# Patient Record
Sex: Female | Born: 1935 | Race: White | Hispanic: No | Marital: Married | Smoking: Never smoker
Health system: Southern US, Community
[De-identification: ages and names within clinical notes are randomized; demographics above are authoritative.]

## PROBLEM LIST (undated history)

## (undated) DIAGNOSIS — E7800 Pure hypercholesterolemia, unspecified: Secondary | ICD-10-CM

## (undated) DIAGNOSIS — E119 Type 2 diabetes mellitus without complications: Secondary | ICD-10-CM

## (undated) DIAGNOSIS — I10 Essential (primary) hypertension: Secondary | ICD-10-CM

## (undated) DIAGNOSIS — M1990 Unspecified osteoarthritis, unspecified site: Secondary | ICD-10-CM

## (undated) DIAGNOSIS — K219 Gastro-esophageal reflux disease without esophagitis: Secondary | ICD-10-CM

## (undated) DIAGNOSIS — C4491 Basal cell carcinoma of skin, unspecified: Secondary | ICD-10-CM

## (undated) DIAGNOSIS — N189 Chronic kidney disease, unspecified: Secondary | ICD-10-CM

## (undated) HISTORY — PX: ABDOMINAL HYSTERECTOMY: SHX81

## (undated) HISTORY — DX: Basal cell carcinoma of skin, unspecified: C44.91

## (undated) HISTORY — DX: Type 2 diabetes mellitus without complications: E11.9

## (undated) HISTORY — PX: TOTAL KNEE ARTHROPLASTY: SHX125

## (undated) HISTORY — PX: BACK SURGERY: SHX140

## (undated) HISTORY — PX: CHOLECYSTECTOMY: SHX55

## (undated) HISTORY — DX: Unspecified osteoarthritis, unspecified site: M19.90

## (undated) HISTORY — DX: Chronic kidney disease, unspecified: N18.9

## (undated) HISTORY — DX: Gastro-esophageal reflux disease without esophagitis: K21.9

---
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
## 1999-11-30 ENCOUNTER — Encounter: Admission: RE | Admit: 1999-11-30 | Discharge: 1999-11-30 | Payer: Self-pay | Admitting: Family Medicine

## 1999-11-30 ENCOUNTER — Encounter: Payer: Self-pay | Admitting: Family Medicine

## 1999-12-18 ENCOUNTER — Encounter: Payer: Self-pay | Admitting: *Deleted

## 1999-12-22 ENCOUNTER — Ambulatory Visit (HOSPITAL_COMMUNITY): Admission: RE | Admit: 1999-12-22 | Discharge: 1999-12-23 | Payer: Self-pay | Admitting: *Deleted

## 2000-03-07 ENCOUNTER — Encounter: Admission: RE | Admit: 2000-03-07 | Discharge: 2000-03-07 | Payer: Self-pay | Admitting: Obstetrics and Gynecology

## 2000-03-07 ENCOUNTER — Encounter: Payer: Self-pay | Admitting: Obstetrics and Gynecology

## 2001-03-08 ENCOUNTER — Encounter: Payer: Self-pay | Admitting: Obstetrics and Gynecology

## 2001-03-08 ENCOUNTER — Encounter: Admission: RE | Admit: 2001-03-08 | Discharge: 2001-03-08 | Payer: Self-pay | Admitting: Obstetrics and Gynecology

## 2002-02-07 ENCOUNTER — Encounter: Payer: Self-pay | Admitting: Neurosurgery

## 2002-02-07 ENCOUNTER — Ambulatory Visit (HOSPITAL_COMMUNITY): Admission: RE | Admit: 2002-02-07 | Discharge: 2002-02-07 | Payer: Self-pay | Admitting: Neurosurgery

## 2002-03-12 ENCOUNTER — Encounter: Admission: RE | Admit: 2002-03-12 | Discharge: 2002-03-12 | Payer: Self-pay | Admitting: Obstetrics and Gynecology

## 2002-03-12 ENCOUNTER — Encounter: Payer: Self-pay | Admitting: Obstetrics and Gynecology

## 2003-03-15 ENCOUNTER — Encounter: Payer: Self-pay | Admitting: Obstetrics and Gynecology

## 2003-03-15 ENCOUNTER — Encounter: Admission: RE | Admit: 2003-03-15 | Discharge: 2003-03-15 | Payer: Self-pay | Admitting: Obstetrics and Gynecology

## 2004-03-16 ENCOUNTER — Encounter: Admission: RE | Admit: 2004-03-16 | Discharge: 2004-03-16 | Payer: Self-pay | Admitting: Family Medicine

## 2005-03-17 ENCOUNTER — Encounter: Admission: RE | Admit: 2005-03-17 | Discharge: 2005-03-17 | Payer: Self-pay | Admitting: Family Medicine

## 2005-11-05 ENCOUNTER — Encounter: Admission: RE | Admit: 2005-11-05 | Discharge: 2005-11-05 | Payer: Self-pay | Admitting: Family Medicine

## 2006-03-28 ENCOUNTER — Encounter: Admission: RE | Admit: 2006-03-28 | Discharge: 2006-03-28 | Payer: Self-pay | Admitting: Family Medicine

## 2006-07-28 ENCOUNTER — Encounter: Admission: RE | Admit: 2006-07-28 | Discharge: 2006-07-28 | Payer: Self-pay | Admitting: Family Medicine

## 2006-10-03 ENCOUNTER — Inpatient Hospital Stay (HOSPITAL_COMMUNITY): Admission: RE | Admit: 2006-10-03 | Discharge: 2006-10-06 | Payer: Self-pay | Admitting: Orthopedic Surgery

## 2007-03-30 ENCOUNTER — Encounter: Admission: RE | Admit: 2007-03-30 | Discharge: 2007-03-30 | Payer: Self-pay | Admitting: Family Medicine

## 2008-02-22 HISTORY — PX: COLONOSCOPY: SHX174

## 2008-04-01 ENCOUNTER — Encounter: Admission: RE | Admit: 2008-04-01 | Discharge: 2008-04-01 | Payer: Self-pay | Admitting: Family Medicine

## 2008-04-27 ENCOUNTER — Encounter: Admission: RE | Admit: 2008-04-27 | Discharge: 2008-04-27 | Payer: Self-pay | Admitting: Neurosurgery

## 2009-01-29 ENCOUNTER — Inpatient Hospital Stay (HOSPITAL_COMMUNITY): Admission: RE | Admit: 2009-01-29 | Discharge: 2009-01-31 | Payer: Self-pay | Admitting: Neurosurgery

## 2009-04-03 ENCOUNTER — Encounter: Admission: RE | Admit: 2009-04-03 | Discharge: 2009-04-03 | Payer: Self-pay | Admitting: Obstetrics and Gynecology

## 2010-04-06 ENCOUNTER — Encounter: Admission: RE | Admit: 2010-04-06 | Discharge: 2010-04-06 | Payer: Self-pay | Admitting: Family Medicine

## 2010-10-25 LAB — URINALYSIS, ROUTINE W REFLEX MICROSCOPIC
Bilirubin Urine: NEGATIVE
Glucose, UA: NEGATIVE mg/dL
Hgb urine dipstick: NEGATIVE
Ketones, ur: NEGATIVE mg/dL
Nitrite: NEGATIVE
Protein, ur: NEGATIVE mg/dL
Specific Gravity, Urine: 1.016 (ref 1.005–1.030)
Urobilinogen, UA: 1 mg/dL (ref 0.0–1.0)
pH: 6 (ref 5.0–8.0)

## 2010-10-25 LAB — COMPREHENSIVE METABOLIC PANEL
ALT: 22 U/L (ref 0–35)
AST: 30 U/L (ref 0–37)
Albumin: 4 g/dL (ref 3.5–5.2)
Alkaline Phosphatase: 56 U/L (ref 39–117)
BUN: 26 mg/dL — ABNORMAL HIGH (ref 6–23)
CO2: 23 mEq/L (ref 19–32)
Calcium: 9.8 mg/dL (ref 8.4–10.5)
Chloride: 107 mEq/L (ref 96–112)
Creatinine, Ser: 1.51 mg/dL — ABNORMAL HIGH (ref 0.4–1.2)
GFR calc Af Amer: 41 mL/min — ABNORMAL LOW (ref >60)
GFR calc non Af Amer: 34 mL/min — ABNORMAL LOW (ref >60)
Glucose, Bld: 135 mg/dL — ABNORMAL HIGH (ref 70–99)
Potassium: 4.6 mEq/L (ref 3.5–5.1)
Sodium: 138 mEq/L (ref 135–145)
Total Bilirubin: 0.6 mg/dL (ref 0.3–1.2)
Total Protein: 6.6 g/dL (ref 6.0–8.3)

## 2010-10-25 LAB — CBC
HCT: 35 % — ABNORMAL LOW (ref 36.0–46.0)
Hemoglobin: 12.3 g/dL (ref 12.0–15.0)
MCHC: 35 g/dL (ref 30.0–36.0)
MCV: 92.4 fL (ref 78.0–100.0)
Platelets: 202 10*3/uL (ref 150–400)
RBC: 3.79 MIL/uL — ABNORMAL LOW (ref 3.87–5.11)
RDW: 12.7 % (ref 11.5–15.5)
WBC: 7 10*3/uL (ref 4.0–10.5)

## 2010-10-25 LAB — PROTIME-INR
INR: 0.9 (ref 0.00–1.49)
Prothrombin Time: 12.6 seconds (ref 11.6–15.2)

## 2010-10-25 LAB — BASIC METABOLIC PANEL
BUN: 30 mg/dL — ABNORMAL HIGH (ref 6–23)
CO2: 22 mEq/L (ref 19–32)
Calcium: 9.5 mg/dL (ref 8.4–10.5)
Chloride: 103 mEq/L (ref 96–112)
Creatinine, Ser: 1.45 mg/dL — ABNORMAL HIGH (ref 0.4–1.2)
GFR calc Af Amer: 43 mL/min — ABNORMAL LOW (ref >60)
GFR calc non Af Amer: 35 mL/min — ABNORMAL LOW (ref >60)
Glucose, Bld: 117 mg/dL — ABNORMAL HIGH (ref 70–99)
Potassium: 4.7 mEq/L (ref 3.5–5.1)
Sodium: 135 mEq/L (ref 135–145)

## 2010-10-25 LAB — DIFFERENTIAL
Basophils Absolute: 0 10*3/uL (ref 0.0–0.1)
Basophils Relative: 1 % (ref 0–1)
Eosinophils Absolute: 0.1 10*3/uL (ref 0.0–0.7)
Eosinophils Relative: 1 % (ref 0–5)
Lymphocytes Relative: 32 % (ref 12–46)
Lymphs Abs: 2.2 10*3/uL (ref 0.7–4.0)
Monocytes Absolute: 0.5 10*3/uL (ref 0.1–1.0)
Monocytes Relative: 8 % (ref 3–12)
Neutro Abs: 4.1 10*3/uL (ref 1.7–7.7)
Neutrophils Relative %: 58 % (ref 43–77)

## 2010-10-25 LAB — ABO/RH: ABO/RH(D): O POS

## 2010-10-25 LAB — TYPE AND SCREEN
ABO/RH(D): O POS
Antibody Screen: NEGATIVE

## 2010-10-25 LAB — APTT: aPTT: 29 seconds (ref 24–37)

## 2010-12-01 NOTE — Op Note (Signed)
NAMECJ, Tabitha Black                 ACCOUNT NO.:  [id number]   MEDICAL RECORD NO.:  [id number]          PATIENT TYPE:  INP   LOCATION:  2899                         FACILITY:  [facility]   PHYSICIAN:  Elizabeth Sauer, M.D.      DATE OF BIRTH:  [birth year]   DATE OF PROCEDURE:  01/29/2009  DATE OF DISCHARGE:                               OPERATIVE REPORT   PREOPERATIVE DIAGNOSES:  Spondylosis and spinal stenosis, L3-4.   POSTOPERATIVE DIAGNOSES:  Spondylosis and spinal stenosis, L3-4.   OPERATIVE PROCEDURE:  L3-4 anterolateral interbody fusion with XLIF  technique and lateral plate.   SURGEON:  Elizabeth Sauer, MD, Neurosurgery.   ANESTHESIA:  General endotracheal.   PREPARATION:  Prepped and scrubbed with alcohol wipe.   COMPLICATIONS:  None.   NURSE ASSISTANT:  Kasik.   DOCTOR ASSISTANT:  Leeroy Cha, MD   BODY OF TEXT:  A 77 year old lady with adjacent segment disease at L3-4,  taken to operating room, smoothly sized intubated, placed right side and  down left side up in lateral position.  The patient was secured to the  table with table flexed and the patient flexed laterally.  Good view of  the L3-4 interspace was obtained.  Following shave, prep and drape in  usual sterile fashion, 2 lateral incisions were made, 1 directly lateral  of the L3-4 disk space and 1 slightly posterior, through these the  retroperitoneal space was accessed.  The posterior wound was used for  finger palpation down to the transverse process, the far lateral one was  used as a working channel through which the electrode was placed.  The  docking site was selected, EMG monitoring was undertaken and no elements  of the plexus were encountered.  Successive dilation was carried out to  the placement of a large retractor, which was secured in place with arm.  Light sources were placed and retractor opened transversely in a  cephalocaudal direction.  The psoas muscle was gently split and pushed  out of the way.   Disk was encountered, the shim placed.  Diskectomy was  carried out under gross observation and fluoroscopic observation.  Lateral recess on the right side was obtained with Cobb curettes.  Following complete diskectomy and preparation of endplate, an 8 x 55-mm  implant was placed with Osteocel.  Screws or bolts were placed under  fluoro control into this, a lateral plate was attached and secured  with locking nuts.  Films showed good placement of all elements.  The  wound was irrigated.  Hemostasis assured.  Successive layers of 0  Vicryl, 2-0 Vicryl, and 3-0 nylon were used to close each incision.  The  patient was then placed, returned recovery room in good condition.           ______________________________  Elizabeth Sauer, M.D.     MWR/MEDQ  D:  01/29/2009  T:  01/30/2009  Job:  [id number]

## 2010-12-01 NOTE — H&P (Signed)
Tabitha Black, Tabitha Black                 ACCOUNT NO.:  [id number]   MEDICAL RECORD NO.:  [id number]          PATIENT TYPE:  INP   LOCATION:  3039                         FACILITY:  [facility]   PHYSICIAN:  Elizabeth Sauer, M.D.      DATE OF BIRTH:  [birth year]   DATE OF ADMISSION:  01/29/2009  DATE OF DISCHARGE:                              HISTORY & PHYSICAL   ADMISSION DIAGNOSES:  Spondylosis and adjacent segment disease at L3-4.   BODY TEXT:  A very nice 77 year old right-handed white lady who had  lumbar fusion about 10 years ago at L4-L5, did well, has developed  increasing pain in her back.  MR demonstrates significant adjacent  segment disease at L3-4, and she is now admitted for anterolateral  interbody fusion using XLIF technique.   MEDICAL HISTORY:  Remarkable for hypertension.   ALLERGIES:  She is allergic to PENICILLIN and TOPICAL MYCINS.   MEDICATIONS:  She is on several antihypertensive medications.   Surgical history has been her back.  She has also had her foot and  hysterectomy.   SOCIAL HISTORY:  She does not smoke, does not drink.  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   FAMILY HISTORY:  Noncontributory.   REVIEW OF SYSTEMS:  Remarkable for back and leg pain.   PHYSICAL EXAMINATION:  HEENT:  Within normal limits.  NECK:  She had good range of motion in her neck.  CHEST:  Clear.  CARDIAC:  Regular rate and rhythm.  NEUROLOGIC:  She is awake, alert, and oriented.  Cranial nerves are  intact.  Motor exam shows 5/5 strength throughout the upper and lower  extremities.  She develops subjective weakness in her legs when she has  been up on them  walking.  Reflexes are flicker at the knees, absent at  the ankles.   MR shows significant spondylotic disease at L3-4.   CLINICAL IMPRESSION:  Symptomatic adjacent segment disease.   The plan is for an anterolateral interbody fusion using an XLIF  technique.  The risks and benefits have been discussed with her, and she  wishes to proceed.           ______________________________  Elizabeth Sauer, M.D.     MWR/MEDQ  D:  01/29/2009  T:  01/30/2009  Job:  [id number]

## 2010-12-04 NOTE — Discharge Summary (Signed)
NAMESIYANA, TINKLENBERG                 ACCOUNT NO.:  [id number]   MEDICAL RECORD NO.:  [id number]          PATIENT TYPE:  INP   LOCATION:  1519                         FACILITY:  [facility]   PHYSICIAN:  [name], M.D.    DATE OF BIRTH:  October 04, 1932   DATE OF ADMISSION:  10/03/2006  DATE OF DISCHARGE:  10/06/2006                               DISCHARGE SUMMARY   ADMITTING DIAGNOSES:  1. Osteoarthritis, right knee.  2. Hypertension.  3. Hypercholesterolemia.  4. Reflux disease.  5. Postmenopausal.   DISCHARGE DIAGNOSES:  1. Osteoarthritis of right knee, status post right total knee      arthroplasty.  2. Acute blood loss anemia, did not require transfusion.  3. Hypertension.  4. Hypercholesterolemia.  5. Reflux disease.  6. Postmenopausal.   PROCEDURE:  October 03, 2006, right total knee; surgeon, Dr. Wynelle Link;  assistant, Arlee Muslim, PA-C; anesthesia, general.   CONSULTS:  None.   BRIEF HISTORY:  Ms. Steve is a 77 year old female with end-stage arthritis  of her right knee with intractable pain.  She has failed nonoperative  management and now presents for a total knee arthroplasty.   LABORATORY DATA:  Preop CBC showed a hemoglobin of 12.8, hematocrit of  36.9, white count of 8.1.  Postop hemoglobin 9.9 and drifted down to  9.3, last noted H&H 9.2 and 26.4.  PT and PTT preop 12.6 and 27,  respectively.  INR 0.9.  Serial pro times were followed, last noted  PT/INR 18.1 and 1.4.  Chemistry panel on admission all within normal  limits.  Serial BMETs were followed.  Her electrolytes remained within  normal limits.  Urinalysis was negative.  Blood group type O positive.   EKG, September 27, 2006:  Normal sinus rhythm, nonspecific T-wave  abnormalities, confirmed by Dr. Minus Breeding.   Two-view chest, September 27, 2006:  No active disease   HOSPITAL COURSE:  The patient was admitted to [facility] and  tolerated procedure well, later transferred to recovery room and the  orthopedic floor, started on PCA and p.o. analgesic for pain control  following surgery.  She had a rough night on the evening of surgery.  By  the morning of day #1, was doing a little bit better.  Pain was still a  fair amount, encouraged p.o. medications.  Hemoglobin was down to 9.9,  but she was asymptomatic.  Hemovac drain was pulled at time of surgery.  She was started back on her home medications.  Blood pressure looked  good.  She was started back on her cholesterol medications.  She started  getting up out of bed with therapy.  By day #2, she was doing a little  bit better.  Pain went up, but pain was under better control.  Hemoglobin drifted down to 9.3.  She was still asymptomatic.  Dressing  was changed and incision looked good.  From a therapy standpoint, she  started getting up and doing a little bit better.  She was doing about  15 feet, but did get 25 feet of mobility, weaned over to p.o.  medications, PCA  and IVs were discontinued.  By day #3, the patient was  feeling much better.  Her back was sore, but the knee was feeling  better.  She was getting up and walking 65 and 80 feet.  Wound continued  to heal well.  She was progressing well and it was felt she would be  ready to go home.  She was discharged home later that day.   DISCHARGE PLAN:  1. The patient was discharged home on October 06, 2006.  2. Discharge diagnoses:  Please see above.  3. Discharge medications:  Percocet, Robaxin, Coumadin.  She is also      given Lovenox for 2 more days until the INR is therapeutic.  Resume      home medications.  4. Diet:  Heart-healthy, low-cholesterol diet.   ACTIVITY:  Weightbearing as tolerated, right lower extremity.  Gait  training for ambulation and ADLs.  Home health PT and home health  nursing.   FOLLOWUP:  Follow up in 2 weeks.   DISPOSITION:  Home.   CONDITION UPON DISCHARGE:  Improved.      Alexzandrew L. Dara Lords, P.A.      [name], M.D.   Electronically Signed    ALP/MEDQ  D:  10/28/2006  T:  10/28/2006  Job:  [id number]   cc:   Modena Jansky. Marisue Humble, M.D.  [id number]   Oneida Arenas MD

## 2010-12-04 NOTE — Op Note (Signed)
[place]  Patient:    Tabitha Black, Tabitha Black                        MRN: [id number] Proc. Date: 12/22/99 Adm. Date:  [id number] Disc. Date: [id number] Attending:  Lucianne Muss CC:         Cleotis Nipper, M.D.             Thomas B. March Rummage, M.D.                           Operative Report  PREOPERATIVE DIAGNOSES: 1. Chronic cholecystitis with cholelithiasis. 2. Deformed left big toenail.  POSTOPERATIVE DIAGNOSES: 1. Chronic cholecystitis with cholelithiasis. 2. Deformed left big toenail.  OPERATION: 1. Laparoscopic cholecystectomy. 2. Avulsion of left big toenail.  SURGEON:  Marcello Moores B. March Rummage, M.D.  ASSISTANT:  Ward Givens, M.D.  ANESTHESIA:  General.  ANESTHESIOLOGIST:  CRNA.  DESCRIPTION OF PROCEDURE:  The patient was taken to the operating room and placed on the table in the supine position.  After satisfactory general anesthetic with intubation, the entire abdomen was prepped and draped as a sterile field.  A small vertical infraumbilical incision was made through skin and subcutaneous tissue and midline fascia.  A finger was placed into the peritoneal cavity, and there were noted to be some adhesions in the left lower quadrant of the abdomen where the omentum was adherent to the anterior abdominal wall.  A pursestring suture of 0 Vicryl was placed.  The Hasson trocar was placed in the abdomen, and the abdomen was then insufflated to 14 mmHg pressure.  A second 10 mm trocar was placed just to the right of midline in the subxiphoid area.  Two 5 mm trocars were placed laterally.  The gallbladder was placed on traction.  The cystic duct was readily identified. The cystic duct appeared normal.  The cystic duct was doubly clipped on the remaining side, once on the gallbladder side, and divided.  The cystic artery was then exposed.  The cystic artery was doubly clipped on the remaining side, once on the gallbladder side, and  divided.  After this was divided, there was another small artery just posteriorly to this, which was also doubly clipped. This was irrigated and hemostasis obtained.  The gallbladder was then dissected from the bed using the cautery.  The gallbladder bed was irrigated and no bleeding was noted.  Because there had been slight ooze near the cystic artery, a small piece of Surgicel was placed, but there was no evidence of bleeding when the Surgicel was placed.  The gallbladder was then removed through the infraumbilical incision.  It was necessary to open the gallbladder and remove some large stones before it was passed through the fascia.  The pursestring suture at the infraumbilical incision was then tied to get good closure.  The two lateral trocars were removed under direct vision.  There was some slight bleeding from the upper trocar site, and it was cauterized as the trocar was removed.  Right upper quadrant fluid had previously been suctioned and was clear.  The abdomen was then deflated and the second 10 mm trocar removed.  Marcaine 0.25% without epinephrine had been injected in all trocar sites.  The two midline incisions were closed with running subcuticular sutures of 5-0 Vicryl, and the two lateral trocar sites were closed with single inverted simple 5-0 Vicryl sutures.  Benzoin and quarter-inch  Steri-Strips were applied by all wounds.  Dry sterile dressings were applied.  Attention was then directed to the left big toenail.  The toenail was prepped with Betadine.  It was partially loosed, and the nail was avulsed.  Some extra fibrous tissue toward the end of the nail bed was debrided.  No attempt was made to destroy the nail bed.  Surgicel was placed over the nail bed, which was really not bleeding.  The Surgicel was held in place with a two-inch Kerlix.  A dry sterile dressing was applied.  The patient seemed to tolerate both procedures well and was taken to the PACU in  satisfactory condition. DD:  12/22/99 TD:  12/24/99 Job: [id number]

## 2010-12-04 NOTE — Op Note (Signed)
[name], [name]                 ACCOUNT NO.:  [id number]   MEDICAL RECORD NO.:  [id number]          PATIENT TYPE:  INP   LOCATION:  1519                         FACILITY:  [facility]   PHYSICIAN:  [name], M.D.    DATE OF BIRTH:  Feb 27, 1933   DATE OF PROCEDURE:  10/03/2006  DATE OF DISCHARGE:                               OPERATIVE REPORT   PREOPERATIVE DIAGNOSIS:  Osteoarthritis right knee.   POSTOPERATIVE DIAGNOSIS:  Osteoarthritis right knee.   PROCEDURE:  Right total knee arthroplasty.   SURGEON:  Aluisio.   ASSISTANT:  Arlee Muslim, P.A.-C.   ANESTHESIA:  General with postop Marcaine pain pump.   ESTIMATED BLOOD LOSS:  Minimal.   DRAIN:  Hemovac x1.   TOURNIQUET:  Times 40 minutes at 300 mmHg.   COMPLICATIONS:  None.   CONDITION:  Stable to recovery.   BRIEF CLINICAL NOTE:  [name] is a 77 year old female who has end-stage  arthritis of the right knee with intractable pain.  She has failed  nonoperative management and presents now for a total knee arthroplasty.   PROCEDURE IN DETAIL:  After successful administration of general  anesthetic, a tourniquet is placed high on her right thigh and her right  lower extremity prepped and draped in the usual sterile fashion.  Extremity was wrapped in Esmarch, knee flexed, tourniquet inflated to  300 mmHg.  A midline incision was made with a 10 blade through the  subcutaneous tissue to the level of the extensor mechanism.  A fresh  blade was used to make a medial parapatellar arthrotomy.  The soft  tissue over the proximal medial tibia subperiosteally elevated to the  joint line with a knife, into the semimembranous bursa with a Cobb  elevator.  Soft tissue laterally was elevated with attention being paid  to avoid the patellar tendon on tibial tubercle.  Patella subluxed  laterally, knee flexed 90 degrees.  The ACL and PCL were removed.  A  drill was used to create a starting hole on the distal femur, canal was  thoroughly irrigated.  A 5 degree right valgus alignment guide is  placed, then referencing off the posterior condyle the rotation is  marked and the block pinned to remove 10 mm off the distal femur.  A  distal femoral resection is made with an oscillating saw.  A sizing  block is placed and size 3 is most appropriate.  Rotation is marked at  the epicondylar axis.  A size 3 cutting block is placed then the  anterior, posterior and Chamfer cuts made.   The tibia is subluxed forward and the menisci are removed.  The  extramedullary tibial alignment guide is placed, referencing proximally  at the medial aspect of the tibial tubercle and distally along the  second metatarsal axis and tibial crest and the block pinned to remove  about 10 mm from the less sufficient lateral side.  A tibial resection  is made with an oscillating saw.  Spacer blocks are placed and she has a  good rectangular flexion and extension gap at 10 mm.  The proximal  tibia  is then prepared with the modular drill and keel punch for a size 3 base  plate.  The intercondylar cut on the femur is then completed for size 3.   A size 3 mobile bearing tibial trial, size 3 posterior stabilized  femoral trial and a 10 mm posterior stabilized rotating platform insert  and trial are placed.  With a 10, full extension is achieved with  excellent varus and valgus balance throughout full range of motion.  The  patella was everted and thickness measured to be 23 mm.  Free hand  resection is taken to 13 mm, a 38 template is placed, lug holes are  drilled, a trial patella is placed and it tracks normally.  Osteophytes  are removed off the posterior femur with the trial in place.  All trials  are removed and the cut bone surfaces prepared with pulsatile lavage.  Cement is mixed and once ready for implantation a size 3 mobile bearing  tibial tray, a size 3 posterior stabilized femur and 38 patella are  cemented into place, the patella is  held with the clamp.  A trial 10 mm  insert is placed, knee held in full extension and all extruded cement  removed.  Once the cement is fully hardened then a permanent 10 mm  posterior stabilized rotating platform insert is placed into the tibial  tray.  The wound was copiously irrigated with saline solution and the  extensor mechanism closed over a Hemovac drain with interrupted #1 PDS.  Flexion against gravity is 135 degrees.  The tourniquet is then released  at a total time of 40 minutes.  The subcu is closed with interrupted #2-  0 Vicryl, subcuticular running #4-0 Monocryl.  Incisions cleaned and  dried and Steri-Strips applied.  The catheter for the Marcaine pain pump  is placed and the pump is initiated.  Hemovac drain is hooked to  suction.  Bulky sterile dressings applied and she was placed into a knee  immobilizer, awakened and transported to Recovery in stable condition.      [name], M.D.  Electronically Signed     FA/MEDQ  D:  10/03/2006  T:  10/03/2006  Job:  [id number]

## 2010-12-04 NOTE — H&P (Signed)
Tabitha Black, Tabitha Black                 ACCOUNT NO.:  [id number]   MEDICAL RECORD NO.:  [id number]          PATIENT TYPE:  INP   LOCATION:  0004                         FACILITY:  [facility]   PHYSICIAN:  Tabitha Black, M.D.    DATE OF BIRTH:  1932/11/02   DATE OF ADMISSION:  10/03/2006  DATE OF DISCHARGE:                              HISTORY & PHYSICAL   DATE OF OFFICE VISIT HISTORY AND PHYSICAL:  September 27, 2006   CHIEF COMPLAINT:  Right knee pain.   HISTORY OF PRESENT ILLNESS:  The patient is a 77 year old female who has  been seen by Dr. Wynelle Black for ongoing problems related to her right knee.  She has been treated in the past for a stress fracture.  Unfortunately,  the underlying arthritis in her knee has progressively gotten worse.  She was seen by Dr. Wynelle Black for a second opinion.  She is found to have  significant medial compartment arthritis with bone-on-bone and also  patellofemoral arthritis with some spurring.  Due to the level of  advanced arthritis it is felt she could benefit undergoing knee  replacement.  Risks and benefits have been discussed and she elected to  proceed with surgery.   ALLERGIES:  1. PENICILLIN.  2. TOPICAL MYCINS in the form of cream.   CURRENT MEDICATIONS:  Metoprolol, triamterene/hydrochlorothiazide,  lisinopril, estradiol, Niaspan, lovastatin, baby aspirin.   PAST MEDICAL HISTORY:  1. Hypertension.  2. Reflux.  3. Hypercholesterolemia.  4. Postmenopausal.   PAST SURGICAL HISTORY:  Surgery for a slipped disc; also gallbladder  removal.   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  Nonsmoker, no  alcohol.  Two children.  Husband will be assisting with care after  surgery.  She has three steps entering onto the porch, one step into the  house.   FAMILY HISTORY:  Mother with a borderline diabetes and arthritis.  Father with a history of arthritis.   REVIEW OF SYSTEMS:  GENERAL:  No fevers, chills, night sweats.  NEUROLOGIC:  No  seizures, syncope, paralysis.  RESPIRATORY:  No  shortness of breath, productive cough or hemoptysis.  CARDIOVASCULAR:  No chest pain, angina, orthopnea.  GI:  No nausea, vomiting, diarrhea,  constipation.  GU:  No dysuria, hematuria, discharge.  MUSCULOSKELETAL:  Right knee.   PHYSICAL EXAMINATION:  VITAL SIGNS:  Pulse 76, respirations 12, blood  pressure 160/86.  GENERAL:  A 77 year old white female, well-nourished, well-developed, no  acute distress.  Short stature, overweight.  Alert and oriented,  cooperative, pleasant at the time of exam.  HEENT:  Normocephalic, atraumatic.  Pupils are round and reactive.  Oropharynx clear.  EOMs intact.  NECK:  Supple.  CHEST:  Clear.  HEART:  Regular rate and rhythm without murmur.  S1, S2 noted.  ABDOMEN:  Soft, round, protuberant.  Active bowel sounds present.  RECTAL, BREAST, GENITALIA:  Not done, not pertinent to present illness.  EXTREMITIES:  Right knee shows slight varus malalignment deformity.  Range of motion 5-115.  No effusion, no instability, antalgic gait.   IMPRESSION:  1. Osteoarthritis right knee.  2. Hypertension.  3. Hypercholesterolemia.  4. Reflux disease.  5. Postmenopausal.   PLAN:  The patient will be admitted to [facility] to undergo a  right total knee arthroplasty.  Surgery will be performed by Dr. Gaynelle Black.      Tabitha Black, P.A.      Tabitha Black, M.D.  Electronically Signed    ALP/MEDQ  D:  10/02/2006  T:  10/03/2006  Job:  [id number]   cc:   Tabitha Black, M.D.  Fax: [phone]   Tabitha Black, M.D.  Fax: [phone]

## 2011-03-01 ENCOUNTER — Other Ambulatory Visit: Payer: Self-pay | Admitting: Family Medicine

## 2011-03-01 DIAGNOSIS — Z1231 Encounter for screening mammogram for malignant neoplasm of breast: Secondary | ICD-10-CM

## 2011-04-08 ENCOUNTER — Ambulatory Visit
Admission: RE | Admit: 2011-04-08 | Discharge: 2011-04-08 | Disposition: A | Discharge status: Home / Self Care | Payer: Medicare Other | Source: Ambulatory Visit | Attending: Family Medicine | Admitting: Family Medicine

## 2011-04-08 DIAGNOSIS — Z1231 Encounter for screening mammogram for malignant neoplasm of breast: Secondary | ICD-10-CM

## 2011-08-02 DIAGNOSIS — I10 Essential (primary) hypertension: Secondary | ICD-10-CM | POA: Diagnosis not present

## 2011-08-02 DIAGNOSIS — Z79899 Other long term (current) drug therapy: Secondary | ICD-10-CM | POA: Diagnosis not present

## 2011-08-02 DIAGNOSIS — E782 Mixed hyperlipidemia: Secondary | ICD-10-CM | POA: Diagnosis not present

## 2011-08-02 DIAGNOSIS — M542 Cervicalgia: Secondary | ICD-10-CM | POA: Diagnosis not present

## 2011-08-02 DIAGNOSIS — K219 Gastro-esophageal reflux disease without esophagitis: Secondary | ICD-10-CM | POA: Diagnosis not present

## 2011-08-02 DIAGNOSIS — Z23 Encounter for immunization: Secondary | ICD-10-CM | POA: Diagnosis not present

## 2011-08-02 DIAGNOSIS — Z0000 Encounter for general adult medical examination without abnormal findings: Secondary | ICD-10-CM | POA: Diagnosis not present

## 2011-08-10 DIAGNOSIS — Z1212 Encounter for screening for malignant neoplasm of rectum: Secondary | ICD-10-CM | POA: Diagnosis not present

## 2011-10-14 DIAGNOSIS — M47817 Spondylosis without myelopathy or radiculopathy, lumbosacral region: Secondary | ICD-10-CM | POA: Diagnosis not present

## 2012-02-01 DIAGNOSIS — I10 Essential (primary) hypertension: Secondary | ICD-10-CM | POA: Diagnosis not present

## 2012-02-01 DIAGNOSIS — E782 Mixed hyperlipidemia: Secondary | ICD-10-CM | POA: Diagnosis not present

## 2012-02-01 DIAGNOSIS — Z79899 Other long term (current) drug therapy: Secondary | ICD-10-CM | POA: Diagnosis not present

## 2012-02-01 DIAGNOSIS — K219 Gastro-esophageal reflux disease without esophagitis: Secondary | ICD-10-CM | POA: Diagnosis not present

## 2012-02-01 DIAGNOSIS — M1990 Unspecified osteoarthritis, unspecified site: Secondary | ICD-10-CM | POA: Diagnosis not present

## 2012-02-29 ENCOUNTER — Other Ambulatory Visit: Payer: Self-pay | Admitting: Family Medicine

## 2012-02-29 DIAGNOSIS — Z1231 Encounter for screening mammogram for malignant neoplasm of breast: Secondary | ICD-10-CM

## 2012-03-07 DIAGNOSIS — H2510 Age-related nuclear cataract, unspecified eye: Secondary | ICD-10-CM | POA: Diagnosis not present

## 2012-03-07 DIAGNOSIS — H25019 Cortical age-related cataract, unspecified eye: Secondary | ICD-10-CM | POA: Diagnosis not present

## 2012-04-10 ENCOUNTER — Ambulatory Visit
Admission: RE | Admit: 2012-04-10 | Discharge: 2012-04-10 | Disposition: A | Discharge status: Home / Self Care | Payer: Medicare Other | Source: Ambulatory Visit | Attending: Family Medicine | Admitting: Family Medicine

## 2012-04-10 DIAGNOSIS — Z1231 Encounter for screening mammogram for malignant neoplasm of breast: Secondary | ICD-10-CM

## 2012-04-19 DIAGNOSIS — Z23 Encounter for immunization: Secondary | ICD-10-CM | POA: Diagnosis not present

## 2012-05-10 DIAGNOSIS — L570 Actinic keratosis: Secondary | ICD-10-CM | POA: Diagnosis not present

## 2012-05-23 DIAGNOSIS — B079 Viral wart, unspecified: Secondary | ICD-10-CM | POA: Diagnosis not present

## 2012-05-23 DIAGNOSIS — D485 Neoplasm of uncertain behavior of skin: Secondary | ICD-10-CM | POA: Diagnosis not present

## 2012-05-23 DIAGNOSIS — L570 Actinic keratosis: Secondary | ICD-10-CM | POA: Diagnosis not present

## 2012-05-30 DIAGNOSIS — M47817 Spondylosis without myelopathy or radiculopathy, lumbosacral region: Secondary | ICD-10-CM | POA: Diagnosis not present

## 2012-06-29 DIAGNOSIS — Z01419 Encounter for gynecological examination (general) (routine) without abnormal findings: Secondary | ICD-10-CM | POA: Diagnosis not present

## 2012-06-29 DIAGNOSIS — Z124 Encounter for screening for malignant neoplasm of cervix: Secondary | ICD-10-CM | POA: Diagnosis not present

## 2012-06-29 DIAGNOSIS — N951 Menopausal and female climacteric states: Secondary | ICD-10-CM | POA: Diagnosis not present

## 2012-08-07 DIAGNOSIS — Z0000 Encounter for general adult medical examination without abnormal findings: Secondary | ICD-10-CM | POA: Diagnosis not present

## 2012-08-07 DIAGNOSIS — E782 Mixed hyperlipidemia: Secondary | ICD-10-CM | POA: Diagnosis not present

## 2012-08-07 DIAGNOSIS — Z1331 Encounter for screening for depression: Secondary | ICD-10-CM | POA: Diagnosis not present

## 2012-08-07 DIAGNOSIS — M1990 Unspecified osteoarthritis, unspecified site: Secondary | ICD-10-CM | POA: Diagnosis not present

## 2012-08-07 DIAGNOSIS — K219 Gastro-esophageal reflux disease without esophagitis: Secondary | ICD-10-CM | POA: Diagnosis not present

## 2012-08-07 DIAGNOSIS — Z79899 Other long term (current) drug therapy: Secondary | ICD-10-CM | POA: Diagnosis not present

## 2012-08-07 DIAGNOSIS — I10 Essential (primary) hypertension: Secondary | ICD-10-CM | POA: Diagnosis not present

## 2012-08-07 DIAGNOSIS — N183 Chronic kidney disease, stage 3 unspecified: Secondary | ICD-10-CM | POA: Diagnosis not present

## 2012-09-05 DIAGNOSIS — K111 Hypertrophy of salivary gland: Secondary | ICD-10-CM | POA: Diagnosis not present

## 2012-11-28 DIAGNOSIS — M47817 Spondylosis without myelopathy or radiculopathy, lumbosacral region: Secondary | ICD-10-CM | POA: Diagnosis not present

## 2013-01-10 DIAGNOSIS — R21 Rash and other nonspecific skin eruption: Secondary | ICD-10-CM | POA: Diagnosis not present

## 2013-02-06 DIAGNOSIS — E782 Mixed hyperlipidemia: Secondary | ICD-10-CM | POA: Diagnosis not present

## 2013-02-06 DIAGNOSIS — Z79899 Other long term (current) drug therapy: Secondary | ICD-10-CM | POA: Diagnosis not present

## 2013-02-06 DIAGNOSIS — I10 Essential (primary) hypertension: Secondary | ICD-10-CM | POA: Diagnosis not present

## 2013-02-06 DIAGNOSIS — C44519 Basal cell carcinoma of skin of other part of trunk: Secondary | ICD-10-CM | POA: Diagnosis not present

## 2013-02-06 DIAGNOSIS — K219 Gastro-esophageal reflux disease without esophagitis: Secondary | ICD-10-CM | POA: Diagnosis not present

## 2013-02-06 DIAGNOSIS — C44319 Basal cell carcinoma of skin of other parts of face: Secondary | ICD-10-CM | POA: Diagnosis not present

## 2013-02-06 DIAGNOSIS — N183 Chronic kidney disease, stage 3 unspecified: Secondary | ICD-10-CM | POA: Diagnosis not present

## 2013-02-06 DIAGNOSIS — D485 Neoplasm of uncertain behavior of skin: Secondary | ICD-10-CM | POA: Diagnosis not present

## 2013-02-26 DIAGNOSIS — Z86010 Personal history of colonic polyps: Secondary | ICD-10-CM | POA: Diagnosis not present

## 2013-02-26 DIAGNOSIS — Z09 Encounter for follow-up examination after completed treatment for conditions other than malignant neoplasm: Secondary | ICD-10-CM | POA: Diagnosis not present

## 2013-02-26 DIAGNOSIS — K648 Other hemorrhoids: Secondary | ICD-10-CM | POA: Diagnosis not present

## 2013-02-26 DIAGNOSIS — D126 Benign neoplasm of colon, unspecified: Secondary | ICD-10-CM | POA: Diagnosis not present

## 2013-03-05 ENCOUNTER — Other Ambulatory Visit: Payer: Self-pay

## 2013-03-05 DIAGNOSIS — Z1231 Encounter for screening mammogram for malignant neoplasm of breast: Secondary | ICD-10-CM

## 2013-03-07 DIAGNOSIS — C44319 Basal cell carcinoma of skin of other parts of face: Secondary | ICD-10-CM | POA: Diagnosis not present

## 2013-03-20 DIAGNOSIS — H2510 Age-related nuclear cataract, unspecified eye: Secondary | ICD-10-CM | POA: Diagnosis not present

## 2013-04-05 DIAGNOSIS — C44319 Basal cell carcinoma of skin of other parts of face: Secondary | ICD-10-CM | POA: Diagnosis not present

## 2013-04-11 ENCOUNTER — Ambulatory Visit
Admission: RE | Admit: 2013-04-11 | Discharge: 2013-04-11 | Disposition: A | Discharge status: Home / Self Care | Payer: Medicare Other | Source: Ambulatory Visit

## 2013-04-11 DIAGNOSIS — Z1231 Encounter for screening mammogram for malignant neoplasm of breast: Secondary | ICD-10-CM | POA: Diagnosis not present

## 2013-04-21 DIAGNOSIS — Z23 Encounter for immunization: Secondary | ICD-10-CM | POA: Diagnosis not present

## 2013-05-08 DIAGNOSIS — E782 Mixed hyperlipidemia: Secondary | ICD-10-CM | POA: Diagnosis not present

## 2013-05-28 DIAGNOSIS — C44319 Basal cell carcinoma of skin of other parts of face: Secondary | ICD-10-CM | POA: Diagnosis not present

## 2013-05-28 DIAGNOSIS — C44519 Basal cell carcinoma of skin of other part of trunk: Secondary | ICD-10-CM | POA: Diagnosis not present

## 2013-06-08 DIAGNOSIS — M47817 Spondylosis without myelopathy or radiculopathy, lumbosacral region: Secondary | ICD-10-CM | POA: Diagnosis not present

## 2013-06-12 ENCOUNTER — Other Ambulatory Visit: Payer: Self-pay | Admitting: Neurosurgery

## 2013-06-12 DIAGNOSIS — M47817 Spondylosis without myelopathy or radiculopathy, lumbosacral region: Secondary | ICD-10-CM

## 2013-06-26 ENCOUNTER — Ambulatory Visit
Admission: RE | Admit: 2013-06-26 | Discharge: 2013-06-26 | Disposition: A | Discharge status: Home / Self Care | Payer: Medicare Other | Source: Ambulatory Visit | Attending: Neurosurgery | Admitting: Neurosurgery

## 2013-06-26 DIAGNOSIS — M47817 Spondylosis without myelopathy or radiculopathy, lumbosacral region: Secondary | ICD-10-CM | POA: Diagnosis not present

## 2013-07-05 DIAGNOSIS — M47817 Spondylosis without myelopathy or radiculopathy, lumbosacral region: Secondary | ICD-10-CM | POA: Diagnosis not present

## 2013-08-03 DIAGNOSIS — Z79899 Other long term (current) drug therapy: Secondary | ICD-10-CM | POA: Diagnosis not present

## 2013-08-03 DIAGNOSIS — E782 Mixed hyperlipidemia: Secondary | ICD-10-CM | POA: Diagnosis not present

## 2013-08-03 DIAGNOSIS — I129 Hypertensive chronic kidney disease with stage 1 through stage 4 chronic kidney disease, or unspecified chronic kidney disease: Secondary | ICD-10-CM | POA: Diagnosis not present

## 2013-08-03 DIAGNOSIS — N183 Chronic kidney disease, stage 3 unspecified: Secondary | ICD-10-CM | POA: Diagnosis not present

## 2013-08-21 DIAGNOSIS — Z23 Encounter for immunization: Secondary | ICD-10-CM | POA: Diagnosis not present

## 2013-10-04 DIAGNOSIS — N183 Chronic kidney disease, stage 3 unspecified: Secondary | ICD-10-CM | POA: Diagnosis not present

## 2013-10-08 DIAGNOSIS — N183 Chronic kidney disease, stage 3 unspecified: Secondary | ICD-10-CM | POA: Diagnosis not present

## 2013-10-10 ENCOUNTER — Other Ambulatory Visit: Payer: Self-pay | Admitting: Family Medicine

## 2013-10-10 DIAGNOSIS — N183 Chronic kidney disease, stage 3 unspecified: Secondary | ICD-10-CM | POA: Diagnosis not present

## 2013-10-10 DIAGNOSIS — N289 Disorder of kidney and ureter, unspecified: Secondary | ICD-10-CM

## 2013-10-11 ENCOUNTER — Ambulatory Visit
Admission: RE | Admit: 2013-10-11 | Discharge: 2013-10-11 | Disposition: A | Discharge status: Home / Self Care | Payer: Medicare Other | Source: Ambulatory Visit | Attending: Family Medicine | Admitting: Family Medicine

## 2013-10-11 DIAGNOSIS — N281 Cyst of kidney, acquired: Secondary | ICD-10-CM | POA: Diagnosis not present

## 2013-10-11 DIAGNOSIS — N289 Disorder of kidney and ureter, unspecified: Secondary | ICD-10-CM

## 2014-02-01 DIAGNOSIS — I129 Hypertensive chronic kidney disease with stage 1 through stage 4 chronic kidney disease, or unspecified chronic kidney disease: Secondary | ICD-10-CM | POA: Diagnosis not present

## 2014-02-01 DIAGNOSIS — N183 Chronic kidney disease, stage 3 unspecified: Secondary | ICD-10-CM | POA: Diagnosis not present

## 2014-02-01 DIAGNOSIS — E782 Mixed hyperlipidemia: Secondary | ICD-10-CM | POA: Diagnosis not present

## 2014-02-01 DIAGNOSIS — Z1331 Encounter for screening for depression: Secondary | ICD-10-CM | POA: Diagnosis not present

## 2014-02-21 DIAGNOSIS — M47817 Spondylosis without myelopathy or radiculopathy, lumbosacral region: Secondary | ICD-10-CM | POA: Diagnosis not present

## 2014-03-04 ENCOUNTER — Other Ambulatory Visit: Payer: Self-pay

## 2014-03-04 DIAGNOSIS — Z1231 Encounter for screening mammogram for malignant neoplasm of breast: Secondary | ICD-10-CM

## 2014-03-21 DIAGNOSIS — H2510 Age-related nuclear cataract, unspecified eye: Secondary | ICD-10-CM | POA: Diagnosis not present

## 2014-03-21 DIAGNOSIS — H25019 Cortical age-related cataract, unspecified eye: Secondary | ICD-10-CM | POA: Diagnosis not present

## 2014-04-12 ENCOUNTER — Ambulatory Visit
Admission: RE | Admit: 2014-04-12 | Discharge: 2014-04-12 | Disposition: A | Discharge status: Home / Self Care | Payer: Medicare Other | Source: Ambulatory Visit

## 2014-04-12 DIAGNOSIS — Z1231 Encounter for screening mammogram for malignant neoplasm of breast: Secondary | ICD-10-CM

## 2014-08-07 DIAGNOSIS — N183 Chronic kidney disease, stage 3 (moderate): Secondary | ICD-10-CM | POA: Diagnosis not present

## 2014-08-07 DIAGNOSIS — E782 Mixed hyperlipidemia: Secondary | ICD-10-CM | POA: Diagnosis not present

## 2014-08-07 DIAGNOSIS — R7309 Other abnormal glucose: Secondary | ICD-10-CM | POA: Diagnosis not present

## 2014-08-07 DIAGNOSIS — I129 Hypertensive chronic kidney disease with stage 1 through stage 4 chronic kidney disease, or unspecified chronic kidney disease: Secondary | ICD-10-CM | POA: Diagnosis not present

## 2014-08-07 DIAGNOSIS — Z0000 Encounter for general adult medical examination without abnormal findings: Secondary | ICD-10-CM | POA: Diagnosis not present

## 2014-08-21 DIAGNOSIS — M47814 Spondylosis without myelopathy or radiculopathy, thoracic region: Secondary | ICD-10-CM | POA: Diagnosis not present

## 2014-08-21 DIAGNOSIS — M19012 Primary osteoarthritis, left shoulder: Secondary | ICD-10-CM | POA: Diagnosis not present

## 2014-08-21 DIAGNOSIS — I6522 Occlusion and stenosis of left carotid artery: Secondary | ICD-10-CM | POA: Diagnosis not present

## 2014-08-21 DIAGNOSIS — M47894 Other spondylosis, thoracic region: Secondary | ICD-10-CM | POA: Diagnosis not present

## 2014-08-21 DIAGNOSIS — M47812 Spondylosis without myelopathy or radiculopathy, cervical region: Secondary | ICD-10-CM | POA: Diagnosis not present

## 2014-08-21 DIAGNOSIS — M4722 Other spondylosis with radiculopathy, cervical region: Secondary | ICD-10-CM | POA: Diagnosis not present

## 2014-08-21 DIAGNOSIS — M4682 Other specified inflammatory spondylopathies, cervical region: Secondary | ICD-10-CM | POA: Diagnosis not present

## 2014-08-21 DIAGNOSIS — M47816 Spondylosis without myelopathy or radiculopathy, lumbar region: Secondary | ICD-10-CM | POA: Diagnosis not present

## 2014-08-21 DIAGNOSIS — M4312 Spondylolisthesis, cervical region: Secondary | ICD-10-CM | POA: Diagnosis not present

## 2014-08-23 DIAGNOSIS — E119 Type 2 diabetes mellitus without complications: Secondary | ICD-10-CM | POA: Diagnosis not present

## 2014-08-26 ENCOUNTER — Other Ambulatory Visit: Payer: Self-pay | Admitting: Neurosurgery

## 2014-08-26 DIAGNOSIS — M47812 Spondylosis without myelopathy or radiculopathy, cervical region: Secondary | ICD-10-CM

## 2014-09-07 ENCOUNTER — Ambulatory Visit
Admission: RE | Admit: 2014-09-07 | Discharge: 2014-09-07 | Disposition: A | Discharge status: Home / Self Care | Payer: Medicare Other | Source: Ambulatory Visit | Attending: Neurosurgery | Admitting: Neurosurgery

## 2014-09-07 DIAGNOSIS — M47812 Spondylosis without myelopathy or radiculopathy, cervical region: Secondary | ICD-10-CM

## 2014-09-07 DIAGNOSIS — M542 Cervicalgia: Secondary | ICD-10-CM | POA: Diagnosis not present

## 2014-09-23 DIAGNOSIS — M47816 Spondylosis without myelopathy or radiculopathy, lumbar region: Secondary | ICD-10-CM | POA: Diagnosis not present

## 2014-09-23 DIAGNOSIS — M4722 Other spondylosis with radiculopathy, cervical region: Secondary | ICD-10-CM | POA: Diagnosis not present

## 2014-10-03 DIAGNOSIS — K921 Melena: Secondary | ICD-10-CM | POA: Diagnosis not present

## 2014-10-03 DIAGNOSIS — K922 Gastrointestinal hemorrhage, unspecified: Secondary | ICD-10-CM | POA: Diagnosis not present

## 2014-10-04 DIAGNOSIS — D649 Anemia, unspecified: Secondary | ICD-10-CM | POA: Diagnosis not present

## 2014-10-07 DIAGNOSIS — D649 Anemia, unspecified: Secondary | ICD-10-CM | POA: Diagnosis not present

## 2014-10-10 DIAGNOSIS — D649 Anemia, unspecified: Secondary | ICD-10-CM | POA: Diagnosis not present

## 2015-01-08 DIAGNOSIS — M4722 Other spondylosis with radiculopathy, cervical region: Secondary | ICD-10-CM | POA: Diagnosis not present

## 2015-01-08 DIAGNOSIS — M47816 Spondylosis without myelopathy or radiculopathy, lumbar region: Secondary | ICD-10-CM | POA: Diagnosis not present

## 2015-01-13 ENCOUNTER — Other Ambulatory Visit: Payer: Self-pay

## 2015-02-05 DIAGNOSIS — E119 Type 2 diabetes mellitus without complications: Secondary | ICD-10-CM | POA: Diagnosis not present

## 2015-02-05 DIAGNOSIS — I129 Hypertensive chronic kidney disease with stage 1 through stage 4 chronic kidney disease, or unspecified chronic kidney disease: Secondary | ICD-10-CM | POA: Diagnosis not present

## 2015-02-05 DIAGNOSIS — N183 Chronic kidney disease, stage 3 (moderate): Secondary | ICD-10-CM | POA: Diagnosis not present

## 2015-02-05 DIAGNOSIS — D485 Neoplasm of uncertain behavior of skin: Secondary | ICD-10-CM | POA: Diagnosis not present

## 2015-02-05 DIAGNOSIS — E782 Mixed hyperlipidemia: Secondary | ICD-10-CM | POA: Diagnosis not present

## 2015-02-20 DIAGNOSIS — I129 Hypertensive chronic kidney disease with stage 1 through stage 4 chronic kidney disease, or unspecified chronic kidney disease: Secondary | ICD-10-CM | POA: Diagnosis not present

## 2015-02-20 DIAGNOSIS — D485 Neoplasm of uncertain behavior of skin: Secondary | ICD-10-CM | POA: Diagnosis not present

## 2015-03-07 ENCOUNTER — Other Ambulatory Visit: Payer: Self-pay

## 2015-03-07 DIAGNOSIS — Z1231 Encounter for screening mammogram for malignant neoplasm of breast: Secondary | ICD-10-CM

## 2015-03-25 DIAGNOSIS — H02102 Unspecified ectropion of right lower eyelid: Secondary | ICD-10-CM | POA: Diagnosis not present

## 2015-03-25 DIAGNOSIS — E119 Type 2 diabetes mellitus without complications: Secondary | ICD-10-CM | POA: Diagnosis not present

## 2015-03-25 DIAGNOSIS — H02834 Dermatochalasis of left upper eyelid: Secondary | ICD-10-CM | POA: Diagnosis not present

## 2015-03-25 DIAGNOSIS — H02105 Unspecified ectropion of left lower eyelid: Secondary | ICD-10-CM | POA: Diagnosis not present

## 2015-03-31 ENCOUNTER — Emergency Department (HOSPITAL_COMMUNITY): Payer: Medicare Other

## 2015-03-31 ENCOUNTER — Inpatient Hospital Stay (HOSPITAL_COMMUNITY)
Admission: EM | Admit: 2015-03-31 | Discharge: 2015-04-04 | DRG: 353 | Disposition: A | Discharge status: Home / Self Care | Payer: Medicare Other | Attending: General Surgery | Admitting: General Surgery

## 2015-03-31 ENCOUNTER — Ambulatory Visit
Admission: RE | Admit: 2015-03-31 | Discharge: 2015-03-31 | Disposition: A | Discharge status: Home / Self Care | Payer: Medicare Other | Source: Ambulatory Visit | Attending: Family Medicine | Admitting: Family Medicine

## 2015-03-31 ENCOUNTER — Encounter (HOSPITAL_COMMUNITY): Payer: Self-pay | Admitting: Family Medicine

## 2015-03-31 ENCOUNTER — Other Ambulatory Visit: Payer: Self-pay | Admitting: Family Medicine

## 2015-03-31 DIAGNOSIS — R1084 Generalized abdominal pain: Secondary | ICD-10-CM

## 2015-03-31 DIAGNOSIS — N2889 Other specified disorders of kidney and ureter: Secondary | ICD-10-CM | POA: Diagnosis present

## 2015-03-31 DIAGNOSIS — R1030 Lower abdominal pain, unspecified: Secondary | ICD-10-CM | POA: Diagnosis not present

## 2015-03-31 DIAGNOSIS — R112 Nausea with vomiting, unspecified: Secondary | ICD-10-CM | POA: Diagnosis not present

## 2015-03-31 DIAGNOSIS — E878 Other disorders of electrolyte and fluid balance, not elsewhere classified: Secondary | ICD-10-CM | POA: Diagnosis not present

## 2015-03-31 DIAGNOSIS — N19 Unspecified kidney failure: Secondary | ICD-10-CM

## 2015-03-31 DIAGNOSIS — Z7982 Long term (current) use of aspirin: Secondary | ICD-10-CM

## 2015-03-31 DIAGNOSIS — N184 Chronic kidney disease, stage 4 (severe): Secondary | ICD-10-CM | POA: Diagnosis present

## 2015-03-31 DIAGNOSIS — K420 Umbilical hernia with obstruction, without gangrene: Principal | ICD-10-CM | POA: Diagnosis present

## 2015-03-31 DIAGNOSIS — N170 Acute kidney failure with tubular necrosis: Secondary | ICD-10-CM | POA: Diagnosis not present

## 2015-03-31 DIAGNOSIS — R109 Unspecified abdominal pain: Secondary | ICD-10-CM | POA: Diagnosis not present

## 2015-03-31 DIAGNOSIS — T502X5A Adverse effect of carbonic-anhydrase inhibitors, benzothiadiazides and other diuretics, initial encounter: Secondary | ICD-10-CM | POA: Diagnosis present

## 2015-03-31 DIAGNOSIS — K5660 Unspecified intestinal obstruction: Secondary | ICD-10-CM | POA: Diagnosis not present

## 2015-03-31 DIAGNOSIS — I129 Hypertensive chronic kidney disease with stage 1 through stage 4 chronic kidney disease, or unspecified chronic kidney disease: Secondary | ICD-10-CM | POA: Diagnosis present

## 2015-03-31 DIAGNOSIS — E871 Hypo-osmolality and hyponatremia: Secondary | ICD-10-CM | POA: Diagnosis not present

## 2015-03-31 DIAGNOSIS — Z79891 Long term (current) use of opiate analgesic: Secondary | ICD-10-CM

## 2015-03-31 DIAGNOSIS — N179 Acute kidney failure, unspecified: Secondary | ICD-10-CM

## 2015-03-31 DIAGNOSIS — Z880 Allergy status to penicillin: Secondary | ICD-10-CM

## 2015-03-31 DIAGNOSIS — K429 Umbilical hernia without obstruction or gangrene: Secondary | ICD-10-CM | POA: Diagnosis not present

## 2015-03-31 DIAGNOSIS — Z6838 Body mass index (BMI) 38.0-38.9, adult: Secondary | ICD-10-CM

## 2015-03-31 DIAGNOSIS — K56609 Unspecified intestinal obstruction, unspecified as to partial versus complete obstruction: Secondary | ICD-10-CM | POA: Diagnosis present

## 2015-03-31 DIAGNOSIS — E860 Dehydration: Secondary | ICD-10-CM | POA: Diagnosis present

## 2015-03-31 DIAGNOSIS — Z888 Allergy status to other drugs, medicaments and biological substances status: Secondary | ICD-10-CM

## 2015-03-31 DIAGNOSIS — E780 Pure hypercholesterolemia: Secondary | ICD-10-CM | POA: Diagnosis present

## 2015-03-31 DIAGNOSIS — R1110 Vomiting, unspecified: Secondary | ICD-10-CM | POA: Diagnosis not present

## 2015-03-31 HISTORY — DX: Pure hypercholesterolemia, unspecified: E78.00

## 2015-03-31 HISTORY — DX: Essential (primary) hypertension: I10

## 2015-03-31 LAB — COMPREHENSIVE METABOLIC PANEL
ALT: 22 U/L (ref 14–54)
AST: 33 U/L (ref 15–41)
Albumin: 4.4 g/dL (ref 3.5–5.0)
Alkaline Phosphatase: 61 U/L (ref 38–126)
Anion gap: 20 — ABNORMAL HIGH (ref 5–15)
BUN: 58 mg/dL — ABNORMAL HIGH (ref 6–20)
CO2: 25 mmol/L (ref 22–32)
Calcium: 10.6 mg/dL — ABNORMAL HIGH (ref 8.9–10.3)
Chloride: 91 mmol/L — ABNORMAL LOW (ref 101–111)
Creatinine, Ser: 4.04 mg/dL — ABNORMAL HIGH (ref 0.44–1.00)
GFR calc Af Amer: 11 mL/min — ABNORMAL LOW (ref >60)
GFR calc non Af Amer: 10 mL/min — ABNORMAL LOW (ref >60)
Glucose, Bld: 146 mg/dL — ABNORMAL HIGH (ref 65–99)
Potassium: 4.1 mmol/L (ref 3.5–5.1)
Sodium: 136 mmol/L (ref 135–145)
Total Bilirubin: 1.2 mg/dL (ref 0.3–1.2)
Total Protein: 7.9 g/dL (ref 6.5–8.1)

## 2015-03-31 LAB — CBC
HCT: 42.8 % (ref 36.0–46.0)
Hemoglobin: 14.3 g/dL (ref 12.0–15.0)
MCH: 29.7 pg (ref 26.0–34.0)
MCHC: 33.4 g/dL (ref 30.0–36.0)
MCV: 88.8 fL (ref 78.0–100.0)
Platelets: 376 10*3/uL (ref 150–400)
RBC: 4.82 MIL/uL (ref 3.87–5.11)
RDW: 14.2 % (ref 11.5–15.5)
WBC: 6.6 10*3/uL (ref 4.0–10.5)

## 2015-03-31 LAB — LIPASE, BLOOD: Lipase: 21 U/L — ABNORMAL LOW (ref 22–51)

## 2015-03-31 MED ORDER — ONDANSETRON 4 MG PO TBDP
ORAL_TABLET | ORAL | Status: AC
  Filled 2015-03-31: qty 1

## 2015-03-31 MED ORDER — IOHEXOL 300 MG/ML  SOLN: 25.0000 mL | INTRAMUSCULAR | Status: AC

## 2015-03-31 MED ORDER — ONDANSETRON 4 MG PO TBDP
4.0000 mg | ORAL_TABLET | Freq: Once | ORAL | Status: AC | PRN
  Administered 2015-03-31: 4 mg via ORAL

## 2015-03-31 MED ORDER — FENTANYL CITRATE (PF) 100 MCG/2ML IJ SOLN
25.0000 ug | Freq: Once | INTRAMUSCULAR | Status: AC
  Administered 2015-03-31: 25 ug via INTRAVENOUS
  Filled 2015-03-31: qty 2

## 2015-03-31 MED ORDER — SODIUM CHLORIDE 0.9 % IV SOLN
INTRAVENOUS | Status: DC
  Administered 2015-03-31: via INTRAVENOUS

## 2015-03-31 MED ORDER — ONDANSETRON HCL 4 MG/2ML IJ SOLN
4.0000 mg | Freq: Once | INTRAMUSCULAR | Status: AC
  Administered 2015-03-31: 4 mg via INTRAVENOUS
  Filled 2015-03-31: qty 2

## 2015-03-31 MED ORDER — SODIUM CHLORIDE 0.9 % IV BOLUS (SEPSIS)
1000.0000 mL | Freq: Once | INTRAVENOUS | Status: AC
  Administered 2015-03-31: 1000 mL via INTRAVENOUS

## 2015-03-31 NOTE — ED Notes (Signed)
Pt unable to void at this time. 

## 2015-03-31 NOTE — ED Notes (Signed)
Pt here for 2 days of abd pain, N,V,D. Sent here by her doctor for bowel obstruction.

## 2015-03-31 NOTE — ED Notes (Signed)
Pt drinking contrast for CT. 

## 2015-03-31 NOTE — ED Provider Notes (Addendum)
CSN: [id number]     Arrival date & time 03/31/15  1608 History   First MD Initiated Contact with Patient 03/31/15 1956     Chief Complaint  Patient presents with  . Abdominal Pain     (Consider location/radiation/quality/duration/timing/severity/associated sxs/prior Treatment) Patient is a 81 y.o. female presenting with abdominal pain. The history is provided by the patient and the spouse.  Abdominal Pain Associated symptoms: nausea and vomiting   Associated symptoms: no chest pain, no diarrhea, no dysuria, no fever and no shortness of breath    patient with onset on Saturday afternoon of abdominal pain somewhat generalized left and right side. Pain over the weekend was 10 out of 10 pain today is more 4 out of 10. Associated with nausea and vomiting about 2 times each day but no diarrhea. No fever. Patient has been told in the past that she has an umbilical hernia. Patient has had her gallbladder removed in her appendix removed in the past.  Patient was at her primary care doctor's office today they did do an acute abdominal series which raised concerns for small bowel obstruction. Patient referred in for further evaluation and CT scan. Patient has not had any diarrhea.  Past Medical History  Diagnosis Date  . Hypertension   . High cholesterol    Past Surgical History  Procedure Laterality Date  . Back surgery     History reviewed. No pertinent family history. Social History  Substance Use Topics  . Smoking status: Never Smoker   . Smokeless tobacco: None  . Alcohol Use: None   OB History    No data available     Review of Systems  Constitutional: Negative for fever.  HENT: Negative for congestion.   Eyes: Negative for visual disturbance.  Respiratory: Negative for shortness of breath.   Cardiovascular: Negative for chest pain.  Gastrointestinal: Positive for nausea, vomiting, abdominal pain and abdominal distention. Negative for diarrhea and blood in stool.   Genitourinary: Negative for dysuria.  Musculoskeletal: Negative for back pain.  Skin: Negative for rash.  Neurological: Negative for headaches.  Hematological: Does not bruise/bleed easily.  Psychiatric/Behavioral: Negative for confusion.      Allergies  Other; Penicillins; and Lovastatin  Home Medications   Prior to Admission medications   Medication Sig Start Date End Date Taking? Authorizing Provider  amLODipine (NORVASC) 5 MG tablet Take 5 mg by mouth daily.   Yes Historical Provider, MD  aspirin EC 81 MG tablet Take 81 mg by mouth every other day.    Yes Historical Provider, MD  Cholecalciferol (VITAMIN D3) 2000 UNITS capsule Take 2,000 Units by mouth daily.   Yes Historical Provider, MD  clindamycin (CLEOCIN) 150 MG capsule Take 600 mg by mouth once. FOR DENTIST   Yes Historical Provider, MD  Coenzyme Q10 (COQ10) 100 MG CAPS Take 100 mg by mouth daily.   Yes Historical Provider, MD  cyclobenzaprine (FLEXERIL) 10 MG tablet Take 5-10 mg by mouth 3 (three) times daily as needed for muscle spasms.   Yes Historical Provider, MD  fenofibrate 160 MG tablet Take 160 mg by mouth every evening.    Yes Historical Provider, MD  Flaxseed, Linseed, (FLAXSEED OIL) 1000 MG CAPS Take 1,000 mg by mouth daily.   Yes Historical Provider, MD  HYDROcodone-acetaminophen (NORCO/VICODIN) 5-325 MG per tablet Take 0.5 tablets by mouth 2 (two) times daily.    Yes Historical Provider, MD  metoprolol succinate (TOPROL-XL) 100 MG 24 hr tablet Take 100 mg by mouth every evening.  Take with or immediately following a meal.   Yes Historical Provider, MD  Omega-3 Fatty Acids (FISH OIL) 1000 MG CAPS Take 1,000 mg by mouth daily.   Yes Historical Provider, MD  triamterene-hydrochlorothiazide (MAXZIDE) 75-50 MG per tablet Take 1 tablet by mouth daily.   Yes Historical Provider, MD   BP 176/60 mmHg  Pulse 50  Temp(Src) 97.8 F (36.6 C) (Oral)  Resp 16  Wt 203 lb (92.08 kg)  SpO2 94% Physical Exam   Constitutional: She is oriented to person, place, and time. She appears well-developed and well-nourished. No distress.  HENT:  Head: Normocephalic and atraumatic.  Mouth/Throat: Oropharynx is clear and moist.  Eyes: Conjunctivae and EOM are normal. Pupils are equal, round, and reactive to light.  Neck: Normal range of motion. Neck supple.  Cardiovascular: Normal rate, regular rhythm and normal heart sounds.   No murmur heard. Pulmonary/Chest: Effort normal and breath sounds normal. No respiratory distress.  Abdominal: Soft. Bowel sounds are normal. She exhibits distension. There is tenderness.  Abdomen some mild diffuse tenderness no significant guarding. Suggestion of umbilical hernia there is some discoloration around the umbilicus no tenderness no palpable mass but clinically suggestive of an umbilical hernia. Cannot tell whether it's incarcerated or not.  Musculoskeletal: Normal range of motion.  Neurological: She is alert and oriented to person, place, and time. No cranial nerve deficit. She exhibits normal muscle tone. Coordination normal.  Skin: Skin is warm. No rash noted.  Nursing note and vitals reviewed.   ED Course  Procedures (including critical care time) Labs Review Labs Reviewed  LIPASE, BLOOD - Abnormal; Notable for the following:    Lipase 21 (*)    All other components within normal limits  COMPREHENSIVE METABOLIC PANEL - Abnormal; Notable for the following:    Chloride 91 (*)    Glucose, Bld 146 (*)    BUN 58 (*)    Creatinine, Ser 4.04 (*)    Calcium 10.6 (*)    GFR calc non Af Amer 10 (*)    GFR calc Af Amer 11 (*)    Anion gap 20 (*)    All other components within normal limits  CBC  URINALYSIS, ROUTINE W REFLEX MICROSCOPIC (NOT AT [facility])   Results for orders placed or performed during the hospital encounter of 03/31/15  Lipase, blood  Result Value Ref Range   Lipase 21 (L) 22 - 51 U/L  Comprehensive metabolic panel  Result Value Ref Range   Sodium  136 135 - 145 mmol/L   Potassium 4.1 3.5 - 5.1 mmol/L   Chloride 91 (L) 101 - 111 mmol/L   CO2 25 22 - 32 mmol/L   Glucose, Bld 146 (H) 65 - 99 mg/dL   BUN 58 (H) 6 - 20 mg/dL   Creatinine, Ser 4.04 (H) 0.44 - 1.00 mg/dL   Calcium 10.6 (H) 8.9 - 10.3 mg/dL   Total Protein 7.9 6.5 - 8.1 g/dL   Albumin 4.4 3.5 - 5.0 g/dL   AST 33 15 - 41 U/L   ALT 22 14 - 54 U/L   Alkaline Phosphatase 61 38 - 126 U/L   Total Bilirubin 1.2 0.3 - 1.2 mg/dL   GFR calc non Af Amer 10 (L) >60 mL/min   GFR calc Af Amer 11 (L) >60 mL/min   Anion gap 20 (H) 5 - 15  CBC  Result Value Ref Range   WBC 6.6 4.0 - 10.5 K/uL   RBC 4.82 3.87 - 5.11 MIL/uL  Hemoglobin 14.3 12.0 - 15.0 g/dL   HCT 42.8 36.0 - 46.0 %   MCV 88.8 78.0 - 100.0 fL   MCH 29.7 26.0 - 34.0 pg   MCHC 33.4 30.0 - 36.0 g/dL   RDW 14.2 11.5 - 15.5 %   Platelets 376 150 - 400 K/uL     Imaging Review Ct Abdomen Pelvis Wo Contrast  03/31/2015   CLINICAL DATA:  81 year old female with lower abdominal AA is caps  EXAM: CT ABDOMEN AND PELVIS WITHOUT CONTRAST  TECHNIQUE: Multidetector CT imaging of the abdomen and pelvis was performed following the standard protocol without IV contrast.  COMPARISON:  Radiograph dated 03/31/2015 and ultrasound dated 10/11/2013  FINDINGS: Evaluation of this exam is limited in the absence of intravenous contrast.  The visualized lung bases are clear. No intra-abdominal free air or free fluid.  Cholecystectomy. The liver, pancreas, spleen, right adrenal gland appear unremarkable. There is a 12 mm left adrenal adenomas. Mild bilateral renal atrophy. Lobulated appearance of the renal cortices. An exophytic right renal upper pole lesion as well as an ill-defined inferior pole hypodense lesions are not well characterized. Ultrasound is recommended for further evaluation of the kidneys. There is no hydronephrosis or nephrolithiasis on either side. The visualized ureters and urinary bladder appear unremarkable. A small left bladder  wall diverticulitis noted. Hysterectomy.  There is a small umbilical hernia containing a short segment of the distal small bowel. Oral contrast is noted within the stomach and multiple loops of proximal and mid small bowel. There is dilatation of the proximal and mid bowel loops. The terminal ileum demonstrates normal caliber. A transition zone is noted at the neck of the umbilical hernia. There is sigmoid diverticulosis without active inflammation. The appendix is not visualized with certainty. No inflammatory changes identified in the right lower quadrant.  There is aortoiliac atherosclerotic disease. There is no lymphadenopathy.  There is a 2 cm peritoneal defect along the lateral pelvic wall with herniation of the pelvic fat. There is extensive degenerative changes of the spine. L3-L4 fixation screws as well as L4-5 disc spacer noted. There is grade 1 L4-5 anterolisthesis. The bones are osteopenic. Scattered lucency throughout the spine most compatible with osteopenia. Metastatic disease is less likely. Simple correlation recommended. No acute fracture.  IMPRESSION: Small-bowel obstruction with transition zone at the level of the umbilicus secondary to herniation of the distal small bowel into the umbilical hernia. No inflammatory changes or fluid collection noted within the umbilical hernia. Clinical correlation and surgical consult is advised.   Electronically Signed   By: Anner Crete M.D.   On: 03/31/2015 23:39   Dg Chest 2 View  03/31/2015   CLINICAL DATA:  Low abdominal pain with nausea and vomiting for 2 days. History of hypertension.  EXAM: CHEST  2 VIEW  COMPARISON:  01/24/2009.  FINDINGS: The heart size and mediastinal contours are within normal limits. Both lungs are clear. No pleural effusion or pneumothorax. Bony thorax is demineralized but grossly intact  IMPRESSION: No active cardiopulmonary disease.   Electronically Signed   By: Lajean Manes M.D.   On: 03/31/2015 21:41   Dg Abd 2  Views  03/31/2015   CLINICAL DATA:  Lower abdominal pain for 2 days with vomiting and weakness.  EXAM: ABDOMEN - 2 VIEW  COMPARISON:  None.  FINDINGS: Moderately distended gas-filled loops of small bowel are seen within the left upper quadrant, with associated air-fluid levels, suggesting small bowel obstruction versus ileus. Additional scattered nonspecific air-fluid levels are  also seen within the pelvis and right lower quadrant.  No evidence of free intraperitoneal air seen. Surgical clips in the right upper quadrant likely related to previous cholecystectomy.  Fixation hardware and disc spacers noted within the lower lumbar spine. Degenerative changes noted throughout the visualized thoracolumbar spine, moderate in degree, with scoliosis. No acute-appearing osseous abnormality.  IMPRESSION: Moderately distended gas-filled loops of small bowel within the left upper quadrant, with associated air-fluid levels, consistent with either a developing small bowel obstruction or ileus. Favor mechanical small bowel obstruction.  Additional nonspecific air-fluid levels scattered within the pelvis and right lower quadrant.  These results were called by telephone at the time of interpretation on 03/31/2015 at 3:24 pm to Dr. Donavan Burnet , who verbally acknowledged these results.   Electronically Signed   By: Franki Cabot M.D.   On: 03/31/2015 15:24   I have personally reviewed and evaluated these images and lab results as part of my medical decision-making.   EKG Interpretation None      MDM   Final diagnoses:  Generalized abdominal pain  Prerenal renal failure  SBO (small bowel obstruction)  Umbilical hernia with obstruction    Patient referred in by her primary care doctor Dr. Alroy Dust. Patient had the abdominal x-rays done suggestive of a small bowel obstruction. She has had a long-standing umbilical hernia. Pain is generalized throughout the abdomen. There is some redness to the umbilical hernia. No  significant tenderness to palpation. Clinically it could be the point of obstruction. CT scan of the abdomen and pelvis is pending. Patient cannot have IV contrast due to her elevated creatinine. Patient's BUN and creatinine seems to be consistent with prerenal renal failure. Patient given 1 L of fluids here. Patient will require admission however need to CT scan to determine what services need to be involved.  Patient's labs without significant abnormalities other than the elevation of BUN and creatinine. No significant leukocytosis. Patient's past medical history is significant for hypertension and high cholesterol. Patient has had her gallbladder removed in the past and had her appendix removed in the past.  Chest x-ray is negative for any evidence of pneumonia pulmonary edema pneumothorax or free air under the diaphragm.  Fredia Sorrow, MD 03/31/15 2346  CT scan shows evidence of small bowel obstruction with transition point at the umbilicus due to an umbilical hernia. We'll discuss with general surgery. Doesn't seem that the umbilical hernia is reducible. In addition historically patient states that she's had similar pain in that area in the past but has not lasted for several days.    Fredia Sorrow, MD 03/31/15 [place], MD 03/31/15 [place], MD 03/31/15 [id number]

## 2015-04-01 ENCOUNTER — Inpatient Hospital Stay (HOSPITAL_COMMUNITY): Payer: Medicare Other

## 2015-04-01 DIAGNOSIS — N183 Chronic kidney disease, stage 3 (moderate): Secondary | ICD-10-CM | POA: Diagnosis not present

## 2015-04-01 DIAGNOSIS — N170 Acute kidney failure with tubular necrosis: Secondary | ICD-10-CM | POA: Diagnosis not present

## 2015-04-01 DIAGNOSIS — Z880 Allergy status to penicillin: Secondary | ICD-10-CM | POA: Diagnosis not present

## 2015-04-01 DIAGNOSIS — E871 Hypo-osmolality and hyponatremia: Secondary | ICD-10-CM | POA: Diagnosis not present

## 2015-04-01 DIAGNOSIS — E780 Pure hypercholesterolemia: Secondary | ICD-10-CM | POA: Diagnosis present

## 2015-04-01 DIAGNOSIS — Z6838 Body mass index (BMI) 38.0-38.9, adult: Secondary | ICD-10-CM | POA: Diagnosis not present

## 2015-04-01 DIAGNOSIS — E878 Other disorders of electrolyte and fluid balance, not elsewhere classified: Secondary | ICD-10-CM | POA: Diagnosis present

## 2015-04-01 DIAGNOSIS — K56609 Unspecified intestinal obstruction, unspecified as to partial versus complete obstruction: Secondary | ICD-10-CM | POA: Diagnosis present

## 2015-04-01 DIAGNOSIS — N179 Acute kidney failure, unspecified: Secondary | ICD-10-CM | POA: Diagnosis not present

## 2015-04-01 DIAGNOSIS — T502X5A Adverse effect of carbonic-anhydrase inhibitors, benzothiadiazides and other diuretics, initial encounter: Secondary | ICD-10-CM | POA: Diagnosis present

## 2015-04-01 DIAGNOSIS — I129 Hypertensive chronic kidney disease with stage 1 through stage 4 chronic kidney disease, or unspecified chronic kidney disease: Secondary | ICD-10-CM | POA: Diagnosis present

## 2015-04-01 DIAGNOSIS — I120 Hypertensive chronic kidney disease with stage 5 chronic kidney disease or end stage renal disease: Secondary | ICD-10-CM | POA: Diagnosis not present

## 2015-04-01 DIAGNOSIS — E860 Dehydration: Secondary | ICD-10-CM | POA: Diagnosis not present

## 2015-04-01 DIAGNOSIS — K420 Umbilical hernia with obstruction, without gangrene: Secondary | ICD-10-CM | POA: Diagnosis not present

## 2015-04-01 DIAGNOSIS — Z888 Allergy status to other drugs, medicaments and biological substances status: Secondary | ICD-10-CM | POA: Diagnosis not present

## 2015-04-01 DIAGNOSIS — N184 Chronic kidney disease, stage 4 (severe): Secondary | ICD-10-CM | POA: Diagnosis not present

## 2015-04-01 DIAGNOSIS — Z7982 Long term (current) use of aspirin: Secondary | ICD-10-CM | POA: Diagnosis not present

## 2015-04-01 DIAGNOSIS — N2889 Other specified disorders of kidney and ureter: Secondary | ICD-10-CM | POA: Diagnosis present

## 2015-04-01 DIAGNOSIS — Z79891 Long term (current) use of opiate analgesic: Secondary | ICD-10-CM | POA: Diagnosis not present

## 2015-04-01 DIAGNOSIS — R1084 Generalized abdominal pain: Secondary | ICD-10-CM | POA: Diagnosis not present

## 2015-04-01 LAB — COMPREHENSIVE METABOLIC PANEL
ALT: 18 U/L (ref 14–54)
AST: 32 U/L (ref 15–41)
Albumin: 4 g/dL (ref 3.5–5.0)
Alkaline Phosphatase: 55 U/L (ref 38–126)
Anion gap: 14 (ref 5–15)
BUN: 63 mg/dL — ABNORMAL HIGH (ref 6–20)
CO2: 28 mmol/L (ref 22–32)
Calcium: 9.7 mg/dL (ref 8.9–10.3)
Chloride: 93 mmol/L — ABNORMAL LOW (ref 101–111)
Creatinine, Ser: 4.33 mg/dL — ABNORMAL HIGH (ref 0.44–1.00)
GFR calc Af Amer: 10 mL/min — ABNORMAL LOW (ref >60)
GFR calc non Af Amer: 9 mL/min — ABNORMAL LOW (ref >60)
Glucose, Bld: 150 mg/dL — ABNORMAL HIGH (ref 65–99)
Potassium: 4.2 mmol/L (ref 3.5–5.1)
Sodium: 135 mmol/L (ref 135–145)
Total Bilirubin: 0.9 mg/dL (ref 0.3–1.2)
Total Protein: 7.2 g/dL (ref 6.5–8.1)

## 2015-04-01 LAB — CBC
HCT: 39.4 % (ref 36.0–46.0)
Hemoglobin: 13 g/dL (ref 12.0–15.0)
MCH: 29.3 pg (ref 26.0–34.0)
MCHC: 33 g/dL (ref 30.0–36.0)
MCV: 88.9 fL (ref 78.0–100.0)
Platelets: 340 10*3/uL (ref 150–400)
RBC: 4.43 MIL/uL (ref 3.87–5.11)
RDW: 14.1 % (ref 11.5–15.5)
WBC: 6.9 10*3/uL (ref 4.0–10.5)

## 2015-04-01 LAB — URINALYSIS, ROUTINE W REFLEX MICROSCOPIC
Glucose, UA: NEGATIVE mg/dL
Hgb urine dipstick: NEGATIVE
Ketones, ur: 15 mg/dL — AB
Nitrite: NEGATIVE
Protein, ur: 30 mg/dL — AB
Specific Gravity, Urine: 1.02 (ref 1.005–1.030)
Urobilinogen, UA: 1 mg/dL (ref 0.0–1.0)
pH: 5 (ref 5.0–8.0)

## 2015-04-01 LAB — SODIUM, URINE, RANDOM: Sodium, Ur: <10 mmol/L

## 2015-04-01 LAB — BASIC METABOLIC PANEL
Anion gap: 11 (ref 5–15)
BUN: 68 mg/dL — ABNORMAL HIGH (ref 6–20)
CO2: 26 mmol/L (ref 22–32)
Calcium: 8.5 mg/dL — ABNORMAL LOW (ref 8.9–10.3)
Chloride: 99 mmol/L — ABNORMAL LOW (ref 101–111)
Creatinine, Ser: 4.04 mg/dL — ABNORMAL HIGH (ref 0.44–1.00)
GFR calc Af Amer: 11 mL/min — ABNORMAL LOW (ref >60)
GFR calc non Af Amer: 10 mL/min — ABNORMAL LOW (ref >60)
Glucose, Bld: 108 mg/dL — ABNORMAL HIGH (ref 65–99)
Potassium: 3.7 mmol/L (ref 3.5–5.1)
Sodium: 136 mmol/L (ref 135–145)

## 2015-04-01 LAB — URINE MICROSCOPIC-ADD ON

## 2015-04-01 LAB — CREATININE, URINE, RANDOM: Creatinine, Urine: 180.95 mg/dL

## 2015-04-01 MED ORDER — AMLODIPINE BESYLATE 5 MG PO TABS: 5.0000 mg | ORAL_TABLET | Freq: Every day | ORAL | Status: DC

## 2015-04-01 MED ORDER — HYDRALAZINE HCL 20 MG/ML IJ SOLN: 10.0000 mg | INTRAMUSCULAR | Status: DC | PRN

## 2015-04-01 MED ORDER — CETYLPYRIDINIUM CHLORIDE 0.05 % MT LIQD
7.0000 mL | Freq: Two times a day (BID) | OROMUCOSAL | Status: DC
  Administered 2015-04-01 – 2015-04-02 (×2): 7 mL via OROMUCOSAL

## 2015-04-01 MED ORDER — DEXTROSE-NACL 5-0.9 % IV SOLN
INTRAVENOUS | Status: DC
  Administered 2015-04-01 (×3): via INTRAVENOUS

## 2015-04-01 MED ORDER — HYDROMORPHONE HCL 1 MG/ML IJ SOLN: 0.5000 mg | INTRAMUSCULAR | Status: DC | PRN

## 2015-04-01 MED ORDER — ONDANSETRON HCL 4 MG/2ML IJ SOLN
4.0000 mg | Freq: Four times a day (QID) | INTRAMUSCULAR | Status: DC | PRN
  Administered 2015-04-02: 4 mg via INTRAVENOUS

## 2015-04-01 MED ORDER — METOPROLOL SUCCINATE ER 100 MG PO TB24
100.0000 mg | ORAL_TABLET | Freq: Every evening | ORAL | Status: DC
  Administered 2015-04-01 – 2015-04-02 (×2): 100 mg via ORAL
  Filled 2015-04-01 (×3): qty 1

## 2015-04-01 MED ORDER — ENOXAPARIN SODIUM 40 MG/0.4ML ~~LOC~~ SOLN: 40.0000 mg | SUBCUTANEOUS | Status: DC

## 2015-04-01 MED ORDER — ONDANSETRON 4 MG PO TBDP: 4.0000 mg | ORAL_TABLET | Freq: Four times a day (QID) | ORAL | Status: DC | PRN

## 2015-04-01 MED ORDER — HYDROMORPHONE HCL 1 MG/ML IJ SOLN
0.5000 mg | INTRAMUSCULAR | Status: DC | PRN
  Administered 2015-04-01: 1 mg via INTRAVENOUS
  Filled 2015-04-01: qty 1

## 2015-04-01 NOTE — Progress Notes (Signed)
Patient ID: Tabitha Black, female   DOB: 1932-12-18, 81 y.o.   MRN: [id number]    Subjective: Pt c/o some abdominal pain still at her hernia site.  No further nausea.  Objective: Vital signs in last 24 hours: Temp:  [97.6 F (36.4 C)-98.3 F (36.8 C)] 98.3 F (36.8 C) (09/13 0502) Pulse Rate:  [36-110] 97 (09/13 0502) Resp:  [16-18] 18 (09/13 0502) BP: (143-176)/(30-78) 143/58 mmHg (09/13 0502) SpO2:  [92 %-99 %] 95 % (09/13 0502) Weight:  [92.08 kg (203 lb)-99.3 kg (218 lb 14.7 oz)] 99.3 kg (218 lb 14.7 oz) (09/13 0217) Last BM Date: 03/31/15  Intake/Output from previous day: 09/12 0701 - 09/13 0700 In: 1554 [I.V.:1554] Out: 300 [Urine:300] Intake/Output this shift:    PE: Abd: soft, umbilical hernia still incarcerated.  Unable to completely reduce right now secondary to pain. +BS HEart: regular Lungs: CTAB  Lab Results:   Recent Labs  03/31/15 1715 04/01/15 0501  WBC 6.6 6.9  HGB 14.3 13.0  HCT 42.8 39.4  PLT 376 340   BMET  Recent Labs  03/31/15 1715 04/01/15 0501  NA 136 135  K 4.1 4.2  CL 91* 93*  CO2 25 28  GLUCOSE 146* 150*  BUN 58* 63*  CREATININE 4.04* 4.33*  CALCIUM 10.6* 9.7   PT/INR No results for input(s): LABPROT, INR in the last 72 hours. CMP     Component Value Date/Time   NA 135 04/01/2015 0501   K 4.2 04/01/2015 0501   CL 93* 04/01/2015 0501   CO2 28 04/01/2015 0501   GLUCOSE 150* 04/01/2015 0501   BUN 63* 04/01/2015 0501   CREATININE 4.33* 04/01/2015 0501   CALCIUM 9.7 04/01/2015 0501   PROT 7.2 04/01/2015 0501   ALBUMIN 4.0 04/01/2015 0501   AST 32 04/01/2015 0501   ALT 18 04/01/2015 0501   ALKPHOS 55 04/01/2015 0501   BILITOT 0.9 04/01/2015 0501   GFRNONAA 9* 04/01/2015 0501   GFRAA 10* 04/01/2015 0501   Lipase     Component Value Date/Time   LIPASE 21* 03/31/2015 1715       Studies/Results: Ct Abdomen Pelvis Wo Contrast  03/31/2015   CLINICAL DATA:  81 year old female with lower abdominal AA is caps  EXAM: CT  ABDOMEN AND PELVIS WITHOUT CONTRAST  TECHNIQUE: Multidetector CT imaging of the abdomen and pelvis was performed following the standard protocol without IV contrast.  COMPARISON:  Radiograph dated 03/31/2015 and ultrasound dated 10/11/2013  FINDINGS: Evaluation of this exam is limited in the absence of intravenous contrast.  The visualized lung bases are clear. No intra-abdominal free air or free fluid.  Cholecystectomy. The liver, pancreas, spleen, right adrenal gland appear unremarkable. There is a 12 mm left adrenal adenomas. Mild bilateral renal atrophy. Lobulated appearance of the renal cortices. An exophytic right renal upper pole lesion as well as an ill-defined inferior pole hypodense lesions are not well characterized. Ultrasound is recommended for further evaluation of the kidneys. There is no hydronephrosis or nephrolithiasis on either side. The visualized ureters and urinary bladder appear unremarkable. A small left bladder wall diverticulitis noted. Hysterectomy.  There is a small umbilical hernia containing a short segment of the distal small bowel. Oral contrast is noted within the stomach and multiple loops of proximal and mid small bowel. There is dilatation of the proximal and mid bowel loops. The terminal ileum demonstrates normal caliber. A transition zone is noted at the neck of the umbilical hernia. There is sigmoid diverticulosis without active inflammation.  The appendix is not visualized with certainty. No inflammatory changes identified in the right lower quadrant.  There is aortoiliac atherosclerotic disease. There is no lymphadenopathy.  There is a 2 cm peritoneal defect along the lateral pelvic wall with herniation of the pelvic fat. There is extensive degenerative changes of the spine. L3-L4 fixation screws as well as L4-5 disc spacer noted. There is grade 1 L4-5 anterolisthesis. The bones are osteopenic. Scattered lucency throughout the spine most compatible with osteopenia. Metastatic  disease is less likely. Simple correlation recommended. No acute fracture.  IMPRESSION: Small-bowel obstruction with transition zone at the level of the umbilicus secondary to herniation of the distal small bowel into the umbilical hernia. No inflammatory changes or fluid collection noted within the umbilical hernia. Clinical correlation and surgical consult is advised.   Electronically Signed   By: Anner Crete M.D.   On: 03/31/2015 23:39   Dg Chest 2 View  03/31/2015   CLINICAL DATA:  Low abdominal pain with nausea and vomiting for 2 days. History of hypertension.  EXAM: CHEST  2 VIEW  COMPARISON:  01/24/2009.  FINDINGS: The heart size and mediastinal contours are within normal limits. Both lungs are clear. No pleural effusion or pneumothorax. Bony thorax is demineralized but grossly intact  IMPRESSION: No active cardiopulmonary disease.   Electronically Signed   By: Lajean Manes M.D.   On: 03/31/2015 21:41   Dg Abd 2 Views  03/31/2015   CLINICAL DATA:  Lower abdominal pain for 2 days with vomiting and weakness.  EXAM: ABDOMEN - 2 VIEW  COMPARISON:  None.  FINDINGS: Moderately distended gas-filled loops of small bowel are seen within the left upper quadrant, with associated air-fluid levels, suggesting small bowel obstruction versus ileus. Additional scattered nonspecific air-fluid levels are also seen within the pelvis and right lower quadrant.  No evidence of free intraperitoneal air seen. Surgical clips in the right upper quadrant likely related to previous cholecystectomy.  Fixation hardware and disc spacers noted within the lower lumbar spine. Degenerative changes noted throughout the visualized thoracolumbar spine, moderate in degree, with scoliosis. No acute-appearing osseous abnormality.  IMPRESSION: Moderately distended gas-filled loops of small bowel within the left upper quadrant, with associated air-fluid levels, consistent with either a developing small bowel obstruction or ileus. Favor  mechanical small bowel obstruction.  Additional nonspecific air-fluid levels scattered within the pelvis and right lower quadrant.  These results were called by telephone at the time of interpretation on 03/31/2015 at 3:24 pm to Dr. Donavan Burnet , who verbally acknowledged these results.   Electronically Signed   By: Franki Cabot M.D.   On: 03/31/2015 15:24    Anti-infectives: Anti-infectives    None       Assessment/Plan  1. Incarcerated umbilical hernia, possible strangulation -give ARF would like to hold off on operation today to try and get this better.  However, her hernia is incarcerated again.  Unclear if this is bowel or omentum.  It was bowel on her CT scan last night.  We will give her some pain meds and try to get it reduced again to help prevent an emergent operation.  -cont NPO 2. ARF - I have called renal to see the patient for [place].  We suspect that this is secondary to dehydration, but even with fluids her Cr increased to 4.33 today.   -insert foley, renal US, UA completed, random Urine NA and creatinine are pending all per renal's request.  We appreciate their assistance with this patient 3.  HTN -cont beta blocker and prn hydralazine. -hold ace and Ca channel blocker for now -BP is stable 4. DVT prophylaxis SCDs/ lovenox DC due to elevated creatinine and incase she needs emergent surgery today  LOS: 0 days    Tamina Cyphers E 04/01/2015, 8:25 AM Pager: [phone]

## 2015-04-01 NOTE — Progress Notes (Signed)
Patient admitted to room [phone] via stretcher from ED. Alert and oriented x3. Denies pain.Oriented to room and call bell.

## 2015-04-01 NOTE — H&P (Signed)
Tabitha Black is an 81 y.o. female.   Chief Complaint: abdominal pain n/v for 2 days  HPI: asked to see pt at request of EDP for SBO on CT and incarcerated umbilical hernia.  Pt has a two day hx of N/V and abdominal pain.  Last BM today.  CT shows SBO with transition in umbilical hernia.  She has had this for 5 years.  Never caused her any problems.   Past Medical History  Diagnosis Date  . Hypertension   . High cholesterol     Past Surgical History  Procedure Laterality Date  . Back surgery      History reviewed. No pertinent family history. Social History:  reports that she has never smoked. She does not have any smokeless tobacco history on file. Her alcohol and drug histories are not on file.  Allergies:  Allergies  Allergen Reactions  . Other Swelling    Topical mycin, unsure of which one, caused opthalmic swelling  . Penicillins Shortness Of Breath, Itching, Swelling and Rash  . Lovastatin Other (See Comments)    Weakness and myalgias, maybe to statins?     (Not in a hospital admission)  Results for orders placed or performed during the hospital encounter of 03/31/15 (from the past 48 hour(s))  Lipase, blood     Status: Abnormal   Collection Time: 03/31/15  5:15 PM  Result Value Ref Range   Lipase 21 (L) 22 - 51 U/L  Comprehensive metabolic panel     Status: Abnormal   Collection Time: 03/31/15  5:15 PM  Result Value Ref Range   Sodium 136 135 - 145 mmol/L   Potassium 4.1 3.5 - 5.1 mmol/L   Chloride 91 (L) 101 - 111 mmol/L   CO2 25 22 - 32 mmol/L   Glucose, Bld 146 (H) 65 - 99 mg/dL   BUN 58 (H) 6 - 20 mg/dL   Creatinine, Ser 4.04 (H) 0.44 - 1.00 mg/dL   Calcium 10.6 (H) 8.9 - 10.3 mg/dL   Total Protein 7.9 6.5 - 8.1 g/dL   Albumin 4.4 3.5 - 5.0 g/dL   AST 33 15 - 41 U/L   ALT 22 14 - 54 U/L   Alkaline Phosphatase 61 38 - 126 U/L   Total Bilirubin 1.2 0.3 - 1.2 mg/dL   GFR calc non Af Amer 10 (L) >60 mL/min   GFR calc Af Amer 11 (L) >60 mL/min    Comment:  (NOTE) The eGFR has been calculated using the CKD EPI equation. This calculation has not been validated in all clinical situations. eGFR's persistently <60 mL/min signify possible Chronic Kidney Disease.    Anion gap 20 (H) 5 - 15  CBC     Status: None   Collection Time: 03/31/15  5:15 PM  Result Value Ref Range   WBC 6.6 4.0 - 10.5 K/uL   RBC 4.82 3.87 - 5.11 MIL/uL   Hemoglobin 14.3 12.0 - 15.0 g/dL   HCT 42.8 36.0 - 46.0 %   MCV 88.8 78.0 - 100.0 fL   MCH 29.7 26.0 - 34.0 pg   MCHC 33.4 30.0 - 36.0 g/dL   RDW 14.2 11.5 - 15.5 %   Platelets 376 150 - 400 K/uL   Ct Abdomen Pelvis Wo Contrast  03/31/2015   CLINICAL DATA:  81 year old female with lower abdominal AA is caps  EXAM: CT ABDOMEN AND PELVIS WITHOUT CONTRAST  TECHNIQUE: Multidetector CT imaging of the abdomen and pelvis was performed following the standard  protocol without IV contrast.  COMPARISON:  Radiograph dated 03/31/2015 and ultrasound dated 10/11/2013  FINDINGS: Evaluation of this exam is limited in the absence of intravenous contrast.  The visualized lung bases are clear. No intra-abdominal free air or free fluid.  Cholecystectomy. The liver, pancreas, spleen, right adrenal gland appear unremarkable. There is a 12 mm left adrenal adenomas. Mild bilateral renal atrophy. Lobulated appearance of the renal cortices. An exophytic right renal upper pole lesion as well as an ill-defined inferior pole hypodense lesions are not well characterized. Ultrasound is recommended for further evaluation of the kidneys. There is no hydronephrosis or nephrolithiasis on either side. The visualized ureters and urinary bladder appear unremarkable. A small left bladder wall diverticulitis noted. Hysterectomy.  There is a small umbilical hernia containing a short segment of the distal small bowel. Oral contrast is noted within the stomach and multiple loops of proximal and mid small bowel. There is dilatation of the proximal and mid bowel loops. The  terminal ileum demonstrates normal caliber. A transition zone is noted at the neck of the umbilical hernia. There is sigmoid diverticulosis without active inflammation. The appendix is not visualized with certainty. No inflammatory changes identified in the right lower quadrant.  There is aortoiliac atherosclerotic disease. There is no lymphadenopathy.  There is a 2 cm peritoneal defect along the lateral pelvic wall with herniation of the pelvic fat. There is extensive degenerative changes of the spine. L3-L4 fixation screws as well as L4-5 disc spacer noted. There is grade 1 L4-5 anterolisthesis. The bones are osteopenic. Scattered lucency throughout the spine most compatible with osteopenia. Metastatic disease is less likely. Simple correlation recommended. No acute fracture.  IMPRESSION: Small-bowel obstruction with transition zone at the level of the umbilicus secondary to herniation of the distal small bowel into the umbilical hernia. No inflammatory changes or fluid collection noted within the umbilical hernia. Clinical correlation and surgical consult is advised.   Electronically Signed   By: Anner Crete M.D.   On: 03/31/2015 23:39   Dg Chest 2 View  03/31/2015   CLINICAL DATA:  Low abdominal pain with nausea and vomiting for 2 days. History of hypertension.  EXAM: CHEST  2 VIEW  COMPARISON:  01/24/2009.  FINDINGS: The heart size and mediastinal contours are within normal limits. Both lungs are clear. No pleural effusion or pneumothorax. Bony thorax is demineralized but grossly intact  IMPRESSION: No active cardiopulmonary disease.   Electronically Signed   By: Lajean Manes M.D.   On: 03/31/2015 21:41   Dg Abd 2 Views  03/31/2015   CLINICAL DATA:  Lower abdominal pain for 2 days with vomiting and weakness.  EXAM: ABDOMEN - 2 VIEW  COMPARISON:  None.  FINDINGS: Moderately distended gas-filled loops of small bowel are seen within the left upper quadrant, with associated air-fluid levels, suggesting  small bowel obstruction versus ileus. Additional scattered nonspecific air-fluid levels are also seen within the pelvis and right lower quadrant.  No evidence of free intraperitoneal air seen. Surgical clips in the right upper quadrant likely related to previous cholecystectomy.  Fixation hardware and disc spacers noted within the lower lumbar spine. Degenerative changes noted throughout the visualized thoracolumbar spine, moderate in degree, with scoliosis. No acute-appearing osseous abnormality.  IMPRESSION: Moderately distended gas-filled loops of small bowel within the left upper quadrant, with associated air-fluid levels, consistent with either a developing small bowel obstruction or ileus. Favor mechanical small bowel obstruction.  Additional nonspecific air-fluid levels scattered within the pelvis and right lower  quadrant.  These results were called by telephone at the time of interpretation on 03/31/2015 at 3:24 pm to Dr. Donavan Burnet , who verbally acknowledged these results.   Electronically Signed   By: Franki Cabot M.D.   On: 03/31/2015 15:24    Review of Systems  Constitutional: Negative.   HENT: Negative.   Eyes: Negative.   Respiratory: Negative.   Cardiovascular: Negative.   Gastrointestinal: Positive for nausea, vomiting and abdominal pain.  Genitourinary: Negative.   Psychiatric/Behavioral: Negative.     Blood pressure 176/60, pulse 50, temperature 97.8 F (36.6 C), temperature source Oral, resp. rate 16, weight 92.08 kg (203 lb), SpO2 94 %. Physical Exam  Constitutional: She is oriented to person, place, and time. She appears well-developed and well-nourished.  HENT:  Head: Normocephalic and atraumatic.  Eyes: EOM are normal. Pupils are equal, round, and reactive to light.  Neck: Normal range of motion. Neck supple.  Cardiovascular: Normal rate and regular rhythm.   Respiratory: Effort normal and breath sounds normal.  GI: Soft. There is no tenderness. A hernia is  present.    Musculoskeletal: Normal range of motion.  Neurological: She is alert and oriented to person, place, and time.  Skin: Skin is warm and dry.  Psychiatric: She has a normal mood and affect. Her behavior is normal. Judgment and thought content normal.     Assessment/Plan SBO     secondary to incarcerated umbilical hernia reduced at the bedside in the ED  -  She will need this fixed during this admission  ARF       No hx of kidney disease        Will hydrate tonight   Probably secondary to dehydration  HTN           follow use hydralazine as needed  Keep NPO for now      Joziah Dollins A. 04/01/2015, 12:41 AM

## 2015-04-01 NOTE — Consult Note (Addendum)
Reason for Consult: Acute renal failure on chronic kidney disease stage III-IV Referring Physician: Erroll Luna M.D. (Surgery)  HPI:  81 year old Caucasian woman with past medical history significant for hypertension, dyslipidemia and an umbilical hernia who presented with abdominal pain associated with nausea and vomiting for 3 days prior to hospitalization. On admission, found to have small bowel obstruction secondary to incarcerated umbilical hernia and also noted to be in renal failure with a creatinine of 4.0 which overnight after fluids has risen to 4.3 raising concern as it causes delay of surgery. It appears that intravenous fluids was started at 1 AM and labs this morning checked at 5 AM (500 mL of fluid given by then). Urine output recorded at 300 mL overnight-now with Foley catheter in situ.  Workup so far reveals a bland sediment of her urinalysis and a fractional excretion of sodium of < 1%. Prior to admission, she was not taking any NSAIDs, she has not had any intravenous iodinated contrast exposure but was taking Maxide for hypertension and fenofibrate for dyslipidemia. She denies history of recurrent kidney stones or urinary tract infections. It appears that her creatinine in July, 2016 was 1.7 (EGFR 29 mL/minute)-she reports to have been informed of advancing kidney disease.  She stopped taking Maxide (and all her other medications) on Saturday because of intractable nausea and vomiting. Reports poor urine output since then.    Past Medical History  Diagnosis Date  . Hypertension   . High cholesterol     Past Surgical History  Procedure Laterality Date  . Back surgery      History reviewed. No pertinent family history.  Social History:  reports that she has never smoked. She does not have any smokeless tobacco history on file. Her alcohol and drug histories are not on file.  Allergies:  Allergies  Allergen Reactions  . Other Swelling    Topical mycin, unsure of  which one, caused opthalmic swelling  . Penicillins Shortness Of Breath, Itching, Swelling and Rash  . Lovastatin Other (See Comments)    Weakness and myalgias, maybe to statins?    Medications:  Scheduled: . antiseptic oral rinse  7 mL Mouth Rinse BID  . metoprolol succinate  100 mg Oral QPM    BMP Latest Ref Rng 04/01/2015 03/31/2015 01/29/2009  Glucose 65 - 99 mg/dL 150(H) 146(H) 117(H)  BUN 6 - 20 mg/dL 63(H) 58(H) 30(H)  Creatinine 0.44 - 1.00 mg/dL 4.33(H) 4.04(H) 1.45(H)  Sodium 135 - 145 mmol/L 135 136 135  Potassium 3.5 - 5.1 mmol/L 4.2 4.1 4.7  Chloride 101 - 111 mmol/L 93(L) 91(L) 103  CO2 22 - 32 mmol/L _0 Calcium 8.9 - 10.3 mg/dL 9.7 10.6(H) 9.5   CBC Latest Ref Rng 04/01/2015 03/31/2015 01/24/2009  WBC 4.0 - 10.5 K/uL 6.9 6.6 7.0  Hemoglobin 12.0 - 15.0 g/dL 13.0 14.3 12.3  Hematocrit 36.0 - 46.0 % 39.4 42.8 35.0(L)  Platelets 150 - 400 K/uL 340 376 202   Urine dipstick shows: negative hemoglobin, negative glucose and 30 of protein (specific gravity 1020).  Micro exam: Hyaline casts, 3-6 WBCs/HPF and 0-2 RBCs/HPF.  Fractional excretion of sodium 0.1%   Ct Abdomen Pelvis Wo Contrast  03/31/2015   CLINICAL DATA:  81 year old female with lower abdominal AA is caps  EXAM: CT ABDOMEN AND PELVIS WITHOUT CONTRAST  TECHNIQUE: Multidetector CT imaging of the abdomen and pelvis was performed following the standard protocol without IV contrast.  COMPARISON:  Radiograph dated 03/31/2015 and ultrasound  dated 10/11/2013  FINDINGS: Evaluation of this exam is limited in the absence of intravenous contrast.  The visualized lung bases are clear. No intra-abdominal free air or free fluid.  Cholecystectomy. The liver, pancreas, spleen, right adrenal gland appear unremarkable. There is a 12 mm left adrenal adenomas. Mild bilateral renal atrophy. Lobulated appearance of the renal cortices. An exophytic right renal upper pole lesion as well as an ill-defined inferior pole hypodense lesions  are not well characterized. Ultrasound is recommended for further evaluation of the kidneys. There is no hydronephrosis or nephrolithiasis on either side. The visualized ureters and urinary bladder appear unremarkable. A small left bladder wall diverticulitis noted. Hysterectomy.  There is a small umbilical hernia containing a short segment of the distal small bowel. Oral contrast is noted within the stomach and multiple loops of proximal and mid small bowel. There is dilatation of the proximal and mid bowel loops. The terminal ileum demonstrates normal caliber. A transition zone is noted at the neck of the umbilical hernia. There is sigmoid diverticulosis without active inflammation. The appendix is not visualized with certainty. No inflammatory changes identified in the right lower quadrant.  There is aortoiliac atherosclerotic disease. There is no lymphadenopathy.  There is a 2 cm peritoneal defect along the lateral pelvic wall with herniation of the pelvic fat. There is extensive degenerative changes of the spine. L3-L4 fixation screws as well as L4-5 disc spacer noted. There is grade 1 L4-5 anterolisthesis. The bones are osteopenic. Scattered lucency throughout the spine most compatible with osteopenia. Metastatic disease is less likely. Simple correlation recommended. No acute fracture.  IMPRESSION: Small-bowel obstruction with transition zone at the level of the umbilicus secondary to herniation of the distal small bowel into the umbilical hernia. No inflammatory changes or fluid collection noted within the umbilical hernia. Clinical correlation and surgical consult is advised.   Electronically Signed   By: Anner Crete M.D.   On: 03/31/2015 23:39   Dg Chest 2 View  03/31/2015   CLINICAL DATA:  Low abdominal pain with nausea and vomiting for 2 days. History of hypertension.  EXAM: CHEST  2 VIEW  COMPARISON:  01/24/2009.  FINDINGS: The heart size and mediastinal contours are within normal limits. Both  lungs are clear. No pleural effusion or pneumothorax. Bony thorax is demineralized but grossly intact  IMPRESSION: No active cardiopulmonary disease.   Electronically Signed   By: Lajean Manes M.D.   On: 03/31/2015 21:41   Dg Abd 2 Views  03/31/2015   CLINICAL DATA:  Lower abdominal pain for 2 days with vomiting and weakness.  EXAM: ABDOMEN - 2 VIEW  COMPARISON:  None.  FINDINGS: Moderately distended gas-filled loops of small bowel are seen within the left upper quadrant, with associated air-fluid levels, suggesting small bowel obstruction versus ileus. Additional scattered nonspecific air-fluid levels are also seen within the pelvis and right lower quadrant.  No evidence of free intraperitoneal air seen. Surgical clips in the right upper quadrant likely related to previous cholecystectomy.  Fixation hardware and disc spacers noted within the lower lumbar spine. Degenerative changes noted throughout the visualized thoracolumbar spine, moderate in degree, with scoliosis. No acute-appearing osseous abnormality.  IMPRESSION: Moderately distended gas-filled loops of small bowel within the left upper quadrant, with associated air-fluid levels, consistent with either a developing small bowel obstruction or ileus. Favor mechanical small bowel obstruction.  Additional nonspecific air-fluid levels scattered within the pelvis and right lower quadrant.  These results were called by telephone at the time of  interpretation on 03/31/2015 at 3:24 pm to Dr. Donavan Burnet , who verbally acknowledged these results.   Electronically Signed   By: Franki Cabot M.D.   On: 03/31/2015 15:24    Review of Systems  Constitutional: Negative.   HENT: Negative.   Eyes: Negative.   Respiratory: Negative.   Cardiovascular: Negative.   Gastrointestinal: Positive for nausea, vomiting and abdominal pain.  Musculoskeletal: Negative.   Skin: Negative.   Neurological: Negative.   Psychiatric/Behavioral: Negative.    Blood pressure  143/58, pulse 97, temperature 98.3 F (36.8 C), temperature source Oral, resp. rate 18, height 5' 3" (1.6 m), weight 99.3 kg (218 lb 14.7 oz), SpO2 95 %. Physical Exam  Nursing note and vitals reviewed. Constitutional: She is oriented to person, place, and time. She appears well-developed and well-nourished. No distress.  HENT:  Head: Normocephalic and atraumatic.  Nose: Nose normal.  Dry oral mucosa/oropharynx  Eyes: EOM are normal. Pupils are equal, round, and reactive to light. No scleral icterus.  Neck: Normal range of motion. Neck supple. No JVD present. No thyromegaly present.  Cardiovascular: Normal rate, regular rhythm and normal heart sounds.  Exam reveals no friction rub.   Respiratory: Effort normal and breath sounds normal. She has no wheezes. She has no rales.  GI: Soft. There is tenderness. There is guarding.  Periumbilical tenderness  Musculoskeletal: Normal range of motion. She exhibits no edema.  Neurological: She is alert and oriented to person, place, and time.  Skin: Skin is warm and dry. No rash noted. No erythema.    Assessment/Plan:  1. Acute renal failure on chronic kidney disease stage III-IV: Suspected to be hemodynamically mediated with recent small bowel obstruction in the face of ongoing diuretic therapy with Maxzide. Fractional excretion of sodium is low and consistent with intravascular volume contraction. Recommend for continuing fluids and monitoring for improvement. No acute electrolyte abnormalities to prompt need for hemodialysis at this point. Unfortunately-marginal urine output charted overnight that may point towards progression into ATN. Renal ultrasound pending of the abdominal CT scan did not show any obvious hydronephrosis. Will not undertake screening for glomerulonephritis given mechanism of injury and urinalysis findings so far. I agree with holding her Maxide at this time-would be okay to restart amlodipine in addition to metoprolol for blood  pressure control. I will recheck labs again this afternoon to follow up in any needs for intervention. Continue to avoid iodinated contrast and NSAIDs-limit hypotension. 2. Small bowel obstruction/incarcerated umbilical hernia: Originally planned for surgery today however postponed because of worsening renal function 3. Hypertension: BP on the higher side but tolerable for now- okay to start norvasc post op- on metoprolol and PRN hydralazine 4. Hyponatremia/hypochloremia: mild and secondary to impaired free water handling in the face of AKI. Monitor on isotonic fluids  PATEL,JAY K. 04/01/2015, 12:10 PM

## 2015-04-02 ENCOUNTER — Inpatient Hospital Stay (HOSPITAL_COMMUNITY): Payer: Medicare Other | Admitting: Anesthesiology

## 2015-04-02 ENCOUNTER — Encounter (HOSPITAL_COMMUNITY): Admission: EM | Disposition: A | Discharge status: Home / Self Care | Payer: Self-pay | Source: Home / Self Care

## 2015-04-02 ENCOUNTER — Encounter (HOSPITAL_COMMUNITY): Payer: Self-pay | Admitting: Anesthesiology

## 2015-04-02 HISTORY — PX: UMBILICAL HERNIA REPAIR: SHX196

## 2015-04-02 LAB — BASIC METABOLIC PANEL
Anion gap: 9 (ref 5–15)
BUN: 62 mg/dL — ABNORMAL HIGH (ref 6–20)
CO2: 25 mmol/L (ref 22–32)
Calcium: 8.6 mg/dL — ABNORMAL LOW (ref 8.9–10.3)
Chloride: 104 mmol/L (ref 101–111)
Creatinine, Ser: 3.44 mg/dL — ABNORMAL HIGH (ref 0.44–1.00)
GFR calc Af Amer: 13 mL/min — ABNORMAL LOW (ref >60)
GFR calc non Af Amer: 12 mL/min — ABNORMAL LOW (ref >60)
Glucose, Bld: 109 mg/dL — ABNORMAL HIGH (ref 65–99)
Potassium: 3.6 mmol/L (ref 3.5–5.1)
Sodium: 138 mmol/L (ref 135–145)

## 2015-04-02 LAB — SURGICAL PCR SCREEN
MRSA, PCR: NEGATIVE
Staphylococcus aureus: NEGATIVE

## 2015-04-02 SURGERY — REPAIR, HERNIA, UMBILICAL, LAPAROSCOPIC
Anesthesia: General | Site: Abdomen

## 2015-04-02 MED ORDER — PROPOFOL 10 MG/ML IV BOLUS
INTRAVENOUS | Status: DC | PRN
  Administered 2015-04-02: 130 mg via INTRAVENOUS

## 2015-04-02 MED ORDER — ENOXAPARIN SODIUM 40 MG/0.4ML ~~LOC~~ SOLN
40.0000 mg | SUBCUTANEOUS | Status: DC
  Administered 2015-04-03: 40 mg via SUBCUTANEOUS
  Filled 2015-04-02: qty 0.4

## 2015-04-02 MED ORDER — SUGAMMADEX SODIUM 500 MG/5ML IV SOLN
INTRAVENOUS | Status: AC
  Filled 2015-04-02: qty 5

## 2015-04-02 MED ORDER — LIDOCAINE HCL (CARDIAC) 20 MG/ML IV SOLN
INTRAVENOUS | Status: DC | PRN
  Administered 2015-04-02: 20 mg via INTRAVENOUS

## 2015-04-02 MED ORDER — SUCCINYLCHOLINE CHLORIDE 20 MG/ML IJ SOLN
INTRAMUSCULAR | Status: AC
  Filled 2015-04-02: qty 1

## 2015-04-02 MED ORDER — LACTATED RINGERS IV SOLN: INTRAVENOUS | Status: DC

## 2015-04-02 MED ORDER — BUPIVACAINE HCL (PF) 0.25 % IJ SOLN
INTRAMUSCULAR | Status: AC
  Filled 2015-04-02: qty 30

## 2015-04-02 MED ORDER — CLINDAMYCIN PHOSPHATE 900 MG/50ML IV SOLN
900.0000 mg | INTRAVENOUS | Status: AC
  Administered 2015-04-02: 900 mg via INTRAVENOUS
  Filled 2015-04-02 (×4): qty 50

## 2015-04-02 MED ORDER — ROCURONIUM BROMIDE 100 MG/10ML IV SOLN
INTRAVENOUS | Status: DC | PRN
  Administered 2015-04-02: 25 mg via INTRAVENOUS

## 2015-04-02 MED ORDER — PHENYLEPHRINE 40 MCG/ML (10ML) SYRINGE FOR IV PUSH (FOR BLOOD PRESSURE SUPPORT)
PREFILLED_SYRINGE | INTRAVENOUS | Status: AC
  Filled 2015-04-02: qty 20

## 2015-04-02 MED ORDER — FENTANYL CITRATE (PF) 250 MCG/5ML IJ SOLN
INTRAMUSCULAR | Status: AC
  Filled 2015-04-02: qty 5

## 2015-04-02 MED ORDER — ONDANSETRON HCL 4 MG/2ML IJ SOLN
INTRAMUSCULAR | Status: AC
  Filled 2015-04-02: qty 2

## 2015-04-02 MED ORDER — HYDROMORPHONE HCL 1 MG/ML IJ SOLN: 0.2500 mg | INTRAMUSCULAR | Status: DC | PRN

## 2015-04-02 MED ORDER — SODIUM CHLORIDE 0.9 % IR SOLN
Status: DC | PRN
  Administered 2015-04-02: 1000 mL

## 2015-04-02 MED ORDER — HYDROMORPHONE HCL 1 MG/ML IJ SOLN
0.5000 mg | INTRAMUSCULAR | Status: DC | PRN
  Administered 2015-04-02: 2 mg via INTRAVENOUS
  Administered 2015-04-03 (×2): 1 mg via INTRAVENOUS
  Filled 2015-04-02: qty 1
  Filled 2015-04-02: qty 2
  Filled 2015-04-02: qty 1

## 2015-04-02 MED ORDER — LACTATED RINGERS IV SOLN: INTRAVENOUS | Status: DC | PRN

## 2015-04-02 MED ORDER — SODIUM CHLORIDE 0.9 % IV SOLN
10.0000 mg | INTRAVENOUS | Status: DC | PRN
  Administered 2015-04-02: 25 ug/min via INTRAVENOUS

## 2015-04-02 MED ORDER — MENTHOL 3 MG MT LOZG
1.0000 | LOZENGE | OROMUCOSAL | Status: DC | PRN
  Filled 2015-04-02: qty 9

## 2015-04-02 MED ORDER — LIDOCAINE HCL (CARDIAC) 20 MG/ML IV SOLN
INTRAVENOUS | Status: AC
  Filled 2015-04-02: qty 5

## 2015-04-02 MED ORDER — SUCCINYLCHOLINE CHLORIDE 20 MG/ML IJ SOLN
INTRAMUSCULAR | Status: DC | PRN
  Administered 2015-04-02: 100 mg via INTRAVENOUS

## 2015-04-02 MED ORDER — ROCURONIUM BROMIDE 50 MG/5ML IV SOLN
INTRAVENOUS | Status: AC
  Filled 2015-04-02: qty 1

## 2015-04-02 MED ORDER — PROMETHAZINE HCL 25 MG/ML IJ SOLN: 6.2500 mg | INTRAMUSCULAR | Status: DC | PRN

## 2015-04-02 MED ORDER — FENTANYL CITRATE (PF) 100 MCG/2ML IJ SOLN
INTRAMUSCULAR | Status: DC | PRN
  Administered 2015-04-02 (×2): 50 ug via INTRAVENOUS

## 2015-04-02 MED ORDER — PROPOFOL 10 MG/ML IV BOLUS
INTRAVENOUS | Status: AC
  Filled 2015-04-02: qty 20

## 2015-04-02 MED ORDER — BUPIVACAINE HCL 0.25 % IJ SOLN
INTRAMUSCULAR | Status: DC | PRN
  Administered 2015-04-02: 30 mL

## 2015-04-02 MED ORDER — SUGAMMADEX SODIUM 200 MG/2ML IV SOLN
INTRAVENOUS | Status: DC | PRN
  Administered 2015-04-02: 400 mg via INTRAVENOUS

## 2015-04-02 MED ORDER — SODIUM CHLORIDE 0.9 % IV SOLN
INTRAVENOUS | Status: DC
  Administered 2015-04-02: 12:00:00 via INTRAVENOUS

## 2015-04-02 SURGICAL SUPPLY — 48 items
APL SKNCLS STERI-STRIP NONHPOA (GAUZE/BANDAGES/DRESSINGS) ×1
APPLIER CLIP LOGIC TI 5 (MISCELLANEOUS) IMPLANT
APPLIER CLIP ROT 10 11.4 M/L (STAPLE)
APR CLP MED LRG 11.4X10 (STAPLE)
APR CLP MED LRG 33X5 (MISCELLANEOUS)
BENZOIN TINCTURE PRP APPL 2/3 (GAUZE/BANDAGES/DRESSINGS) ×2 IMPLANT
BLADE SURG ROTATE 9660 (MISCELLANEOUS) IMPLANT
CANISTER SUCTION 2500CC (MISCELLANEOUS) IMPLANT
CHLORAPREP W/TINT 26ML (MISCELLANEOUS) ×2 IMPLANT
CLIP APPLIE ROT 10 11.4 M/L (STAPLE) IMPLANT
COVER SURGICAL LIGHT HANDLE (MISCELLANEOUS) ×2 IMPLANT
DEVICE SECURE STRAP 25 ABSORB (INSTRUMENTS) ×2 IMPLANT
DEVICE TROCAR PUNCTURE CLOSURE (ENDOMECHANICALS) ×2 IMPLANT
ELECT REM PT RETURN 9FT ADLT (ELECTROSURGICAL) ×2
ELECTRODE REM PT RTRN 9FT ADLT (ELECTROSURGICAL) ×1 IMPLANT
GAUZE SPONGE 2X2 8PLY STRL LF (GAUZE/BANDAGES/DRESSINGS) IMPLANT
GAUZE SPONGE 4X4 12PLY STRL (GAUZE/BANDAGES/DRESSINGS) ×2 IMPLANT
GLOVE BIO SURGEON STRL SZ7.5 (GLOVE) ×2 IMPLANT
GOWN STRL REUS W/ TWL LRG LVL3 (GOWN DISPOSABLE) ×2 IMPLANT
GOWN STRL REUS W/ TWL XL LVL3 (GOWN DISPOSABLE) ×1 IMPLANT
GOWN STRL REUS W/TWL LRG LVL3 (GOWN DISPOSABLE) ×4
GOWN STRL REUS W/TWL XL LVL3 (GOWN DISPOSABLE) ×2
KIT BASIN OR (CUSTOM PROCEDURE TRAY) ×2 IMPLANT
KIT ROOM TURNOVER OR (KITS) ×2 IMPLANT
MARKER SKIN DUAL TIP RULER LAB (MISCELLANEOUS) ×2 IMPLANT
MESH VENTRALIGHT ST 4.5IN (Mesh General) ×1 IMPLANT
NDL SPNL 22GX3.5 QUINCKE BK (NEEDLE) IMPLANT
NEEDLE INSUFFLATION 14GA 120MM (NEEDLE) ×2 IMPLANT
NEEDLE SPNL 22GX3.5 QUINCKE BK (NEEDLE) IMPLANT
NS IRRIG 1000ML POUR BTL (IV SOLUTION) ×2 IMPLANT
PAD ARMBOARD 7.5X6 YLW CONV (MISCELLANEOUS) ×4 IMPLANT
SCISSORS LAP 5X35 DISP (ENDOMECHANICALS) ×2 IMPLANT
SET IRRIG TUBING LAPAROSCOPIC (IRRIGATION / IRRIGATOR) IMPLANT
SLEEVE ENDOPATH XCEL 5M (ENDOMECHANICALS) ×2 IMPLANT
SPONGE GAUZE 2X2 STER 10/PKG (GAUZE/BANDAGES/DRESSINGS) ×2
STRIP CLOSURE SKIN 1/2X4 (GAUZE/BANDAGES/DRESSINGS) ×2 IMPLANT
SUT CHROMIC 2 0 SH (SUTURE) ×2 IMPLANT
SUT MNCRL AB 4-0 PS2 18 (SUTURE) ×2 IMPLANT
SUT PROLENE 2 0 KS (SUTURE) IMPLANT
TAPE CLOTH SURG 4X10 WHT LF (GAUZE/BANDAGES/DRESSINGS) ×2 IMPLANT
TOWEL OR 17X24 6PK STRL BLUE (TOWEL DISPOSABLE) ×2 IMPLANT
TOWEL OR 17X26 10 PK STRL BLUE (TOWEL DISPOSABLE) ×2 IMPLANT
TRAY FOLEY CATH 14FR (SET/KITS/TRAYS/PACK) IMPLANT
TRAY LAPAROSCOPIC MC (CUSTOM PROCEDURE TRAY) ×2 IMPLANT
TROCAR XCEL BLUNT TIP 100MML (ENDOMECHANICALS) IMPLANT
TROCAR XCEL NON-BLD 11X100MML (ENDOMECHANICALS) IMPLANT
TROCAR XCEL NON-BLD 5MMX100MML (ENDOMECHANICALS) ×2 IMPLANT
TUBING INSUFFLATION (TUBING) ×2 IMPLANT

## 2015-04-02 NOTE — Anesthesia Postprocedure Evaluation (Signed)
  Anesthesia Post-op Note  Patient: Tabitha Black  Procedure(s) Performed: Procedure(s): LAPAROSCOPIC UMBILICAL HERNIA REPAIR WITH VENTRALIGHT MESH (N/A)  Patient Location: PACU  Anesthesia Type: General   Level of Consciousness: awake, alert  and oriented  Airway and Oxygen Therapy: Patient Spontanous Breathing  Post-op Pain: mild  Post-op Assessment: Post-op Vital signs reviewed  Post-op Vital Signs: Reviewed  Last Vitals:  Filed Vitals:   04/02/15 1800  BP: 139/44  Pulse: 68  Temp: 36.6 C  Resp: 18    Complications: No apparent anesthesia complications

## 2015-04-02 NOTE — Anesthesia Procedure Notes (Signed)
Procedure Name: Intubation Date/Time: 04/02/2015 12:41 PM Performed by: Rush Farmer E Pre-anesthesia Checklist: Patient identified, Emergency Drugs available, Suction available, Patient being monitored and Timeout performed Patient Re-evaluated:Patient Re-evaluated prior to inductionOxygen Delivery Method: Circle system utilized Preoxygenation: Pre-oxygenation with 100% oxygen Intubation Type: IV induction and Rapid sequence Laryngoscope Size: Mac and 3 Grade View: Grade III Tube type: Oral Tube size: 7.0 mm Number of attempts: 1 Airway Equipment and Method: Bougie stylet Placement Confirmation: positive ETCO2 and breath sounds checked- equal and bilateral Secured at: 22 cm Tube secured with: Tape Dental Injury: Teeth and Oropharynx as per pre-operative assessment

## 2015-04-02 NOTE — Progress Notes (Signed)
Patient ID: Tabitha Black, female   DOB: 1932-08-07, 81 y.o.   MRN: [id number]  [facility] Progress Note   Assessment/ Plan:   1. Acute renal failure on chronic kidney disease stage III-IV: Hemodynamically mediated acute renal failure with small bowel obstruction in the face of ongoing diuretic therapy-further proven by a low fractional excretion of sodium. Responding well to fluids and will continue to monitor for ongoing renal recovery-excellent urine output, stable electrolytes, no uremic symptoms and no indications for dialysis.  2. Small bowel obstruction/incarcerated umbilical hernia: For laparoscopic hernia repair today at 1 PM-still having some abdominal soreness 3. Hypertension: BP on the higher side but tolerable for now- okay to start norvasc post op- on metoprolol and PRN hydralazine 4. Hyponatremia/hypochloremia: mild and secondary to impaired free water handling in the face of AKI. Monitor on isotonic fluids  Subjective:   Reports to be feeling fair other than abdominal discomfort    Objective:   BP 144/54 mmHg  Pulse 67  Temp(Src) 98.5 F (36.9 C) (Oral)  Resp 18  Ht 5\' 3"  (1.6 m)  Wt 99.3 kg (218 lb 14.7 oz)  BMI 38.79 kg/m2  SpO2 98%  Intake/Output Summary (Last 24 hours) at 04/02/15 1127 Last data filed at 04/02/15 0600  Gross per 24 hour  Intake 2798.74 ml  Output   1650 ml  Net 1148.74 ml   Weight change:   Physical Exam: Gen: Comfortably resting in a recliner, husband at bedside CVS: Pulse regular in rate and rhythm, S1 and S2 normal Resp: Decreased breath sounds over bases-poor inspiratory effort Abd: Soft, obese, tender over periumbilical area Ext: No lower extremity edema  Imaging: Ct Abdomen Pelvis Wo Contrast  03/31/2015   CLINICAL DATA:  81 year old female with lower abdominal AA is caps  EXAM: CT ABDOMEN AND PELVIS WITHOUT CONTRAST  TECHNIQUE: Multidetector CT imaging of the abdomen and pelvis was performed following the standard  protocol without IV contrast.  COMPARISON:  Radiograph dated 03/31/2015 and ultrasound dated 10/11/2013  FINDINGS: Evaluation of this exam is limited in the absence of intravenous contrast.  The visualized lung bases are clear. No intra-abdominal free air or free fluid.  Cholecystectomy. The liver, pancreas, spleen, right adrenal gland appear unremarkable. There is a 12 mm left adrenal adenomas. Mild bilateral renal atrophy. Lobulated appearance of the renal cortices. An exophytic right renal upper pole lesion as well as an ill-defined inferior pole hypodense lesions are not well characterized. Ultrasound is recommended for further evaluation of the kidneys. There is no hydronephrosis or nephrolithiasis on either side. The visualized ureters and urinary bladder appear unremarkable. A small left bladder wall diverticulitis noted. Hysterectomy.  There is a small umbilical hernia containing a short segment of the distal small bowel. Oral contrast is noted within the stomach and multiple loops of proximal and mid small bowel. There is dilatation of the proximal and mid bowel loops. The terminal ileum demonstrates normal caliber. A transition zone is noted at the neck of the umbilical hernia. There is sigmoid diverticulosis without active inflammation. The appendix is not visualized with certainty. No inflammatory changes identified in the right lower quadrant.  There is aortoiliac atherosclerotic disease. There is no lymphadenopathy.  There is a 2 cm peritoneal defect along the lateral pelvic wall with herniation of the pelvic fat. There is extensive degenerative changes of the spine. L3-L4 fixation screws as well as L4-5 disc spacer noted. There is grade 1 L4-5 anterolisthesis. The bones are osteopenic. Scattered lucency throughout the  spine most compatible with osteopenia. Metastatic disease is less likely. Simple correlation recommended. No acute fracture.  IMPRESSION: Small-bowel obstruction with transition zone at  the level of the umbilicus secondary to herniation of the distal small bowel into the umbilical hernia. No inflammatory changes or fluid collection noted within the umbilical hernia. Clinical correlation and surgical consult is advised.   Electronically Signed   By: Anner Crete M.D.   On: 03/31/2015 23:39   Dg Chest 2 View  03/31/2015   CLINICAL DATA:  Low abdominal pain with nausea and vomiting for 2 days. History of hypertension.  EXAM: CHEST  2 VIEW  COMPARISON:  01/24/2009.  FINDINGS: The heart size and mediastinal contours are within normal limits. Both lungs are clear. No pleural effusion or pneumothorax. Bony thorax is demineralized but grossly intact  IMPRESSION: No active cardiopulmonary disease.   Electronically Signed   By: Lajean Manes M.D.   On: 03/31/2015 21:41   US Renal  04/01/2015   CLINICAL DATA:  Patient with acute renal failure.  EXAM: RENAL / URINARY TRACT ULTRASOUND COMPLETE  COMPARISON:  CT abdomen pelvis 03/31/2015  FINDINGS: Right Kidney:  Length: 11.3 cm. No hydronephrosis. Renal cortical thinning. Increased renal cortical echogenicity. Multiple hypoechoic lesions are demonstrated within the right renal cortex measuring 1.1 cm and 1.3 cm respectively.  Left Kidney:  Length: 10.3 cm. No hydronephrosis. Renal cortical thinning. Increased renal cortical echogenicity. 1.0 cm hypoechoic lesion within the interpolar region, favored to represent a small cyst.  Bladder:  Decompressed with Foley catheter.  IMPRESSION: No hydronephrosis.  Multiple hypoechoic lesions, predominately within the right kidney, which do not meet criteria for simple cysts. Recommend dedicated evaluation with pre and post contrast-enhanced MRI in the non acute setting.  Increased renal cortical echogenicity as can be seen with chronic medical renal disease.   Electronically Signed   By: Lovey Newcomer M.D.   On: 04/01/2015 19:45   Dg Abd 2 Views  03/31/2015   CLINICAL DATA:  Lower abdominal pain for 2 days with  vomiting and weakness.  EXAM: ABDOMEN - 2 VIEW  COMPARISON:  None.  FINDINGS: Moderately distended gas-filled loops of small bowel are seen within the left upper quadrant, with associated air-fluid levels, suggesting small bowel obstruction versus ileus. Additional scattered nonspecific air-fluid levels are also seen within the pelvis and right lower quadrant.  No evidence of free intraperitoneal air seen. Surgical clips in the right upper quadrant likely related to previous cholecystectomy.  Fixation hardware and disc spacers noted within the lower lumbar spine. Degenerative changes noted throughout the visualized thoracolumbar spine, moderate in degree, with scoliosis. No acute-appearing osseous abnormality.  IMPRESSION: Moderately distended gas-filled loops of small bowel within the left upper quadrant, with associated air-fluid levels, consistent with either a developing small bowel obstruction or ileus. Favor mechanical small bowel obstruction.  Additional nonspecific air-fluid levels scattered within the pelvis and right lower quadrant.  These results were called by telephone at the time of interpretation on 03/31/2015 at 3:24 pm to Dr. Donavan Burnet , who verbally acknowledged these results.   Electronically Signed   By: Franki Cabot M.D.   On: 03/31/2015 15:24    Labs: BMET  Recent Labs Lab 03/31/15 1715 04/01/15 0501 04/01/15 1448 04/02/15 0324  NA 136 135 136 138  K 4.1 4.2 3.7 3.6  CL 91* 93* 99* 104  CO2 25 28 26 25   GLUCOSE 146* 150* 108* 109*  BUN 58* 63* 68* 62*  CREATININE 4.04* 4.33* 4.04*  3.44*  CALCIUM 10.6* 9.7 8.5* 8.6*   CBC  Recent Labs Lab 03/31/15 1715 04/01/15 0501  WBC 6.6 6.9  HGB 14.3 13.0  HCT 42.8 39.4  MCV 88.8 88.9  PLT 376 340    Medications:    . antiseptic oral rinse  7 mL Mouth Rinse BID  . clindamycin (CLEOCIN) IV  900 mg Intravenous To SS-Surg  . metoprolol succinate  100 mg Oral QPM      Elmarie Shiley, MD 04/02/2015, 11:27 AM

## 2015-04-02 NOTE — Op Note (Signed)
04/02/2015  3:11 PM  PATIENT:  Tabitha Black  81 y.o. female  PRE-OPERATIVE DIAGNOSIS:  INCARCERATED UMBILICAL HERNIA  POST-OPERATIVE DIAGNOSIS:  INCARCERATED UMBILICAL HERNIA  PROCEDURE:  Procedure(s): LAPAROSCOPIC UMBILICAL HERNIA REPAIR WITH VENTRALIGHT MESH (N/A)  SURGEON:  Surgeon(s) and Role:    * Ralene Ok, MD - Primary  ANESTHESIA:   local and general  EBL: <5cc Total I/O In: 400 [I.V.:400] Out: 160 [Urine:150; Blood:10]  BLOOD ADMINISTERED:none  DRAINS: none   LOCAL MEDICATIONS USED:  BUPIVICAINE   SPECIMEN:  No Specimen  DISPOSITION OF SPECIMEN:  N/A  COUNTS:  YES  TOURNIQUET:  * No tourniquets in log *  DICTATION: .Dragon Dictation  Details of the procedure:   After the patient was consented patient was taken back to the operating room patient was then placed in supine position bilateral SCDs in place.  The patient was prepped and draped in the usual sterile fashion. After antibiotics were confirmed a timeout was called and all facts were verified. The Veress needle technique was used to insuflate the abdomen at Palmer's point. The abdomen was insufflated to 14 mm mercury. Subsequently a 5 mm trocar was placed a camera inserted there was no injury to any intra-abdominal organs.    There was seen to be an incarcerated umbilical hernia, with incarcerated omentum.  A second camera port was in placed into the left lower quadrant.   At this the Falicform ligament was taken down with Bovie cautery maintaining hemostasis.   I proceeded to reduce the hernia contents.  Once the hernia was cleared away and seen to be about 1.5cm in size, a Bard Ventralight 11.4cm  mesh was inserted into the abdomen.  The mesh was secured circumferentially with am Securestrap tacker in a double crown fashion.  2-0 prolenes were used at 3:00, 6:00, 9:00, and 12:00 as transfascial sutures using a endoclose decive.    The omentum was brought over the area of the mesh. The  pneumoperitoneum was evacuated  & all trocars  were removed. The skin was reapproximated with 4-0  Monocryl sutures in a subcuticular fashion. The skin was dressed with Steri-Strips tape and gauze.  The patient was taken to the recovery room in stable condition.   PLAN OF CARE: Admit to inpatient   PATIENT DISPOSITION:  PACU - hemodynamically stable.   Delay start of Pharmacological VTE agent (>24hrs) due to surgical blood loss or risk of bleeding: no

## 2015-04-02 NOTE — Clinical Documentation Improvement (Addendum)
Nephrology/Renal  In order to assist with accurate coding assignment, please document in the progress notes (not on the query form) if the diagnosis of "ATN" has been:   - Ruled out and not applicable to this admission  - Confirmed and is applicable to this admission  - Unable to clinically determine  Clinical Information: "Unfortunately-marginal urine output charted overnight that may point towards progression into ATN" documented by Dr. Posey Pronto in Nephrology consult dated 04/01/15.   Please exercise your independent, professional judgment when responding. A specific answer is not anticipated or expected.   Thank You, Erling Conte  RN BSN [place] [phone]

## 2015-04-02 NOTE — Transfer of Care (Signed)
Immediate Anesthesia Transfer of Care Note  Patient: Tabitha Black  Procedure(s) Performed: Procedure(s): LAPAROSCOPIC UMBILICAL HERNIA REPAIR WITH VENTRALIGHT MESH (N/A)  Patient Location: PACU  Anesthesia Type:General  Level of Consciousness: awake, alert  and oriented  Airway & Oxygen Therapy: Patient Spontanous Breathing and Patient connected to nasal cannula oxygen  Post-op Assessment: Report given to RN, Post -op Vital signs reviewed and stable and Patient moving all extremities X 4  Post vital signs: Reviewed and stable  Last Vitals:  Filed Vitals:   04/02/15 1346  BP: 134/50  Pulse: 65  Temp: 36.8 C  Resp: 14    Complications: No apparent anesthesia complications

## 2015-04-02 NOTE — Anesthesia Preprocedure Evaluation (Addendum)
Anesthesia Evaluation  Patient identified by MRN, date of birth, ID band Patient awake    Reviewed: Allergy & Precautions, NPO status , Patient's Chart, lab work & pertinent test results  Airway Mallampati: II  TM Distance: >3 FB Neck ROM: Full    Dental  (+) Teeth Intact   Pulmonary neg pulmonary ROS,    breath sounds clear to auscultation       Cardiovascular hypertension, Pt. on medications and Pt. on home beta blockers  Rhythm:Regular Rate:Normal     Neuro/Psych negative neurological ROS     GI/Hepatic negative GI ROS, Neg liver ROS,   Endo/Other  Morbid obesity  Renal/GU ARF and CRFRenal disease     Musculoskeletal   Abdominal   Peds  Hematology negative hematology ROS (+)   Anesthesia Other Findings   Reproductive/Obstetrics                          Anesthesia Physical Anesthesia Plan  ASA: III  Anesthesia Plan: General   Post-op Pain Management:    Induction: Intravenous  Airway Management Planned: Oral ETT  Additional Equipment:   Intra-op Plan:   Post-operative Plan: Extubation in OR  Informed Consent: I have reviewed the patients History and Physical, chart, labs and discussed the procedure including the risks, benefits and alternatives for the proposed anesthesia with the patient or authorized representative who has indicated his/her understanding and acceptance.   Dental advisory given  Plan Discussed with: CRNA  Anesthesia Plan Comments:         Anesthesia Quick Evaluation

## 2015-04-02 NOTE — Progress Notes (Signed)
Patient ID: Tabitha Black, female   DOB: 26-Sep-1932, 81 y.o.   MRN: [id number]    Subjective: Pt still sore, but feels better.  Objective: Vital signs in last 24 hours: Temp:  [97.8 F (36.6 C)-98.5 F (36.9 C)] 98.5 F (36.9 C) (09/14 0428) Pulse Rate:  [67-75] 67 (09/14 0428) Resp:  [18-19] 18 (09/14 0428) BP: (126-144)/(41-63) 144/54 mmHg (09/14 0428) SpO2:  [98 %-100 %] 98 % (09/14 0428) Last BM Date: 04/01/15  Intake/Output from previous day: 09/13 0701 - 09/14 0700 In: 2798.7 [P.O.:30; I.V.:2768.7] Out: 1650 [Urine:1650] Intake/Output this shift:    PE: Abd: soft, hernia still reduced after I got it reduced yesterday morning.  ND, but obese Heart: regular Lungs: CTAB  Lab Results:   Recent Labs  03/31/15 1715 04/01/15 0501  WBC 6.6 6.9  HGB 14.3 13.0  HCT 42.8 39.4  PLT 376 340   BMET  Recent Labs  04/01/15 1448 04/02/15 0324  NA 136 138  K 3.7 3.6  CL 99* 104  CO2 26 25  GLUCOSE 108* 109*  BUN 68* 62*  CREATININE 4.04* 3.44*  CALCIUM 8.5* 8.6*   PT/INR No results for input(s): LABPROT, INR in the last 72 hours. CMP     Component Value Date/Time   NA 138 04/02/2015 0324   K 3.6 04/02/2015 0324   CL 104 04/02/2015 0324   CO2 25 04/02/2015 0324   GLUCOSE 109* 04/02/2015 0324   BUN 62* 04/02/2015 0324   CREATININE 3.44* 04/02/2015 0324   CALCIUM 8.6* 04/02/2015 0324   PROT 7.2 04/01/2015 0501   ALBUMIN 4.0 04/01/2015 0501   AST 32 04/01/2015 0501   ALT 18 04/01/2015 0501   ALKPHOS 55 04/01/2015 0501   BILITOT 0.9 04/01/2015 0501   GFRNONAA 12* 04/02/2015 0324   GFRAA 13* 04/02/2015 0324   Lipase     Component Value Date/Time   LIPASE 21* 03/31/2015 1715       Studies/Results: Ct Abdomen Pelvis Wo Contrast  03/31/2015   CLINICAL DATA:  81 year old female with lower abdominal AA is caps  EXAM: CT ABDOMEN AND PELVIS WITHOUT CONTRAST  TECHNIQUE: Multidetector CT imaging of the abdomen and pelvis was performed following the standard  protocol without IV contrast.  COMPARISON:  Radiograph dated 03/31/2015 and ultrasound dated 10/11/2013  FINDINGS: Evaluation of this exam is limited in the absence of intravenous contrast.  The visualized lung bases are clear. No intra-abdominal free air or free fluid.  Cholecystectomy. The liver, pancreas, spleen, right adrenal gland appear unremarkable. There is a 12 mm left adrenal adenomas. Mild bilateral renal atrophy. Lobulated appearance of the renal cortices. An exophytic right renal upper pole lesion as well as an ill-defined inferior pole hypodense lesions are not well characterized. Ultrasound is recommended for further evaluation of the kidneys. There is no hydronephrosis or nephrolithiasis on either side. The visualized ureters and urinary bladder appear unremarkable. A small left bladder wall diverticulitis noted. Hysterectomy.  There is a small umbilical hernia containing a short segment of the distal small bowel. Oral contrast is noted within the stomach and multiple loops of proximal and mid small bowel. There is dilatation of the proximal and mid bowel loops. The terminal ileum demonstrates normal caliber. A transition zone is noted at the neck of the umbilical hernia. There is sigmoid diverticulosis without active inflammation. The appendix is not visualized with certainty. No inflammatory changes identified in the right lower quadrant.  There is aortoiliac atherosclerotic disease. There is no lymphadenopathy.  There is a 2 cm peritoneal defect along the lateral pelvic wall with herniation of the pelvic fat. There is extensive degenerative changes of the spine. L3-L4 fixation screws as well as L4-5 disc spacer noted. There is grade 1 L4-5 anterolisthesis. The bones are osteopenic. Scattered lucency throughout the spine most compatible with osteopenia. Metastatic disease is less likely. Simple correlation recommended. No acute fracture.  IMPRESSION: Small-bowel obstruction with transition zone at  the level of the umbilicus secondary to herniation of the distal small bowel into the umbilical hernia. No inflammatory changes or fluid collection noted within the umbilical hernia. Clinical correlation and surgical consult is advised.   Electronically Signed   By: Anner Crete M.D.   On: 03/31/2015 23:39   Dg Chest 2 View  03/31/2015   CLINICAL DATA:  Low abdominal pain with nausea and vomiting for 2 days. History of hypertension.  EXAM: CHEST  2 VIEW  COMPARISON:  01/24/2009.  FINDINGS: The heart size and mediastinal contours are within normal limits. Both lungs are clear. No pleural effusion or pneumothorax. Bony thorax is demineralized but grossly intact  IMPRESSION: No active cardiopulmonary disease.   Electronically Signed   By: Lajean Manes M.D.   On: 03/31/2015 21:41   US Renal  04/01/2015   CLINICAL DATA:  Patient with acute renal failure.  EXAM: RENAL / URINARY TRACT ULTRASOUND COMPLETE  COMPARISON:  CT abdomen pelvis 03/31/2015  FINDINGS: Right Kidney:  Length: 11.3 cm. No hydronephrosis. Renal cortical thinning. Increased renal cortical echogenicity. Multiple hypoechoic lesions are demonstrated within the right renal cortex measuring 1.1 cm and 1.3 cm respectively.  Left Kidney:  Length: 10.3 cm. No hydronephrosis. Renal cortical thinning. Increased renal cortical echogenicity. 1.0 cm hypoechoic lesion within the interpolar region, favored to represent a small cyst.  Bladder:  Decompressed with Foley catheter.  IMPRESSION: No hydronephrosis.  Multiple hypoechoic lesions, predominately within the right kidney, which do not meet criteria for simple cysts. Recommend dedicated evaluation with pre and post contrast-enhanced MRI in the non acute setting.  Increased renal cortical echogenicity as can be seen with chronic medical renal disease.   Electronically Signed   By: Lovey Newcomer M.D.   On: 04/01/2015 19:45   Dg Abd 2 Views  03/31/2015   CLINICAL DATA:  Lower abdominal pain for 2 days with  vomiting and weakness.  EXAM: ABDOMEN - 2 VIEW  COMPARISON:  None.  FINDINGS: Moderately distended gas-filled loops of small bowel are seen within the left upper quadrant, with associated air-fluid levels, suggesting small bowel obstruction versus ileus. Additional scattered nonspecific air-fluid levels are also seen within the pelvis and right lower quadrant.  No evidence of free intraperitoneal air seen. Surgical clips in the right upper quadrant likely related to previous cholecystectomy.  Fixation hardware and disc spacers noted within the lower lumbar spine. Degenerative changes noted throughout the visualized thoracolumbar spine, moderate in degree, with scoliosis. No acute-appearing osseous abnormality.  IMPRESSION: Moderately distended gas-filled loops of small bowel within the left upper quadrant, with associated air-fluid levels, consistent with either a developing small bowel obstruction or ileus. Favor mechanical small bowel obstruction.  Additional nonspecific air-fluid levels scattered within the pelvis and right lower quadrant.  These results were called by telephone at the time of interpretation on 03/31/2015 at 3:24 pm to Dr. Donavan Burnet , who verbally acknowledged these results.   Electronically Signed   By: Franki Cabot M.D.   On: 03/31/2015 15:24    Anti-infectives: Anti-infectives  None       Assessment/Plan  1. Currently reduced umbilical hernia -OR today for lap UHR, +/- mesh depending on infarcted bowel or omentum -cont NPO -abx on call to OR 2. ARF -cr improved to 3.44. -UOP 1650 yesterday -daily labs 3. DVT proph -SCDs   LOS: 1 day    Leydi Winstead E 04/02/2015, 8:45 AM Pager: [phone]

## 2015-04-03 ENCOUNTER — Encounter (HOSPITAL_COMMUNITY): Payer: Self-pay | Admitting: General Surgery

## 2015-04-03 LAB — BASIC METABOLIC PANEL
Anion gap: 8 (ref 5–15)
BUN: 44 mg/dL — ABNORMAL HIGH (ref 6–20)
CO2: 25 mmol/L (ref 22–32)
Calcium: 8.7 mg/dL — ABNORMAL LOW (ref 8.9–10.3)
Chloride: 105 mmol/L (ref 101–111)
Creatinine, Ser: 2.46 mg/dL — ABNORMAL HIGH (ref 0.44–1.00)
GFR calc Af Amer: 20 mL/min — ABNORMAL LOW (ref >60)
GFR calc non Af Amer: 17 mL/min — ABNORMAL LOW (ref >60)
Glucose, Bld: 106 mg/dL — ABNORMAL HIGH (ref 65–99)
Potassium: 3.7 mmol/L (ref 3.5–5.1)
Sodium: 138 mmol/L (ref 135–145)

## 2015-04-03 MED ORDER — AMLODIPINE BESYLATE 5 MG PO TABS
5.0000 mg | ORAL_TABLET | Freq: Every day | ORAL | Status: DC
  Administered 2015-04-03: 5 mg via ORAL
  Filled 2015-04-03: qty 1

## 2015-04-03 NOTE — Progress Notes (Addendum)
Patient ID: Tabitha Black, female   DOB: December 21, 1932, 81 y.o.   MRN: [id number]  [facility] Progress Note   Assessment/ Plan:   1. Acute renal failure on chronic kidney disease stage III-IV: Appears to be hemodynamically mediated ATN-improved well with intravenous fluids and management of her small bowel obstruction. Encouraged oral intake-she is currently off intravenous fluids. No acute electrolyte abnormality or clinical symptoms to prompt intervention. 2. Small bowel obstruction/incarcerated umbilical hernia: She reports some expected post operative discomfort following laparoscopic mesh repair of umbilical hernia. No evidence of further SBO. 3. Hypertension: Blood pressures appear to be fairly controlled at this time on amlodipine/metoprolol 4. Hyponatremia/hypochloremia: Corrected with isotonic intravenous fluids/renal improvement 5. Right kidney hypoechoic lesions: Ultrasound did not find this consistent with simple cysts-would recommend repeating ultrasound in 3-6 months or following up with noncontrast MRI  Subjective:   Reports to be feeling fair other than abdominal discomfort -concerned about "the spots in her kidney"    Objective:   BP 126/37 mmHg  Pulse 66  Temp(Src) 98.2 F (36.8 C) (Oral)  Resp 17  Ht 5\' 3"  (1.6 m)  Wt 99.3 kg (218 lb 14.7 oz)  BMI 38.79 kg/m2  SpO2 96%  Intake/Output Summary (Last 24 hours) at 04/03/15 1003 Last data filed at 04/03/15 [id number]  Gross per 24 hour  Intake    480 ml  Output   1060 ml  Net   -580 ml   Weight change:   Physical Exam: Gen: Comfortably resting in bed CVS: Pulse regular in rate and rhythm, S1 and S2 normal Resp: Decreased breath sounds over bases-poor inspiratory effort Abd: Soft, obese, tender over periumbilical area Ext: No lower extremity edema  Imaging: US Renal  04/01/2015   CLINICAL DATA:  Patient with acute renal failure.  EXAM: RENAL / URINARY TRACT ULTRASOUND COMPLETE  COMPARISON:  CT abdomen  pelvis 03/31/2015  FINDINGS: Right Kidney:  Length: 11.3 cm. No hydronephrosis. Renal cortical thinning. Increased renal cortical echogenicity. Multiple hypoechoic lesions are demonstrated within the right renal cortex measuring 1.1 cm and 1.3 cm respectively.  Left Kidney:  Length: 10.3 cm. No hydronephrosis. Renal cortical thinning. Increased renal cortical echogenicity. 1.0 cm hypoechoic lesion within the interpolar region, favored to represent a small cyst.  Bladder:  Decompressed with Foley catheter.  IMPRESSION: No hydronephrosis.  Multiple hypoechoic lesions, predominately within the right kidney, which do not meet criteria for simple cysts. Recommend dedicated evaluation with pre and post contrast-enhanced MRI in the non acute setting.  Increased renal cortical echogenicity as can be seen with chronic medical renal disease.   Electronically Signed   By: Lovey Newcomer M.D.   On: 04/01/2015 19:45    Labs: BMET  Recent Labs Lab 03/31/15 1715 04/01/15 0501 04/01/15 1448 04/02/15 0324 04/03/15 0402  NA 136 135 136 138 138  K 4.1 4.2 3.7 3.6 3.7  CL 91* 93* 99* 104 105  CO2 25 28 26 25 25   GLUCOSE 146* 150* 108* 109* 106*  BUN 58* 63* 68* 62* 44*  CREATININE 4.04* 4.33* 4.04* 3.44* 2.46*  CALCIUM 10.6* 9.7 8.5* 8.6* 8.7*   CBC  Recent Labs Lab 03/31/15 1715 04/01/15 0501  WBC 6.6 6.9  HGB 14.3 13.0  HCT 42.8 39.4  MCV 88.8 88.9  PLT 376 340   Medications:    . amLODipine  5 mg Oral Daily  . enoxaparin (LOVENOX) injection  40 mg Subcutaneous Q24H  . metoprolol succinate  100 mg Oral QPM  Elmarie Shiley, MD 04/03/2015, 10:03 AM

## 2015-04-03 NOTE — Progress Notes (Signed)
Patient ID: VELVET Tabitha Black, female   DOB: 20-Mar-1933, 81 y.o.   MRN: [id number] 1 Day Post-Op  Subjective: Pt feels ok today, but very sore getting up from bed to the chair.  Had a regular diet last night and tolerated it well.  Passing flatus.  Objective: Vital signs in last 24 hours: Temp:  [97.5 F (36.4 C)-98.3 F (36.8 C)] 98.2 F (36.8 C) (09/15 0624) Pulse Rate:  [61-75] 66 (09/15 0624) Resp:  [10-18] 17 (09/15 0624) BP: (126-147)/(36-54) 126/37 mmHg (09/15 0624) SpO2:  [95 %-100 %] 96 % (09/15 0624) Last BM Date: 03/31/15  Intake/Output from previous day: 09/14 0701 - 09/15 0700 In: 480 [I.V.:480] Out: 1060 [Urine:1050; Blood:10] Intake/Output this shift:    PE: Abd: soft, mild bloating, incisions covered, +BS, appropriately tender Heart: regular Lungs: CTAB  Lab Results:   Recent Labs  03/31/15 1715 04/01/15 0501  WBC 6.6 6.9  HGB 14.3 13.0  HCT 42.8 39.4  PLT 376 340   BMET  Recent Labs  04/02/15 0324 04/03/15 0402  NA 138 138  K 3.6 3.7  CL 104 105  CO2 25 25  GLUCOSE 109* 106*  BUN 62* 44*  CREATININE 3.44* 2.46*  CALCIUM 8.6* 8.7*   PT/INR No results for input(s): LABPROT, INR in the last 72 hours. CMP     Component Value Date/Time   NA 138 04/03/2015 0402   K 3.7 04/03/2015 0402   CL 105 04/03/2015 0402   CO2 25 04/03/2015 0402   GLUCOSE 106* 04/03/2015 0402   BUN 44* 04/03/2015 0402   CREATININE 2.46* 04/03/2015 0402   CALCIUM 8.7* 04/03/2015 0402   PROT 7.2 04/01/2015 0501   ALBUMIN 4.0 04/01/2015 0501   AST 32 04/01/2015 0501   ALT 18 04/01/2015 0501   ALKPHOS 55 04/01/2015 0501   BILITOT 0.9 04/01/2015 0501   GFRNONAA 17* 04/03/2015 0402   GFRAA 20* 04/03/2015 0402   Lipase     Component Value Date/Time   LIPASE 21* 03/31/2015 1715       Studies/Results: US Renal  04/01/2015   CLINICAL DATA:  Patient with acute renal failure.  EXAM: RENAL / URINARY TRACT ULTRASOUND COMPLETE  COMPARISON:  CT abdomen pelvis  03/31/2015  FINDINGS: Right Kidney:  Length: 11.3 cm. No hydronephrosis. Renal cortical thinning. Increased renal cortical echogenicity. Multiple hypoechoic lesions are demonstrated within the right renal cortex measuring 1.1 cm and 1.3 cm respectively.  Left Kidney:  Length: 10.3 cm. No hydronephrosis. Renal cortical thinning. Increased renal cortical echogenicity. 1.0 cm hypoechoic lesion within the interpolar region, favored to represent a small cyst.  Bladder:  Decompressed with Foley catheter.  IMPRESSION: No hydronephrosis.  Multiple hypoechoic lesions, predominately within the right kidney, which do not meet criteria for simple cysts. Recommend dedicated evaluation with pre and post contrast-enhanced MRI in the non acute setting.  Increased renal cortical echogenicity as can be seen with chronic medical renal disease.   Electronically Signed   By: Lovey Newcomer M.D.   On: 04/01/2015 19:45    Anti-infectives: Anti-infectives    Start     Dose/Rate Route Frequency Ordered Stop   04/02/15 1327  [MAR Hold]  clindamycin (CLEOCIN) IVPB 900 mg     (MAR Hold since 04/02/15 1159)   900 mg 100 mL/hr over 30 Minutes Intravenous To [facility] 04/02/15 0904 04/02/15 1307       Assessment/Plan  1. POD 1, s/p lap UHR with mesh -doing well, cont regular diet -mobilize and pulm  toilet 2. ARF -improving, Cr down to 2.47 -will need MRI of kidneys as an outpatient per rec of [place] -DC foley -check Cr in am.  Suspect patient will be stable for dc home by tomorrow.   -will follow nephrology recommendations and appreciate their assistance. 3. DVT prophylaxis -SCDs/Lovenox   LOS: 2 days    Jadiel Schmieder E 04/03/2015, 7:56 AM Pager: [phone]

## 2015-04-04 LAB — BASIC METABOLIC PANEL
Anion gap: 9 (ref 5–15)
BUN: 39 mg/dL — ABNORMAL HIGH (ref 6–20)
CO2: 23 mmol/L (ref 22–32)
Calcium: 8.7 mg/dL — ABNORMAL LOW (ref 8.9–10.3)
Chloride: 104 mmol/L (ref 101–111)
Creatinine, Ser: 2.2 mg/dL — ABNORMAL HIGH (ref 0.44–1.00)
GFR calc Af Amer: 23 mL/min — ABNORMAL LOW (ref >60)
GFR calc non Af Amer: 20 mL/min — ABNORMAL LOW (ref >60)
Glucose, Bld: 109 mg/dL — ABNORMAL HIGH (ref 65–99)
Potassium: 3.3 mmol/L — ABNORMAL LOW (ref 3.5–5.1)
Sodium: 136 mmol/L (ref 135–145)

## 2015-04-04 MED ORDER — HYDROCODONE-ACETAMINOPHEN 5-325 MG PO TABS: 1.0000 | ORAL_TABLET | Freq: Two times a day (BID) | ORAL | Status: DC

## 2015-04-04 NOTE — Progress Notes (Signed)
[name] to be D/C'd Home per MD order.  Discussed with the patient and all questions fully answered.  VSS, Skin clean, dry and intact without evidence of skin break down, no evidence of skin tears noted. IV catheter discontinued intact. Site without signs and symptoms of complications. Dressing and pressure applied.  An After Visit Summary was printed and given to the patient. Patient received prescription.  D/c education completed with patient/family including follow up instructions, medication list, d/c activities limitations if indicated, with other d/c instructions as indicated by MD - patient able to verbalize understanding, all questions fully answered.   Patient instructed to return to ED, call 911, or call MD for any changes in condition.   Patient escorted via [place], and D/C home via private auto.  Tabitha Black 04/04/2015 1100

## 2015-04-04 NOTE — Discharge Summary (Signed)
Patient ID: Tabitha Black MRN: [id number] DOB/AGE: 1932-08-03 81 y.o.  Admit date: 03/31/2015 Discharge date: 04/04/2015  Procedures: laparoscopic umbilical hernia repair with mesh  Consults: nephrology  Reason for Admission: asked to see pt at request of EDP for SBO on CT and incarcerated umbilical hernia. Pt has a two day hx of N/V and abdominal pain. Last BM today. CT shows SBO with transition in umbilical hernia. She has had this for 5 years. Never caused her any problems.   Admission Diagnoses:  1. Incarcerated umbilical hernia 2. ARF  Hospital Course: The patient was admitted.  Given her ARF, surgery was delayed as her creatinine went from 4 to 4.33 despite hydration.  Her hernia was able to be reduced and she was put on bedrest to try and avoid further persistent incarceration.  The following day her creatinine decreased to 3.4 and she was taken to the OR where she underwent lap umbilical hernia repair.  She tolerated this well.  On POD 1, she was tolerating a regular diet and her pain was well controlled.  On POD 2, she was stable for dc home.  2. ARF -nephrology was consulted on HD 1 after her creatinine rose despite fluid resuscitation.  Several labs were ordered and her maxzide was held due to it's diuretic properties.  She was felt to likely have failure/insufficiency secondary to dehydration and some slight ATN due to maxzide use at home prior to admission.  She had a renal US which revealed increased renal cortical echogenicity which may be seen with chronic medical renal disease.  Baseline creatinine in our system from 2010 was 1.5, which goes along with this finding.  She was also noted to have multiple hypoechoic lesions, mostly in the right kidney which did not meet criteria for simple cysts.  Re-evaluation vs contrast-enhanced MRI is recommended.  On POD 2, her creatinine was down to 2.2.  Nephrology felt the patient was stable for dc home with follow up with her PCP next  week with recheck of her BMET.  Her maxzide will be held at discharge as well.  PE: Abd: soft, appropriately tender, +BS, ND, obese, incisions c/d/i  Discharge Diagnoses:  Active Problems:   Small bowel obstruction secondary to incarcerated umbilical hernia ARF, improving S/p laparoscopic umbilical hernia repair   Discharge Medications:   Medication List    STOP taking these medications        triamterene-hydrochlorothiazide 75-50 MG per tablet  Commonly known as:  MAXZIDE      TAKE these medications        amLODipine 5 MG tablet  Commonly known as:  NORVASC  Take 5 mg by mouth daily.     aspirin EC 81 MG tablet  Take 81 mg by mouth every other day.     clindamycin 150 MG capsule  Commonly known as:  CLEOCIN  Take 600 mg by mouth once. FOR DENTIST     CoQ10 100 MG Caps  Take 100 mg by mouth daily.     cyclobenzaprine 10 MG tablet  Commonly known as:  FLEXERIL  Take 5-10 mg by mouth 3 (three) times daily as needed for muscle spasms.     fenofibrate 160 MG tablet  Take 160 mg by mouth every evening.     Fish Oil 1000 MG Caps  Take 1,000 mg by mouth daily.     Flaxseed Oil 1000 MG Caps  Take 1,000 mg by mouth daily.     HYDROcodone-acetaminophen 5-325 MG per tablet  Commonly known as:  NORCO/VICODIN  Take 1-2 tablets by mouth 2 (two) times daily.     metoprolol succinate 100 MG 24 hr tablet  Commonly known as:  TOPROL-XL  Take 100 mg by mouth every evening. Take with or immediately following a meal.     Vitamin D3 2000 UNITS capsule  Take 2,000 Units by mouth daily.        Discharge Instructions:     Follow-up Information    Schedule an appointment as soon as possible for a visit with Reyes Ivan, MD.   Specialty:  General Surgery   Why:  For wound re-check   Contact information:   [address] [place] [address] [phone]       Follow up with Simona Huh, MD On 04/09/2015.   Specialty:  Family Medicine   Why:   check BMET   Contact information:   [address] [place] [address] [phone]       Signed: Henreitta Cea 04/04/2015, 9:41 AM

## 2015-04-04 NOTE — Discharge Instructions (Signed)
Please follow up with Dr. Marisue Humble next Wednesday or at least his office to have your kidney function (BMET) checked   [facility] _______[facility]  UMBILICAL OR INGUINAL HERNIA REPAIR: POST OP INSTRUCTIONS  Always review your discharge instruction sheet given to you by the facility where your surgery was performed. IF YOU HAVE DISABILITY OR FAMILY LEAVE FORMS, YOU MUST BRING THEM TO THE OFFICE FOR PROCESSING.   DO NOT GIVE THEM TO YOUR DOCTOR.  1. A  prescription for pain medication may be given to you upon discharge.  Take your pain medication as prescribed, if needed.  If narcotic pain medicine is not needed, then you may take acetaminophen (Tylenol) or ibuprofen (Advil) as needed. 2. Take your usually prescribed medications unless otherwise directed. 3. If you need a refill on your pain medication, please contact your pharmacy.  They will contact our office to request authorization. Prescriptions will not be filled after 5 pm or on week-ends. 4. You should follow a light diet the first 24 hours after arrival home, such as soup and crackers, etc.  Be sure to include lots of fluids daily.  Resume your normal diet the day after surgery. 5. Most patients will experience some swelling and bruising around the umbilicus or in the groin and scrotum.  Ice packs and reclining will help.  Swelling and bruising can take several days to resolve.  6. It is common to experience some constipation if taking pain medication after surgery.  Increasing fluid intake and taking a stool softener (such as Colace) will usually help or prevent this problem from occurring.  A mild laxative (Milk of Magnesia or Miralax) should be taken according to package directions if there are no bowel movements after 48 hours. 7. Unless discharge instructions indicate otherwise, you may remove your bandages 24-48 hours after surgery, and you may shower at that time.  You may have steri-strips (small skin tapes) in place  directly over the incision.  These strips should be left on the skin for 7-10 days.  If your surgeon used skin glue on the incision, you may shower in 24 hours.  The glue will flake off over the next 2-3 weeks.  Any sutures or staples will be removed at the office during your follow-up visit. 8. ACTIVITIES:  You may resume regular (light) daily activities beginning the next day--such as daily self-care, walking, climbing stairs--gradually increasing activities as tolerated.  You may have sexual intercourse when it is comfortable.  Refrain from any heavy lifting or straining until approved by your doctor. a. You may drive when you are no longer taking prescription pain medication, you can comfortably wear a seatbelt, and you can safely maneuver your car and apply brakes. b. RETURN TO WORK:  __________________________________________________________ 9. You should see your doctor in the office for a follow-up appointment approximately 2-3 weeks after your surgery.  Make sure that you call for this appointment within a day or two after you arrive home to insure a convenient appointment time. 10. OTHER INSTRUCTIONS:  __________________________________________________________________________________________________________________________________________________________________________________________  WHEN TO CALL YOUR DOCTOR: 1. Fever over 101.0 2. Inability to urinate 3. Nausea and/or vomiting 4. Extreme swelling or bruising 5. Continued bleeding from incision. 6. Increased pain, redness, or drainage from the incision  The clinic staff is available to answer your questions during regular business hours.  Please dont hesitate to call and ask to speak to one of the nurses for clinical concerns.  If you have a medical emergency, go to the  nearest emergency room or call 911.  A surgeon from [facility] is always on call at the hospital   [address], [place], [place], [place]   [address] ?  [address], [place], [place]   [address] [phone] ? [phone] ? FAX [phone] Web site: [email or web address]

## 2015-04-10 DIAGNOSIS — I129 Hypertensive chronic kidney disease with stage 1 through stage 4 chronic kidney disease, or unspecified chronic kidney disease: Secondary | ICD-10-CM | POA: Diagnosis not present

## 2015-04-10 DIAGNOSIS — R350 Frequency of micturition: Secondary | ICD-10-CM | POA: Diagnosis not present

## 2015-04-10 DIAGNOSIS — N183 Chronic kidney disease, stage 3 (moderate): Secondary | ICD-10-CM | POA: Diagnosis not present

## 2015-04-10 DIAGNOSIS — R934 Abnormal findings on diagnostic imaging of urinary organs: Secondary | ICD-10-CM | POA: Diagnosis not present

## 2015-04-14 ENCOUNTER — Ambulatory Visit
Admission: RE | Admit: 2015-04-14 | Discharge: 2015-04-14 | Disposition: A | Discharge status: Home / Self Care | Payer: Medicare Other | Source: Ambulatory Visit

## 2015-04-14 DIAGNOSIS — Z1231 Encounter for screening mammogram for malignant neoplasm of breast: Secondary | ICD-10-CM

## 2015-05-05 DIAGNOSIS — N183 Chronic kidney disease, stage 3 (moderate): Secondary | ICD-10-CM | POA: Diagnosis not present

## 2015-06-10 DIAGNOSIS — M47816 Spondylosis without myelopathy or radiculopathy, lumbar region: Secondary | ICD-10-CM | POA: Diagnosis not present

## 2015-06-10 DIAGNOSIS — M4722 Other spondylosis with radiculopathy, cervical region: Secondary | ICD-10-CM | POA: Diagnosis not present

## 2015-09-15 DIAGNOSIS — E782 Mixed hyperlipidemia: Secondary | ICD-10-CM | POA: Diagnosis not present

## 2015-09-15 DIAGNOSIS — R9341 Abnormal radiologic findings on diagnostic imaging of renal pelvis, ureter, or bladder: Secondary | ICD-10-CM | POA: Diagnosis not present

## 2015-09-15 DIAGNOSIS — I129 Hypertensive chronic kidney disease with stage 1 through stage 4 chronic kidney disease, or unspecified chronic kidney disease: Secondary | ICD-10-CM | POA: Diagnosis not present

## 2015-09-15 DIAGNOSIS — Z1389 Encounter for screening for other disorder: Secondary | ICD-10-CM | POA: Diagnosis not present

## 2015-09-15 DIAGNOSIS — R110 Nausea: Secondary | ICD-10-CM | POA: Diagnosis not present

## 2015-09-15 DIAGNOSIS — N184 Chronic kidney disease, stage 4 (severe): Secondary | ICD-10-CM | POA: Diagnosis not present

## 2015-09-15 DIAGNOSIS — R7309 Other abnormal glucose: Secondary | ICD-10-CM | POA: Diagnosis not present

## 2015-09-15 DIAGNOSIS — D492 Neoplasm of unspecified behavior of bone, soft tissue, and skin: Secondary | ICD-10-CM | POA: Diagnosis not present

## 2015-09-15 DIAGNOSIS — Z0000 Encounter for general adult medical examination without abnormal findings: Secondary | ICD-10-CM | POA: Diagnosis not present

## 2015-09-24 DIAGNOSIS — L720 Epidermal cyst: Secondary | ICD-10-CM | POA: Diagnosis not present

## 2015-09-24 DIAGNOSIS — Z85828 Personal history of other malignant neoplasm of skin: Secondary | ICD-10-CM | POA: Diagnosis not present

## 2015-09-24 DIAGNOSIS — C4441 Basal cell carcinoma of skin of scalp and neck: Secondary | ICD-10-CM | POA: Diagnosis not present

## 2015-10-01 DIAGNOSIS — C4441 Basal cell carcinoma of skin of scalp and neck: Secondary | ICD-10-CM | POA: Diagnosis not present

## 2015-12-09 DIAGNOSIS — M549 Dorsalgia, unspecified: Secondary | ICD-10-CM | POA: Diagnosis not present

## 2015-12-09 DIAGNOSIS — M47816 Spondylosis without myelopathy or radiculopathy, lumbar region: Secondary | ICD-10-CM | POA: Diagnosis not present

## 2015-12-11 ENCOUNTER — Other Ambulatory Visit: Payer: Self-pay | Admitting: Nurse Practitioner

## 2015-12-11 ENCOUNTER — Ambulatory Visit
Admission: RE | Admit: 2015-12-11 | Discharge: 2015-12-11 | Disposition: A | Discharge status: Home / Self Care | Payer: Medicare Other | Source: Ambulatory Visit | Attending: Nurse Practitioner | Admitting: Nurse Practitioner

## 2015-12-11 DIAGNOSIS — M47816 Spondylosis without myelopathy or radiculopathy, lumbar region: Secondary | ICD-10-CM

## 2015-12-11 DIAGNOSIS — M4326 Fusion of spine, lumbar region: Secondary | ICD-10-CM | POA: Diagnosis not present

## 2015-12-17 DIAGNOSIS — M6281 Muscle weakness (generalized): Secondary | ICD-10-CM | POA: Diagnosis not present

## 2015-12-17 DIAGNOSIS — R293 Abnormal posture: Secondary | ICD-10-CM | POA: Diagnosis not present

## 2015-12-17 DIAGNOSIS — R262 Difficulty in walking, not elsewhere classified: Secondary | ICD-10-CM | POA: Diagnosis not present

## 2015-12-17 DIAGNOSIS — M545 Low back pain: Secondary | ICD-10-CM | POA: Diagnosis not present

## 2015-12-19 DIAGNOSIS — R293 Abnormal posture: Secondary | ICD-10-CM | POA: Diagnosis not present

## 2015-12-19 DIAGNOSIS — R262 Difficulty in walking, not elsewhere classified: Secondary | ICD-10-CM | POA: Diagnosis not present

## 2015-12-19 DIAGNOSIS — M545 Low back pain: Secondary | ICD-10-CM | POA: Diagnosis not present

## 2015-12-19 DIAGNOSIS — M6281 Muscle weakness (generalized): Secondary | ICD-10-CM | POA: Diagnosis not present

## 2015-12-24 DIAGNOSIS — M545 Low back pain: Secondary | ICD-10-CM | POA: Diagnosis not present

## 2015-12-24 DIAGNOSIS — R262 Difficulty in walking, not elsewhere classified: Secondary | ICD-10-CM | POA: Diagnosis not present

## 2015-12-24 DIAGNOSIS — M6281 Muscle weakness (generalized): Secondary | ICD-10-CM | POA: Diagnosis not present

## 2015-12-24 DIAGNOSIS — R293 Abnormal posture: Secondary | ICD-10-CM | POA: Diagnosis not present

## 2015-12-26 DIAGNOSIS — M545 Low back pain: Secondary | ICD-10-CM | POA: Diagnosis not present

## 2015-12-26 DIAGNOSIS — M6281 Muscle weakness (generalized): Secondary | ICD-10-CM | POA: Diagnosis not present

## 2015-12-26 DIAGNOSIS — R293 Abnormal posture: Secondary | ICD-10-CM | POA: Diagnosis not present

## 2015-12-26 DIAGNOSIS — R262 Difficulty in walking, not elsewhere classified: Secondary | ICD-10-CM | POA: Diagnosis not present

## 2015-12-29 DIAGNOSIS — R293 Abnormal posture: Secondary | ICD-10-CM | POA: Diagnosis not present

## 2015-12-29 DIAGNOSIS — M545 Low back pain: Secondary | ICD-10-CM | POA: Diagnosis not present

## 2015-12-29 DIAGNOSIS — R262 Difficulty in walking, not elsewhere classified: Secondary | ICD-10-CM | POA: Diagnosis not present

## 2015-12-29 DIAGNOSIS — M6281 Muscle weakness (generalized): Secondary | ICD-10-CM | POA: Diagnosis not present

## 2015-12-31 DIAGNOSIS — R262 Difficulty in walking, not elsewhere classified: Secondary | ICD-10-CM | POA: Diagnosis not present

## 2015-12-31 DIAGNOSIS — M6281 Muscle weakness (generalized): Secondary | ICD-10-CM | POA: Diagnosis not present

## 2015-12-31 DIAGNOSIS — M545 Low back pain: Secondary | ICD-10-CM | POA: Diagnosis not present

## 2015-12-31 DIAGNOSIS — R293 Abnormal posture: Secondary | ICD-10-CM | POA: Diagnosis not present

## 2016-01-05 DIAGNOSIS — M545 Low back pain: Secondary | ICD-10-CM | POA: Diagnosis not present

## 2016-01-05 DIAGNOSIS — R293 Abnormal posture: Secondary | ICD-10-CM | POA: Diagnosis not present

## 2016-01-05 DIAGNOSIS — M6281 Muscle weakness (generalized): Secondary | ICD-10-CM | POA: Diagnosis not present

## 2016-01-05 DIAGNOSIS — R262 Difficulty in walking, not elsewhere classified: Secondary | ICD-10-CM | POA: Diagnosis not present

## 2016-01-09 DIAGNOSIS — R293 Abnormal posture: Secondary | ICD-10-CM | POA: Diagnosis not present

## 2016-01-09 DIAGNOSIS — M6281 Muscle weakness (generalized): Secondary | ICD-10-CM | POA: Diagnosis not present

## 2016-01-09 DIAGNOSIS — R262 Difficulty in walking, not elsewhere classified: Secondary | ICD-10-CM | POA: Diagnosis not present

## 2016-01-09 DIAGNOSIS — M545 Low back pain: Secondary | ICD-10-CM | POA: Diagnosis not present

## 2016-01-12 DIAGNOSIS — M6281 Muscle weakness (generalized): Secondary | ICD-10-CM | POA: Diagnosis not present

## 2016-01-12 DIAGNOSIS — R293 Abnormal posture: Secondary | ICD-10-CM | POA: Diagnosis not present

## 2016-01-12 DIAGNOSIS — R262 Difficulty in walking, not elsewhere classified: Secondary | ICD-10-CM | POA: Diagnosis not present

## 2016-01-12 DIAGNOSIS — M545 Low back pain: Secondary | ICD-10-CM | POA: Diagnosis not present

## 2016-01-15 DIAGNOSIS — R262 Difficulty in walking, not elsewhere classified: Secondary | ICD-10-CM | POA: Diagnosis not present

## 2016-01-15 DIAGNOSIS — M6281 Muscle weakness (generalized): Secondary | ICD-10-CM | POA: Diagnosis not present

## 2016-01-15 DIAGNOSIS — R293 Abnormal posture: Secondary | ICD-10-CM | POA: Diagnosis not present

## 2016-01-15 DIAGNOSIS — M545 Low back pain: Secondary | ICD-10-CM | POA: Diagnosis not present

## 2016-01-19 DIAGNOSIS — R293 Abnormal posture: Secondary | ICD-10-CM | POA: Diagnosis not present

## 2016-01-19 DIAGNOSIS — M6281 Muscle weakness (generalized): Secondary | ICD-10-CM | POA: Diagnosis not present

## 2016-01-19 DIAGNOSIS — R262 Difficulty in walking, not elsewhere classified: Secondary | ICD-10-CM | POA: Diagnosis not present

## 2016-01-19 DIAGNOSIS — M545 Low back pain: Secondary | ICD-10-CM | POA: Diagnosis not present

## 2016-01-21 DIAGNOSIS — M47816 Spondylosis without myelopathy or radiculopathy, lumbar region: Secondary | ICD-10-CM | POA: Diagnosis not present

## 2016-01-21 DIAGNOSIS — M549 Dorsalgia, unspecified: Secondary | ICD-10-CM | POA: Diagnosis not present

## 2016-01-21 DIAGNOSIS — M4722 Other spondylosis with radiculopathy, cervical region: Secondary | ICD-10-CM | POA: Diagnosis not present

## 2016-01-23 DIAGNOSIS — R262 Difficulty in walking, not elsewhere classified: Secondary | ICD-10-CM | POA: Diagnosis not present

## 2016-01-23 DIAGNOSIS — M6281 Muscle weakness (generalized): Secondary | ICD-10-CM | POA: Diagnosis not present

## 2016-01-23 DIAGNOSIS — M545 Low back pain: Secondary | ICD-10-CM | POA: Diagnosis not present

## 2016-01-23 DIAGNOSIS — R293 Abnormal posture: Secondary | ICD-10-CM | POA: Diagnosis not present

## 2016-01-26 DIAGNOSIS — R293 Abnormal posture: Secondary | ICD-10-CM | POA: Diagnosis not present

## 2016-01-26 DIAGNOSIS — M545 Low back pain: Secondary | ICD-10-CM | POA: Diagnosis not present

## 2016-01-26 DIAGNOSIS — R262 Difficulty in walking, not elsewhere classified: Secondary | ICD-10-CM | POA: Diagnosis not present

## 2016-01-26 DIAGNOSIS — M6281 Muscle weakness (generalized): Secondary | ICD-10-CM | POA: Diagnosis not present

## 2016-01-29 DIAGNOSIS — M545 Low back pain: Secondary | ICD-10-CM | POA: Diagnosis not present

## 2016-01-29 DIAGNOSIS — R293 Abnormal posture: Secondary | ICD-10-CM | POA: Diagnosis not present

## 2016-01-29 DIAGNOSIS — M6281 Muscle weakness (generalized): Secondary | ICD-10-CM | POA: Diagnosis not present

## 2016-01-29 DIAGNOSIS — R262 Difficulty in walking, not elsewhere classified: Secondary | ICD-10-CM | POA: Diagnosis not present

## 2016-02-02 DIAGNOSIS — M545 Low back pain: Secondary | ICD-10-CM | POA: Diagnosis not present

## 2016-02-02 DIAGNOSIS — M6281 Muscle weakness (generalized): Secondary | ICD-10-CM | POA: Diagnosis not present

## 2016-02-02 DIAGNOSIS — R293 Abnormal posture: Secondary | ICD-10-CM | POA: Diagnosis not present

## 2016-02-02 DIAGNOSIS — R262 Difficulty in walking, not elsewhere classified: Secondary | ICD-10-CM | POA: Diagnosis not present

## 2016-02-06 DIAGNOSIS — R293 Abnormal posture: Secondary | ICD-10-CM | POA: Diagnosis not present

## 2016-02-06 DIAGNOSIS — M6281 Muscle weakness (generalized): Secondary | ICD-10-CM | POA: Diagnosis not present

## 2016-02-06 DIAGNOSIS — R262 Difficulty in walking, not elsewhere classified: Secondary | ICD-10-CM | POA: Diagnosis not present

## 2016-02-06 DIAGNOSIS — T148 Other injury of unspecified body region: Secondary | ICD-10-CM | POA: Diagnosis not present

## 2016-02-06 DIAGNOSIS — L0390 Cellulitis, unspecified: Secondary | ICD-10-CM | POA: Diagnosis not present

## 2016-02-06 DIAGNOSIS — M545 Low back pain: Secondary | ICD-10-CM | POA: Diagnosis not present

## 2016-02-09 DIAGNOSIS — R293 Abnormal posture: Secondary | ICD-10-CM | POA: Diagnosis not present

## 2016-02-09 DIAGNOSIS — M6281 Muscle weakness (generalized): Secondary | ICD-10-CM | POA: Diagnosis not present

## 2016-02-09 DIAGNOSIS — M545 Low back pain: Secondary | ICD-10-CM | POA: Diagnosis not present

## 2016-02-09 DIAGNOSIS — R262 Difficulty in walking, not elsewhere classified: Secondary | ICD-10-CM | POA: Diagnosis not present

## 2016-02-16 DIAGNOSIS — R293 Abnormal posture: Secondary | ICD-10-CM | POA: Diagnosis not present

## 2016-02-16 DIAGNOSIS — R262 Difficulty in walking, not elsewhere classified: Secondary | ICD-10-CM | POA: Diagnosis not present

## 2016-02-16 DIAGNOSIS — M6281 Muscle weakness (generalized): Secondary | ICD-10-CM | POA: Diagnosis not present

## 2016-02-16 DIAGNOSIS — M545 Low back pain: Secondary | ICD-10-CM | POA: Diagnosis not present

## 2016-02-18 DIAGNOSIS — M6281 Muscle weakness (generalized): Secondary | ICD-10-CM | POA: Diagnosis not present

## 2016-02-18 DIAGNOSIS — R262 Difficulty in walking, not elsewhere classified: Secondary | ICD-10-CM | POA: Diagnosis not present

## 2016-02-18 DIAGNOSIS — R293 Abnormal posture: Secondary | ICD-10-CM | POA: Diagnosis not present

## 2016-02-18 DIAGNOSIS — M545 Low back pain: Secondary | ICD-10-CM | POA: Diagnosis not present

## 2016-02-23 DIAGNOSIS — R293 Abnormal posture: Secondary | ICD-10-CM | POA: Diagnosis not present

## 2016-02-23 DIAGNOSIS — M6281 Muscle weakness (generalized): Secondary | ICD-10-CM | POA: Diagnosis not present

## 2016-02-23 DIAGNOSIS — R262 Difficulty in walking, not elsewhere classified: Secondary | ICD-10-CM | POA: Diagnosis not present

## 2016-02-23 DIAGNOSIS — M545 Low back pain: Secondary | ICD-10-CM | POA: Diagnosis not present

## 2016-02-27 DIAGNOSIS — R293 Abnormal posture: Secondary | ICD-10-CM | POA: Diagnosis not present

## 2016-02-27 DIAGNOSIS — R262 Difficulty in walking, not elsewhere classified: Secondary | ICD-10-CM | POA: Diagnosis not present

## 2016-02-27 DIAGNOSIS — M6281 Muscle weakness (generalized): Secondary | ICD-10-CM | POA: Diagnosis not present

## 2016-02-27 DIAGNOSIS — M545 Low back pain: Secondary | ICD-10-CM | POA: Diagnosis not present

## 2016-03-01 DIAGNOSIS — M545 Low back pain: Secondary | ICD-10-CM | POA: Diagnosis not present

## 2016-03-01 DIAGNOSIS — R293 Abnormal posture: Secondary | ICD-10-CM | POA: Diagnosis not present

## 2016-03-01 DIAGNOSIS — R262 Difficulty in walking, not elsewhere classified: Secondary | ICD-10-CM | POA: Diagnosis not present

## 2016-03-01 DIAGNOSIS — M6281 Muscle weakness (generalized): Secondary | ICD-10-CM | POA: Diagnosis not present

## 2016-03-05 DIAGNOSIS — R293 Abnormal posture: Secondary | ICD-10-CM | POA: Diagnosis not present

## 2016-03-05 DIAGNOSIS — M6281 Muscle weakness (generalized): Secondary | ICD-10-CM | POA: Diagnosis not present

## 2016-03-05 DIAGNOSIS — R262 Difficulty in walking, not elsewhere classified: Secondary | ICD-10-CM | POA: Diagnosis not present

## 2016-03-05 DIAGNOSIS — M545 Low back pain: Secondary | ICD-10-CM | POA: Diagnosis not present

## 2016-03-15 ENCOUNTER — Other Ambulatory Visit: Payer: Self-pay | Admitting: Family Medicine

## 2016-03-15 DIAGNOSIS — Z1231 Encounter for screening mammogram for malignant neoplasm of breast: Secondary | ICD-10-CM

## 2016-03-16 DIAGNOSIS — E669 Obesity, unspecified: Secondary | ICD-10-CM | POA: Diagnosis not present

## 2016-03-16 DIAGNOSIS — R7309 Other abnormal glucose: Secondary | ICD-10-CM | POA: Diagnosis not present

## 2016-03-16 DIAGNOSIS — E782 Mixed hyperlipidemia: Secondary | ICD-10-CM | POA: Diagnosis not present

## 2016-03-16 DIAGNOSIS — D692 Other nonthrombocytopenic purpura: Secondary | ICD-10-CM | POA: Diagnosis not present

## 2016-03-16 DIAGNOSIS — I129 Hypertensive chronic kidney disease with stage 1 through stage 4 chronic kidney disease, or unspecified chronic kidney disease: Secondary | ICD-10-CM | POA: Diagnosis not present

## 2016-03-16 DIAGNOSIS — N184 Chronic kidney disease, stage 4 (severe): Secondary | ICD-10-CM | POA: Diagnosis not present

## 2016-03-29 DIAGNOSIS — E119 Type 2 diabetes mellitus without complications: Secondary | ICD-10-CM | POA: Diagnosis not present

## 2016-03-29 DIAGNOSIS — H25013 Cortical age-related cataract, bilateral: Secondary | ICD-10-CM | POA: Diagnosis not present

## 2016-03-29 DIAGNOSIS — H2513 Age-related nuclear cataract, bilateral: Secondary | ICD-10-CM | POA: Diagnosis not present

## 2016-03-29 DIAGNOSIS — Z0100 Encounter for examination of eyes and vision without abnormal findings: Secondary | ICD-10-CM | POA: Diagnosis not present

## 2016-03-31 DIAGNOSIS — I129 Hypertensive chronic kidney disease with stage 1 through stage 4 chronic kidney disease, or unspecified chronic kidney disease: Secondary | ICD-10-CM | POA: Diagnosis not present

## 2016-04-14 ENCOUNTER — Ambulatory Visit
Admission: RE | Admit: 2016-04-14 | Discharge: 2016-04-14 | Disposition: A | Discharge status: Home / Self Care | Payer: Medicare Other | Source: Ambulatory Visit | Attending: Family Medicine | Admitting: Family Medicine

## 2016-04-14 DIAGNOSIS — Z1231 Encounter for screening mammogram for malignant neoplasm of breast: Secondary | ICD-10-CM

## 2016-04-29 DIAGNOSIS — I129 Hypertensive chronic kidney disease with stage 1 through stage 4 chronic kidney disease, or unspecified chronic kidney disease: Secondary | ICD-10-CM | POA: Diagnosis not present

## 2016-05-11 DIAGNOSIS — M47816 Spondylosis without myelopathy or radiculopathy, lumbar region: Secondary | ICD-10-CM | POA: Diagnosis not present

## 2016-05-12 ENCOUNTER — Other Ambulatory Visit: Payer: Self-pay | Admitting: Nurse Practitioner

## 2016-05-12 DIAGNOSIS — M47816 Spondylosis without myelopathy or radiculopathy, lumbar region: Secondary | ICD-10-CM

## 2016-05-25 ENCOUNTER — Ambulatory Visit
Admission: RE | Admit: 2016-05-25 | Discharge: 2016-05-25 | Disposition: A | Discharge status: Home / Self Care | Payer: Medicare Other | Source: Ambulatory Visit | Attending: Nurse Practitioner | Admitting: Nurse Practitioner

## 2016-05-25 DIAGNOSIS — M47816 Spondylosis without myelopathy or radiculopathy, lumbar region: Secondary | ICD-10-CM

## 2016-05-25 DIAGNOSIS — M4326 Fusion of spine, lumbar region: Secondary | ICD-10-CM | POA: Diagnosis not present

## 2016-06-02 DIAGNOSIS — I129 Hypertensive chronic kidney disease with stage 1 through stage 4 chronic kidney disease, or unspecified chronic kidney disease: Secondary | ICD-10-CM | POA: Diagnosis not present

## 2016-06-02 DIAGNOSIS — M1990 Unspecified osteoarthritis, unspecified site: Secondary | ICD-10-CM | POA: Diagnosis not present

## 2016-06-04 DIAGNOSIS — M47816 Spondylosis without myelopathy or radiculopathy, lumbar region: Secondary | ICD-10-CM | POA: Diagnosis not present

## 2016-06-07 ENCOUNTER — Other Ambulatory Visit: Payer: Self-pay | Admitting: Neurosurgery

## 2016-06-07 DIAGNOSIS — M47816 Spondylosis without myelopathy or radiculopathy, lumbar region: Secondary | ICD-10-CM

## 2016-06-18 ENCOUNTER — Ambulatory Visit
Admission: RE | Admit: 2016-06-18 | Discharge: 2016-06-18 | Disposition: A | Discharge status: Home / Self Care | Payer: Medicare Other | Source: Ambulatory Visit | Attending: Neurosurgery | Admitting: Neurosurgery

## 2016-06-18 DIAGNOSIS — M545 Low back pain: Secondary | ICD-10-CM | POA: Diagnosis not present

## 2016-06-18 DIAGNOSIS — M47816 Spondylosis without myelopathy or radiculopathy, lumbar region: Secondary | ICD-10-CM

## 2016-06-18 MED ORDER — DIAZEPAM 5 MG PO TABS
5.0000 mg | ORAL_TABLET | Freq: Once | ORAL | Status: AC
  Administered 2016-06-18: 5 mg via ORAL

## 2016-06-18 MED ORDER — IOPAMIDOL (ISOVUE-M 200) INJECTION 41%
1.0000 mL | Freq: Once | INTRAMUSCULAR | Status: AC
  Administered 2016-06-18: 1 mL via INTRA_ARTICULAR

## 2016-06-18 MED ORDER — METHYLPREDNISOLONE ACETATE 40 MG/ML INJ SUSP (RADIOLOG
120.0000 mg | Freq: Once | INTRAMUSCULAR | Status: AC
  Administered 2016-06-18: 120 mg via INTRA_ARTICULAR

## 2016-06-18 NOTE — Discharge Instructions (Signed)

## 2016-07-16 DIAGNOSIS — M47816 Spondylosis without myelopathy or radiculopathy, lumbar region: Secondary | ICD-10-CM | POA: Diagnosis not present

## 2016-11-04 ENCOUNTER — Telehealth (HOSPITAL_COMMUNITY): Payer: Self-pay | Admitting: Family Medicine

## 2016-11-04 ENCOUNTER — Other Ambulatory Visit: Payer: Self-pay | Admitting: Family Medicine

## 2016-11-04 DIAGNOSIS — Z0000 Encounter for general adult medical examination without abnormal findings: Secondary | ICD-10-CM | POA: Diagnosis not present

## 2016-11-04 DIAGNOSIS — E782 Mixed hyperlipidemia: Secondary | ICD-10-CM | POA: Diagnosis not present

## 2016-11-04 DIAGNOSIS — D692 Other nonthrombocytopenic purpura: Secondary | ICD-10-CM | POA: Diagnosis not present

## 2016-11-04 DIAGNOSIS — R0609 Other forms of dyspnea: Principal | ICD-10-CM

## 2016-11-04 DIAGNOSIS — R7309 Other abnormal glucose: Secondary | ICD-10-CM | POA: Diagnosis not present

## 2016-11-04 DIAGNOSIS — Z1389 Encounter for screening for other disorder: Secondary | ICD-10-CM | POA: Diagnosis not present

## 2016-11-04 DIAGNOSIS — R93429 Abnormal radiologic findings on diagnostic imaging of unspecified kidney: Secondary | ICD-10-CM | POA: Diagnosis not present

## 2016-11-04 DIAGNOSIS — N184 Chronic kidney disease, stage 4 (severe): Secondary | ICD-10-CM | POA: Diagnosis not present

## 2016-11-04 DIAGNOSIS — R609 Edema, unspecified: Secondary | ICD-10-CM | POA: Diagnosis not present

## 2016-11-04 DIAGNOSIS — R0600 Dyspnea, unspecified: Secondary | ICD-10-CM

## 2016-11-04 DIAGNOSIS — I129 Hypertensive chronic kidney disease with stage 1 through stage 4 chronic kidney disease, or unspecified chronic kidney disease: Secondary | ICD-10-CM | POA: Diagnosis not present

## 2016-11-05 NOTE — Telephone Encounter (Signed)
  11/04/2016 01:55 PM Phone (Outgoing) Tabitha Black, Tabitha Black (Self) [phone] (H)   Left Message - Called pt and lmsg for her to CB to get scheduled for echo that doctor wanted her to have.     By Verdene Rio

## 2016-11-18 ENCOUNTER — Other Ambulatory Visit: Payer: Self-pay

## 2016-11-18 ENCOUNTER — Ambulatory Visit (HOSPITAL_COMMUNITY): Payer: Medicare Other | Attending: Cardiology

## 2016-11-18 DIAGNOSIS — R0609 Other forms of dyspnea: Secondary | ICD-10-CM | POA: Diagnosis not present

## 2016-11-18 DIAGNOSIS — I422 Other hypertrophic cardiomyopathy: Secondary | ICD-10-CM | POA: Diagnosis not present

## 2016-11-18 DIAGNOSIS — I340 Nonrheumatic mitral (valve) insufficiency: Secondary | ICD-10-CM | POA: Insufficient documentation

## 2016-11-18 DIAGNOSIS — R0600 Dyspnea, unspecified: Secondary | ICD-10-CM

## 2016-11-18 DIAGNOSIS — I5030 Unspecified diastolic (congestive) heart failure: Secondary | ICD-10-CM | POA: Diagnosis not present

## 2016-11-18 DIAGNOSIS — I420 Dilated cardiomyopathy: Secondary | ICD-10-CM | POA: Diagnosis not present

## 2016-11-18 DIAGNOSIS — I361 Nonrheumatic tricuspid (valve) insufficiency: Secondary | ICD-10-CM | POA: Diagnosis not present

## 2016-11-18 DIAGNOSIS — I272 Pulmonary hypertension, unspecified: Secondary | ICD-10-CM | POA: Diagnosis not present

## 2016-11-18 MED ORDER — PERFLUTREN LIPID MICROSPHERE
1.0000 mL | INTRAVENOUS | Status: AC | PRN
  Administered 2016-11-18: 2 mL via INTRAVENOUS

## 2016-11-29 NOTE — Progress Notes (Addendum)
Cardiology Office Note    Date:  11/30/2016   ID:  Tabitha Black, DOB 08/21/1932, MRN [id number]  PCP:  [name], MD  Cardiologist:  New- scheduled to see Dr. Johnsie Cancel in 02/2017  CC: dyspnea and abnormal ECHO.   History of Present Illness:  Tabitha Black is a 83 y.o. 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   Recently seen by PCP, Dr. Marisue Black, for general follow up and worsening DOE. Labs 11/04/16: creat 1.91, GFR 25, TC 191, TG 266, HDL 43, LDL 94, Hga1c 6.1, K 5.1. I reviewed his last note which reported a long history of LE edema that pre dated amlodipine use. She has been on HCTZ 50mg  daily for ~ 20 years. She has hyperkalemia with both ACE and ARB and worsening kidney function to around 2 and therefore discontinued. She has had difficult to control HTN and has significant white coat HTN. Given shortness of breath and worsening LE edema, an echocardiogram was ordered. This showed normal LV function with asymmetric hypertrophy of the LV apex (spade-shaped ventricle) suggestive of apical hypertrophic cardiomyopathy as well as G2DD, no WMAs, mod LAE, and mild pulm HTN with PA pressure 45.   Today she presents to clinic for new patient evaluation. She reports that over the past year she has been noticing worsening DOE. Sometimes its worse than others. She sometimes gets SOB when walking to mailbox (~250 feet). No chest pain. She has chronic LE edema that has become worse recently. She had what she thinks was as bug bite last summer and since that time that leg has been much more swollen. She wears compression stockings daily. No abdominal distension. He has gained about 6 lbs recently, but thinks that due to poor eating habits and minimal activity.  No recent car trips or airplane rides. No orthopnea or PND. No dizziness or  syncope. No blood in stool or urine. House work is the most exertion she gets. She does hang all of her laundry. No chest pain with exertion.   She checks her BP at home at it runs 150-160 / 60-70s. No strong family history of CAD.    Past Medical History:  Diagnosis Date  . CKD (chronic kidney disease)   . Diabetes mellitus without complication ([place])    type II  . GERD (gastroesophageal reflux disease)   . High cholesterol   . Hypertension   . Osteoarthritis   . Skin cancer, basal cell     Past Surgical History:  Procedure Laterality Date  . ABDOMINAL HYSTERECTOMY N/A   . APPENDECTOMY N/A 1953  . BACK SURGERY    . CHOLECYSTECTOMY    . COLONOSCOPY N/A 02/22/2008  . TOTAL KNEE ARTHROPLASTY Right   . UMBILICAL HERNIA REPAIR N/A 04/02/2015   Procedure: LAPAROSCOPIC UMBILICAL HERNIA REPAIR WITH VENTRALIGHT MESH;  Surgeon: Ralene Ok, MD;  Location: [facility];  Service: General;  Laterality: N/A;    Current Medications: Outpatient Medications Prior to Visit  Medication Sig Dispense Refill  . aspirin EC 81 MG tablet Take 81 mg by mouth every other day.     . Cholecalciferol (VITAMIN D3) 2000 UNITS capsule Take 2,000 Units by mouth daily.    . cyclobenzaprine (FLEXERIL) 10 MG tablet Take 5-10 mg by mouth 3 (three) times daily as needed for muscle spasms.    Marland Kitchen  fenofibrate 160 MG tablet Take 160 mg by mouth every evening.     Marland Kitchen HYDROcodone-acetaminophen (NORCO/VICODIN) 5-325 MG per tablet Take 1-2 tablets by mouth 2 (two) times daily. 40 tablet 0  . metoprolol succinate (TOPROL-XL) 100 MG 24 hr tablet Take 100 mg by mouth every evening. Take with or immediately following a meal.    . amLODipine (NORVASC) 5 MG tablet Take 5 mg by mouth daily.    . clindamycin (CLEOCIN) 150 MG capsule Take 600 mg by mouth once. FOR DENTIST    . Coenzyme Q10 (COQ10) 100 MG CAPS Take 100 mg by mouth daily.    . Flaxseed, Linseed, (FLAXSEED OIL) 1000 MG CAPS Take 1,000 mg by mouth daily.    . Omega-3  Fatty Acids (FISH OIL) 1000 MG CAPS Take 1,000 mg by mouth daily.     No facility-administered medications prior to visit.      Allergies:   Other; Penicillins; Lovastatin; Lisinopril; Neomycin sulfate [neomycin]; Niaspan [niacin]; Valsartan; Vytorin [ezetimibe-simvastatin]; and Zetia [ezetimibe]   Social History   Social History  . Marital status: Married    Spouse name: N/A  . Number of children: N/A  . Years of education: N/A   Social History Main Topics  . Smoking status: Never Smoker  . Smokeless tobacco: Never Used  . Alcohol use Yes     Comment: twice a year  . Drug use: No  . Sexual activity: Not Asked   Other Topics Concern  . None   Social History Narrative  . None     Family History:  The patient's family history includes Breast cancer in her sister; Heart attack in her father; Heart failure in her mother; Hypertension in her mother; Prostate cancer in her father.      ROS:   Please see the history of present illness.    ROS All other systems reviewed and are negative.   PHYSICAL EXAM:   VS:  BP (!) 180/70 (BP Location: Left Arm, Patient Position: Sitting, Cuff Size: Large)   Pulse 83   Ht 5\' 3"  (1.6 m)   Wt 239 lb 12.8 oz (108.8 kg)   SpO2 97%   BMI 42.48 kg/m    GEN: Well nourished, well developed, in no acute distress, morbidly obese . HEENT: normal  Neck: JVD difficult to assess given body habitus, carotid bruits, or masses Cardiac: RRR; no murmurs, rubs, or gallops, 2+ LE edema, R>L Respiratory:  clear to auscultation bilaterally, normal work of breathing GI: soft, nontender, nondistended, + BS MS: no deformity or atrophy  Skin: warm and dry, no rash Neuro:  Alert and Oriented x 3, Strength and sensation are intact Psych: euthymic mood, full affect    Wt Readings from Last 3 Encounters:  11/30/16 239 lb 12.8 oz (108.8 kg)  04/01/15 218 lb 14.7 oz (99.3 kg)      Studies/Labs Reviewed:   EKG:  EKG is ordered today.  The ekg ordered  today demonstrates sinus HR 83 with TWI in V3-V6 which could be repolarization abnormalities. No previous ECG to compare to  Recent Labs: No results found for requested labs within last 8760 hours.   Lipid Panel No results found for: CHOL, TRIG, HDL, CHOLHDL, VLDL, LDLCALC, LDLDIRECT  Additional studies/ records that were reviewed today include:  2D ECHO: 11/18/2016 LV EF: 60% -   65% Study Conclusions - Left ventricle: The cavity size was normal. There appeared to   asymmetric hypertrophy of the LV apex. Systolic function was  normal. The estimated ejection fraction was in the range of 60%   to 65%. Wall motion was normal; there were no regional wall   motion abnormalities. Features are consistent with a pseudonormal   left ventricular filling pattern, with concomitant abnormal   relaxation and increased filling pressure (grade 2 diastolic   dysfunction). - Aortic valve: There was no stenosis. - Mitral valve: Mildly calcified annulus. There was trivial   regurgitation. - Left atrium: The atrium was moderately dilated. - Right ventricle: The cavity size was normal. Systolic function   was normal. - Tricuspid valve: Peak RV-RA gradient (S): 45 mm Hg. - Pulmonary arteries: PA peak pressure: 48 mm Hg (S). - Inferior vena cava: The vessel was normal in size. The   respirophasic diameter changes were in the normal range (= 50%),   consistent with normal central venous pressure. Impressions: - Normal LV size with asymmetric hypertrophy of the LV apex   (spade-shaped ventricle). This is suggestive of apical   hypertrophic cardiomyopathy. EF 60-65%. Normal RV size and   systolic function. No significant valvular abnormalities. Mild   pulmonary hypertension.   ASSESSMENT & PLAN:   Dyspnea: this could certainly be multifactorial given deconditioning and obesity. Recent echo showed normal LV function with asymmetric hypertrophy of the LV apex (spade-shaped ventricle) suggestive of  apical hypertrophic cardiomyopathy as well as G2DD, no WMAs, mod LAE, and mild pulm HTN with PA pressure 45. ECG today showed sinus HR 83 with TWI in V3-V6 which could be repolarization abnormalities. No previous ECG to compare to. Dr. Curt Bears (DOD) reviewed case with me today and suggested checking a BNP and changing home HCTZ 50mg  daily to lasix if elevated. Will also check a D Dimer given asymmetrical R>L LE edema and dyspnea. She is not tachycardic or hypoxic.   Abnormal echo: with possible apical variant HOCM. No chest pain. No family history of sudden cardiac death or personal syncope. Continue Toprol XL 100mg  daily. Will defer to Dr. Johnsie Cancel if any further testing is warranted.   HTN: BP is very elevated today, but she has a long hx of white coat HTN. She regularly checks her BP at home and gets readings ~ 150-160/60-70. Will increase amlodipine from 5mg  to 10 mg daily. Her LE edema predated her amlodipine use.   HLD: previously did not tolerate Lovastatin. On Co Q 10 and lipids have been moderately well controlled.  DMT2: HgA1c 6.1 most recently. Not on any medications for his  CKD: creat has been worsening to ~1.91. She will establish care with Dr. Posey Pronto next Monday. She also has a hx of "multiple hypoechoic lesions, predominately within the right kidney,which do not meet criteria for simple cysts" seen on renal US in 03/2015. Follow up MR recommended.   Medication Adjustments/Labs and Tests Ordered: Current medicines are reviewed at length with the patient today.  Concerns regarding medicines are outlined above.  Medication changes, Labs and Tests ordered today are listed in the Patient Instructions below. Patient Instructions  Medication Instructions:  Your physician has recommended you make the following change in your medication:  1.  INCREASE the Amlodipine to 10 mg taking 1 tablet daily   Labwork: TODAY:  BMET & PRO BNP, DDimer  Testing/Procedures: None  ordered  Follow-Up: Your physician recommends that you schedule a follow-up appointment in: WITH DR. Johnsie Cancel IN 3 MONTHS   Any Other Special Instructions Will Be Listed Below (If Applicable).    If you need a refill on your cardiac medications before  your next appointment, please call your pharmacy.      Signed, Angelena Form, PA-C  11/30/2016 1:09 PM    [facility] [address], [place], [place]  [address] Phone: [phone]; Fax: [phone]

## 2016-11-30 ENCOUNTER — Encounter (INDEPENDENT_AMBULATORY_CARE_PROVIDER_SITE_OTHER): Payer: Self-pay

## 2016-11-30 ENCOUNTER — Other Ambulatory Visit: Payer: Self-pay | Admitting: *Deleted

## 2016-11-30 ENCOUNTER — Ambulatory Visit (INDEPENDENT_AMBULATORY_CARE_PROVIDER_SITE_OTHER): Payer: Medicare Other | Admitting: Physician Assistant

## 2016-11-30 ENCOUNTER — Encounter: Payer: Self-pay | Admitting: Physician Assistant

## 2016-11-30 VITALS — BP 180/70 | HR 83 | Ht 63.0 in | Wt 239.8 lb

## 2016-11-30 DIAGNOSIS — E785 Hyperlipidemia, unspecified: Secondary | ICD-10-CM

## 2016-11-30 DIAGNOSIS — I10 Essential (primary) hypertension: Secondary | ICD-10-CM

## 2016-11-30 DIAGNOSIS — R931 Abnormal findings on diagnostic imaging of heart and coronary circulation: Secondary | ICD-10-CM | POA: Diagnosis not present

## 2016-11-30 DIAGNOSIS — R0600 Dyspnea, unspecified: Secondary | ICD-10-CM

## 2016-11-30 DIAGNOSIS — E118 Type 2 diabetes mellitus with unspecified complications: Secondary | ICD-10-CM | POA: Diagnosis not present

## 2016-11-30 DIAGNOSIS — N189 Chronic kidney disease, unspecified: Secondary | ICD-10-CM | POA: Diagnosis not present

## 2016-11-30 DIAGNOSIS — R7989 Other specified abnormal findings of blood chemistry: Secondary | ICD-10-CM

## 2016-11-30 LAB — D-DIMER, QUANTITATIVE (NOT AT ARMC): D-DIMER: 1.1 mg/L FEU — ABNORMAL HIGH (ref 0.00–0.49)

## 2016-11-30 MED ORDER — AMLODIPINE BESYLATE 10 MG PO TABS: 10.0000 mg | ORAL_TABLET | Freq: Every day | ORAL | 1 refills | Status: DC

## 2016-11-30 NOTE — Patient Instructions (Addendum)
Medication Instructions:  Your physician has recommended you make the following change in your medication:  1.  INCREASE the Amlodipine to 10 mg taking 1 tablet daily   Labwork: TODAY:  BMET & PRO BNP  Testing/Procedures: None ordered  Follow-Up: Your physician recommends that you schedule a follow-up appointment in: WITH DR. Johnsie Cancel IN 3 MONTHS   Any Other Special Instructions Will Be Listed Below (If Applicable).    If you need a refill on your cardiac medications before your next appointment, please call your pharmacy.

## 2016-12-01 ENCOUNTER — Other Ambulatory Visit: Payer: Self-pay | Admitting: Physician Assistant

## 2016-12-01 ENCOUNTER — Telehealth: Payer: Self-pay | Admitting: Physician Assistant

## 2016-12-01 ENCOUNTER — Telehealth: Payer: Self-pay | Admitting: *Deleted

## 2016-12-01 DIAGNOSIS — Z79899 Other long term (current) drug therapy: Secondary | ICD-10-CM

## 2016-12-01 LAB — BASIC METABOLIC PANEL
BUN/Creatinine Ratio: 21 (ref 12–28)
BUN: 39 mg/dL — ABNORMAL HIGH (ref 8–27)
CO2: 22 mmol/L (ref 18–29)
Calcium: 10.1 mg/dL (ref 8.7–10.3)
Chloride: 100 mmol/L (ref 96–106)
Creatinine, Ser: 1.88 mg/dL — ABNORMAL HIGH (ref 0.57–1.00)
GFR calc Af Amer: 28 mL/min/{1.73_m2} — ABNORMAL LOW (ref >59)
GFR calc non Af Amer: 25 mL/min/{1.73_m2} — ABNORMAL LOW (ref >59)
Glucose: 99 mg/dL (ref 65–99)
Potassium: 5.8 mmol/L — ABNORMAL HIGH (ref 3.5–5.2)
Sodium: 138 mmol/L (ref 134–144)

## 2016-12-01 LAB — PRO B NATRIURETIC PEPTIDE: NT-Pro BNP: 1252 pg/mL — ABNORMAL HIGH (ref 0–738)

## 2016-12-01 MED ORDER — FUROSEMIDE 40 MG PO TABS: 60.0000 mg | ORAL_TABLET | Freq: Every day | ORAL | 3 refills | Status: DC

## 2016-12-01 NOTE — Telephone Encounter (Signed)
-----   Message from Eileen Stanford, PA-C sent at 12/01/2016  8:35 AM EDT ----- Labs show that her kidney function is stable. Her BNP is elevated consistent with diastolic CHF. Also her potassium is elevated.  She currently take Triamterene- HCTZ 75-50mg . I think we will need to switch her to a stronger diuretic to get some of the extra fluid off her. This will hopefully help her SOB. PLAN: stop Triamterene-HCTZ and start Lasix 60mg  daily. Also this should help with high potassium. We will need to recheck labs on Friday. Also lets put her on my schedule for next week, Thursday at 11:30am for close follow up

## 2016-12-01 NOTE — Telephone Encounter (Signed)
Returned pts call and reverified her dose, pt was confused from the way the pharmacy typed the instructions out on the bottle. Pt was very grateful for the call back.

## 2016-12-01 NOTE — Telephone Encounter (Signed)
Patient calling in regards to lasix medication. Patient would like to verify dosage instructions. Thanks.

## 2016-12-01 NOTE — Telephone Encounter (Signed)
Pt has been made aware of her lab results. She will make the following changes: D/C Triamtrene-Hctz Start Lasix 60 mg qd BMET 12/03/16 Appt with Nell Range, PA-C 12/09/16  She is awaiting on scheduling dept to call to set up the VQ Scan. Pt very appreciative for the call.

## 2016-12-02 ENCOUNTER — Other Ambulatory Visit: Payer: Self-pay | Admitting: *Deleted

## 2016-12-02 DIAGNOSIS — R7989 Other specified abnormal findings of blood chemistry: Secondary | ICD-10-CM

## 2016-12-03 ENCOUNTER — Other Ambulatory Visit: Payer: Medicare Other | Admitting: *Deleted

## 2016-12-03 DIAGNOSIS — Z79899 Other long term (current) drug therapy: Secondary | ICD-10-CM | POA: Diagnosis not present

## 2016-12-03 LAB — BASIC METABOLIC PANEL
BUN/Creatinine Ratio: 26 (ref 12–28)
BUN: 57 mg/dL — ABNORMAL HIGH (ref 8–27)
CO2: 20 mmol/L (ref 18–29)
Calcium: 9.9 mg/dL (ref 8.7–10.3)
Chloride: 96 mmol/L (ref 96–106)
Creatinine, Ser: 2.23 mg/dL — ABNORMAL HIGH (ref 0.57–1.00)
GFR calc Af Amer: 23 mL/min/{1.73_m2} — ABNORMAL LOW (ref >59)
GFR calc non Af Amer: 20 mL/min/{1.73_m2} — ABNORMAL LOW (ref >59)
Glucose: 105 mg/dL — ABNORMAL HIGH (ref 65–99)
Potassium: 4.3 mmol/L (ref 3.5–5.2)
Sodium: 140 mmol/L (ref 134–144)

## 2016-12-06 ENCOUNTER — Ambulatory Visit (HOSPITAL_COMMUNITY)
Admission: RE | Admit: 2016-12-06 | Discharge: 2016-12-06 | Disposition: A | Discharge status: Home / Self Care | Payer: Medicare Other | Source: Ambulatory Visit | Attending: Physician Assistant | Admitting: Physician Assistant

## 2016-12-06 ENCOUNTER — Telehealth: Payer: Self-pay | Admitting: Physician Assistant

## 2016-12-06 ENCOUNTER — Telehealth: Payer: Self-pay | Admitting: *Deleted

## 2016-12-06 ENCOUNTER — Other Ambulatory Visit: Payer: Self-pay | Admitting: Nephrology

## 2016-12-06 DIAGNOSIS — N2581 Secondary hyperparathyroidism of renal origin: Secondary | ICD-10-CM | POA: Diagnosis not present

## 2016-12-06 DIAGNOSIS — I129 Hypertensive chronic kidney disease with stage 1 through stage 4 chronic kidney disease, or unspecified chronic kidney disease: Secondary | ICD-10-CM | POA: Diagnosis not present

## 2016-12-06 DIAGNOSIS — R7989 Other specified abnormal findings of blood chemistry: Secondary | ICD-10-CM | POA: Insufficient documentation

## 2016-12-06 DIAGNOSIS — R0602 Shortness of breath: Secondary | ICD-10-CM | POA: Diagnosis not present

## 2016-12-06 DIAGNOSIS — N184 Chronic kidney disease, stage 4 (severe): Secondary | ICD-10-CM

## 2016-12-06 DIAGNOSIS — D631 Anemia in chronic kidney disease: Secondary | ICD-10-CM | POA: Diagnosis not present

## 2016-12-06 DIAGNOSIS — Z6839 Body mass index (BMI) 39.0-39.9, adult: Secondary | ICD-10-CM | POA: Diagnosis not present

## 2016-12-06 MED ORDER — TECHNETIUM TO 99M ALBUMIN AGGREGATED
4.7000 | Freq: Once | INTRAVENOUS | Status: AC
  Administered 2016-12-06: 4.2 via INTRAVENOUS

## 2016-12-06 MED ORDER — TECHNETIUM TC 99M DIETHYLENETRIAME-PENTAACETIC ACID
30.0000 | Freq: Once | INTRAVENOUS | Status: AC
  Administered 2016-12-06: 30 via RESPIRATORY_TRACT

## 2016-12-06 MED ORDER — FUROSEMIDE 40 MG PO TABS: 40.0000 mg | ORAL_TABLET | Freq: Every day | ORAL | 3 refills | Status: DC

## 2016-12-06 NOTE — Telephone Encounter (Signed)
-----   Message from Eileen Stanford, [place] sent at 12/03/2016  4:03 PM EDT ----- Creat a little elevated but not significantly worse than in the past. I called pt and told her to go down from lasix 60mg  --> 40mg  daily. I will re check blood work next Thursday when I see her in the office

## 2016-12-06 NOTE — Telephone Encounter (Signed)
Changed pts Lasix from 60 mg qd to 40, per Nell Range, PA-C, she already spoke to pt.

## 2016-12-06 NOTE — Telephone Encounter (Signed)
New message    Pt is calling asking for jennifer W.

## 2016-12-06 NOTE — Telephone Encounter (Signed)
-----   Message from Eileen Stanford, PA-C sent at 12/06/2016 12:58 PM EDT ----- V/Q negative for PE.

## 2016-12-07 NOTE — Progress Notes (Signed)
Cardiology Office Note    Date:  12/09/2016   ID:  Tabitha Black, DOB July 06, 1933, MRN [id number]  PCP:  [name], MD  Cardiologist:  Dr. Johnsie Cancel  CC: follow up - diastolic CHF   History of Present Illness:  Tabitha Black is a 83 y.o. 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   Recently seen by PCP, Dr. Marisue Humble, for general follow up and worsening DOE. Labs 11/04/16: creat 1.91, GFR 25, TC 191, TG 266, HDL 43, LDL 94, Hga1c 6.1, K 5.1. I reviewed his last note which reported a long history of LE edema that pre dated amlodipine use. She has been on HCTZ 50mg  daily for ~ 20 years. She had hyperkalemia with both ACE and ARB and worsening kidney function to around 2 and therefore discontinued. She has had difficult to control HTN and has significant white coat HTN. Given shortness of breath and worsening LE edema, an echocardiogram was ordered. This showed normal LV function with asymmetric hypertrophy of the LV apex(spade-shaped ventricle) suggestive of apical hypertrophic cardiomyopathy as well as G2DD, no WMAs, mod LAE, and mild pulm HTN with PA pressure 45.   I saw her in clinic on 11/30/16 for new patient evaluation. She had significant dyspnea and 2+ LE edema, R>L. Amlodipine was increased from 5--> 10mg  given BP elevation to 180/70. Follow up labs showed elevated D dimer to 1.10, creat 1.88, K 5.8, pro BNP 1252. Her triamterene-HCTZ 75-50 was discontinued and she was started on lasix 60mg  daily. Follow up BMET on 12/03/16 showed normalization of potassium and creat increased to 2.23. Lasix was decreased to 40mg  daily. Also Ddimer was mildly elevated but V/Q scan negative for PE.   Today she presents to clinic for follow up. Her LE edema is much improved. Was very upset about her K being so high. Breathing is unchanged with  significant DOE. Just feels like she is dragging with unexplained fatigue. Complains of a knot in her right LE which sometimes hurts. She has had LE edema In that leg since an insect bite over a year ago. No chest pain. No orthopnea or PND. No dizziness or syncope. No blood in stool or urine .   Past Medical History:  Diagnosis Date  . CKD (chronic kidney disease)   . Diabetes mellitus without complication ([place])    type II  . GERD (gastroesophageal reflux disease)   . High cholesterol   . Hypertension   . Osteoarthritis   . Skin cancer, basal cell     Past Surgical History:  Procedure Laterality Date  . ABDOMINAL HYSTERECTOMY N/A   . APPENDECTOMY N/A 1953  . BACK SURGERY    . CHOLECYSTECTOMY    . COLONOSCOPY N/A 02/22/2008  . TOTAL KNEE ARTHROPLASTY Right   . UMBILICAL HERNIA REPAIR N/A 04/02/2015   Procedure: LAPAROSCOPIC UMBILICAL HERNIA REPAIR WITH VENTRALIGHT MESH;  Surgeon: Ralene Ok, MD;  Location: [facility];  Service: General;  Laterality: N/A;    Current Medications: Outpatient Medications Prior to Visit  Medication Sig Dispense Refill  . amLODipine (NORVASC) 10 MG tablet Take 1 tablet (10 mg total) by mouth daily. 90 tablet 1  . aspirin EC 81 MG tablet Take 81 mg by mouth every other day.     . Cholecalciferol (VITAMIN D3) 2000 UNITS capsule Take 2,000 Units by  mouth daily.    . cyclobenzaprine (FLEXERIL) 10 MG tablet Take 5-10 mg by mouth 3 (three) times daily as needed for muscle spasms.    Marland Kitchen doxazosin (CARDURA) 2 MG tablet Take 2 mg by mouth daily.  3  . fenofibrate 160 MG tablet Take 160 mg by mouth every evening.     . furosemide (LASIX) 40 MG tablet Take 1 tablet (40 mg total) by mouth daily. 30 tablet 3  . metoprolol succinate (TOPROL-XL) 100 MG 24 hr tablet Take 100 mg by mouth every evening. Take with or immediately following a meal.    . HYDROcodone-acetaminophen (NORCO/VICODIN) 5-325 MG per tablet Take 1-2 tablets by mouth 2 (two) times daily. 40 tablet 0    No facility-administered medications prior to visit.      Allergies:   Other; Penicillins; Lovastatin; Lisinopril; Neomycin sulfate [neomycin]; Niaspan [niacin]; Valsartan; Vytorin [ezetimibe-simvastatin]; and Zetia [ezetimibe]   Social History   Social History  . Marital status: Married    Spouse name: N/A  . Number of children: N/A  . Years of education: N/A   Social History Main Topics  . Smoking status: Never Smoker  . Smokeless tobacco: Never Used  . Alcohol use Yes     Comment: twice a year  . Drug use: No  . Sexual activity: Not Asked   Other Topics Concern  . None   Social History Narrative  . None     Family History:  The patient's family history includes Breast cancer in her sister; Heart attack in her father; Heart failure in her mother; Hypertension in her mother; Prostate cancer in her father.     ROS:   Please see the history of present illness.    ROS All other systems reviewed and are negative.   PHYSICAL EXAM:   VS:  BP (!) 162/50   Pulse 65   Ht 5\' 3"  (1.6 m)   Wt 233 lb 1.9 oz (105.7 kg)   BMI 41.30 kg/m    GEN: Well nourished, well developed, in no acute distress, obese HEENT: normal  Neck: no JVD, carotid bruits, or masses Cardiac: RRR; no murmurs, rubs, or gallops, 2+ right LE edema with small, tender knot on calf, 1+ LLE edema   Respiratory:  clear to auscultation bilaterally, normal work of breathing GI: soft, nontender, nondistended, + BS MS: no deformity or atrophy  Skin: warm and dry, no rash Neuro:  Alert and Oriented x 3, Strength and sensation are intact Psych: euthymic mood, full affect   Wt Readings from Last 3 Encounters:  12/09/16 233 lb 1.9 oz (105.7 kg)  11/30/16 239 lb 12.8 oz (108.8 kg)  04/01/15 218 lb 14.7 oz (99.3 kg)      Studies/Labs Reviewed:   EKG:  EKG is NOT ordered today.   Recent Labs: 11/30/2016: NT-Pro BNP 1,252 12/03/2016: BUN 57; Creatinine, Ser 2.23; Potassium 4.3; Sodium 140   Lipid Panel No  results found for: CHOL, TRIG, HDL, CHOLHDL, VLDL, LDLCALC, LDLDIRECT  Additional studies/ records that were reviewed today include:  2D ECHO: 11/18/2016 LV EF: 60% - 65% Study Conclusions - Left ventricle: The cavity size was normal. There appeared to asymmetric hypertrophy of the LV apex. Systolic function was normal. The estimated ejection fraction was in the range of 60% to 65%. Wall motion was normal; there were no regional wall motion abnormalities. Features are consistent with a pseudonormal left ventricular filling pattern, with concomitant abnormal relaxation and increased filling pressure (grade 2 diastolic dysfunction). -  Aortic valve: There was no stenosis. - Mitral valve: Mildly calcified annulus. There was trivial regurgitation. - Left atrium: The atrium was moderately dilated. - Right ventricle: The cavity size was normal. Systolic function was normal. - Tricuspid valve: Peak RV-RA gradient (S): 45 mm Hg. - Pulmonary arteries: PA peak pressure: 48 mm Hg (S). - Inferior vena cava: The vessel was normal in size. The respirophasic diameter changes were in the normal range (= 50%), consistent with normal central venous pressure. Impressions: - Normal LV size with asymmetric hypertrophy of the LV apex (spade-shaped ventricle). This is suggestive of apical hypertrophic cardiomyopathy. EF 60-65%. Normal RV size and systolic function. No significant valvular abnormalities. Mild pulmonary hypertension.    ASSESSMENT & PLAN:   Acute on chronic diastolic CHF: weight is down 6 lbs (239--> 233). Creat increased from 1.88--> 2.23 with laisx 60mg  daily. I reduced lasix to 40mg  daily. She appears more euvolemic today. She has chronic LE edema, which is improved from last visit. Will check a BMET and continue lasix at current dose.  Dyspnea: V/Q scan negative for PE. DOE has not improved much with diuresis. Also has unexplained fatigue. Abdominal  CT scan from 2016 showed aortic atherosclerosis. Will get a lexiscan myoview to r/o ischemia.   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 No chest pain. No family history of sudden cardiac death or personal syncope. Continue Toprol XL 100mg  daily. Will defer to Dr. Johnsie Cancel if any further testing is warranted.   HTN: BP elevated today but upon my recheck it was 142/60, she also didn't take her lasix today because she has been out and about. BP have been moderately well controlled at home.   HLD: previously did not tolerate Lovastatin. On Co Q 10 and lipids have been moderately well controlled.  DMT2: HgA1c 6.1 most recently. Not on any medications for his  CKD: now followed by Dr. Posey Pronto. Scheduled for renal ultrasound. Creat increased from 1.88--> 2.23 with laisx 60mg  daily. I reduced lasix to 40mg  daily. Will recheck labs today.   Unequal LE edema with knot: will check LE venous dopplers to rule out DVT  Elevated D Dimer: V/Q scan neg for PE.   Medication Adjustments/Labs and Tests Ordered: Current medicines are reviewed at length with the patient today.  Concerns regarding medicines are outlined above.  Medication changes, Labs and Tests ordered today are listed in the Patient Instructions below. Patient Instructions  Medication Instructions:  Your physician recommends that you continue on your current medications as directed. Please refer to the Current Medication list given to you today.   Labwork: TODAY:  BMET  Testing/Procedures: Your physician has requested that you have an echocardiogram. Echocardiography is a painless test that uses sound waves to create images of your heart. It provides your doctor with information about the size and shape of your heart and how well your heart's chambers and valves are working. This procedure takes approximately one hour. There are no restrictions for this procedure.  Your physician has requested that you have a lexiscan myoview. For  further information please visit [email or web address]. Please follow instruction sheet, as given.  Your physician has requested that you have a lower or upper extremity venous duplex. This test is an ultrasound of the veins in the legs or arms. It looks at venous blood flow that carries blood from the heart to the legs or arms. Allow one hour for a Lower Venous exam. Allow thirty minutes for an Upper Venous exam. There are no restrictions  or special instructions.    Follow-Up: Your physician recommends that you schedule a follow-up appointment in: SEE DR. NISHAN AS PLANNED   Any Other Special Instructions Will Be Listed Below (If Applicable).   Pharmacologic Stress Electrocardiogram Introduction A pharmacologic stress electrocardiogram is a heart (cardiac) test that uses nuclear imaging to evaluate the blood supply to your heart. This test may also be called a pharmacologic stress electrocardiography. Pharmacologic means that a medicine is used to increase your heart rate and blood pressure. This stress test is done to find areas of poor blood flow to the heart by determining the extent of coronary artery disease (CAD). Some people exercise on a treadmill, which naturally increases the blood flow to the heart. For those people unable to exercise on a treadmill, a medicine is used. This medicine stimulates your heart and will cause your heart to beat harder and more quickly, as if you were exercising. Pharmacologic stress tests can help determine:  The adequacy of blood flow to your heart during increased levels of activity in order to clear you for discharge home.  The extent of coronary artery blockage caused by CAD.  Your prognosis if you have suffered a heart attack.  The effectiveness of cardiac procedures done, such as an angioplasty, which can increase the circulation in your coronary arteries.  Causes of chest pain or pressure. LET [facility] CARE PROVIDER KNOW ABOUT:  Any  allergies you have.  All medicines you are taking, including vitamins, herbs, eye drops, creams, and over-the-counter medicines.  Previous problems you or members of your family have had with the use of anesthetics.  Any blood disorders you have.  Previous surgeries you have had.  Medical conditions you have.  Possibility of pregnancy, if this applies.  If you are currently breastfeeding. RISKS AND COMPLICATIONS Generally, this is a safe procedure. However, as with any procedure, complications can occur. Possible complications include:  You develop pain or pressure in the following areas:  Chest.  Jaw or neck.  Between your shoulder blades.  Radiating down your left arm.  Headache.  Dizziness or light-headedness.  Shortness of breath.  Increased or irregular heartbeat.  Low blood pressure.  Nausea or vomiting.  Flushing.  Redness going up the arm and slight pain during injection of medicine.  Heart attack (rare). BEFORE THE PROCEDURE  Avoid all forms of caffeine for 24 hours before your test or as directed by your health care provider. This includes coffee, tea (even decaffeinated tea), caffeinated sodas, chocolate, cocoa, and certain pain medicines.  Follow your health care provider's instructions regarding eating and drinking before the test.  Take your medicines as directed at regular times with water unless instructed otherwise. Exceptions may include:  If you have diabetes, ask how you are to take your insulin or pills. It is common to adjust insulin dosing the morning of the test.  If you are taking beta-blocker medicines, it is important to talk to your health care provider about these medicines well before the date of your test. Taking beta-blocker medicines may interfere with the test. In some cases, these medicines need to be changed or stopped 24 hours or more before the test.  If you wear a nitroglycerin patch, it may need to be removed prior to  the test. Ask your health care provider if the patch should be removed before the test.  If you use an inhaler for any breathing condition, bring it with you to the test.  If you are an  outpatient, bring a snack so you can eat right after the stress phase of the test.  Do not smoke for 4 hours prior to the test or as directed by your health care provider.  Do not apply lotions, powders, creams, or oils on your chest prior to the test.  Wear comfortable shoes and clothing. Let your health care provider know if you were unable to complete or follow the preparations for your test. PROCEDURE  Multiple patches (electrodes) will be put on your chest. If needed, small areas of your chest may be shaved to get better contact with the electrodes. Once the electrodes are attached to your body, multiple wires will be attached to the electrodes, and your heart rate will be monitored.  An IV access will be started. A nuclear trace (isotope) is given. The isotope may be given intravenously, or it may be swallowed. Nuclear refers to several types of radioactive isotopes, and the nuclear isotope lights up the arteries so that the nuclear images are clear. The isotope is absorbed by your body. This results in low radiation exposure.  A resting nuclear image is taken to show how your heart functions at rest.  A medicine is given through the IV access.  A second scan is done about 1 hour after the medicine injection and determines how your heart functions under stress.  During this stress phase, you will be connected to an electrocardiogram machine. Your blood pressure and oxygen levels will be monitored. What to expect after the procedure  Your heart rate and blood pressure will be monitored after the test.  You may return to your normal schedule, including diet,activities, and medicines, unless your health care provider tells you otherwise. This information is not intended to replace advice given to you  by your health care provider. Make sure you discuss any questions you have with your health care provider. Document Released: 11/21/2008 Document Revised: 12/11/2015 Document Reviewed: 01/12/2016 Elsevier Interactive Patient Education  2017 [employer].    If you need a refill on your cardiac medications before your next appointment, please call your pharmacy.      Mable Fill, PA-C  12/09/2016 12:47 PM    [facility] [address], [place], [place]  [address] Phone: [phone]; Fax: [phone]

## 2016-12-09 ENCOUNTER — Telehealth: Payer: Self-pay | Admitting: *Deleted

## 2016-12-09 ENCOUNTER — Ambulatory Visit (INDEPENDENT_AMBULATORY_CARE_PROVIDER_SITE_OTHER): Payer: Medicare Other | Admitting: Physician Assistant

## 2016-12-09 ENCOUNTER — Encounter: Payer: Self-pay | Admitting: Physician Assistant

## 2016-12-09 VITALS — BP 162/50 | HR 65 | Ht 63.0 in | Wt 233.1 lb

## 2016-12-09 DIAGNOSIS — M79605 Pain in left leg: Secondary | ICD-10-CM

## 2016-12-09 DIAGNOSIS — M79604 Pain in right leg: Secondary | ICD-10-CM | POA: Diagnosis not present

## 2016-12-09 DIAGNOSIS — I10 Essential (primary) hypertension: Secondary | ICD-10-CM | POA: Diagnosis not present

## 2016-12-09 DIAGNOSIS — E118 Type 2 diabetes mellitus with unspecified complications: Secondary | ICD-10-CM

## 2016-12-09 DIAGNOSIS — I5032 Chronic diastolic (congestive) heart failure: Secondary | ICD-10-CM | POA: Diagnosis not present

## 2016-12-09 DIAGNOSIS — R931 Abnormal findings on diagnostic imaging of heart and coronary circulation: Secondary | ICD-10-CM

## 2016-12-09 DIAGNOSIS — N189 Chronic kidney disease, unspecified: Secondary | ICD-10-CM

## 2016-12-09 DIAGNOSIS — R0602 Shortness of breath: Secondary | ICD-10-CM | POA: Diagnosis not present

## 2016-12-09 DIAGNOSIS — R600 Localized edema: Secondary | ICD-10-CM

## 2016-12-09 DIAGNOSIS — E785 Hyperlipidemia, unspecified: Secondary | ICD-10-CM | POA: Diagnosis not present

## 2016-12-09 NOTE — Telephone Encounter (Signed)
Pt called back and she has been made aware that she don't need the echo and she has erased it from her appointments.

## 2016-12-09 NOTE — Telephone Encounter (Signed)
Left pt a message for her to call back so I could let her know that she doesn't need the ECHO since she just had one.

## 2016-12-09 NOTE — Patient Instructions (Addendum)
Medication Instructions:  Your physician recommends that you continue on your current medications as directed. Please refer to the Current Medication list given to you today.   Labwork: TODAY:  BMET  Testing/Procedures: Your physician has requested that you have an echocardiogram. Echocardiography is a painless test that uses sound waves to create images of your heart. It provides your doctor with information about the size and shape of your heart and how well your heart's chambers and valves are working. This procedure takes approximately one hour. There are no restrictions for this procedure.  Your physician has requested that you have a lexiscan myoview. For further information please visit [email or web address]. Please follow instruction sheet, as given.  Your physician has requested that you have a lower or upper extremity venous duplex. This test is an ultrasound of the veins in the legs or arms. It looks at venous blood flow that carries blood from the heart to the legs or arms. Allow one hour for a Lower Venous exam. Allow thirty minutes for an Upper Venous exam. There are no restrictions or special instructions.    Follow-Up: Your physician recommends that you schedule a follow-up appointment in: SEE DR. NISHAN AS PLANNED   Any Other Special Instructions Will Be Listed Below (If Applicable). Echocardiogram An echocardiogram, or echocardiography, uses sound waves (ultrasound) to produce an image of your heart. The echocardiogram is simple, painless, obtained within a short period of time, and offers valuable information to your health care provider. The images from an echocardiogram can provide information such as:  Evidence of coronary artery disease (CAD).  Heart size.  Heart muscle function.  Heart valve function.  Aneurysm detection.  Evidence of a past heart attack.  Fluid buildup around the heart.  Heart muscle thickening.  Assess heart valve function. Tell a  health care provider about:  Any allergies you have.  All medicines you are taking, including vitamins, herbs, eye drops, creams, and over-the-counter medicines.  Any problems you or family members have had with anesthetic medicines.  Any blood disorders you have.  Any surgeries you have had.  Any medical conditions you have.  Whether you are pregnant or may be pregnant. What happens before the procedure? No special preparation is needed. Eat and drink normally. What happens during the procedure?  In order to produce an image of your heart, gel will be applied to your chest and a wand-like tool (transducer) will be moved over your chest. The gel will help transmit the sound waves from the transducer. The sound waves will harmlessly bounce off your heart to allow the heart images to be captured in real-time motion. These images will then be recorded.  You may need an IV to receive a medicine that improves the quality of the pictures. What happens after the procedure? You may return to your normal schedule including diet, activities, and medicines, unless your health care provider tells you otherwise. This information is not intended to replace advice given to you by your health care provider. Make sure you discuss any questions you have with your health care provider. Document Released: 07/02/2000 Document Revised: 02/21/2016 Document Reviewed: 03/12/2013 Elsevier Interactive Patient Education  2017 [place].   Pharmacologic Stress Electrocardiogram Introduction A pharmacologic stress electrocardiogram is a heart (cardiac) test that uses nuclear imaging to evaluate the blood supply to your heart. This test may also be called a pharmacologic stress electrocardiography. Pharmacologic means that a medicine is used to increase your heart rate and blood pressure. This stress test  is done to find areas of poor blood flow to the heart by determining the extent of coronary artery disease  (CAD). Some people exercise on a treadmill, which naturally increases the blood flow to the heart. For those people unable to exercise on a treadmill, a medicine is used. This medicine stimulates your heart and will cause your heart to beat harder and more quickly, as if you were exercising. Pharmacologic stress tests can help determine:  The adequacy of blood flow to your heart during increased levels of activity in order to clear you for discharge home.  The extent of coronary artery blockage caused by CAD.  Your prognosis if you have suffered a heart attack.  The effectiveness of cardiac procedures done, such as an angioplasty, which can increase the circulation in your coronary arteries.  Causes of chest pain or pressure. LET [facility] CARE PROVIDER KNOW ABOUT:  Any allergies you have.  All medicines you are taking, including vitamins, herbs, eye drops, creams, and over-the-counter medicines.  Previous problems you or members of your family have had with the use of anesthetics.  Any blood disorders you have.  Previous surgeries you have had.  Medical conditions you have.  Possibility of pregnancy, if this applies.  If you are currently breastfeeding. RISKS AND COMPLICATIONS Generally, this is a safe procedure. However, as with any procedure, complications can occur. Possible complications include:  You develop pain or pressure in the following areas:  Chest.  Jaw or neck.  Between your shoulder blades.  Radiating down your left arm.  Headache.  Dizziness or light-headedness.  Shortness of breath.  Increased or irregular heartbeat.  Low blood pressure.  Nausea or vomiting.  Flushing.  Redness going up the arm and slight pain during injection of medicine.  Heart attack (rare). BEFORE THE PROCEDURE  Avoid all forms of caffeine for 24 hours before your test or as directed by your health care provider. This includes coffee, tea (even decaffeinated tea),  caffeinated sodas, chocolate, cocoa, and certain pain medicines.  Follow your health care provider's instructions regarding eating and drinking before the test.  Take your medicines as directed at regular times with water unless instructed otherwise. Exceptions may include:  If you have diabetes, ask how you are to take your insulin or pills. It is common to adjust insulin dosing the morning of the test.  If you are taking beta-blocker medicines, it is important to talk to your health care provider about these medicines well before the date of your test. Taking beta-blocker medicines may interfere with the test. In some cases, these medicines need to be changed or stopped 24 hours or more before the test.  If you wear a nitroglycerin patch, it may need to be removed prior to the test. Ask your health care provider if the patch should be removed before the test.  If you use an inhaler for any breathing condition, bring it with you to the test.  If you are an outpatient, bring a snack so you can eat right after the stress phase of the test.  Do not smoke for 4 hours prior to the test or as directed by your health care provider.  Do not apply lotions, powders, creams, or oils on your chest prior to the test.  Wear comfortable shoes and clothing. Let your health care provider know if you were unable to complete or follow the preparations for your test. PROCEDURE  Multiple patches (electrodes) will be put on your chest. If needed,  small areas of your chest may be shaved to get better contact with the electrodes. Once the electrodes are attached to your body, multiple wires will be attached to the electrodes, and your heart rate will be monitored.  An IV access will be started. A nuclear trace (isotope) is given. The isotope may be given intravenously, or it may be swallowed. Nuclear refers to several types of radioactive isotopes, and the nuclear isotope lights up the arteries so that the  nuclear images are clear. The isotope is absorbed by your body. This results in low radiation exposure.  A resting nuclear image is taken to show how your heart functions at rest.  A medicine is given through the IV access.  A second scan is done about 1 hour after the medicine injection and determines how your heart functions under stress.  During this stress phase, you will be connected to an electrocardiogram machine. Your blood pressure and oxygen levels will be monitored. What to expect after the procedure  Your heart rate and blood pressure will be monitored after the test.  You may return to your normal schedule, including diet,activities, and medicines, unless your health care provider tells you otherwise. This information is not intended to replace advice given to you by your health care provider. Make sure you discuss any questions you have with your health care provider. Document Released: 11/21/2008 Document Revised: 12/11/2015 Document Reviewed: 01/12/2016 Elsevier Interactive Patient Education  2017 [employer].    If you need a refill on your cardiac medications before your next appointment, please call your pharmacy.

## 2016-12-10 ENCOUNTER — Other Ambulatory Visit: Payer: Medicare Other

## 2016-12-10 LAB — BASIC METABOLIC PANEL
BUN/Creatinine Ratio: 23 (ref 12–28)
BUN: 51 mg/dL — ABNORMAL HIGH (ref 8–27)
CO2: 22 mmol/L (ref 18–29)
Calcium: 10 mg/dL (ref 8.7–10.3)
Chloride: 99 mmol/L (ref 96–106)
Creatinine, Ser: 2.21 mg/dL — ABNORMAL HIGH (ref 0.57–1.00)
GFR calc Af Amer: 23 mL/min/{1.73_m2} — ABNORMAL LOW (ref >59)
GFR calc non Af Amer: 20 mL/min/{1.73_m2} — ABNORMAL LOW (ref >59)
Glucose: 107 mg/dL — ABNORMAL HIGH (ref 65–99)
Potassium: 4.1 mmol/L (ref 3.5–5.2)
Sodium: 142 mmol/L (ref 134–144)

## 2016-12-14 ENCOUNTER — Ambulatory Visit
Admission: RE | Admit: 2016-12-14 | Discharge: 2016-12-14 | Disposition: A | Discharge status: Home / Self Care | Payer: Medicare Other | Source: Ambulatory Visit | Attending: Nephrology | Admitting: Nephrology

## 2016-12-14 DIAGNOSIS — N184 Chronic kidney disease, stage 4 (severe): Secondary | ICD-10-CM

## 2016-12-14 IMAGING — CT CT ABD-PELV W/O CM
2 of 4 series · 15 of 46 positions shown, 17 images · non-contrast
Comparison: Radiograph dated 03/31/2015 and ultrasound dated
10/11/2013

CLINICAL DATA: 82-year-old female with lower abdominal AA is caps

EXAM:
CT ABDOMEN AND PELVIS WITHOUT CONTRAST
TECHNIQUE: Multidetector CT imaging of the abdomen and pelvis was performed
following the standard protocol without IV contrast.

[Series 2: abd/ pelvis 5.0 i30f 1 · axial · 0.87mm/px · z∈[-467,-52]mm · 12 of 91 slices shown, 14 images]
[im 4/91  soft-tissue]
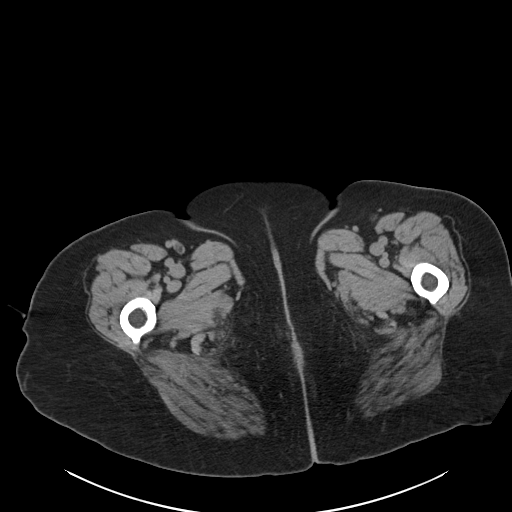
[im 4/91  bone]
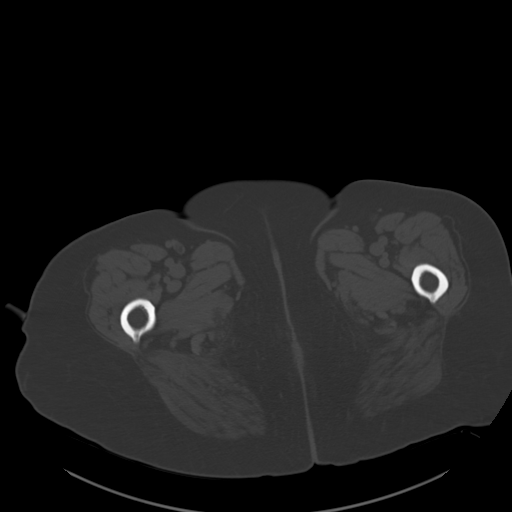
[im 12/91  soft-tissue]
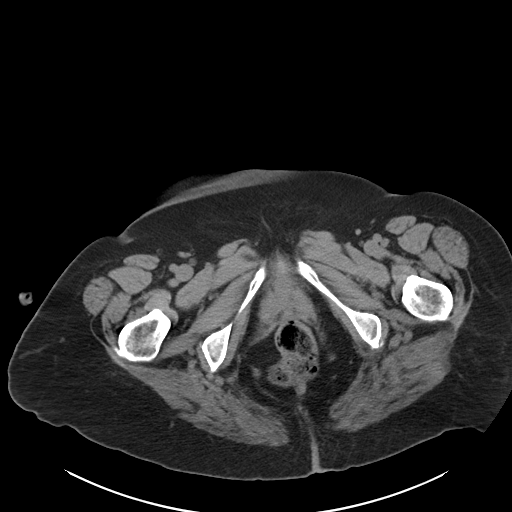
[im 19/91  soft-tissue]
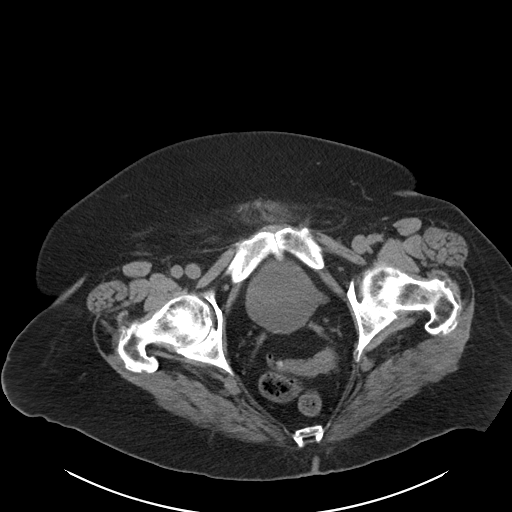
[im 27/91  soft-tissue]
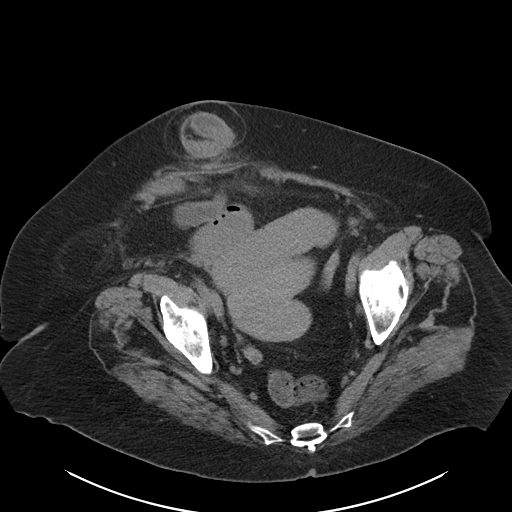
[im 34/91  soft-tissue]
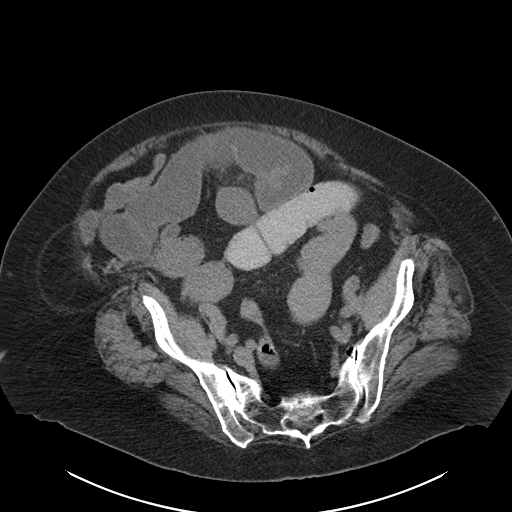
[im 42/91  soft-tissue]
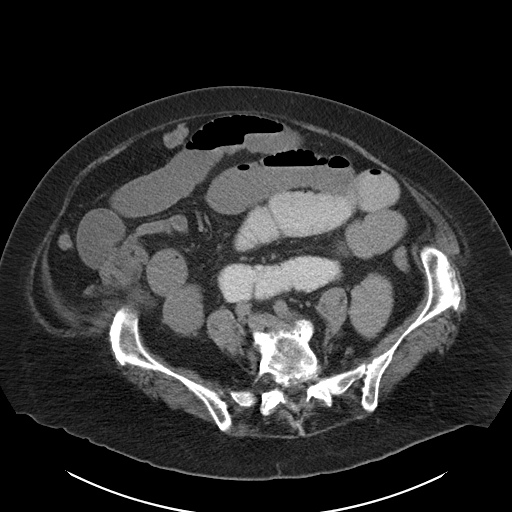
[im 49/91  soft-tissue]
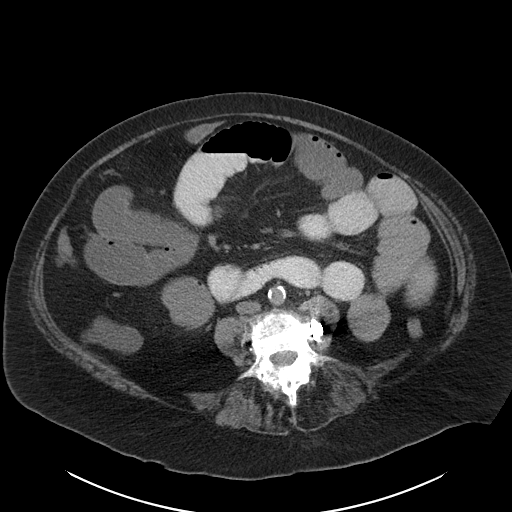
[im 57/91  soft-tissue]
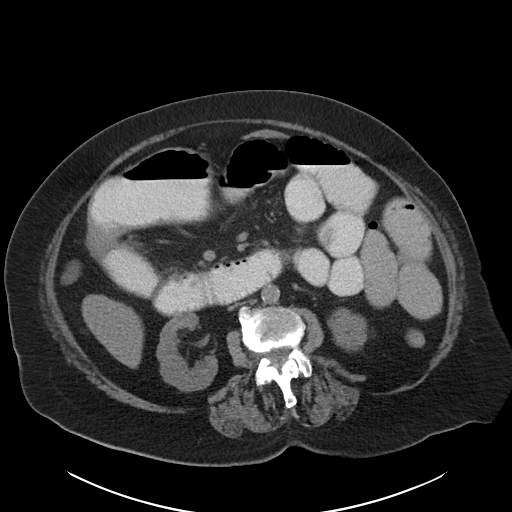
[im 64/91  soft-tissue]
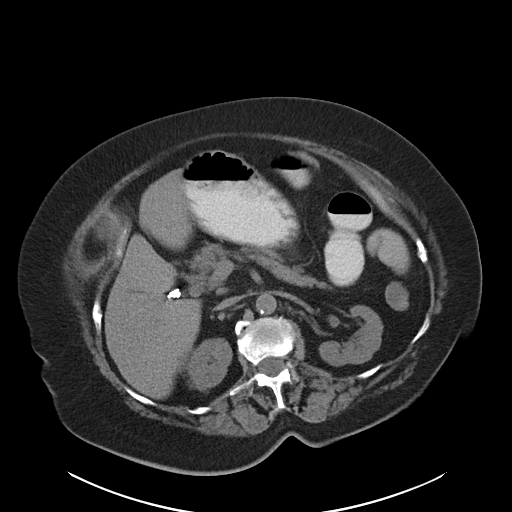
[im 64/91  bone]
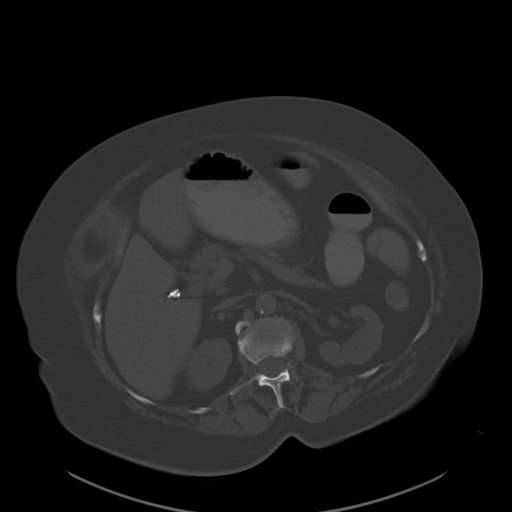
[im 72/91  soft-tissue]
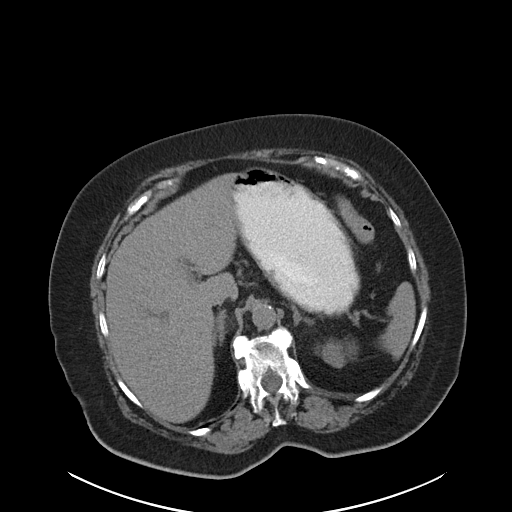
[im 79/91  soft-tissue]
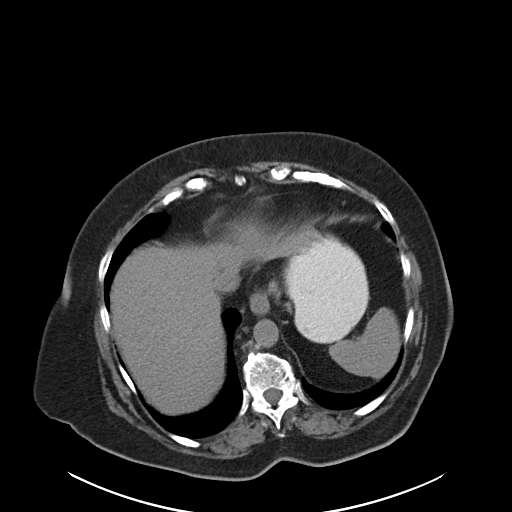
[im 87/91  soft-tissue]
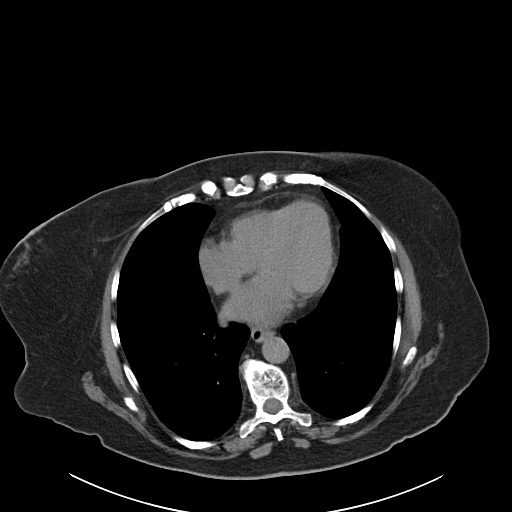

[Series 5: cor st · coronal · 0.88mm/px · 3 of 109 slices shown]
[im 37/109  soft-tissue]
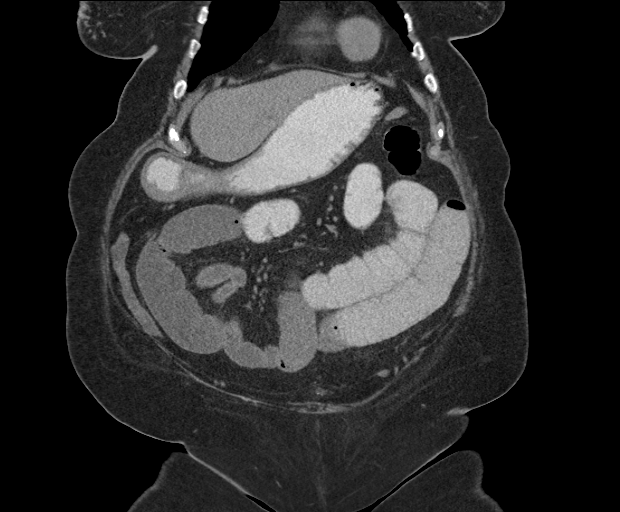
[im 49/109  soft-tissue]
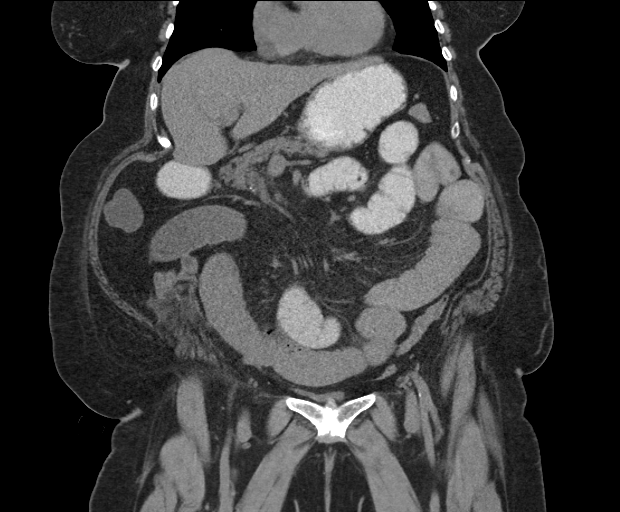
[im 61/109  soft-tissue]
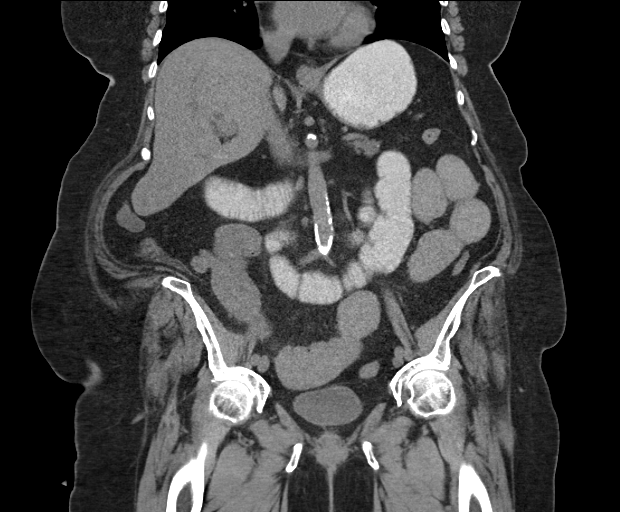

[15 of 46 positions shown; findings below may reference images not displayed]

FINDINGS: Evaluation of this exam is limited in the absence of intravenous
contrast.

The visualized lung bases are clear. No intra-abdominal free air or
free fluid.

Cholecystectomy. The liver, pancreas, spleen, right adrenal gland
appear unremarkable. There is a 12 mm left adrenal adenomas. Mild
bilateral renal atrophy. Lobulated appearance of the renal cortices.
An exophytic right renal upper pole lesion as well as an ill-defined
inferior pole hypodense lesions are not well characterized.
Ultrasound is recommended for further evaluation of the kidneys.
There is no hydronephrosis or nephrolithiasis on either side. The
visualized ureters and urinary bladder appear unremarkable. A small
left bladder wall diverticulitis noted. Hysterectomy.

There is a small umbilical hernia containing a short segment of the
distal small bowel. Oral contrast is noted within the stomach and
multiple loops of proximal and mid small bowel. There is dilatation
of the proximal and mid bowel loops. The terminal ileum demonstrates
normal caliber. A transition zone is noted at the neck of the
umbilical hernia. There is sigmoid diverticulosis without active
inflammation. The appendix is not visualized with certainty. No
inflammatory changes identified in the right lower quadrant.

There is aortoiliac atherosclerotic disease. There is no
lymphadenopathy.

There is a 2 cm peritoneal defect along the lateral pelvic wall with
herniation of the pelvic fat. There is extensive degenerative
changes of the spine. L3-L4 fixation screws as well as L4-5 disc
spacer noted. There is grade 1 L4-5 anterolisthesis. The bones are
osteopenic. Scattered lucency throughout the spine most compatible
with osteopenia. Metastatic disease is less likely. Simple
correlation recommended. No acute fracture.
IMPRESSION: Small-bowel obstruction with transition zone at the level of the
umbilicus secondary to herniation of the distal small bowel into the
umbilical hernia. No inflammatory changes or fluid collection noted
within the umbilical hernia. Clinical correlation and surgical
consult is advised.

## 2016-12-14 IMAGING — CR DG ABDOMEN 2V
2 series · 2 of 2 positions shown · non-contrast
Comparison: None.

CLINICAL DATA: Lower abdominal pain for 2 days with vomiting and
weakness.

EXAM:
ABDOMEN - 2 VIEW

[w abdomen upright *]
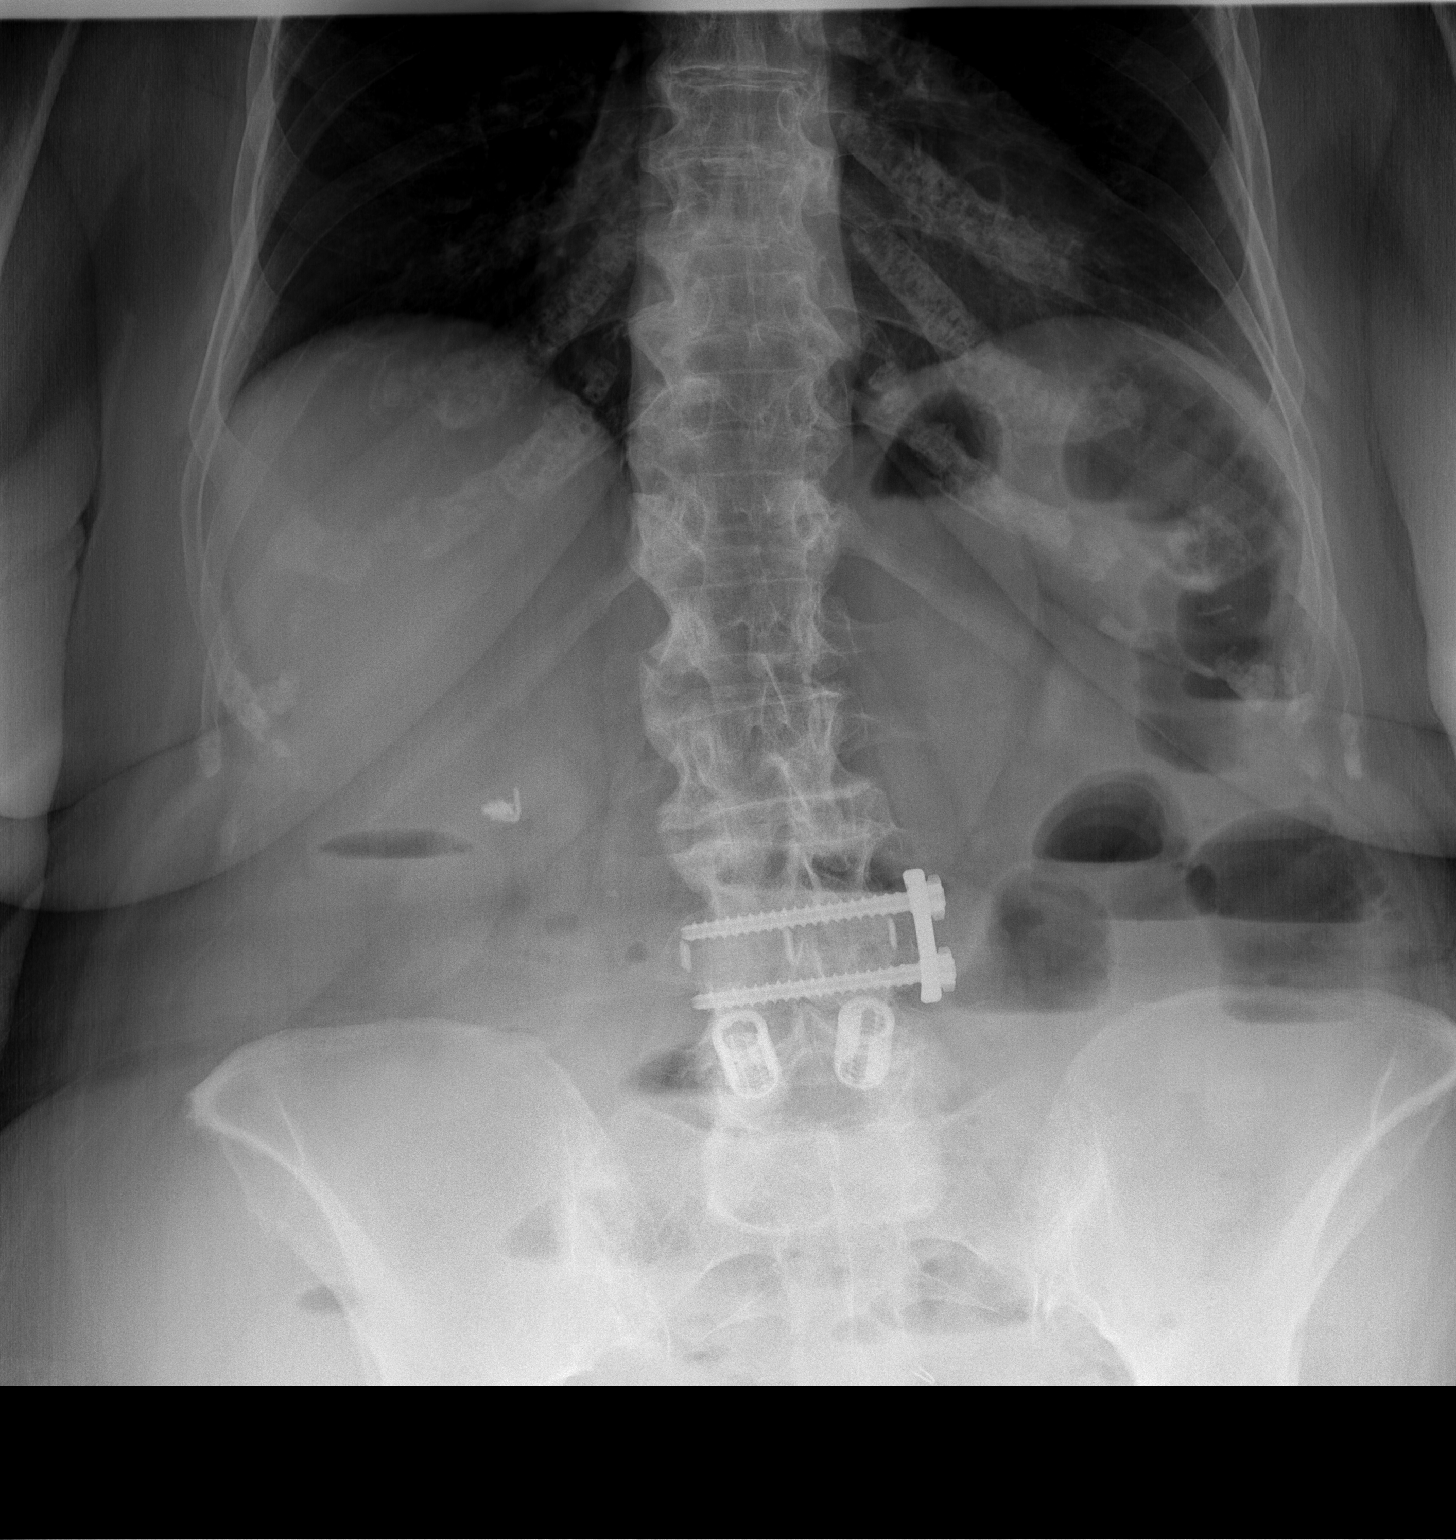

[t abdomen supine]
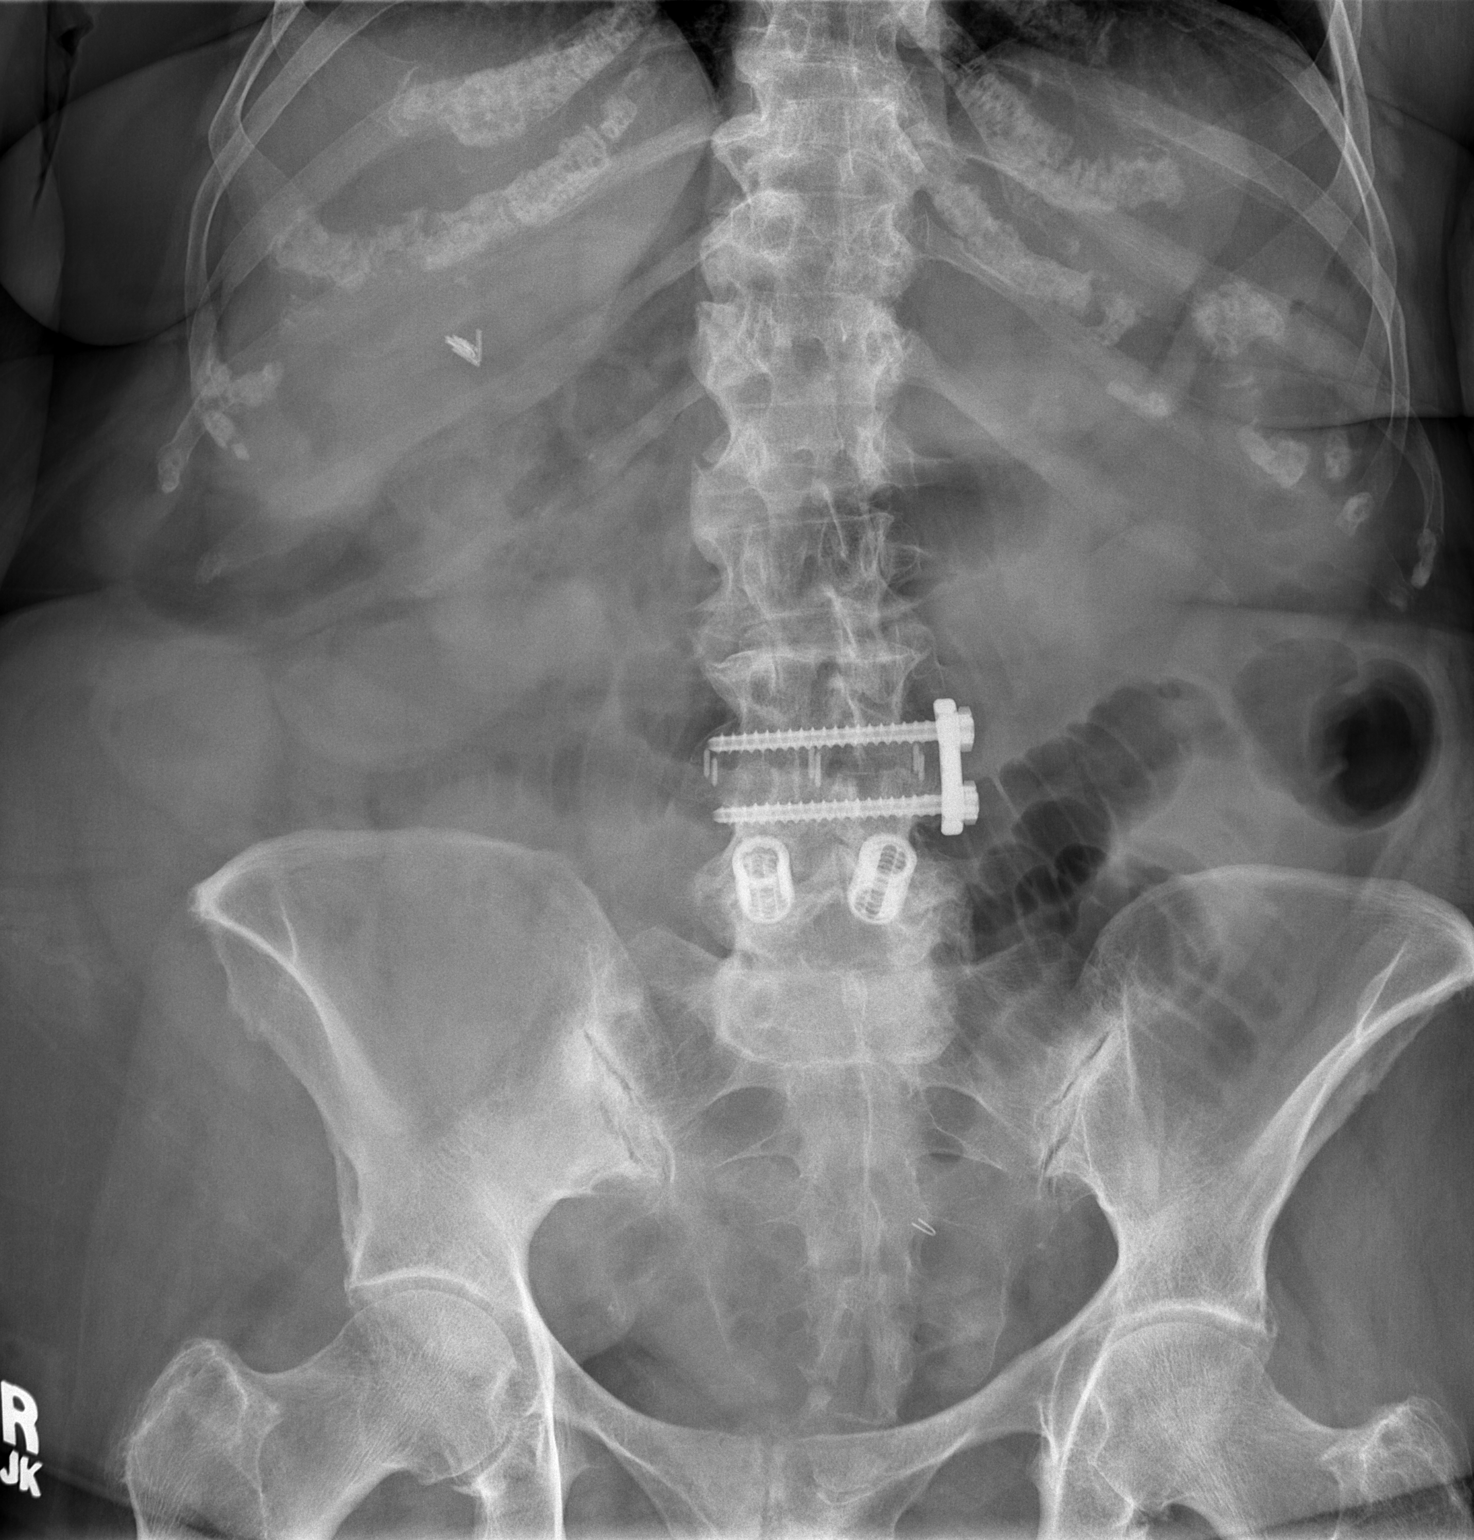

[2 of 2 positions shown; findings below may reference images not displayed]

FINDINGS: Moderately distended gas-filled loops of small bowel are seen within
the left upper quadrant, with associated air-fluid levels,
suggesting small bowel obstruction versus ileus. Additional
scattered nonspecific air-fluid levels are also seen within the
pelvis and right lower quadrant.

No evidence of free intraperitoneal air seen. Surgical clips in the
right upper quadrant likely related to previous cholecystectomy.

Fixation hardware and disc spacers noted within the lower lumbar
spine. Degenerative changes noted throughout the visualized
thoracolumbar spine, moderate in degree, with scoliosis. No
acute-appearing osseous abnormality.
IMPRESSION: Moderately distended gas-filled loops of small bowel within the left
upper quadrant, with associated air-fluid levels, consistent with
either a developing small bowel obstruction or ileus. Favor
mechanical small bowel obstruction.

Additional nonspecific air-fluid levels scattered within the pelvis
and right lower quadrant.

These results were called by telephone at the time of interpretation
on 03/31/2015 at [DATE] to Dr. [email or web address].[email or web address] GRESS , who verbally
acknowledged these results.

## 2016-12-22 ENCOUNTER — Telehealth (HOSPITAL_COMMUNITY): Payer: Self-pay | Admitting: *Deleted

## 2016-12-22 NOTE — Telephone Encounter (Signed)
Left message on voicemail per DPR in reference to upcoming appointment scheduled on 12/27/16 with detailed instructions given per Myocardial Perfusion Study Information Sheet for the test. LM to arrive 15 minutes early, and that it is imperative to arrive on time for appointment to keep from having the test rescheduled. If you need to cancel or reschedule your appointment, please call the office within 24 hours of your appointment. Failure to do so may result in a cancellation of your appointment, and a $50 no show fee. Phone number given for call back for any questions. Kirstie Peri

## 2016-12-27 ENCOUNTER — Ambulatory Visit (HOSPITAL_COMMUNITY): Payer: Medicare Other | Attending: Cardiovascular Disease

## 2016-12-27 ENCOUNTER — Other Ambulatory Visit (HOSPITAL_COMMUNITY): Payer: Medicare Other

## 2016-12-27 DIAGNOSIS — I5032 Chronic diastolic (congestive) heart failure: Secondary | ICD-10-CM | POA: Diagnosis not present

## 2016-12-27 DIAGNOSIS — R931 Abnormal findings on diagnostic imaging of heart and coronary circulation: Secondary | ICD-10-CM

## 2016-12-27 MED ORDER — TECHNETIUM TC 99M TETROFOSMIN IV KIT
33.0000 | PACK | Freq: Once | INTRAVENOUS | Status: AC | PRN
  Administered 2016-12-27: 33 via INTRAVENOUS
  Filled 2016-12-27: qty 33

## 2016-12-27 MED ORDER — REGADENOSON 0.4 MG/5ML IV SOLN
0.4000 mg | Freq: Once | INTRAVENOUS | Status: AC
  Administered 2016-12-27: 0.4 mg via INTRAVENOUS

## 2016-12-28 ENCOUNTER — Ambulatory Visit (HOSPITAL_COMMUNITY): Payer: Medicare Other | Attending: Cardiology

## 2016-12-28 LAB — MYOCARDIAL PERFUSION IMAGING
LV dias vol: 107 mL (ref 46–106)
LV sys vol: 38 mL
Peak HR: 92 {beats}/min
RATE: 0.3
Rest HR: 74 {beats}/min
SDS: 2
SRS: 1
SSS: 3
TID: 1.11

## 2016-12-28 MED ORDER — TECHNETIUM TC 99M TETROFOSMIN IV KIT
30.6000 | PACK | Freq: Once | INTRAVENOUS | Status: AC | PRN
  Administered 2016-12-28: 30.6 via INTRAVENOUS
  Filled 2016-12-28: qty 31

## 2016-12-29 ENCOUNTER — Encounter (HOSPITAL_COMMUNITY): Payer: Medicare Other

## 2016-12-29 ENCOUNTER — Ambulatory Visit (HOSPITAL_COMMUNITY)
Admission: RE | Admit: 2016-12-29 | Discharge: 2016-12-29 | Disposition: A | Discharge status: Home / Self Care | Payer: Medicare Other | Source: Ambulatory Visit | Attending: Cardiovascular Disease | Admitting: Cardiovascular Disease

## 2016-12-29 DIAGNOSIS — M79604 Pain in right leg: Secondary | ICD-10-CM | POA: Insufficient documentation

## 2016-12-29 DIAGNOSIS — R600 Localized edema: Secondary | ICD-10-CM

## 2016-12-29 DIAGNOSIS — M79605 Pain in left leg: Secondary | ICD-10-CM | POA: Insufficient documentation

## 2016-12-30 ENCOUNTER — Telehealth: Payer: Self-pay | Admitting: Cardiovascular Disease

## 2016-12-30 NOTE — Telephone Encounter (Signed)
New Message  Pt call requesting to speak with RN. Pt states she received a call. Please call back to discuss

## 2017-01-04 DIAGNOSIS — Z6839 Body mass index (BMI) 39.0-39.9, adult: Secondary | ICD-10-CM | POA: Diagnosis not present

## 2017-01-04 DIAGNOSIS — N2581 Secondary hyperparathyroidism of renal origin: Secondary | ICD-10-CM | POA: Diagnosis not present

## 2017-01-04 DIAGNOSIS — I129 Hypertensive chronic kidney disease with stage 1 through stage 4 chronic kidney disease, or unspecified chronic kidney disease: Secondary | ICD-10-CM | POA: Diagnosis not present

## 2017-01-04 DIAGNOSIS — N184 Chronic kidney disease, stage 4 (severe): Secondary | ICD-10-CM | POA: Diagnosis not present

## 2017-01-04 DIAGNOSIS — D631 Anemia in chronic kidney disease: Secondary | ICD-10-CM | POA: Diagnosis not present

## 2017-01-24 ENCOUNTER — Ambulatory Visit: Payer: Medicare Other | Admitting: Cardiovascular Disease

## 2017-02-01 ENCOUNTER — Telehealth: Payer: Self-pay | Admitting: Cardiovascular Disease

## 2017-02-01 NOTE — Telephone Encounter (Signed)
New message    Pt is calling.   Pt c/o medication issue:  1. Name of Medication: amlodipine   2. How are you currently taking this medication (dosage and times per day)? Once daily  3. Are you having a reaction (difficulty breathing--STAT)? no  4. What is your medication issue? Pt states this medication is causing her legs to swell. She said it is also taking all her energy.

## 2017-02-02 NOTE — Telephone Encounter (Signed)
Spoke with patient and she c/o worsening LE edema since amlodipine dose increased to 10 mg daily on 11/30/16. Patient said she held amlodipine for one week and the swelling improved a lot. Patient restarted amlodipine 5 mg daily on 01/29/17 and has been monitoring her blood pressures. Patient said her average SBP is around 140-150 and her average DBP is 66-69. Patient stated that her edema is still much better than it was before the dose increase of amlodipine. No c/o chest pain, dizziness or sob. Patient admitted that she has been adding salt to her cucumbers. Patient advised to refrain from the sodium, continue wearing her compression stockings and continue to monitor blood pressures. Patient advised that her provider would be notified with these changes.

## 2017-02-02 NOTE — Telephone Encounter (Signed)
Follow up    Pt is calling to follow up about this

## 2017-02-10 DIAGNOSIS — M47816 Spondylosis without myelopathy or radiculopathy, lumbar region: Secondary | ICD-10-CM | POA: Diagnosis not present

## 2017-02-10 DIAGNOSIS — M4722 Other spondylosis with radiculopathy, cervical region: Secondary | ICD-10-CM | POA: Diagnosis not present

## 2017-02-12 NOTE — Telephone Encounter (Signed)
Ok to stay on lower dose 5 mg of norvasc for now and monitor BP

## 2017-02-14 MED ORDER — AMLODIPINE BESYLATE 10 MG PO TABS: 5.0000 mg | ORAL_TABLET | Freq: Every day | ORAL | 1 refills | Status: DC

## 2017-02-14 NOTE — Addendum Note (Signed)
Addended by: Aris Georgia, Rodrigus Kilker L on: 02/14/2017 12:57 PM   Modules accepted: Orders

## 2017-02-14 NOTE — Telephone Encounter (Signed)
Called patient back with Dr. Kyla Balzarine recommendations. Patient verbalized understanding and will bring BP reading to her appt on 03/07/17.

## 2017-03-07 ENCOUNTER — Ambulatory Visit: Payer: Medicare Other | Admitting: Cardiovascular Disease

## 2017-03-10 ENCOUNTER — Other Ambulatory Visit: Payer: Self-pay | Admitting: Family Medicine

## 2017-03-10 DIAGNOSIS — Z1231 Encounter for screening mammogram for malignant neoplasm of breast: Secondary | ICD-10-CM

## 2017-03-20 ENCOUNTER — Other Ambulatory Visit: Payer: Self-pay | Admitting: Physician Assistant

## 2017-03-29 DIAGNOSIS — D631 Anemia in chronic kidney disease: Secondary | ICD-10-CM | POA: Diagnosis not present

## 2017-03-29 DIAGNOSIS — N2581 Secondary hyperparathyroidism of renal origin: Secondary | ICD-10-CM | POA: Diagnosis not present

## 2017-03-29 DIAGNOSIS — N184 Chronic kidney disease, stage 4 (severe): Secondary | ICD-10-CM | POA: Diagnosis not present

## 2017-04-05 DIAGNOSIS — H2513 Age-related nuclear cataract, bilateral: Secondary | ICD-10-CM | POA: Diagnosis not present

## 2017-04-05 DIAGNOSIS — H5203 Hypermetropia, bilateral: Secondary | ICD-10-CM | POA: Diagnosis not present

## 2017-04-07 DIAGNOSIS — N184 Chronic kidney disease, stage 4 (severe): Secondary | ICD-10-CM | POA: Diagnosis not present

## 2017-04-07 DIAGNOSIS — I129 Hypertensive chronic kidney disease with stage 1 through stage 4 chronic kidney disease, or unspecified chronic kidney disease: Secondary | ICD-10-CM | POA: Diagnosis not present

## 2017-04-07 DIAGNOSIS — D631 Anemia in chronic kidney disease: Secondary | ICD-10-CM | POA: Diagnosis not present

## 2017-04-07 DIAGNOSIS — N2581 Secondary hyperparathyroidism of renal origin: Secondary | ICD-10-CM | POA: Diagnosis not present

## 2017-04-07 DIAGNOSIS — Z6839 Body mass index (BMI) 39.0-39.9, adult: Secondary | ICD-10-CM | POA: Diagnosis not present

## 2017-04-08 NOTE — Progress Notes (Signed)
Cardiology Office Note    Date:  04/11/2017   ID:  KAROL SKARZYNSKI, DOB 09-28-1932, MRN [id number]  PCP:  [name], MD  Cardiologist:  Dr. Johnsie Cancel  CC: follow up - diastolic CHF    HPI:  83 y.o. new to me. Previously seen by PA at request of Dr Marisue Humble. History of HTN, HLD , DM, CDK and morbid obesity ? Diastolic dysfunction. She has had worsening dyspnea and renal failure. Has had elevated CR with both ACE/ARB Echo reviewed from 11/18/16 EF 68-34% grade 2 diastolic dysfunction with estimated PA of 48 mmHg. ? 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 CKD (chronic kidney disease)   . Diabetes mellitus without complication ([place])    type II  . GERD (gastroesophageal reflux disease)   . High cholesterol   . Hypertension   . Osteoarthritis   . Skin cancer, basal cell     Past Surgical History:  Procedure Laterality Date  . ABDOMINAL HYSTERECTOMY N/A   . APPENDECTOMY N/A 1953  . BACK SURGERY    . CHOLECYSTECTOMY    . COLONOSCOPY N/A 02/22/2008  . TOTAL KNEE ARTHROPLASTY Right   . UMBILICAL HERNIA REPAIR N/A 04/02/2015   Procedure: LAPAROSCOPIC UMBILICAL HERNIA REPAIR WITH VENTRALIGHT MESH;  Surgeon: Ralene Ok, MD;  Location: [facility];  Service: General;  Laterality: N/A;    Current Medications: Outpatient Medications Prior to Visit  Medication Sig Dispense Refill  . aspirin EC 81 MG tablet Take 81 mg by mouth every other day.     . Cholecalciferol (VITAMIN D3) 2000 UNITS capsule Take 2,000 Units by mouth daily.    . cyclobenzaprine (FLEXERIL) 10 MG tablet Take 5 mg by  mouth 3 (three) times daily as needed for muscle spasms.     Marland Kitchen doxazosin (CARDURA) 2 MG tablet Take 2 mg by mouth daily.  3  . fenofibrate 160 MG tablet Take 160 mg by mouth every evening.     . furosemide (LASIX) 40 MG tablet Take 1 tablet (40 mg total) by mouth daily. 30 tablet 3  . HYDROcodone-acetaminophen (NORCO/VICODIN) 5-325 MG tablet Take 1 tablet by mouth daily.    . metoprolol succinate (TOPROL-XL) 100 MG 24 hr tablet Take 100 mg by mouth every evening. Take with or immediately following a meal.    . amLODipine (NORVASC) 10 MG tablet Take 0.5 tablets (5 mg total) by mouth daily. 90 tablet 1  . furosemide (LASIX) 40 MG tablet TAKE 1.5 TABLETS (60 MG TOTAL) BY MOUTH DAILY. (Patient not taking: Reported on 04/11/2017) 45 tablet 3   No facility-administered medications prior to visit.      Allergies:   Other; Penicillins; Lovastatin; Lisinopril; Neomycin sulfate [neomycin]; Niaspan [niacin]; Valsartan; Vytorin [ezetimibe-simvastatin]; and Zetia [ezetimibe]   Social History   Social History  . Marital status: Married    Spouse name: N/A  . Number of children: N/A  . Years of education: N/A   Social History Main Topics  . Smoking status: Never Smoker  . Smokeless tobacco: Never Used  .  Alcohol use Yes     Comment: twice a year  . Drug use: No  . Sexual activity: Not Asked   Other Topics Concern  . None   Social History Narrative  . None     Family History:  The patient's family history includes Breast cancer in her sister; Heart attack in her father; Heart failure in her mother; Hypertension in her mother; Prostate cancer in her father.     ROS:   Please see the history of present illness.    ROS All other systems reviewed and are negative.   PHYSICAL EXAM:   VS:  BP (!) 160/50   Pulse 72   Ht 5\' 3"  (1.6 m)   Wt 234 lb (106.1 kg)   SpO2 97%   BMI 41.45 kg/m     Affect appropriate Obese female  HEENT: normal Neck supple with no adenopathy JVP normal no  bruits no thyromegaly Lungs clear with no wheezing and good diaphragmatic motion Heart:  S1/S2 no murmur, no rub, gallop or click PMI normal Abdomen: benighn, BS positve, no tenderness, no AAA no bruit.  No HSM or HJR Distal pulses intact with no bruits Plus 2 bilateral  edema Neuro non-focal Skin warm and dry No muscular weakness   Wt Readings from Last 3 Encounters:  04/11/17 234 lb (106.1 kg)  12/27/16 233 lb (105.7 kg)  12/09/16 233 lb 1.9 oz (105.7 kg)      Studies/Labs Reviewed:   EKG:     04/02/17 SR PaC;s LVH with strain   Recent Labs: 11/30/2016: NT-Pro BNP 1,252 12/09/2016: BUN 51; Creatinine, Ser 2.21; Potassium 4.1; Sodium 142   Lipid Panel No results found for: CHOL, TRIG, HDL, CHOLHDL, VLDL, LDLCALC, LDLDIRECT  Additional studies/ records that were reviewed today include:  2D ECHO: 11/18/2016 LV EF: 60% - 65% Study Conclusions - Left ventricle: The cavity size was normal. There appeared to asymmetric hypertrophy of the LV apex. Systolic function was normal. The estimated ejection fraction was in the range of 60% to 65%. Wall motion was normal; there were no regional wall motion abnormalities. Features are consistent with a pseudonormal left ventricular filling pattern, with concomitant abnormal relaxation and increased filling pressure (grade 2 diastolic dysfunction). - Aortic valve: There was no stenosis. - Mitral valve: Mildly calcified annulus. There was trivial regurgitation. - Left atrium: The atrium was moderately dilated. - Right ventricle: The cavity size was normal. Systolic function was normal. - Tricuspid valve: Peak RV-RA gradient (S): 45 mm Hg. - Pulmonary arteries: PA peak pressure: 48 mm Hg (S). - Inferior vena cava: The vessel was normal in size. The respirophasic diameter changes were in the normal range (= 50%), consistent with normal central venous pressure. Impressions: - Normal LV size with asymmetric  hypertrophy of the LV apex (spade-shaped ventricle). This is suggestive of apical hypertrophic cardiomyopathy. EF 60-65%. Normal RV size and systolic function. No significant valvular abnormalities. Mild pulmonary hypertension.    ASSESSMENT & PLAN:   Acute on chronic diastolic CHF:  Not clear that her edema has anything to due with heart Needs Better BP control  Dyspnea:  Continue diuretics clear lungs Discussed weight loss and better HTN control   Abnormal ECHO:   HTN:  Stop norvasc and start hydralazine 25 tid. F/u home readings   HLD: previously did not tolerate Lovastatin. On Co Q 10 and lipids have been moderately well controlled.  DMT2: HgA1c 6.1 most recently. Not on any medications for his  CKD: now followed  by Dr. Posey Pronto. [place] no RAS  Creat increased from 1.88--> 2.23  Lasix lowered to 40 mg daily would be careful using aldactone in setting of renal failure     Jenkins Rouge

## 2017-04-11 ENCOUNTER — Ambulatory Visit (INDEPENDENT_AMBULATORY_CARE_PROVIDER_SITE_OTHER): Payer: Medicare Other | Admitting: Cardiovascular Disease

## 2017-04-11 ENCOUNTER — Encounter: Payer: Self-pay | Admitting: Cardiovascular Disease

## 2017-04-11 VITALS — BP 160/50 | HR 72 | Ht 63.0 in | Wt 234.0 lb

## 2017-04-11 DIAGNOSIS — R600 Localized edema: Secondary | ICD-10-CM | POA: Diagnosis not present

## 2017-04-11 MED ORDER — HYDRALAZINE HCL 25 MG PO TABS: 25.0000 mg | ORAL_TABLET | Freq: Three times a day (TID) | ORAL | 6 refills | Status: DC

## 2017-04-11 NOTE — Patient Instructions (Addendum)
Medication Instructions:  Your physician has recommended you make the following change in your medication:  1-STOP  Norvasc 2-START Hydralazine 25 mg by mouth three times daily.   Labwork: NONE  Testing/Procedures: NONE  Follow-Up: Your physician wants you to follow-up next available with Dr. Johnsie Cancel.    If you need a refill on your cardiac medications before your next appointment, please call your pharmacy.

## 2017-04-18 ENCOUNTER — Ambulatory Visit
Admission: RE | Admit: 2017-04-18 | Discharge: 2017-04-18 | Disposition: A | Discharge status: Home / Self Care | Payer: Medicare Other | Source: Ambulatory Visit | Attending: Family Medicine | Admitting: Family Medicine

## 2017-04-18 DIAGNOSIS — Z1231 Encounter for screening mammogram for malignant neoplasm of breast: Secondary | ICD-10-CM

## 2017-04-21 DIAGNOSIS — Z23 Encounter for immunization: Secondary | ICD-10-CM | POA: Diagnosis not present

## 2017-05-06 DIAGNOSIS — R9341 Abnormal radiologic findings on diagnostic imaging of renal pelvis, ureter, or bladder: Secondary | ICD-10-CM | POA: Diagnosis not present

## 2017-05-06 DIAGNOSIS — E1121 Type 2 diabetes mellitus with diabetic nephropathy: Secondary | ICD-10-CM | POA: Diagnosis not present

## 2017-05-06 DIAGNOSIS — D692 Other nonthrombocytopenic purpura: Secondary | ICD-10-CM | POA: Diagnosis not present

## 2017-05-06 DIAGNOSIS — I130 Hypertensive heart and chronic kidney disease with heart failure and stage 1 through stage 4 chronic kidney disease, or unspecified chronic kidney disease: Secondary | ICD-10-CM | POA: Diagnosis not present

## 2017-05-06 DIAGNOSIS — R609 Edema, unspecified: Secondary | ICD-10-CM | POA: Diagnosis not present

## 2017-05-06 DIAGNOSIS — E782 Mixed hyperlipidemia: Secondary | ICD-10-CM | POA: Diagnosis not present

## 2017-05-06 DIAGNOSIS — I5032 Chronic diastolic (congestive) heart failure: Secondary | ICD-10-CM | POA: Diagnosis not present

## 2017-05-06 DIAGNOSIS — N184 Chronic kidney disease, stage 4 (severe): Secondary | ICD-10-CM | POA: Diagnosis not present

## 2017-05-17 NOTE — Progress Notes (Signed)
Cardiology Office Note    Date:  05/18/2017   ID:  Tabitha Black, DOB Dec 01, 1932, MRN [id number]  PCP:  [name], MD  Cardiologist:  Dr. Johnsie Cancel  CC: follow up - diastolic CHF    HPI:  83 y.o. new to me. Previously seen by PA at request of Dr Marisue Humble. History of HTN, HLD , DM, CDK and morbid obesity ? Diastolic dysfunction. She has had worsening dyspnea and renal failure. Has had elevated CR with both ACE/ARB Echo reviewed from 11/18/16 EF 67-34% grade 2 diastolic dysfunction with estimated PA of 48 mmHg. ? 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 CKD (chronic kidney disease)   . Diabetes mellitus without complication ([place])    type II  . GERD (gastroesophageal reflux disease)   . High cholesterol   . Hypertension   . Osteoarthritis   . Skin cancer, basal cell     Past Surgical History:  Procedure Laterality Date  . ABDOMINAL HYSTERECTOMY N/A   . APPENDECTOMY N/A 1953  . BACK SURGERY    . CHOLECYSTECTOMY    . COLONOSCOPY N/A 02/22/2008  . TOTAL KNEE ARTHROPLASTY Right   . UMBILICAL HERNIA REPAIR N/A 04/02/2015   Procedure: LAPAROSCOPIC UMBILICAL HERNIA REPAIR WITH VENTRALIGHT MESH;  Surgeon: Ralene Ok, MD;  Location: [facility];  Service: General;  Laterality: N/A;    Current Medications: Outpatient Medications Prior to Visit  Medication Sig Dispense Refill  . aspirin EC 81 MG tablet Take 81 mg by mouth every other day.     . Cholecalciferol (VITAMIN D3) 2000 UNITS capsule Take 2,000 Units by mouth daily.    . cyclobenzaprine (FLEXERIL) 10 MG tablet Take 5 mg by mouth 3  (three) times daily as needed for muscle spasms.     Marland Kitchen doxazosin (CARDURA) 2 MG tablet Take 2 mg by mouth daily.  3  . fenofibrate 160 MG tablet Take 160 mg by mouth every evening.     . furosemide (LASIX) 40 MG tablet Take 1 tablet (40 mg total) by mouth daily. 30 tablet 3  . HYDROcodone-acetaminophen (NORCO/VICODIN) 5-325 MG tablet Take 1 tablet by mouth daily.    . metoprolol succinate (TOPROL-XL) 100 MG 24 hr tablet Take 100 mg by mouth every evening. Take with or immediately following a meal.    . hydrALAZINE (APRESOLINE) 25 MG tablet Take 1 tablet (25 mg total) by mouth 3 (three) times daily. 90 tablet 6  . spironolactone (ALDACTONE) 25 MG tablet Take 25 mg by mouth daily.     No facility-administered medications prior to visit.      Allergies:   Other; Penicillins; Lovastatin; Lisinopril; Neomycin sulfate [neomycin]; Niaspan [niacin]; Valsartan; Vytorin [ezetimibe-simvastatin]; and Zetia [ezetimibe]   Social History   Social History  . Marital status: Married    Spouse name: N/A  . Number of children: N/A  . Years of education: N/A   Social History Main Topics  . Smoking status: Never Smoker  . Smokeless tobacco: Never Used  . Alcohol use Yes  Comment: twice a year  . Drug use: No  . Sexual activity: Not Asked   Other Topics Concern  . None   Social History Narrative  . None     Family History:  The patient's family history includes Breast cancer in her sister; Heart attack in her father; Heart failure in her mother; Hypertension in her mother; Prostate cancer in her father.     ROS:   Please see the history of present illness.    ROS All other systems reviewed and are negative.   PHYSICAL EXAM:   VS:  BP (!) 160/82   Pulse 74   Ht 5\' 3"  (1.6 m)   Wt 229 lb 12.8 oz (104.2 kg)   SpO2 99%   BMI 40.71 kg/m     Affect appropriate Obese female  HEENT: normal Neck supple with no adenopathy JVP normal no bruits no thyromegaly Lungs clear with no wheezing  and good diaphragmatic motion Heart:  S1/S2 no murmur, no rub, gallop or click PMI normal Abdomen: benighn, BS positve, no tenderness, no AAA no bruit.  No HSM or HJR Distal pulses intact with no bruits Plus 2 bilateral  edema Neuro non-focal Skin warm and dry No muscular weakness   Wt Readings from Last 3 Encounters:  05/18/17 229 lb 12.8 oz (104.2 kg)  04/11/17 234 lb (106.1 kg)  12/27/16 233 lb (105.7 kg)      Studies/Labs Reviewed:   EKG:     04/02/17 SR PaC;s LVH with strain   Recent Labs: 11/30/2016: NT-Pro BNP 1,252 12/09/2016: BUN 51; Creatinine, Ser 2.21; Potassium 4.1; Sodium 142   Lipid Panel No results found for: CHOL, TRIG, HDL, CHOLHDL, VLDL, LDLCALC, LDLDIRECT  Additional studies/ records that were reviewed today include:  2D ECHO: 11/18/2016 LV EF: 60% - 65% Study Conclusions - Left ventricle: The cavity size was normal. There appeared to asymmetric hypertrophy of the LV apex. Systolic function was normal. The estimated ejection fraction was in the range of 60% to 65%. Wall motion was normal; there were no regional wall motion abnormalities. Features are consistent with a pseudonormal left ventricular filling pattern, with concomitant abnormal relaxation and increased filling pressure (grade 2 diastolic dysfunction). - Aortic valve: There was no stenosis. - Mitral valve: Mildly calcified annulus. There was trivial regurgitation. - Left atrium: The atrium was moderately dilated. - Right ventricle: The cavity size was normal. Systolic function was normal. - Tricuspid valve: Peak RV-RA gradient (S): 45 mm Hg. - Pulmonary arteries: PA peak pressure: 48 mm Hg (S). - Inferior vena cava: The vessel was normal in size. The respirophasic diameter changes were in the normal range (= 50%), consistent with normal central venous pressure. Impressions: - Normal LV size with asymmetric hypertrophy of the LV apex (spade-shaped ventricle).  This is suggestive of apical hypertrophic cardiomyopathy. EF 60-65%. Normal RV size and systolic function. No significant valvular abnormalities. Mild pulmonary hypertension.    ASSESSMENT & PLAN:   Acute on chronic diastolic CHF:  Not clear that her edema has anything to due with heart Needs Better BP control  Dyspnea:  Continue diuretics clear lungs Discussed weight loss and better HTN control   Abnormal ECHO: ? Apical HOCM  cannot have MRI with gadolinium due to CRF Only Rx Needed is better BP control and monitoring of renal failure  HTN:  Edema better off norvasc Increase hydralazine to 50 tid f/u Ehinger and Patel   HLD: previously did not tolerate Lovastatin. On Co Q 10 and lipids  have been moderately well controlled.  DMT2: HgA1c 6.1 most recently. Not on any medications for his  CKD: now followed by Dr. Posey Pronto. [place] no RAS  Creat increased from 1.88--> 2.23  Lasix lowered to 40 mg daily  Aldactone d/c     Jenkins Rouge

## 2017-05-18 ENCOUNTER — Ambulatory Visit (INDEPENDENT_AMBULATORY_CARE_PROVIDER_SITE_OTHER): Payer: Medicare Other | Admitting: Cardiovascular Disease

## 2017-05-18 ENCOUNTER — Encounter: Payer: Self-pay | Admitting: Cardiovascular Disease

## 2017-05-18 VITALS — BP 160/82 | HR 74 | Ht 63.0 in | Wt 229.8 lb

## 2017-05-18 DIAGNOSIS — R609 Edema, unspecified: Secondary | ICD-10-CM

## 2017-05-18 MED ORDER — HYDRALAZINE HCL 50 MG PO TABS: 50.0000 mg | ORAL_TABLET | Freq: Three times a day (TID) | ORAL | 3 refills | Status: DC

## 2017-05-18 NOTE — Patient Instructions (Addendum)
Medication Instructions:  Your physician has recommended you make the following change in your medication:  1-INCREASE Hydralazine 50 mg by mouth three times daily   Labwork: NONE  Testing/Procedures: NONE  Follow-Up: Your physician wants you to follow-up in: 12 months with Dr. Johnsie Cancel. You will receive a reminder letter in the mail two months in advance. If you don't receive a letter, please call our office to schedule the follow-up appointment.   If you need a refill on your cardiac medications before your next appointment, please call your pharmacy.

## 2017-05-31 ENCOUNTER — Telehealth: Payer: Self-pay | Admitting: Cardiovascular Disease

## 2017-05-31 MED ORDER — FUROSEMIDE 40 MG PO TABS: 40.0000 mg | ORAL_TABLET | Freq: Every day | ORAL | 3 refills | Status: DC

## 2017-05-31 NOTE — Telephone Encounter (Signed)
Sent in new order that shows the correct amount that patient is taking. Lasix 40 mg by mouth daily.

## 2017-05-31 NOTE — Telephone Encounter (Signed)
New Message  Pt c/o medication issue:  1. Name of Medication: Furosemide  2. How are you currently taking this medication (dosage and times per day)? 40mg    3. Are you having a reaction (difficulty breathing--STAT)? No   4. What is your medication issue? Per pt states she she would like to change the medication instructions from 1 1/2 tablets to 1 tablets. Pt states she is receiving to many pills after medication change. Please call back to discuss

## 2017-06-23 DIAGNOSIS — N2581 Secondary hyperparathyroidism of renal origin: Secondary | ICD-10-CM | POA: Diagnosis not present

## 2017-06-23 DIAGNOSIS — N184 Chronic kidney disease, stage 4 (severe): Secondary | ICD-10-CM | POA: Diagnosis not present

## 2017-06-29 DIAGNOSIS — I129 Hypertensive chronic kidney disease with stage 1 through stage 4 chronic kidney disease, or unspecified chronic kidney disease: Secondary | ICD-10-CM | POA: Diagnosis not present

## 2017-06-29 DIAGNOSIS — D631 Anemia in chronic kidney disease: Secondary | ICD-10-CM | POA: Diagnosis not present

## 2017-06-29 DIAGNOSIS — N2581 Secondary hyperparathyroidism of renal origin: Secondary | ICD-10-CM | POA: Diagnosis not present

## 2017-06-29 DIAGNOSIS — N184 Chronic kidney disease, stage 4 (severe): Secondary | ICD-10-CM | POA: Diagnosis not present

## 2017-07-26 DIAGNOSIS — N184 Chronic kidney disease, stage 4 (severe): Secondary | ICD-10-CM | POA: Diagnosis not present

## 2017-07-26 DIAGNOSIS — M4722 Other spondylosis with radiculopathy, cervical region: Secondary | ICD-10-CM | POA: Diagnosis not present

## 2017-07-26 DIAGNOSIS — M47816 Spondylosis without myelopathy or radiculopathy, lumbar region: Secondary | ICD-10-CM | POA: Diagnosis not present

## 2017-08-23 DIAGNOSIS — I5032 Chronic diastolic (congestive) heart failure: Secondary | ICD-10-CM | POA: Diagnosis not present

## 2017-08-23 DIAGNOSIS — I130 Hypertensive heart and chronic kidney disease with heart failure and stage 1 through stage 4 chronic kidney disease, or unspecified chronic kidney disease: Secondary | ICD-10-CM | POA: Diagnosis not present

## 2017-08-23 DIAGNOSIS — M1990 Unspecified osteoarthritis, unspecified site: Secondary | ICD-10-CM | POA: Diagnosis not present

## 2017-08-23 DIAGNOSIS — I129 Hypertensive chronic kidney disease with stage 1 through stage 4 chronic kidney disease, or unspecified chronic kidney disease: Secondary | ICD-10-CM | POA: Diagnosis not present

## 2017-08-23 DIAGNOSIS — N183 Chronic kidney disease, stage 3 (moderate): Secondary | ICD-10-CM | POA: Diagnosis not present

## 2017-08-23 DIAGNOSIS — E1122 Type 2 diabetes mellitus with diabetic chronic kidney disease: Secondary | ICD-10-CM | POA: Diagnosis not present

## 2017-08-23 DIAGNOSIS — E782 Mixed hyperlipidemia: Secondary | ICD-10-CM | POA: Diagnosis not present

## 2017-08-23 DIAGNOSIS — Z7984 Long term (current) use of oral hypoglycemic drugs: Secondary | ICD-10-CM | POA: Diagnosis not present

## 2017-09-13 DIAGNOSIS — N184 Chronic kidney disease, stage 4 (severe): Secondary | ICD-10-CM | POA: Diagnosis not present

## 2017-09-13 DIAGNOSIS — N2581 Secondary hyperparathyroidism of renal origin: Secondary | ICD-10-CM | POA: Diagnosis not present

## 2017-09-13 DIAGNOSIS — D631 Anemia in chronic kidney disease: Secondary | ICD-10-CM | POA: Diagnosis not present

## 2017-09-19 DIAGNOSIS — N189 Chronic kidney disease, unspecified: Secondary | ICD-10-CM | POA: Diagnosis not present

## 2017-09-19 DIAGNOSIS — N2581 Secondary hyperparathyroidism of renal origin: Secondary | ICD-10-CM | POA: Diagnosis not present

## 2017-09-19 DIAGNOSIS — I129 Hypertensive chronic kidney disease with stage 1 through stage 4 chronic kidney disease, or unspecified chronic kidney disease: Secondary | ICD-10-CM | POA: Diagnosis not present

## 2017-09-19 DIAGNOSIS — N184 Chronic kidney disease, stage 4 (severe): Secondary | ICD-10-CM | POA: Diagnosis not present

## 2017-09-19 DIAGNOSIS — D631 Anemia in chronic kidney disease: Secondary | ICD-10-CM | POA: Diagnosis not present

## 2017-11-07 DIAGNOSIS — R93429 Abnormal radiologic findings on diagnostic imaging of unspecified kidney: Secondary | ICD-10-CM | POA: Diagnosis not present

## 2017-11-07 DIAGNOSIS — E1121 Type 2 diabetes mellitus with diabetic nephropathy: Secondary | ICD-10-CM | POA: Diagnosis not present

## 2017-11-07 DIAGNOSIS — D692 Other nonthrombocytopenic purpura: Secondary | ICD-10-CM | POA: Diagnosis not present

## 2017-11-07 DIAGNOSIS — R609 Edema, unspecified: Secondary | ICD-10-CM | POA: Diagnosis not present

## 2017-11-07 DIAGNOSIS — I5032 Chronic diastolic (congestive) heart failure: Secondary | ICD-10-CM | POA: Diagnosis not present

## 2017-11-07 DIAGNOSIS — N184 Chronic kidney disease, stage 4 (severe): Secondary | ICD-10-CM | POA: Diagnosis not present

## 2017-11-07 DIAGNOSIS — E782 Mixed hyperlipidemia: Secondary | ICD-10-CM | POA: Diagnosis not present

## 2017-11-07 DIAGNOSIS — Z0000 Encounter for general adult medical examination without abnormal findings: Secondary | ICD-10-CM | POA: Diagnosis not present

## 2017-11-07 DIAGNOSIS — I130 Hypertensive heart and chronic kidney disease with heart failure and stage 1 through stage 4 chronic kidney disease, or unspecified chronic kidney disease: Secondary | ICD-10-CM | POA: Diagnosis not present

## 2018-01-23 DIAGNOSIS — N184 Chronic kidney disease, stage 4 (severe): Secondary | ICD-10-CM | POA: Diagnosis not present

## 2018-01-24 DIAGNOSIS — M4722 Other spondylosis with radiculopathy, cervical region: Secondary | ICD-10-CM | POA: Diagnosis not present

## 2018-01-30 DIAGNOSIS — N2581 Secondary hyperparathyroidism of renal origin: Secondary | ICD-10-CM | POA: Diagnosis not present

## 2018-01-30 DIAGNOSIS — N184 Chronic kidney disease, stage 4 (severe): Secondary | ICD-10-CM | POA: Diagnosis not present

## 2018-01-30 DIAGNOSIS — D631 Anemia in chronic kidney disease: Secondary | ICD-10-CM | POA: Diagnosis not present

## 2018-01-30 DIAGNOSIS — I129 Hypertensive chronic kidney disease with stage 1 through stage 4 chronic kidney disease, or unspecified chronic kidney disease: Secondary | ICD-10-CM | POA: Diagnosis not present

## 2018-01-30 DIAGNOSIS — N189 Chronic kidney disease, unspecified: Secondary | ICD-10-CM | POA: Diagnosis not present

## 2018-02-07 DIAGNOSIS — N184 Chronic kidney disease, stage 4 (severe): Secondary | ICD-10-CM | POA: Diagnosis not present

## 2018-02-07 DIAGNOSIS — M1990 Unspecified osteoarthritis, unspecified site: Secondary | ICD-10-CM | POA: Diagnosis not present

## 2018-02-07 DIAGNOSIS — E1122 Type 2 diabetes mellitus with diabetic chronic kidney disease: Secondary | ICD-10-CM | POA: Diagnosis not present

## 2018-02-07 DIAGNOSIS — E782 Mixed hyperlipidemia: Secondary | ICD-10-CM | POA: Diagnosis not present

## 2018-02-07 DIAGNOSIS — I5032 Chronic diastolic (congestive) heart failure: Secondary | ICD-10-CM | POA: Diagnosis not present

## 2018-02-07 DIAGNOSIS — E1121 Type 2 diabetes mellitus with diabetic nephropathy: Secondary | ICD-10-CM | POA: Diagnosis not present

## 2018-02-08 ENCOUNTER — Other Ambulatory Visit (HOSPITAL_COMMUNITY): Payer: Self-pay | Admitting: *Deleted

## 2018-02-09 ENCOUNTER — Ambulatory Visit (HOSPITAL_COMMUNITY)
Admission: RE | Admit: 2018-02-09 | Discharge: 2018-02-09 | Disposition: A | Discharge status: Home / Self Care | Payer: Medicare Other | Source: Ambulatory Visit | Attending: Nephrology | Admitting: Nephrology

## 2018-02-09 DIAGNOSIS — D631 Anemia in chronic kidney disease: Secondary | ICD-10-CM | POA: Diagnosis not present

## 2018-02-09 DIAGNOSIS — N189 Chronic kidney disease, unspecified: Secondary | ICD-10-CM | POA: Insufficient documentation

## 2018-02-09 MED ORDER — SODIUM CHLORIDE 0.9 % IV SOLN
510.0000 mg | INTRAVENOUS | Status: DC
  Administered 2018-02-09: 510 mg via INTRAVENOUS
  Filled 2018-02-09: qty 17

## 2018-02-09 NOTE — Discharge Instructions (Signed)

## 2018-02-16 ENCOUNTER — Ambulatory Visit (HOSPITAL_COMMUNITY)
Admission: RE | Admit: 2018-02-16 | Discharge: 2018-02-16 | Disposition: A | Discharge status: Home / Self Care | Payer: Medicare Other | Source: Ambulatory Visit | Attending: Nephrology | Admitting: Nephrology

## 2018-02-16 DIAGNOSIS — N189 Chronic kidney disease, unspecified: Secondary | ICD-10-CM | POA: Diagnosis not present

## 2018-02-16 DIAGNOSIS — D631 Anemia in chronic kidney disease: Secondary | ICD-10-CM | POA: Diagnosis not present

## 2018-02-16 MED ORDER — FERUMOXYTOL INJECTION 510 MG/17 ML
510.0000 mg | INTRAVENOUS | Status: AC
  Administered 2018-02-16: 510 mg via INTRAVENOUS
  Filled 2018-02-16: qty 17

## 2018-02-27 DIAGNOSIS — K219 Gastro-esophageal reflux disease without esophagitis: Secondary | ICD-10-CM | POA: Diagnosis not present

## 2018-02-27 DIAGNOSIS — N184 Chronic kidney disease, stage 4 (severe): Secondary | ICD-10-CM | POA: Diagnosis not present

## 2018-02-27 DIAGNOSIS — D509 Iron deficiency anemia, unspecified: Secondary | ICD-10-CM | POA: Diagnosis not present

## 2018-02-27 DIAGNOSIS — Z86010 Personal history of colonic polyps: Secondary | ICD-10-CM | POA: Diagnosis not present

## 2018-02-27 DIAGNOSIS — E1121 Type 2 diabetes mellitus with diabetic nephropathy: Secondary | ICD-10-CM | POA: Diagnosis not present

## 2018-02-28 DIAGNOSIS — Z7984 Long term (current) use of oral hypoglycemic drugs: Secondary | ICD-10-CM | POA: Diagnosis not present

## 2018-02-28 DIAGNOSIS — E782 Mixed hyperlipidemia: Secondary | ICD-10-CM | POA: Diagnosis not present

## 2018-02-28 DIAGNOSIS — D509 Iron deficiency anemia, unspecified: Secondary | ICD-10-CM | POA: Diagnosis not present

## 2018-02-28 DIAGNOSIS — I5032 Chronic diastolic (congestive) heart failure: Secondary | ICD-10-CM | POA: Diagnosis not present

## 2018-02-28 DIAGNOSIS — N184 Chronic kidney disease, stage 4 (severe): Secondary | ICD-10-CM | POA: Diagnosis not present

## 2018-02-28 DIAGNOSIS — E1122 Type 2 diabetes mellitus with diabetic chronic kidney disease: Secondary | ICD-10-CM | POA: Diagnosis not present

## 2018-02-28 DIAGNOSIS — M1990 Unspecified osteoarthritis, unspecified site: Secondary | ICD-10-CM | POA: Diagnosis not present

## 2018-03-17 ENCOUNTER — Other Ambulatory Visit: Payer: Self-pay | Admitting: Family Medicine

## 2018-03-17 DIAGNOSIS — Z1231 Encounter for screening mammogram for malignant neoplasm of breast: Secondary | ICD-10-CM

## 2018-04-05 DIAGNOSIS — D509 Iron deficiency anemia, unspecified: Secondary | ICD-10-CM | POA: Diagnosis not present

## 2018-04-05 DIAGNOSIS — I5032 Chronic diastolic (congestive) heart failure: Secondary | ICD-10-CM | POA: Diagnosis not present

## 2018-04-05 DIAGNOSIS — E1121 Type 2 diabetes mellitus with diabetic nephropathy: Secondary | ICD-10-CM | POA: Diagnosis not present

## 2018-04-05 DIAGNOSIS — Z86010 Personal history of colonic polyps: Secondary | ICD-10-CM | POA: Diagnosis not present

## 2018-04-05 DIAGNOSIS — N184 Chronic kidney disease, stage 4 (severe): Secondary | ICD-10-CM | POA: Diagnosis not present

## 2018-04-05 DIAGNOSIS — K219 Gastro-esophageal reflux disease without esophagitis: Secondary | ICD-10-CM | POA: Diagnosis not present

## 2018-04-06 DIAGNOSIS — E119 Type 2 diabetes mellitus without complications: Secondary | ICD-10-CM | POA: Diagnosis not present

## 2018-04-06 DIAGNOSIS — H5203 Hypermetropia, bilateral: Secondary | ICD-10-CM | POA: Diagnosis not present

## 2018-04-06 DIAGNOSIS — H2513 Age-related nuclear cataract, bilateral: Secondary | ICD-10-CM | POA: Diagnosis not present

## 2018-04-06 DIAGNOSIS — H25013 Cortical age-related cataract, bilateral: Secondary | ICD-10-CM | POA: Diagnosis not present

## 2018-04-13 ENCOUNTER — Other Ambulatory Visit (HOSPITAL_COMMUNITY): Payer: Self-pay | Admitting: *Deleted

## 2018-04-14 ENCOUNTER — Ambulatory Visit (HOSPITAL_COMMUNITY)
Admission: RE | Admit: 2018-04-14 | Discharge: 2018-04-14 | Disposition: A | Discharge status: Home / Self Care | Payer: Medicare Other | Source: Ambulatory Visit | Attending: Nephrology | Admitting: Nephrology

## 2018-04-14 DIAGNOSIS — N189 Chronic kidney disease, unspecified: Secondary | ICD-10-CM | POA: Insufficient documentation

## 2018-04-14 DIAGNOSIS — D631 Anemia in chronic kidney disease: Secondary | ICD-10-CM | POA: Insufficient documentation

## 2018-04-14 MED ORDER — SODIUM CHLORIDE 0.9 % IV SOLN
510.0000 mg | INTRAVENOUS | Status: DC
  Administered 2018-04-14: 510 mg via INTRAVENOUS
  Filled 2018-04-14: qty 17

## 2018-04-20 ENCOUNTER — Encounter (HOSPITAL_COMMUNITY): Payer: Medicare Other

## 2018-04-21 ENCOUNTER — Ambulatory Visit (HOSPITAL_COMMUNITY)
Admission: RE | Admit: 2018-04-21 | Discharge: 2018-04-21 | Disposition: A | Discharge status: Home / Self Care | Payer: Medicare Other | Source: Ambulatory Visit | Attending: Gastroenterology | Admitting: Gastroenterology

## 2018-04-21 ENCOUNTER — Ambulatory Visit
Admission: RE | Admit: 2018-04-21 | Discharge: 2018-04-21 | Disposition: A | Discharge status: Home / Self Care | Payer: Medicare Other | Source: Ambulatory Visit | Attending: Family Medicine | Admitting: Family Medicine

## 2018-04-21 DIAGNOSIS — Z1231 Encounter for screening mammogram for malignant neoplasm of breast: Secondary | ICD-10-CM | POA: Diagnosis not present

## 2018-04-21 DIAGNOSIS — N189 Chronic kidney disease, unspecified: Secondary | ICD-10-CM | POA: Diagnosis not present

## 2018-04-21 DIAGNOSIS — D631 Anemia in chronic kidney disease: Secondary | ICD-10-CM | POA: Diagnosis not present

## 2018-04-21 MED ORDER — SODIUM CHLORIDE 0.9 % IV SOLN
510.0000 mg | INTRAVENOUS | Status: AC
  Administered 2018-04-21: 510 mg via INTRAVENOUS
  Filled 2018-04-21: qty 17

## 2018-04-25 DIAGNOSIS — M4722 Other spondylosis with radiculopathy, cervical region: Secondary | ICD-10-CM | POA: Diagnosis not present

## 2018-04-25 DIAGNOSIS — M47816 Spondylosis without myelopathy or radiculopathy, lumbar region: Secondary | ICD-10-CM | POA: Diagnosis not present

## 2018-04-28 DIAGNOSIS — Z23 Encounter for immunization: Secondary | ICD-10-CM | POA: Diagnosis not present

## 2018-05-03 ENCOUNTER — Other Ambulatory Visit: Payer: Self-pay | Admitting: Cardiovascular Disease

## 2018-05-03 DIAGNOSIS — D509 Iron deficiency anemia, unspecified: Secondary | ICD-10-CM | POA: Diagnosis not present

## 2018-05-09 DIAGNOSIS — M1990 Unspecified osteoarthritis, unspecified site: Secondary | ICD-10-CM | POA: Diagnosis not present

## 2018-05-09 DIAGNOSIS — R54 Age-related physical debility: Secondary | ICD-10-CM | POA: Diagnosis not present

## 2018-05-09 DIAGNOSIS — E1121 Type 2 diabetes mellitus with diabetic nephropathy: Secondary | ICD-10-CM | POA: Diagnosis not present

## 2018-05-09 DIAGNOSIS — N184 Chronic kidney disease, stage 4 (severe): Secondary | ICD-10-CM | POA: Diagnosis not present

## 2018-05-09 DIAGNOSIS — I130 Hypertensive heart and chronic kidney disease with heart failure and stage 1 through stage 4 chronic kidney disease, or unspecified chronic kidney disease: Secondary | ICD-10-CM | POA: Diagnosis not present

## 2018-05-09 DIAGNOSIS — D649 Anemia, unspecified: Secondary | ICD-10-CM | POA: Diagnosis not present

## 2018-05-09 DIAGNOSIS — D692 Other nonthrombocytopenic purpura: Secondary | ICD-10-CM | POA: Diagnosis not present

## 2018-05-09 DIAGNOSIS — E782 Mixed hyperlipidemia: Secondary | ICD-10-CM | POA: Diagnosis not present

## 2018-05-09 DIAGNOSIS — I5032 Chronic diastolic (congestive) heart failure: Secondary | ICD-10-CM | POA: Diagnosis not present

## 2018-05-09 DIAGNOSIS — R609 Edema, unspecified: Secondary | ICD-10-CM | POA: Diagnosis not present

## 2018-05-10 DIAGNOSIS — Z6839 Body mass index (BMI) 39.0-39.9, adult: Secondary | ICD-10-CM | POA: Diagnosis not present

## 2018-05-10 DIAGNOSIS — D509 Iron deficiency anemia, unspecified: Secondary | ICD-10-CM | POA: Diagnosis not present

## 2018-05-10 DIAGNOSIS — E1122 Type 2 diabetes mellitus with diabetic chronic kidney disease: Secondary | ICD-10-CM | POA: Diagnosis not present

## 2018-05-10 DIAGNOSIS — Z86010 Personal history of colonic polyps: Secondary | ICD-10-CM | POA: Diagnosis not present

## 2018-05-22 DIAGNOSIS — D631 Anemia in chronic kidney disease: Secondary | ICD-10-CM | POA: Diagnosis not present

## 2018-05-22 DIAGNOSIS — N184 Chronic kidney disease, stage 4 (severe): Secondary | ICD-10-CM | POA: Diagnosis not present

## 2018-05-22 DIAGNOSIS — I129 Hypertensive chronic kidney disease with stage 1 through stage 4 chronic kidney disease, or unspecified chronic kidney disease: Secondary | ICD-10-CM | POA: Diagnosis not present

## 2018-05-22 DIAGNOSIS — N2581 Secondary hyperparathyroidism of renal origin: Secondary | ICD-10-CM | POA: Diagnosis not present

## 2018-05-29 DIAGNOSIS — E782 Mixed hyperlipidemia: Secondary | ICD-10-CM | POA: Diagnosis not present

## 2018-05-29 DIAGNOSIS — N183 Chronic kidney disease, stage 3 (moderate): Secondary | ICD-10-CM | POA: Diagnosis not present

## 2018-05-29 DIAGNOSIS — R54 Age-related physical debility: Secondary | ICD-10-CM | POA: Diagnosis not present

## 2018-05-29 DIAGNOSIS — E1122 Type 2 diabetes mellitus with diabetic chronic kidney disease: Secondary | ICD-10-CM | POA: Diagnosis not present

## 2018-05-29 DIAGNOSIS — I130 Hypertensive heart and chronic kidney disease with heart failure and stage 1 through stage 4 chronic kidney disease, or unspecified chronic kidney disease: Secondary | ICD-10-CM | POA: Diagnosis not present

## 2018-05-29 DIAGNOSIS — M1990 Unspecified osteoarthritis, unspecified site: Secondary | ICD-10-CM | POA: Diagnosis not present

## 2018-05-29 DIAGNOSIS — D509 Iron deficiency anemia, unspecified: Secondary | ICD-10-CM | POA: Diagnosis not present

## 2018-05-29 DIAGNOSIS — I129 Hypertensive chronic kidney disease with stage 1 through stage 4 chronic kidney disease, or unspecified chronic kidney disease: Secondary | ICD-10-CM | POA: Diagnosis not present

## 2018-05-29 DIAGNOSIS — N184 Chronic kidney disease, stage 4 (severe): Secondary | ICD-10-CM | POA: Diagnosis not present

## 2018-05-29 DIAGNOSIS — E1121 Type 2 diabetes mellitus with diabetic nephropathy: Secondary | ICD-10-CM | POA: Diagnosis not present

## 2018-05-29 DIAGNOSIS — I5032 Chronic diastolic (congestive) heart failure: Secondary | ICD-10-CM | POA: Diagnosis not present

## 2018-06-19 NOTE — Progress Notes (Signed)
Cardiology Office Note    Date:  06/20/2018   ID:  Tabitha Black, DOB 05/06/1933, MRN [id number]  PCP:  [name], MD  Cardiologist:  Dr. Johnsie Cancel  CC: follow up - diastolic CHF    HPI:  84 y.o. new to me. Previously seen by PA at request of Dr Marisue Humble. History of HTN, HLD , DM, CDK and morbid obesity ? Diastolic dysfunction. She has had worsening dyspnea and renal failure. Has had elevated CR with both ACE/ARB Echo reviewed from 11/18/16 EF 16-01% grade 2 diastolic dysfunction with estimated PA of 48 mmHg. ? 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  She had more LE edema when norvasc increased from 5-> 10 mg so stopped   She has had BNP of  1252 and elevated d dimer with V/Q negative for PE 12/06/16   Myovue 12/28/16 normal EF 65% US kidneys only consistent with medical / renal disease   She has had multiple fer heme injections for anemia    Past Medical History:  Diagnosis Date  . CKD (chronic kidney disease)   . Diabetes mellitus without complication ([place])    type II  . GERD (gastroesophageal reflux disease)   . High cholesterol   . Hypertension   . Osteoarthritis   . Skin cancer, basal cell     Past Surgical History:  Procedure Laterality Date  . ABDOMINAL HYSTERECTOMY N/A   . APPENDECTOMY N/A 1953  . BACK SURGERY    . CHOLECYSTECTOMY    . COLONOSCOPY N/A 02/22/2008  . TOTAL KNEE ARTHROPLASTY Right   . UMBILICAL HERNIA REPAIR N/A 04/02/2015   Procedure: LAPAROSCOPIC UMBILICAL HERNIA REPAIR WITH VENTRALIGHT MESH;  Surgeon: Ralene Ok, MD;  Location: [facility];  Service: General;  Laterality: N/A;    Current Medications: Outpatient Medications Prior to Visit  Medication Sig Dispense Refill  . aspirin EC 81 MG tablet Take 81 mg by mouth every other day.     . Cholecalciferol (VITAMIN D3) 1.25 MG (50000 UT) TABS Take 1 tablet by mouth daily.    . cyclobenzaprine (FLEXERIL) 10 MG tablet Take 5 mg by mouth 3 (three) times daily as needed for muscle  spasms.     Marland Kitchen doxazosin (CARDURA) 2 MG tablet Take 2 mg by mouth daily.  3  . fenofibrate 160 MG tablet Take 160 mg by mouth every evening.     . furosemide (LASIX) 40 MG tablet Take 1 tablet (40 mg total) daily by mouth. 90 tablet 3  . hydrALAZINE (APRESOLINE) 50 MG tablet TAKE 1 TABLET BY MOUTH THREE TIMES A DAY 270 tablet 0  . HYDROcodone-acetaminophen (NORCO/VICODIN) 5-325 MG tablet Take 1 tablet by mouth daily.    . metoprolol succinate (TOPROL-XL) 100 MG 24 hr tablet Take 100 mg by mouth every evening. Take with or immediately following a meal.    . triamterene-hydrochlorothiazide (MAXZIDE) 75-50 MG tablet Take 1 tablet by mouth daily.    . Cholecalciferol (VITAMIN D3) 2000 UNITS capsule Take 2,000 Units by mouth daily.     No facility-administered medications prior to visit.      Allergies:   Other; Penicillins; Lovastatin; Lisinopril; Neomycin sulfate [neomycin]; Niaspan [niacin]; Valsartan; Vytorin [ezetimibe-simvastatin]; and Zetia [ezetimibe]   Social History   Socioeconomic History  . Marital status: Married    Spouse name: Not on file  . Number of children: Not on file  . Years of education: Not on file  . Highest education level: Not on file  Occupational History  .  Not on file  Social Needs  . Financial resource strain: Not on file  . Food insecurity:    Worry: Not on file    Inability: Not on file  . Transportation needs:    Medical: Not on file    Non-medical: Not on file  Tobacco Use  . Smoking status: Never Smoker  . Smokeless tobacco: Never Used  Substance and Sexual Activity  . Alcohol use: Yes    Comment: twice a year  . Drug use: No  . Sexual activity: Not on file  Lifestyle  . Physical activity:    Days per week: Not on file    Minutes per session: Not on file  . Stress: Not on file  Relationships  . Social connections:    Talks on phone: Not on file    Gets together: Not on file    Attends religious service: Not on file    Active member of  club or organization: Not on file    Attends meetings of clubs or organizations: Not on file    Relationship status: Not on file  Other Topics Concern  . Not on file  Social History Narrative  . Not on file     Family History:  The patient's family history includes Breast cancer in her sister; Heart attack in her father; Heart failure in her mother; Hypertension in her mother; Prostate cancer in her father.     ROS:   Please see the history of present illness.    ROS All other systems reviewed and are negative.   PHYSICAL EXAM:   VS:  BP 140/80   Pulse 72   Ht 5' 3.5" (1.613 m)   Wt 223 lb 6.4 oz (101.3 kg)   SpO2 97%   BMI 38.95 kg/m     Affect appropriate Obese female  HEENT: normal Neck supple with no adenopathy JVP normal no bruits no thyromegaly Lungs clear with no wheezing and good diaphragmatic motion Heart:  S1/S2 no murmur, no rub, gallop or click PMI normal Abdomen: benighn, BS positve, no tenderness, no AAA no bruit.  No HSM or HJR Distal pulses intact with no bruits Plus 2 bilateral edema Neuro non-focal Skin warm and dry No muscular weakness    Wt Readings from Last 3 Encounters:  06/20/18 223 lb 6.4 oz (101.3 kg)  04/21/18 218 lb (98.9 kg)  04/14/18 218 lb (98.9 kg)      Studies/Labs Reviewed:   EKG:     04/02/17 SR PaC;s LVH with strain  06/20/18 SR LVH with strain   Recent Labs: No results found for requested labs within last 8760 hours.   Lipid Panel No results found for: CHOL, TRIG, HDL, CHOLHDL, VLDL, LDLCALC, LDLDIRECT  Additional studies/ records that were reviewed today include:  2D ECHO: 11/18/2016 LV EF: 60% - 65% Study Conclusions - Left ventricle: The cavity size was normal. There appeared to asymmetric hypertrophy of the LV apex. Systolic function was normal. The estimated ejection fraction was in the range of 60% to 65%. Wall motion was normal; there were no regional wall motion abnormalities. Features are  consistent with a pseudonormal left ventricular filling pattern, with concomitant abnormal relaxation and increased filling pressure (grade 2 diastolic dysfunction). - Aortic valve: There was no stenosis. - Mitral valve: Mildly calcified annulus. There was trivial regurgitation. - Left atrium: The atrium was moderately dilated. - Right ventricle: The cavity size was normal. Systolic function was normal. - Tricuspid valve: Peak RV-RA gradient (S): 45  mm Hg. - Pulmonary arteries: PA peak pressure: 48 mm Hg (S). - Inferior vena cava: The vessel was normal in size. The respirophasic diameter changes were in the normal range (= 50%), consistent with normal central venous pressure. Impressions: - Normal LV size with asymmetric hypertrophy of the LV apex (spade-shaped ventricle). This is suggestive of apical hypertrophic cardiomyopathy. EF 60-65%. Normal RV size and systolic function. No significant valvular abnormalities. Mild pulmonary hypertension.    ASSESSMENT & PLAN:   Acute on chronic diastolic CHF:  Not clear that her edema has anything to due with heart Needs Better BP control low sodium diet continue diuretic   Dyspnea:  Continue diuretics clear lungs Discussed weight loss and better HTN control   Abnormal ECHO: ? Apical HOCM  cannot have MRI with gadolinium due to CRF Only Rx Needed is better BP control and monitoring of renal failure  HTN:  Improved with hydralazine unable to use ACE/ARB/Amlodipoine   HLD: previously did not tolerate Lovastatin. On Co Q 10 and lipids have been moderately well controlled.  DMT2: HgA1c 6.1 most recently. Not on any medications for his  CKD: now followed by Dr. Posey Pronto. [place] no RAS  Baseline Cr around 2.5  Lasix lowered to 40 mg daily  Aldactone d/c Anemia requiring iron injections     Jenkins Rouge

## 2018-06-20 ENCOUNTER — Ambulatory Visit (INDEPENDENT_AMBULATORY_CARE_PROVIDER_SITE_OTHER): Payer: Medicare Other | Admitting: Cardiovascular Disease

## 2018-06-20 ENCOUNTER — Encounter: Payer: Self-pay | Admitting: Cardiovascular Disease

## 2018-06-20 VITALS — BP 140/80 | HR 72 | Ht 63.5 in | Wt 223.4 lb

## 2018-06-20 DIAGNOSIS — R931 Abnormal findings on diagnostic imaging of heart and coronary circulation: Secondary | ICD-10-CM

## 2018-06-20 DIAGNOSIS — I5032 Chronic diastolic (congestive) heart failure: Secondary | ICD-10-CM | POA: Diagnosis not present

## 2018-06-20 DIAGNOSIS — R0600 Dyspnea, unspecified: Secondary | ICD-10-CM | POA: Diagnosis not present

## 2018-06-20 DIAGNOSIS — I10 Essential (primary) hypertension: Secondary | ICD-10-CM

## 2018-06-20 NOTE — Patient Instructions (Signed)

## 2018-06-28 ENCOUNTER — Other Ambulatory Visit: Payer: Self-pay | Admitting: Cardiovascular Disease

## 2018-07-31 ENCOUNTER — Other Ambulatory Visit: Payer: Self-pay | Admitting: Cardiovascular Disease

## 2018-08-10 DIAGNOSIS — E1121 Type 2 diabetes mellitus with diabetic nephropathy: Secondary | ICD-10-CM | POA: Diagnosis not present

## 2018-08-10 DIAGNOSIS — I130 Hypertensive heart and chronic kidney disease with heart failure and stage 1 through stage 4 chronic kidney disease, or unspecified chronic kidney disease: Secondary | ICD-10-CM | POA: Diagnosis not present

## 2018-08-10 DIAGNOSIS — M1990 Unspecified osteoarthritis, unspecified site: Secondary | ICD-10-CM | POA: Diagnosis not present

## 2018-08-10 DIAGNOSIS — R54 Age-related physical debility: Secondary | ICD-10-CM | POA: Diagnosis not present

## 2018-08-10 DIAGNOSIS — D509 Iron deficiency anemia, unspecified: Secondary | ICD-10-CM | POA: Diagnosis not present

## 2018-08-10 DIAGNOSIS — E1122 Type 2 diabetes mellitus with diabetic chronic kidney disease: Secondary | ICD-10-CM | POA: Diagnosis not present

## 2018-08-10 DIAGNOSIS — N184 Chronic kidney disease, stage 4 (severe): Secondary | ICD-10-CM | POA: Diagnosis not present

## 2018-08-10 DIAGNOSIS — I5032 Chronic diastolic (congestive) heart failure: Secondary | ICD-10-CM | POA: Diagnosis not present

## 2018-08-10 DIAGNOSIS — I129 Hypertensive chronic kidney disease with stage 1 through stage 4 chronic kidney disease, or unspecified chronic kidney disease: Secondary | ICD-10-CM | POA: Diagnosis not present

## 2018-08-10 DIAGNOSIS — E782 Mixed hyperlipidemia: Secondary | ICD-10-CM | POA: Diagnosis not present

## 2018-09-11 DIAGNOSIS — N184 Chronic kidney disease, stage 4 (severe): Secondary | ICD-10-CM | POA: Diagnosis not present

## 2018-09-11 DIAGNOSIS — L720 Epidermal cyst: Secondary | ICD-10-CM | POA: Diagnosis not present

## 2018-09-19 DIAGNOSIS — N184 Chronic kidney disease, stage 4 (severe): Secondary | ICD-10-CM | POA: Diagnosis not present

## 2018-09-19 DIAGNOSIS — L720 Epidermal cyst: Secondary | ICD-10-CM | POA: Diagnosis not present

## 2018-09-19 DIAGNOSIS — N2581 Secondary hyperparathyroidism of renal origin: Secondary | ICD-10-CM | POA: Diagnosis not present

## 2018-09-19 DIAGNOSIS — D631 Anemia in chronic kidney disease: Secondary | ICD-10-CM | POA: Diagnosis not present

## 2018-09-19 DIAGNOSIS — I129 Hypertensive chronic kidney disease with stage 1 through stage 4 chronic kidney disease, or unspecified chronic kidney disease: Secondary | ICD-10-CM | POA: Diagnosis not present

## 2018-11-10 DIAGNOSIS — E782 Mixed hyperlipidemia: Secondary | ICD-10-CM | POA: Diagnosis not present

## 2018-11-10 DIAGNOSIS — R54 Age-related physical debility: Secondary | ICD-10-CM | POA: Diagnosis not present

## 2018-11-10 DIAGNOSIS — I130 Hypertensive heart and chronic kidney disease with heart failure and stage 1 through stage 4 chronic kidney disease, or unspecified chronic kidney disease: Secondary | ICD-10-CM | POA: Diagnosis not present

## 2018-11-10 DIAGNOSIS — E1121 Type 2 diabetes mellitus with diabetic nephropathy: Secondary | ICD-10-CM | POA: Diagnosis not present

## 2018-11-10 DIAGNOSIS — D649 Anemia, unspecified: Secondary | ICD-10-CM | POA: Diagnosis not present

## 2018-11-10 DIAGNOSIS — N184 Chronic kidney disease, stage 4 (severe): Secondary | ICD-10-CM | POA: Diagnosis not present

## 2018-11-10 DIAGNOSIS — I5032 Chronic diastolic (congestive) heart failure: Secondary | ICD-10-CM | POA: Diagnosis not present

## 2018-11-10 DIAGNOSIS — Z0000 Encounter for general adult medical examination without abnormal findings: Secondary | ICD-10-CM | POA: Diagnosis not present

## 2018-11-10 DIAGNOSIS — M1990 Unspecified osteoarthritis, unspecified site: Secondary | ICD-10-CM | POA: Diagnosis not present

## 2018-11-10 DIAGNOSIS — R609 Edema, unspecified: Secondary | ICD-10-CM | POA: Diagnosis not present

## 2018-11-10 DIAGNOSIS — Z1389 Encounter for screening for other disorder: Secondary | ICD-10-CM | POA: Diagnosis not present

## 2019-01-15 DIAGNOSIS — N184 Chronic kidney disease, stage 4 (severe): Secondary | ICD-10-CM | POA: Diagnosis not present

## 2019-01-23 DIAGNOSIS — D631 Anemia in chronic kidney disease: Secondary | ICD-10-CM | POA: Diagnosis not present

## 2019-01-23 DIAGNOSIS — Z1159 Encounter for screening for other viral diseases: Secondary | ICD-10-CM | POA: Diagnosis not present

## 2019-01-23 DIAGNOSIS — N184 Chronic kidney disease, stage 4 (severe): Secondary | ICD-10-CM | POA: Diagnosis not present

## 2019-01-23 DIAGNOSIS — I129 Hypertensive chronic kidney disease with stage 1 through stage 4 chronic kidney disease, or unspecified chronic kidney disease: Secondary | ICD-10-CM | POA: Diagnosis not present

## 2019-01-23 DIAGNOSIS — N2581 Secondary hyperparathyroidism of renal origin: Secondary | ICD-10-CM | POA: Diagnosis not present

## 2019-02-28 IMAGING — US US RENAL
1 series · 14 of 25 positions shown · non-contrast
Comparison: Ultrasound April 01, 2015.

CLINICAL DATA: Chronic kidney disease, stage IV.

EXAM:
RENAL / URINARY TRACT ULTRASOUND COMPLETE

[Series 1: us renal · 0.23mm/px · 14 of 54 slices shown]
[im 1/54]
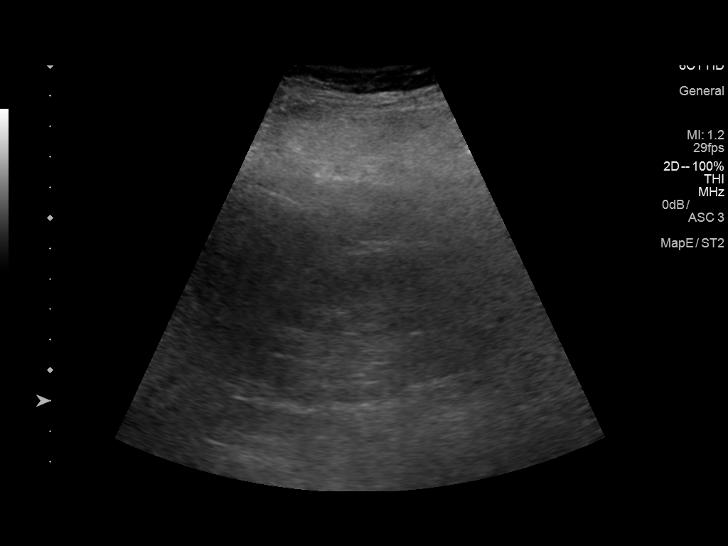
[im 5/54]
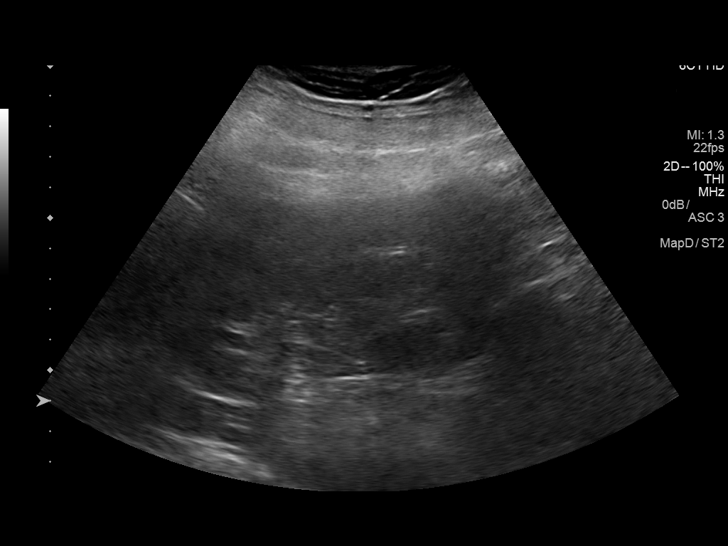
[im 9/54]
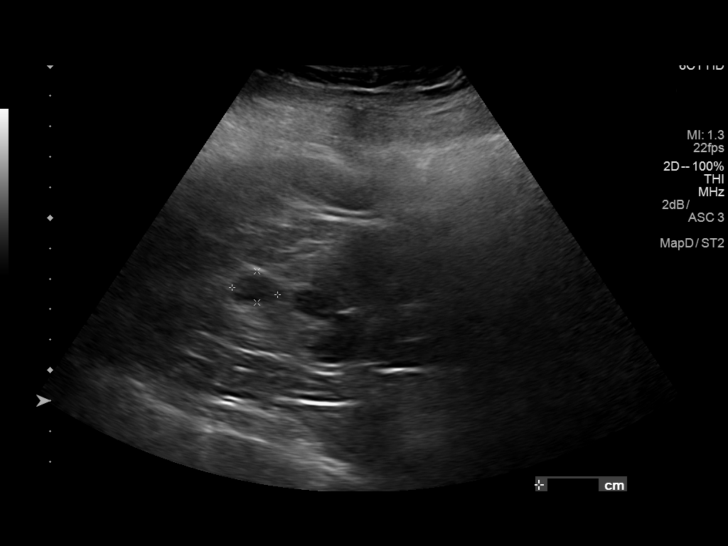
[im 14/54]
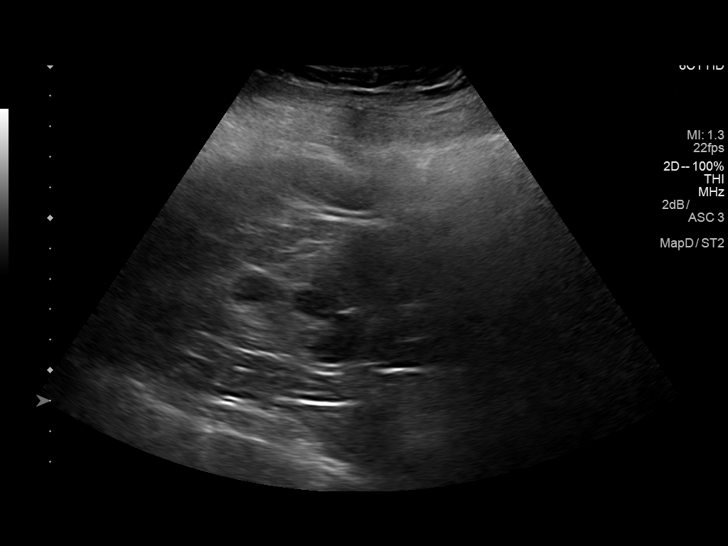
[im 18/54]
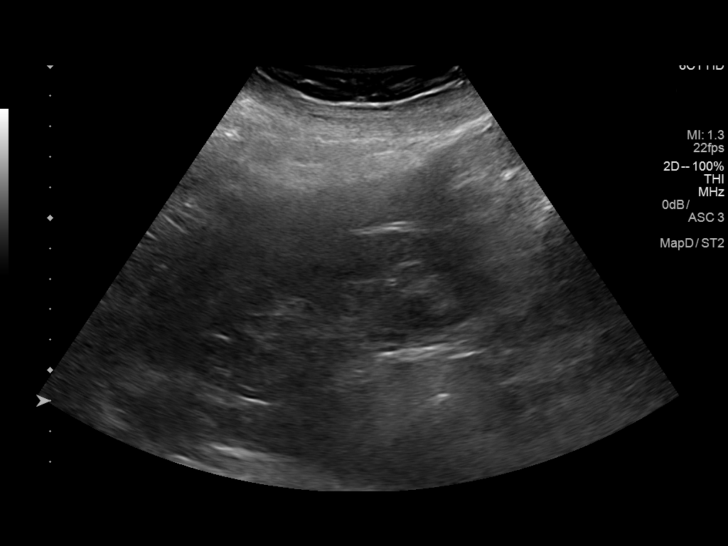
[im 20/54]
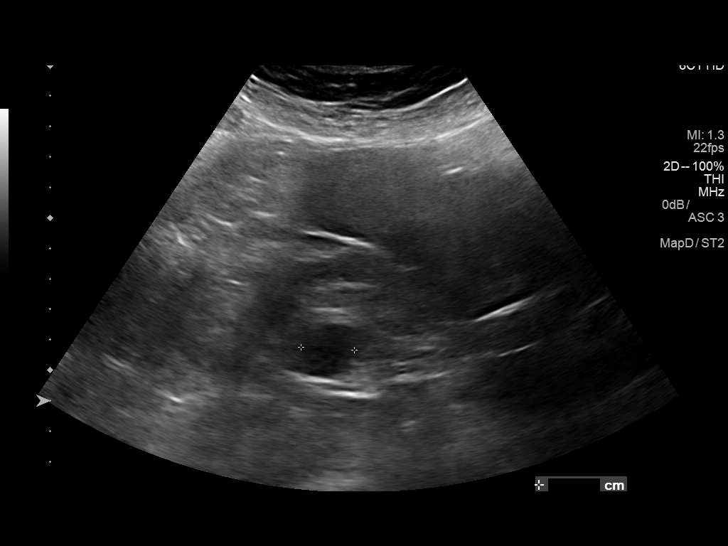
[im 25/54]
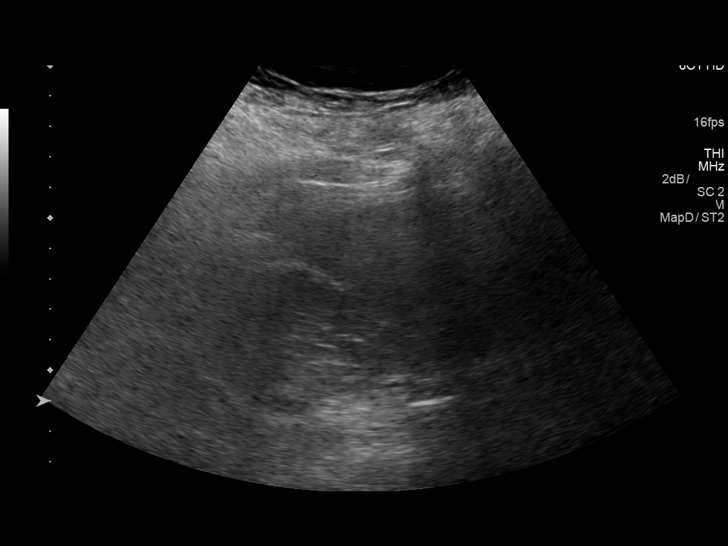
[im 29/54]
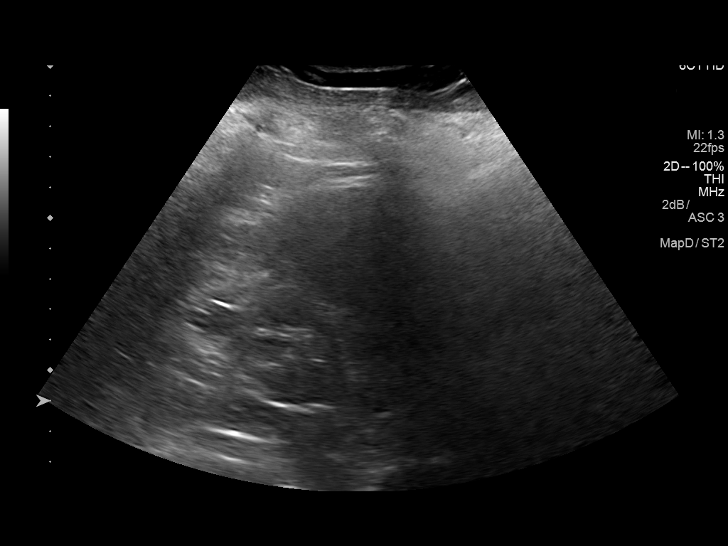
[im 34/54]
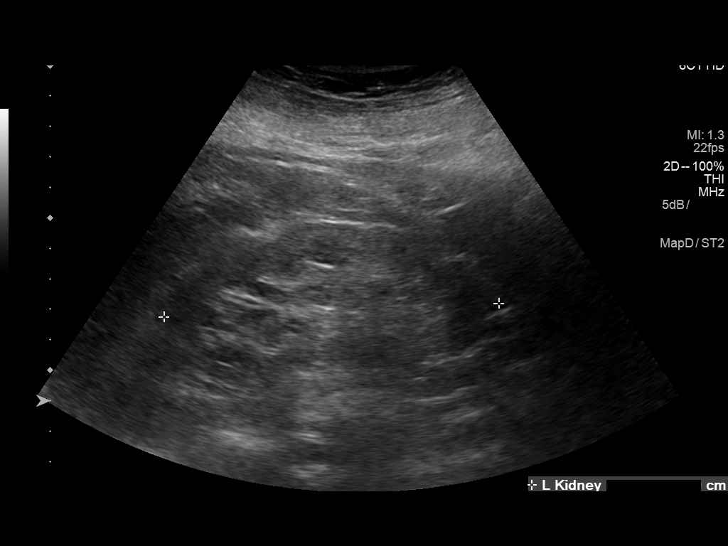
[im 36/54]
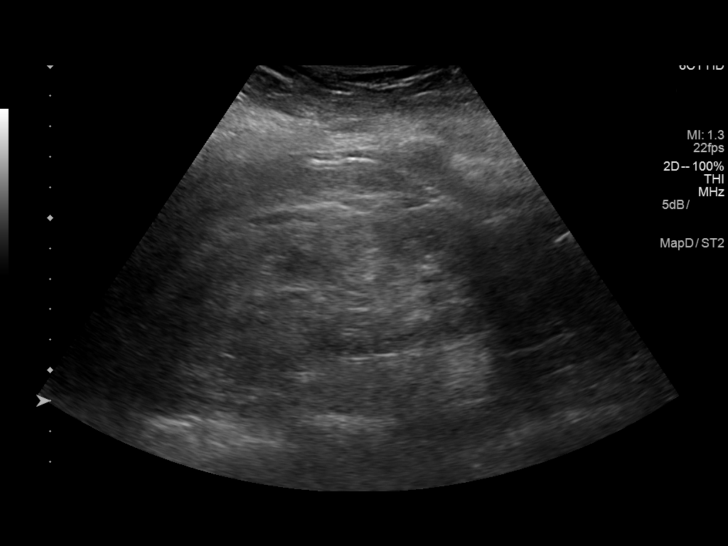
[im 40/54]
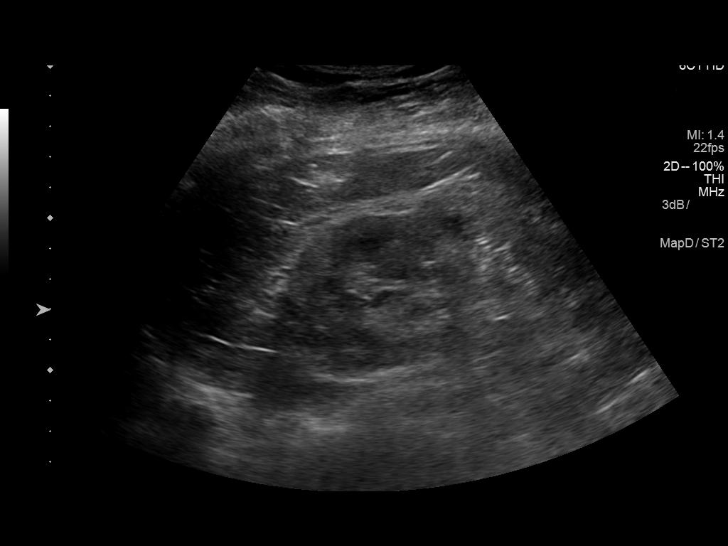
[im 45/54]
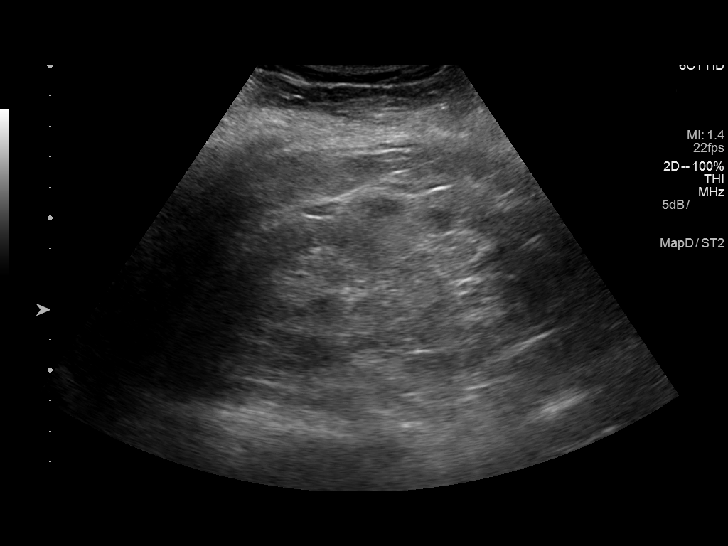
[im 49/54]
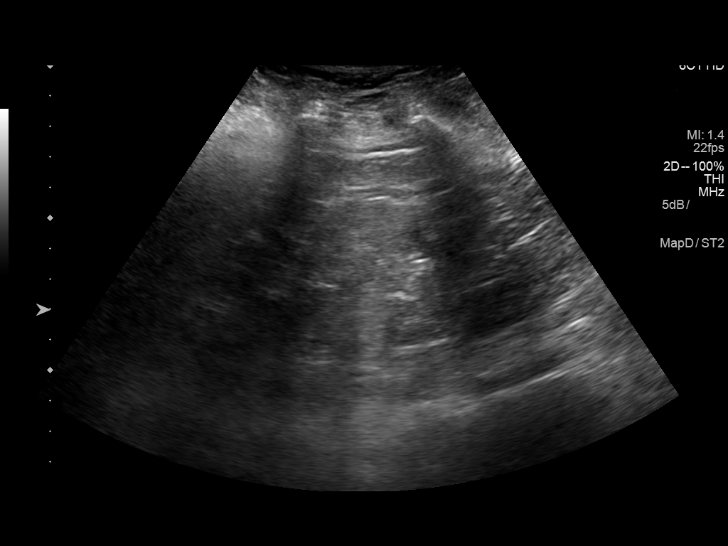
[im 54/54]
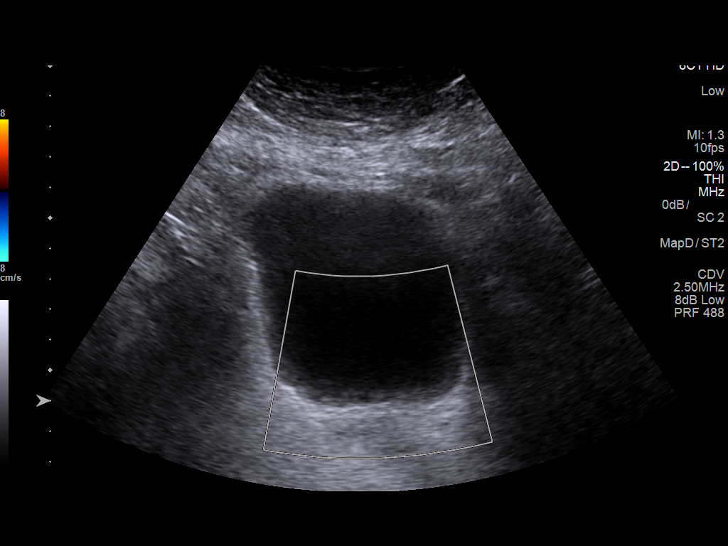

[14 of 25 positions shown; findings below may reference images not displayed]

FINDINGS: Right Kidney:

Length: 10.1 cm. Increased echogenicity of renal parenchyma is noted
suggesting medical renal disease. No hydronephrosis visualized.
cm cyst is seen in upper pole. 1.8 cm cyst is seen in lower pole.
These may be slightly enlarged compared to prior exam.

Left Kidney:

Length: 11 cm. Increased echogenicity of renal parenchyma is noted
suggesting medical renal disease. No hydronephrosis is noted. 1.4 cm
cyst is seen in lower pole. This is not significantly changed
compared to prior exam.

Bladder:

Appears normal for degree of bladder distention.
IMPRESSION: Increased echogenicity of renal parenchyma is noted bilaterally
consistent with medical renal disease.

Bilateral renal cysts are noted, with the right renal cysts possibly
slightly enlarged compared to prior exam. Due to limitations of
imaging, it is difficult to describe these as either simple or
complex. Followup ultrasound in 1 year is recommended.

## 2019-03-20 ENCOUNTER — Other Ambulatory Visit: Payer: Self-pay | Admitting: Family Medicine

## 2019-03-20 DIAGNOSIS — Z1231 Encounter for screening mammogram for malignant neoplasm of breast: Secondary | ICD-10-CM

## 2019-04-11 DIAGNOSIS — H2513 Age-related nuclear cataract, bilateral: Secondary | ICD-10-CM | POA: Diagnosis not present

## 2019-04-11 DIAGNOSIS — H02831 Dermatochalasis of right upper eyelid: Secondary | ICD-10-CM | POA: Diagnosis not present

## 2019-04-11 DIAGNOSIS — E119 Type 2 diabetes mellitus without complications: Secondary | ICD-10-CM | POA: Diagnosis not present

## 2019-04-11 DIAGNOSIS — H52203 Unspecified astigmatism, bilateral: Secondary | ICD-10-CM | POA: Diagnosis not present

## 2019-04-19 DIAGNOSIS — L720 Epidermal cyst: Secondary | ICD-10-CM | POA: Diagnosis not present

## 2019-04-30 ENCOUNTER — Other Ambulatory Visit: Payer: Self-pay

## 2019-04-30 ENCOUNTER — Ambulatory Visit
Admission: RE | Admit: 2019-04-30 | Discharge: 2019-04-30 | Disposition: A | Discharge status: Home / Self Care | Payer: Medicare Other | Source: Ambulatory Visit | Attending: Family Medicine | Admitting: Family Medicine

## 2019-04-30 DIAGNOSIS — Z1231 Encounter for screening mammogram for malignant neoplasm of breast: Secondary | ICD-10-CM

## 2019-05-02 ENCOUNTER — Other Ambulatory Visit: Payer: Self-pay | Admitting: Cardiovascular Disease

## 2019-05-14 DIAGNOSIS — I130 Hypertensive heart and chronic kidney disease with heart failure and stage 1 through stage 4 chronic kidney disease, or unspecified chronic kidney disease: Secondary | ICD-10-CM | POA: Diagnosis not present

## 2019-05-14 DIAGNOSIS — E1121 Type 2 diabetes mellitus with diabetic nephropathy: Secondary | ICD-10-CM | POA: Diagnosis not present

## 2019-05-14 DIAGNOSIS — E782 Mixed hyperlipidemia: Secondary | ICD-10-CM | POA: Diagnosis not present

## 2019-05-14 DIAGNOSIS — M1990 Unspecified osteoarthritis, unspecified site: Secondary | ICD-10-CM | POA: Diagnosis not present

## 2019-05-14 DIAGNOSIS — I5032 Chronic diastolic (congestive) heart failure: Secondary | ICD-10-CM | POA: Diagnosis not present

## 2019-05-14 DIAGNOSIS — R609 Edema, unspecified: Secondary | ICD-10-CM | POA: Diagnosis not present

## 2019-05-14 DIAGNOSIS — N184 Chronic kidney disease, stage 4 (severe): Secondary | ICD-10-CM | POA: Diagnosis not present

## 2019-05-14 DIAGNOSIS — R54 Age-related physical debility: Secondary | ICD-10-CM | POA: Diagnosis not present

## 2019-05-14 DIAGNOSIS — T466X5D Adverse effect of antihyperlipidemic and antiarteriosclerotic drugs, subsequent encounter: Secondary | ICD-10-CM | POA: Diagnosis not present

## 2019-05-14 DIAGNOSIS — D649 Anemia, unspecified: Secondary | ICD-10-CM | POA: Diagnosis not present

## 2019-05-30 DIAGNOSIS — N2581 Secondary hyperparathyroidism of renal origin: Secondary | ICD-10-CM | POA: Diagnosis not present

## 2019-05-30 DIAGNOSIS — D631 Anemia in chronic kidney disease: Secondary | ICD-10-CM | POA: Diagnosis not present

## 2019-05-30 DIAGNOSIS — N184 Chronic kidney disease, stage 4 (severe): Secondary | ICD-10-CM | POA: Diagnosis not present

## 2019-05-30 DIAGNOSIS — I129 Hypertensive chronic kidney disease with stage 1 through stage 4 chronic kidney disease, or unspecified chronic kidney disease: Secondary | ICD-10-CM | POA: Diagnosis not present

## 2019-06-18 ENCOUNTER — Other Ambulatory Visit: Payer: Self-pay | Admitting: Cardiovascular Disease

## 2019-06-19 ENCOUNTER — Telehealth: Payer: Self-pay | Admitting: Cardiovascular Disease

## 2019-06-19 NOTE — Telephone Encounter (Signed)
New message   Patient has an appt on Friday with Dr. Johnsie Cancel and states that her husband will be coming with her due to she is on a walker. Please advise.

## 2019-06-19 NOTE — Progress Notes (Signed)
Cardiology Office Note    Date:  06/22/2019   ID:  Tabitha Black, DOB 1932-10-01, MRN [id number]  PCP:  [name], MD  Cardiologist:  Dr. Johnsie Cancel  CC: follow up - diastolic CHF    HPI:  85 y.o.  With a history of HTN, HLD , DM, CDK and morbid obesity ? Diastolic dysfunction. She has had worsening dyspnea and renal failure. Has had elevated Cr with both ACE/ARB Echo reviewed from 11/18/16 EF 17-00% grade 2 diastolic dysfunction with estimated PA of 48 mmHg. ? 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 CKD (chronic kidney disease)   . Diabetes mellitus without complication ([place])    type II  . GERD (gastroesophageal reflux disease)   . High cholesterol   . Hypertension   . Osteoarthritis   . Skin cancer, basal cell     Past Surgical History:  Procedure Laterality Date  . ABDOMINAL HYSTERECTOMY N/A   . APPENDECTOMY N/A 1953  . BACK SURGERY    . CHOLECYSTECTOMY    . COLONOSCOPY N/A 02/22/2008  . TOTAL KNEE ARTHROPLASTY Right   . UMBILICAL HERNIA REPAIR N/A 04/02/2015   Procedure: LAPAROSCOPIC UMBILICAL HERNIA REPAIR WITH VENTRALIGHT MESH;  Surgeon: Ralene Ok, MD;  Location: [place];  Service: General;  Laterality: N/A;    Current Medications: Outpatient Medications Prior to Visit  Medication Sig Dispense Refill  . aspirin EC 81 MG tablet Take 81 mg by mouth every other day.     . Cholecalciferol (VITAMIN D3) 1.25 MG (50000 UT) TABS Take 1 tablet by mouth daily.    . cyclobenzaprine (FLEXERIL) 10 MG tablet Take 5 mg by mouth 3 (three) times daily as needed  for muscle spasms.     Marland Kitchen doxazosin (CARDURA) 2 MG tablet Take 2 mg by mouth daily.  3  . fenofibrate 160 MG tablet Take 160 mg by mouth every evening.     . furosemide (LASIX) 40 MG tablet TAKE 1 TABLET (40 MG TOTAL) DAILY BY MOUTH. 90 tablet 0  . hydrALAZINE (APRESOLINE) 50 MG tablet Take 1 tablet (50 mg total) by mouth 3 (three) times daily. Please keep upcoming appt in December for future refills. Thank you 270 tablet 0  . HYDROcodone-acetaminophen (NORCO/VICODIN) 5-325 MG tablet Take 1 tablet by mouth daily.    . metoprolol succinate (TOPROL-XL) 100 MG 24 hr tablet Take 100 mg by mouth every evening. Take with or immediately following a meal.    . triamterene-hydrochlorothiazide (MAXZIDE) 75-50 MG tablet Take 1 tablet by mouth daily.     No facility-administered medications prior to visit.      Allergies:   Other, Penicillins, Lovastatin, Lisinopril, Neomycin sulfate [neomycin], Niaspan [niacin], Valsartan, Vytorin [ezetimibe-simvastatin], and Zetia [ezetimibe]   Social History   Socioeconomic History  . Marital status: Married    Spouse name: Not on file  . Number of children: Not on file  . Years of education: Not on file  . Highest education level: Not on file  Occupational  History  . Not on file  Social Needs  . Financial resource strain: Not on file  . Food insecurity    Worry: Not on file    Inability: Not on file  . Transportation needs    Medical: Not on file    Non-medical: Not on file  Tobacco Use  . Smoking status: Never Smoker  . Smokeless tobacco: Never Used  Substance and Sexual Activity  . Alcohol use: Yes    Comment: twice a year  . Drug use: No  . Sexual activity: Not on file  Lifestyle  . Physical activity    Days per week: Not on file    Minutes per session: Not on file  . Stress: Not on file  Relationships  . Social Herbalist on phone: Not on file    Gets together: Not on file    Attends religious service: Not on file    Active  member of club or organization: Not on file    Attends meetings of clubs or organizations: Not on file    Relationship status: Not on file  Other Topics Concern  . Not on file  Social History Narrative  . Not on file     Family History:  The patient's family history includes Breast cancer in her sister; Heart attack in her father; Heart failure in her mother; Hypertension in her mother; Prostate cancer in her father.     ROS:   Please see the history of present illness.    ROS All other systems reviewed and are negative.   PHYSICAL EXAM:   VS:  BP 140/72   Pulse 66   Ht 5' 3.5" (1.613 m)   Wt 216 lb (98 kg)   SpO2 99%   BMI 37.66 kg/m     Affect appropriate Obese female  HEENT: normal Neck supple with no adenopathy JVP normal no bruits no thyromegaly Lungs clear with no wheezing and good diaphragmatic motion Heart:  S1/S2 no murmur, no rub, gallop or click PMI normal Abdomen: benighn, BS positve, no tenderness, no AAA no bruit.  No HSM or HJR Distal pulses intact with no bruits Plus 2 bilateral edema Neuro non-focal Skin warm and dry No muscular weakness    Wt Readings from Last 3 Encounters:  06/22/19 216 lb (98 kg)  06/20/18 223 lb 6.4 oz (101.3 kg)  04/21/18 218 lb (98.9 kg)      Studies/Labs Reviewed:   EKG:     06/22/19 SR rate 66 chronic biphasic inferior lateral T wave changes   Recent Labs: No results found for requested labs within last 8760 hours.   Lipid Panel No results found for: CHOL, TRIG, HDL, CHOLHDL, VLDL, LDLCALC, LDLDIRECT  Additional studies/ records that were reviewed today include:  2D ECHO: 11/18/2016 LV EF: 60% - 65% Study Conclusions - Left ventricle: The cavity size was normal. There appeared to asymmetric hypertrophy of the LV apex. Systolic function was normal. The estimated ejection fraction was in the range of 60% to 65%. Wall motion was normal; there were no regional wall motion abnormalities. Features are  consistent with a pseudonormal left ventricular filling pattern, with concomitant abnormal relaxation and increased filling pressure (grade 2 diastolic dysfunction). - Aortic valve: There was no stenosis. - Mitral valve: Mildly calcified annulus. There was trivial regurgitation. - Left atrium: The atrium was moderately dilated. - Right ventricle: The cavity size was normal. Systolic function was normal. - Tricuspid valve: Peak RV-RA gradient (S): 45  mm Hg. - Pulmonary arteries: PA peak pressure: 48 mm Hg (S). - Inferior vena cava: The vessel was normal in size. The respirophasic diameter changes were in the normal range (= 50%), consistent with normal central venous pressure. Impressions: - Normal LV size with asymmetric hypertrophy of the LV apex (spade-shaped ventricle). This is suggestive of apical hypertrophic cardiomyopathy. EF 60-65%. Normal RV size and systolic function. No significant valvular abnormalities. Mild pulmonary hypertension.    ASSESSMENT & PLAN:   Acute on chronic diastolic CHF:  Not clear that her edema has anything to due with heart Needs Better BP control low sodium diet continue diuretic   Dyspnea:  Continue diuretics clear lungs Discussed weight loss and better HTN control   Abnormal ECHO: ? Apical HOCM  cannot have MRI with gadolinium due to CRF Only Rx Needed is better BP control and monitoring of renal failure  HTN:  Improved with hydralazine unable to use ACE/ARB/Amlodipoine   HLD: previously did not tolerate Lovastatin. On Co Q 10 and lipids have been moderately well controlled.  DMT2: HgA1c 6.1 most recently. Not on any medications for his  CKD: now followed by Dr. Posey Pronto. [place] no RAS  Baseline Cr around 2.5  Lasix lowered to 40 mg daily  Aldactone d/c Anemia requiring iron injections     Jenkins Rouge

## 2019-06-19 NOTE — Telephone Encounter (Signed)
Patient stated she fell last week and will need assistance. Informed patient that her husband can come up to office visit with her to help. Patient verbalized understanding.

## 2019-06-22 ENCOUNTER — Other Ambulatory Visit: Payer: Self-pay

## 2019-06-22 ENCOUNTER — Ambulatory Visit (INDEPENDENT_AMBULATORY_CARE_PROVIDER_SITE_OTHER): Payer: Medicare Other | Admitting: Cardiovascular Disease

## 2019-06-22 ENCOUNTER — Encounter: Payer: Self-pay | Admitting: Cardiovascular Disease

## 2019-06-22 VITALS — BP 140/72 | HR 66 | Ht 63.5 in | Wt 216.0 lb

## 2019-06-22 DIAGNOSIS — I5043 Acute on chronic combined systolic (congestive) and diastolic (congestive) heart failure: Secondary | ICD-10-CM

## 2019-06-22 DIAGNOSIS — E785 Hyperlipidemia, unspecified: Secondary | ICD-10-CM | POA: Diagnosis not present

## 2019-06-22 DIAGNOSIS — I10 Essential (primary) hypertension: Secondary | ICD-10-CM | POA: Diagnosis not present

## 2019-06-22 NOTE — Patient Instructions (Addendum)

## 2019-09-03 ENCOUNTER — Other Ambulatory Visit: Payer: Self-pay | Admitting: Cardiovascular Disease

## 2019-09-19 DIAGNOSIS — N184 Chronic kidney disease, stage 4 (severe): Secondary | ICD-10-CM | POA: Diagnosis not present

## 2019-09-19 DIAGNOSIS — N189 Chronic kidney disease, unspecified: Secondary | ICD-10-CM | POA: Diagnosis not present

## 2019-09-19 DIAGNOSIS — N2581 Secondary hyperparathyroidism of renal origin: Secondary | ICD-10-CM | POA: Diagnosis not present

## 2019-09-27 DIAGNOSIS — D631 Anemia in chronic kidney disease: Secondary | ICD-10-CM | POA: Diagnosis not present

## 2019-09-27 DIAGNOSIS — I129 Hypertensive chronic kidney disease with stage 1 through stage 4 chronic kidney disease, or unspecified chronic kidney disease: Secondary | ICD-10-CM | POA: Diagnosis not present

## 2019-09-27 DIAGNOSIS — N184 Chronic kidney disease, stage 4 (severe): Secondary | ICD-10-CM | POA: Diagnosis not present

## 2019-09-27 DIAGNOSIS — N2581 Secondary hyperparathyroidism of renal origin: Secondary | ICD-10-CM | POA: Diagnosis not present

## 2019-09-28 ENCOUNTER — Other Ambulatory Visit: Payer: Self-pay | Admitting: Cardiovascular Disease

## 2019-11-12 DIAGNOSIS — I5032 Chronic diastolic (congestive) heart failure: Secondary | ICD-10-CM | POA: Diagnosis not present

## 2019-11-12 DIAGNOSIS — Z0000 Encounter for general adult medical examination without abnormal findings: Secondary | ICD-10-CM | POA: Diagnosis not present

## 2019-11-12 DIAGNOSIS — M1990 Unspecified osteoarthritis, unspecified site: Secondary | ICD-10-CM | POA: Diagnosis not present

## 2019-11-12 DIAGNOSIS — Z1389 Encounter for screening for other disorder: Secondary | ICD-10-CM | POA: Diagnosis not present

## 2019-11-12 DIAGNOSIS — I130 Hypertensive heart and chronic kidney disease with heart failure and stage 1 through stage 4 chronic kidney disease, or unspecified chronic kidney disease: Secondary | ICD-10-CM | POA: Diagnosis not present

## 2019-11-12 DIAGNOSIS — D649 Anemia, unspecified: Secondary | ICD-10-CM | POA: Diagnosis not present

## 2019-11-12 DIAGNOSIS — E1122 Type 2 diabetes mellitus with diabetic chronic kidney disease: Secondary | ICD-10-CM | POA: Diagnosis not present

## 2019-11-12 DIAGNOSIS — N184 Chronic kidney disease, stage 4 (severe): Secondary | ICD-10-CM | POA: Diagnosis not present

## 2019-11-12 DIAGNOSIS — D692 Other nonthrombocytopenic purpura: Secondary | ICD-10-CM | POA: Diagnosis not present

## 2019-11-12 DIAGNOSIS — R609 Edema, unspecified: Secondary | ICD-10-CM | POA: Diagnosis not present

## 2019-11-12 DIAGNOSIS — E782 Mixed hyperlipidemia: Secondary | ICD-10-CM | POA: Diagnosis not present

## 2020-02-12 DIAGNOSIS — I129 Hypertensive chronic kidney disease with stage 1 through stage 4 chronic kidney disease, or unspecified chronic kidney disease: Secondary | ICD-10-CM | POA: Diagnosis not present

## 2020-02-12 DIAGNOSIS — E1122 Type 2 diabetes mellitus with diabetic chronic kidney disease: Secondary | ICD-10-CM | POA: Diagnosis not present

## 2020-02-12 DIAGNOSIS — L03115 Cellulitis of right lower limb: Secondary | ICD-10-CM | POA: Diagnosis not present

## 2020-02-12 DIAGNOSIS — N184 Chronic kidney disease, stage 4 (severe): Secondary | ICD-10-CM | POA: Diagnosis not present

## 2020-03-04 DIAGNOSIS — R609 Edema, unspecified: Secondary | ICD-10-CM | POA: Diagnosis not present

## 2020-03-04 DIAGNOSIS — L98499 Non-pressure chronic ulcer of skin of other sites with unspecified severity: Secondary | ICD-10-CM | POA: Diagnosis not present

## 2020-03-31 ENCOUNTER — Other Ambulatory Visit: Payer: Self-pay | Admitting: Family Medicine

## 2020-03-31 DIAGNOSIS — Z1231 Encounter for screening mammogram for malignant neoplasm of breast: Secondary | ICD-10-CM

## 2020-04-25 DIAGNOSIS — Z23 Encounter for immunization: Secondary | ICD-10-CM | POA: Diagnosis not present

## 2020-04-30 ENCOUNTER — Ambulatory Visit
Admission: RE | Admit: 2020-04-30 | Discharge: 2020-04-30 | Disposition: A | Discharge status: Home / Self Care | Payer: Medicare Other | Source: Ambulatory Visit | Attending: Family Medicine | Admitting: Family Medicine

## 2020-04-30 ENCOUNTER — Other Ambulatory Visit: Payer: Self-pay

## 2020-04-30 DIAGNOSIS — Z1231 Encounter for screening mammogram for malignant neoplasm of breast: Secondary | ICD-10-CM | POA: Diagnosis not present

## 2020-05-15 ENCOUNTER — Other Ambulatory Visit: Payer: Self-pay

## 2020-05-15 ENCOUNTER — Encounter: Payer: Self-pay | Admitting: Podiatry

## 2020-05-15 ENCOUNTER — Ambulatory Visit (INDEPENDENT_AMBULATORY_CARE_PROVIDER_SITE_OTHER): Payer: Medicare Other | Admitting: Podiatry

## 2020-05-15 DIAGNOSIS — M79676 Pain in unspecified toe(s): Secondary | ICD-10-CM | POA: Diagnosis not present

## 2020-05-15 DIAGNOSIS — B351 Tinea unguium: Secondary | ICD-10-CM | POA: Diagnosis not present

## 2020-05-15 DIAGNOSIS — Q828 Other specified congenital malformations of skin: Secondary | ICD-10-CM

## 2020-05-16 DIAGNOSIS — D692 Other nonthrombocytopenic purpura: Secondary | ICD-10-CM | POA: Diagnosis not present

## 2020-05-16 DIAGNOSIS — E782 Mixed hyperlipidemia: Secondary | ICD-10-CM | POA: Diagnosis not present

## 2020-05-16 DIAGNOSIS — I5032 Chronic diastolic (congestive) heart failure: Secondary | ICD-10-CM | POA: Diagnosis not present

## 2020-05-16 DIAGNOSIS — R54 Age-related physical debility: Secondary | ICD-10-CM | POA: Diagnosis not present

## 2020-05-16 DIAGNOSIS — D649 Anemia, unspecified: Secondary | ICD-10-CM | POA: Diagnosis not present

## 2020-05-16 DIAGNOSIS — T466X5A Adverse effect of antihyperlipidemic and antiarteriosclerotic drugs, initial encounter: Secondary | ICD-10-CM | POA: Diagnosis not present

## 2020-05-16 DIAGNOSIS — I130 Hypertensive heart and chronic kidney disease with heart failure and stage 1 through stage 4 chronic kidney disease, or unspecified chronic kidney disease: Secondary | ICD-10-CM | POA: Diagnosis not present

## 2020-05-16 DIAGNOSIS — N184 Chronic kidney disease, stage 4 (severe): Secondary | ICD-10-CM | POA: Diagnosis not present

## 2020-05-16 DIAGNOSIS — M1990 Unspecified osteoarthritis, unspecified site: Secondary | ICD-10-CM | POA: Diagnosis not present

## 2020-05-16 DIAGNOSIS — E1122 Type 2 diabetes mellitus with diabetic chronic kidney disease: Secondary | ICD-10-CM | POA: Diagnosis not present

## 2020-05-16 DIAGNOSIS — R609 Edema, unspecified: Secondary | ICD-10-CM | POA: Diagnosis not present

## 2020-05-17 NOTE — Progress Notes (Signed)
Subjective:  Patient ID: Tabitha Black, female    DOB: 1932-10-13,  MRN: [id number] HPI Chief Complaint  Patient presents with  . Debridement    Requesting toenail trim  . New Patient (Initial Visit)    86 y.o. female presents with the above complaint.   ROS: Denies fever chills nausea vomiting muscle aches pains calf pain back pain chest pain shortness of breath.  Past Medical History:  Diagnosis Date  . CKD (chronic kidney disease)   . Diabetes mellitus without complication ([place])    type II  . GERD (gastroesophageal reflux disease)   . High cholesterol   . Hypertension   . Osteoarthritis   . Skin cancer, basal cell    Past Surgical History:  Procedure Laterality Date  . ABDOMINAL HYSTERECTOMY N/A   . APPENDECTOMY N/A 1953  . BACK SURGERY    . CHOLECYSTECTOMY    . COLONOSCOPY N/A 02/22/2008  . TOTAL KNEE ARTHROPLASTY Right   . UMBILICAL HERNIA REPAIR N/A 04/02/2015   Procedure: LAPAROSCOPIC UMBILICAL HERNIA REPAIR WITH VENTRALIGHT MESH;  Surgeon: Ralene Ok, MD;  Location: [place];  Service: General;  Laterality: N/A;    Current Outpatient Medications:  .  aspirin EC 81 MG tablet, Take 81 mg by mouth every other day. , Disp: , Rfl:  .  Cholecalciferol (VITAMIN D3) 1.25 MG (50000 UT) TABS, Take 1 tablet by mouth daily., Disp: , Rfl:  .  cyclobenzaprine (FLEXERIL) 10 MG tablet, Take 5 mg by mouth 3 (three) times daily as needed for muscle spasms. , Disp: , Rfl:  .  doxazosin (CARDURA) 4 MG tablet, Take 4 mg by mouth daily., Disp: , Rfl:  .  fenofibrate 160 MG tablet, Take 160 mg by mouth every evening. , Disp: , Rfl:  .  furosemide (LASIX) 40 MG tablet, TAKE 1 TABLET (40 MG TOTAL) DAILY BY MOUTH., Disp: 90 tablet, Rfl: 2 .  gemfibrozil (LOPID) 600 MG tablet, Take by mouth., Disp: , Rfl:  .  hydrALAZINE (APRESOLINE) 50 MG tablet, Take 1 tablet (50 mg total) by mouth 3 (three) times daily., Disp: 270 tablet, Rfl: 2 .  HYDROcodone-acetaminophen (NORCO/VICODIN) 5-325 MG  tablet, Take 1 tablet by mouth daily., Disp: , Rfl:  .  Iron-FA-B Cmp-C-Biot-Probiotic (FUSION PLUS) CAPS, Take 1 capsule by mouth daily., Disp: , Rfl:  .  metoprolol succinate (TOPROL-XL) 100 MG 24 hr tablet, Take 100 mg by mouth every evening. Take with or immediately following a meal., Disp: , Rfl:  .  triamterene-hydrochlorothiazide (MAXZIDE) 75-50 MG tablet, Take 1 tablet by mouth daily., Disp: , Rfl:   Allergies  Allergen Reactions  . Other Swelling    Topical mycin, unsure of which one, caused opthalmic swelling  . Penicillins Shortness Of Breath, Itching, Swelling and Rash  . Lovastatin Other (See Comments)    Weakness and myalgias, maybe to statins?  . Lisinopril Other (See Comments)    Hyperkalemia   . Neomycin Sulfate [Neomycin] Other (See Comments)    Redness and burning in face  . Niaspan [Niacin] Hives  . Valsartan Other (See Comments)    Hyperkalemia   . Vytorin [Ezetimibe-Simvastatin] Other (See Comments)    myalgias  . Zetia [Ezetimibe] Other (See Comments)    Leg pain   Review of Systems Objective:  There were no vitals filed for this visit.  General: Well developed, nourished, in no acute distress, alert and oriented x3   Dermatological: Skin is warm, dry and supple bilateral. Nails x 10 are poorly maintained  they are thick overgrown curved dorsally clinically mycotic with subungual debris and painful on palpation.; remaining integument appears unremarkable at this time. There are no open sores, no preulcerative lesions, no rash or signs of infection present.  Vascular: Dorsalis Pedis artery and Posterior Tibial artery pedal pulses are 2/4 bilateral with immedate capillary fill time. Pedal hair growth present. No varicosities and no lower extremity edema present bilateral.   Neruologic: Grossly intact via light touch bilateral. Vibratory intact via tuning fork bilateral. Protective threshold with Semmes Wienstein monofilament intact to all pedal sites bilateral.  Patellar and Achilles deep tendon reflexes 2+ bilateral. No Babinski or clonus noted bilateral.   Musculoskeletal: No gross boney pedal deformities bilateral. No pain, crepitus, or limitation noted with foot and ankle range of motion bilateral. Muscular strength 5/5 in all groups tested bilateral.  Gait: Unassisted, Nonantalgic.    Radiographs:  None taken  Assessment & Plan:   Assessment: Grossly ill Toenails thick yellow dystrophic clinically mycotic painful.  Plan: Debridement of toenails in thickness and in length secondary to severe onychomycosis she will follow up with Dr. Riki Rusk T. [place], [place]

## 2020-05-19 DIAGNOSIS — E1122 Type 2 diabetes mellitus with diabetic chronic kidney disease: Secondary | ICD-10-CM | POA: Diagnosis not present

## 2020-06-01 ENCOUNTER — Other Ambulatory Visit: Payer: Self-pay | Admitting: Cardiovascular Disease

## 2020-06-04 DIAGNOSIS — Z23 Encounter for immunization: Secondary | ICD-10-CM | POA: Diagnosis not present

## 2020-06-06 DIAGNOSIS — N184 Chronic kidney disease, stage 4 (severe): Secondary | ICD-10-CM | POA: Diagnosis not present

## 2020-06-06 DIAGNOSIS — E1122 Type 2 diabetes mellitus with diabetic chronic kidney disease: Secondary | ICD-10-CM | POA: Diagnosis not present

## 2020-06-06 DIAGNOSIS — I129 Hypertensive chronic kidney disease with stage 1 through stage 4 chronic kidney disease, or unspecified chronic kidney disease: Secondary | ICD-10-CM | POA: Diagnosis not present

## 2020-06-06 DIAGNOSIS — N2581 Secondary hyperparathyroidism of renal origin: Secondary | ICD-10-CM | POA: Diagnosis not present

## 2020-06-06 DIAGNOSIS — N189 Chronic kidney disease, unspecified: Secondary | ICD-10-CM | POA: Diagnosis not present

## 2020-06-06 DIAGNOSIS — D631 Anemia in chronic kidney disease: Secondary | ICD-10-CM | POA: Diagnosis not present

## 2020-07-07 NOTE — Progress Notes (Signed)
Cardiology Office Note    Date:  07/10/2020   ID:  Tabitha Black, DOB 1933/06/20, MRN [id number]  PCP:  [name], MD  Cardiologist:  Dr. Johnsie Cancel  CC: follow up - diastolic CHF    HPI:  86 y.o.  With a history of HTN, HLD , DM, CDK and morbid obesity ? Diastolic dysfunction. She has had worsening dyspnea and renal failure. Has had elevated Cr with both ACE/ARB Echo reviewed from 11/18/16 EF 79-15% grade 2 diastolic dysfunction with estimated PA of 48 mmHg. ? 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 CKD (chronic kidney disease)   . Diabetes mellitus without complication ([place])    type II  . GERD (gastroesophageal reflux disease)   . High cholesterol   . Hypertension   . Osteoarthritis   . Skin cancer, basal cell     Past Surgical History:  Procedure Laterality Date  . ABDOMINAL HYSTERECTOMY N/A   . APPENDECTOMY N/A 1953  . BACK SURGERY    . CHOLECYSTECTOMY    . COLONOSCOPY N/A 02/22/2008  . TOTAL KNEE ARTHROPLASTY Right   . UMBILICAL HERNIA REPAIR N/A 04/02/2015   Procedure: LAPAROSCOPIC UMBILICAL HERNIA REPAIR WITH VENTRALIGHT MESH;  Surgeon: Ralene Ok, MD;  Location: [facility];  Service: General;  Laterality: N/A;    Current Medications: Outpatient Medications Prior to Visit  Medication Sig Dispense Refill  . aspirin EC 81 MG tablet Take 81 mg by mouth every other day.     . Cholecalciferol (VITAMIN D3) 1.25 MG (50000 UT) TABS Take 1 tablet by mouth daily.    . cyclobenzaprine (FLEXERIL) 10 MG tablet Take 5 mg by  mouth 3 (three) times daily as needed for muscle spasms.     Marland Kitchen doxazosin (CARDURA) 4 MG tablet Take 4 mg by mouth daily.    . fenofibrate 160 MG tablet Take 160 mg by mouth every evening.     Marland Kitchen gemfibrozil (LOPID) 600 MG tablet Take by mouth.    . hydrALAZINE (APRESOLINE) 50 MG tablet TAKE 1 TABLET BY MOUTH THREE TIMES A DAY 270 tablet 1  . HYDROcodone-acetaminophen (NORCO/VICODIN) 5-325 MG tablet Take 1 tablet by mouth daily.    . Iron-FA-B Cmp-C-Biot-Probiotic (FUSION PLUS) CAPS Take 1 capsule by mouth daily.    . metoprolol succinate (TOPROL-XL) 100 MG 24 hr tablet Take 100 mg by mouth every evening. Take with or immediately following a meal.    . Multiple Vitamins-Minerals (HAIR SKIN AND NAILS FORMULA) TABS 1 tablet    . furosemide (LASIX) 40 MG tablet TAKE 1 TABLET (40 MG TOTAL) DAILY BY MOUTH. 90 tablet 2  . triamterene-hydrochlorothiazide (MAXZIDE) 75-50 MG tablet Take 1 tablet by mouth daily.     No facility-administered medications prior to visit.     Allergies:   Other, Penicillins, Lovastatin, Lisinopril, Neomycin sulfate [neomycin], Niaspan [niacin], Rofecoxib, Valsartan, Vytorin [ezetimibe-simvastatin], and Zetia [ezetimibe]   Social History   Socioeconomic History  . Marital status:  Married    Spouse name: Not on file  . Number of children: Not on file  . Years of education: Not on file  . Highest education level: Not on file  Occupational History  . Not on file  Tobacco Use  . Smoking status: Never Smoker  . Smokeless tobacco: Never Used  Substance and Sexual Activity  . Alcohol use: Yes    Comment: twice a year  . Drug use: No  . Sexual activity: Not on file  Other Topics Concern  . Not on file  Social History Narrative  . Not on file   Social Determinants of Health   Financial Resource Strain: Not on file  Food Insecurity: Not on file  Transportation Needs: Not on file  Physical Activity: Not on file  Stress: Not on file  Social Connections: Not on file      Family History:  The patient's family history includes Breast cancer in her sister; Heart attack in her father; Heart failure in her mother; Hypertension in her mother; Prostate cancer in her father.     ROS:   Please see the history of present illness.    ROS All other systems reviewed and are negative.   PHYSICAL EXAM:   VS:  BP (!) 150/80   Pulse 96   Ht 5' 3.5" (1.613 m)   Wt 97.8 kg   SpO2 96%   BMI 37.59 kg/m     Affect appropriate Obese female  HEENT: normal Neck supple with no adenopathy JVP normal no bruits no thyromegaly Lungs clear with no wheezing and good diaphragmatic motion Heart:  S1/S2 no murmur, no rub, gallop or click PMI normal Abdomen: benighn, BS positve, no tenderness, no AAA no bruit.  No HSM or HJR Distal pulses intact with no bruits Plus 2 bilateral edema Neuro non-focal Skin warm and dry No muscular weakness    Wt Readings from Last 3 Encounters:  07/10/20 97.8 kg  06/22/19 98 kg  06/20/18 101.3 kg      Studies/Labs Reviewed:   EKG:     06/22/19 SR rate 66 chronic biphasic inferior lateral T wave changes   Recent Labs: No results found for requested labs within last 8760 hours.   Lipid Panel No results found for: CHOL, TRIG, HDL, CHOLHDL, VLDL, LDLCALC, LDLDIRECT  Additional studies/ records that were reviewed today include:  2D ECHO: 11/18/2016 LV EF: 60% - 65% Study Conclusions - Left ventricle: The cavity size was normal. There appeared to asymmetric hypertrophy of the LV apex. Systolic function was normal. The estimated ejection fraction was in the range of 60% to 65%. Wall motion was normal; there were no regional wall motion abnormalities. Features are consistent with a pseudonormal left ventricular filling pattern, with concomitant abnormal relaxation and increased filling pressure (grade 2 diastolic dysfunction). - Aortic valve: There was no stenosis. - Mitral valve: Mildly calcified annulus.  There was trivial regurgitation. - Left atrium: The atrium was moderately dilated. - Right ventricle: The cavity size was normal. Systolic function was normal. - Tricuspid valve: Peak RV-RA gradient (S): 45 mm Hg. - Pulmonary arteries: PA peak pressure: 48 mm Hg (S). - Inferior vena cava: The vessel was normal in size. The respirophasic diameter changes were in the normal range (= 50%), consistent with normal central venous pressure. Impressions: - Normal LV size with asymmetric hypertrophy of the LV apex (spade-shaped ventricle). This is suggestive of apical hypertrophic cardiomyopathy. EF 60-65%. Normal RV size and systolic function. No significant valvular abnormalities.  Mild pulmonary hypertension.    ASSESSMENT & PLAN:   Acute on chronic diastolic CHF:  Not clear that her edema has anything to due with heart Needs Better BP control low sodium diet D/c triamterene/Lasix start torsemide 20 mg bid   Dyspnea:  Weight loss and control of BP most important    Abnormal ECHO: ? Apical HOCM  cannot have MRI with gadolinium due to CRF Only Rx Needed is better BP control and monitoring of renal failure  HTN:  Improved with hydralazine unable to use ACE/ARB/Amlodipoine   HLD: previously did not tolerate Lovastatin. On Co Q 10 and lipids have been moderately well controlled.  DMT2: HgA1c 6.1 most recently. Not on any medications for his  CKD: now followed by Dr. Posey Pronto. [place] no RAS  Baseline Cr around 2.5  Lasix lowered to 40 mg daily  Aldactone d/c Anemia requiring iron injections  Seems stable by labs reviewed in April Will check BMET and BNP   BMET in 2 weeks D/c Lasix/triameterene HcTZ Start Torsemide 20 bid F/U in 3 months     Jenkins Rouge

## 2020-07-10 ENCOUNTER — Encounter: Payer: Self-pay | Admitting: Cardiovascular Disease

## 2020-07-10 ENCOUNTER — Other Ambulatory Visit: Payer: Self-pay

## 2020-07-10 ENCOUNTER — Ambulatory Visit (INDEPENDENT_AMBULATORY_CARE_PROVIDER_SITE_OTHER): Payer: Medicare Other | Admitting: Cardiovascular Disease

## 2020-07-10 VITALS — BP 150/80 | HR 96 | Ht 63.5 in | Wt 215.6 lb

## 2020-07-10 DIAGNOSIS — I5043 Acute on chronic combined systolic (congestive) and diastolic (congestive) heart failure: Secondary | ICD-10-CM | POA: Diagnosis not present

## 2020-07-10 DIAGNOSIS — I10 Essential (primary) hypertension: Secondary | ICD-10-CM | POA: Diagnosis not present

## 2020-07-10 MED ORDER — TORSEMIDE 20 MG PO TABS: 20.0000 mg | ORAL_TABLET | Freq: Two times a day (BID) | ORAL | 3 refills | Status: DC

## 2020-07-10 NOTE — Patient Instructions (Addendum)
Medication Instructions: Your physician has recommended you make the following change in your medication:  1-STOP- Lasix  2-STOP- Maxzide 3-START - Torsemide 20 mg by mouth twice daily.  *If you need a refill on your cardiac medications before your next appointment, please call your pharmacy*  Lab Work: Your physician recommends that you return for lab work in: 2 weeks for BMET  If you have labs (blood work) drawn today and your tests are completely normal, you will receive your results only by: Marland Kitchen MyChart Message (if you have MyChart) OR . A paper copy in the mail If you have any lab test that is abnormal or we need to change your treatment, we will call you to review the results.  Testing/Procedures: None ordered today.  Follow-Up: At [facility], you and your health needs are our priority.  As part of our continuing mission to provide you with exceptional heart care, we have created designated Provider Care Teams.  These Care Teams include your primary Cardiologist (physician) and Advanced Practice Providers (APPs -  Physician Assistants and Nurse Practitioners) who all work together to provide you with the care you need, when you need it.  We recommend signing up for the patient portal called "MyChart".  Sign up information is provided on this After Visit Summary.  MyChart is used to connect with patients for Virtual Visits (Telemedicine).  Patients are able to view lab/test results, encounter notes, upcoming appointments, etc.  Non-urgent messages can be sent to your provider as well.   To learn more about what you can do with MyChart, go to [email or web address].    Your next appointment:   6 to 8 weeks  The format for your next appointment:   In Person  Provider:   You may see Jenkins Rouge, MD or one of the following Advanced Practice Providers on your designated Care Team:    Truitt Merle, NP  Cecilie Kicks, NP  Kathyrn Drown, NP

## 2020-07-25 ENCOUNTER — Other Ambulatory Visit: Payer: Medicare Other | Admitting: *Deleted

## 2020-07-25 ENCOUNTER — Other Ambulatory Visit: Payer: Self-pay

## 2020-07-25 DIAGNOSIS — I10 Essential (primary) hypertension: Secondary | ICD-10-CM

## 2020-07-25 DIAGNOSIS — I5043 Acute on chronic combined systolic (congestive) and diastolic (congestive) heart failure: Secondary | ICD-10-CM | POA: Diagnosis not present

## 2020-07-26 LAB — BASIC METABOLIC PANEL
BUN/Creatinine Ratio: 23 (ref 12–28)
BUN: 52 mg/dL — ABNORMAL HIGH (ref 8–27)
CO2: 23 mmol/L (ref 20–29)
Calcium: 9.7 mg/dL (ref 8.7–10.3)
Chloride: 103 mmol/L (ref 96–106)
Creatinine, Ser: 2.31 mg/dL — ABNORMAL HIGH (ref 0.57–1.00)
GFR calc Af Amer: 22 mL/min/{1.73_m2} — ABNORMAL LOW (ref >59)
GFR calc non Af Amer: 19 mL/min/{1.73_m2} — ABNORMAL LOW (ref >59)
Glucose: 111 mg/dL — ABNORMAL HIGH (ref 65–99)
Potassium: 4.7 mmol/L (ref 3.5–5.2)
Sodium: 142 mmol/L (ref 134–144)

## 2020-07-28 ENCOUNTER — Telehealth: Payer: Self-pay | Admitting: Cardiovascular Disease

## 2020-07-28 NOTE — Telephone Encounter (Signed)
Patient returning call for lab results. 

## 2020-07-28 NOTE — Telephone Encounter (Signed)
Call patient with results

## 2020-08-22 ENCOUNTER — Encounter: Payer: Self-pay | Admitting: Podiatry

## 2020-08-22 ENCOUNTER — Other Ambulatory Visit: Payer: Self-pay

## 2020-08-22 ENCOUNTER — Ambulatory Visit (INDEPENDENT_AMBULATORY_CARE_PROVIDER_SITE_OTHER): Payer: Medicare Other | Admitting: Podiatry

## 2020-08-22 DIAGNOSIS — L97521 Non-pressure chronic ulcer of other part of left foot limited to breakdown of skin: Secondary | ICD-10-CM | POA: Diagnosis not present

## 2020-08-22 DIAGNOSIS — T466X5A Adverse effect of antihyperlipidemic and antiarteriosclerotic drugs, initial encounter: Secondary | ICD-10-CM | POA: Insufficient documentation

## 2020-08-22 DIAGNOSIS — E11621 Type 2 diabetes mellitus with foot ulcer: Secondary | ICD-10-CM | POA: Diagnosis not present

## 2020-08-22 DIAGNOSIS — M2042 Other hammer toe(s) (acquired), left foot: Secondary | ICD-10-CM | POA: Diagnosis not present

## 2020-08-22 DIAGNOSIS — Z860100 Personal history of colon polyps, unspecified: Secondary | ICD-10-CM | POA: Insufficient documentation

## 2020-08-22 DIAGNOSIS — I509 Heart failure, unspecified: Secondary | ICD-10-CM | POA: Insufficient documentation

## 2020-08-22 DIAGNOSIS — I5032 Chronic diastolic (congestive) heart failure: Secondary | ICD-10-CM | POA: Insufficient documentation

## 2020-08-22 DIAGNOSIS — N185 Chronic kidney disease, stage 5: Secondary | ICD-10-CM | POA: Insufficient documentation

## 2020-08-22 DIAGNOSIS — M79676 Pain in unspecified toe(s): Secondary | ICD-10-CM

## 2020-08-22 DIAGNOSIS — E782 Mixed hyperlipidemia: Secondary | ICD-10-CM | POA: Insufficient documentation

## 2020-08-22 DIAGNOSIS — E1121 Type 2 diabetes mellitus with diabetic nephropathy: Secondary | ICD-10-CM | POA: Diagnosis not present

## 2020-08-22 DIAGNOSIS — I130 Hypertensive heart and chronic kidney disease with heart failure and stage 1 through stage 4 chronic kidney disease, or unspecified chronic kidney disease: Secondary | ICD-10-CM | POA: Insufficient documentation

## 2020-08-22 DIAGNOSIS — R54 Age-related physical debility: Secondary | ICD-10-CM | POA: Insufficient documentation

## 2020-08-22 DIAGNOSIS — M1990 Unspecified osteoarthritis, unspecified site: Secondary | ICD-10-CM | POA: Insufficient documentation

## 2020-08-22 DIAGNOSIS — L98499 Non-pressure chronic ulcer of skin of other sites with unspecified severity: Secondary | ICD-10-CM | POA: Insufficient documentation

## 2020-08-22 DIAGNOSIS — M2041 Other hammer toe(s) (acquired), right foot: Secondary | ICD-10-CM

## 2020-08-22 DIAGNOSIS — D509 Iron deficiency anemia, unspecified: Secondary | ICD-10-CM | POA: Insufficient documentation

## 2020-08-22 DIAGNOSIS — B351 Tinea unguium: Secondary | ICD-10-CM

## 2020-08-22 DIAGNOSIS — N184 Chronic kidney disease, stage 4 (severe): Secondary | ICD-10-CM | POA: Insufficient documentation

## 2020-08-22 DIAGNOSIS — K219 Gastro-esophageal reflux disease without esophagitis: Secondary | ICD-10-CM | POA: Insufficient documentation

## 2020-08-22 DIAGNOSIS — Z85828 Personal history of other malignant neoplasm of skin: Secondary | ICD-10-CM | POA: Insufficient documentation

## 2020-08-22 DIAGNOSIS — I5030 Unspecified diastolic (congestive) heart failure: Secondary | ICD-10-CM | POA: Insufficient documentation

## 2020-08-22 MED ORDER — MUPIROCIN 2 % EX OINT
1.0000 | TOPICAL_OINTMENT | Freq: Every day | CUTANEOUS | 0 refills | Status: AC
Start: 2020-08-22 — End: 2020-09-21

## 2020-08-22 NOTE — Patient Instructions (Signed)
Purchase Skechers shoes with stretchable uppers and memory foam insoles from [employer] or [employer].  Wear toe crests daily. Apply every morning and remove every evening. This helps your toes stay straight when you stand up.    DRESSING CHANGES L 3rd toe:   PHARMACY SHOPPING LIST: 1. Saline or Wound Cleanser for cleaning wound 2. 2 x 2 inch sterile gauze for cleaning wound 3. Mupirocin Ointmenet (prescription)  A. IF DISPENSED, WEAR SURGICAL SHOE OR WALKING BOOT AT ALL TIMES.  B. IF PRESCRIBED ORAL ANTIBIOTICS, TAKE ALL MEDICATION AS PRESCRIBED UNTIL ALL ARE GONE.  C. IF DOCTOR HAS DESIGNATED NONWEIGHTBEARING STATUS, PLEASE ADHERE TO INSTRUCTIONS.   1. KEEP left foot DRY AT ALL TIMES!!!!  2. CLEANSE ULCER WITH SALINE OR WOUND CLEANSER.  3. DAB DRY WITH GAUZE SPONGE.  4. APPLY A LIGHT AMOUNT OF Mupirocin Ointment TO BASE OF ULCER.  5. APPLY OUTER DRESSING AS INSTRUCTED.  6. WEAR SURGICAL SHOE/BOOT DAILY AT ALL TIMES. IF SUPPLIED, WEAR HEEL PROTECTORS AT ALL TIMES WHEN IN BED.  7. DO NOT WALK BAREFOOT!!!  8.  IF YOU EXPERIENCE ANY FEVER, CHILLS, NIGHTSWEATS, NAUSEA OR VOMITING, ELEVATED OR LOW BLOOD SUGARS, REPORT TO EMERGENCY ROOM.  9. IF YOU EXPERIENCE INCREASED REDNESS, PAIN, SWELLING, DISCOLORATION, ODOR, PUS, DRAINAGE OR WARMTH OF YOUR FOOT, REPORT TO EMERGENCY ROOM.

## 2020-08-27 ENCOUNTER — Ambulatory Visit: Payer: Medicare Other | Admitting: Cardiovascular Disease

## 2020-08-27 DIAGNOSIS — H2513 Age-related nuclear cataract, bilateral: Secondary | ICD-10-CM | POA: Diagnosis not present

## 2020-08-27 DIAGNOSIS — H52203 Unspecified astigmatism, bilateral: Secondary | ICD-10-CM | POA: Diagnosis not present

## 2020-08-27 DIAGNOSIS — E119 Type 2 diabetes mellitus without complications: Secondary | ICD-10-CM | POA: Diagnosis not present

## 2020-08-28 NOTE — Progress Notes (Signed)
Subjective:  Patient ID: Tabitha Black, female    DOB: [birth year],  MRN: [id number]  87 y.o. female presents with preventative diabetic foot care and corn(s) left 3rd digit and painful thick toenails that are difficult to trim. Painful toenails interfere with ambulation. Aggravating factors include wearing enclosed shoe gear. Pain is relieved with periodic professional debridement. Painful corns are aggravated when weightbearing when wearing enclosed shoe gear. Pain is relieved with periodic professional debridement.   She is accompanied by her husband on today's visit. Husband states he has been trying to keep lesion on left 3rd toe clean. Tabitha Black states lesion is tender and is aggravated when wearing shoe gear.  PCP: [name], MD and last visit was: 06/03/2020.  Review of Systems: Negative except as noted in the HPI.  Past Medical History:  Diagnosis Date  . CKD (chronic kidney disease)   . Diabetes mellitus without complication ([place])    type II  . GERD (gastroesophageal reflux disease)   . High cholesterol   . Hypertension   . Osteoarthritis   . Skin cancer, basal cell    Past Surgical History:  Procedure Laterality Date  . ABDOMINAL HYSTERECTOMY N/A   . APPENDECTOMY N/A 1953  . BACK SURGERY    . CHOLECYSTECTOMY    . COLONOSCOPY N/A 02/22/2008  . TOTAL KNEE ARTHROPLASTY Right   . UMBILICAL HERNIA REPAIR N/A 04/02/2015   Procedure: LAPAROSCOPIC UMBILICAL HERNIA REPAIR WITH VENTRALIGHT MESH;  Surgeon: Ralene Ok, MD;  Location: [place];  Service: General;  Laterality: N/A;   Patient Active Problem List   Diagnosis Date Noted  . Adverse effect of antihyperlipidemic and antiarteriosclerotic drugs, initial encounter 08/22/2020  . Age-related physical debility 08/22/2020  . Chronic congestive heart failure ([place]) 08/22/2020  . Chronic kidney disease, stage 4 (severe) ([place]) 08/22/2020  . Diabetic renal disease ([place]) 08/22/2020  . Gastroesophageal reflux disease 08/22/2020   . History of malignant neoplasm of skin 08/22/2020  . Hypertensive heart and chronic kidney disease with heart failure and stage 1 through stage 4 chronic kidney disease, or unspecified chronic kidney disease ([place]) 08/22/2020  . Iron deficiency anemia 08/22/2020  . Mixed hyperlipidemia 08/22/2020  . Morbid obesity ([place]) 08/22/2020  . Osteoarthritis 08/22/2020  . Personal history of colonic polyps 08/22/2020  . Skin ulcer ([place]) 08/22/2020  . Cervical spondylosis with radiculopathy 07/26/2017  . Small bowel obstruction ([place]) 04/01/2015    Current Outpatient Medications:  .  mupirocin ointment (BACTROBAN) 2 %, Apply 1 application topically daily. Apply to left 3rd digit once daily., Disp: 30 g, Rfl: 0 .  aspirin EC 81 MG tablet, Take 81 mg by mouth every other day. , Disp: , Rfl:  .  Cholecalciferol (VITAMIN D3) 1.25 MG (50000 UT) TABS, Take 1 tablet by mouth daily., Disp: , Rfl:  .  cyclobenzaprine (FLEXERIL) 10 MG tablet, Take 5 mg by mouth 3 (three) times daily as needed for muscle spasms. , Disp: , Rfl:  .  doxazosin (CARDURA) 4 MG tablet, Take 4 mg by mouth daily., Disp: , Rfl:  .  fenofibrate 160 MG tablet, Take 160 mg by mouth every evening. , Disp: , Rfl:  .  gemfibrozil (LOPID) 600 MG tablet, Take by mouth., Disp: , Rfl:  .  hydrALAZINE (APRESOLINE) 50 MG tablet, TAKE 1 TABLET BY MOUTH THREE TIMES A DAY, Disp: 270 tablet, Rfl: 1 .  HYDROcodone-acetaminophen (NORCO/VICODIN) 5-325 MG tablet, Take 1 tablet by mouth daily., Disp: , Rfl:  .  Iron-FA-B Cmp-C-Biot-Probiotic (FUSION PLUS)  CAPS, Take 1 capsule by mouth daily., Disp: , Rfl:  .  metoprolol succinate (TOPROL-XL) 100 MG 24 hr tablet, Take 100 mg by mouth every evening. Take with or immediately following a meal., Disp: , Rfl:  .  Multiple Vitamins-Minerals (HAIR SKIN AND NAILS FORMULA) TABS, 1 tablet, Disp: , Rfl:  .  torsemide (DEMADEX) 20 MG tablet, Take 1 tablet (20 mg total) by mouth 2 (two) times daily., Disp: 180 tablet,  Rfl: 3 Allergies  Allergen Reactions  . Other Swelling    Topical mycin, unsure of which one, caused opthalmic swelling  . Penicillins Shortness Of Breath, Itching, Swelling and Rash  . Lovastatin Other (See Comments)    Weakness and myalgias, maybe to statins?  . Lisinopril Other (See Comments)    Hyperkalemia   . Neomycin Sulfate [Neomycin] Other (See Comments)    Redness and burning in face  . Niaspan [Niacin] Hives  . Rofecoxib Other (See Comments)  . Valsartan Other (See Comments)    Hyperkalemia   . Vytorin [Ezetimibe-Simvastatin] Other (See Comments)    myalgias  . Zetia [Ezetimibe] Other (See Comments)    Leg pain   Social History   Tobacco Use  Smoking Status Never Smoker  Smokeless Tobacco Never Used    Objective:  There were no vitals filed for this visit. Constitutional Patient is a pleasant 87 y.o. Caucasian female in NAD. AAO x 3.  Vascular Capillary refill time to digits immediate b/l. Palpable pedal pulses b/l LE. Pedal hair sparse. Lower extremity skin temperature gradient within normal limits. No pain with calf compression b/l. No cyanosis or clubbing noted.  Neurologic Normal speech. Protective sensation intact 5/5 intact bilaterally with 10g monofilament b/l. Vibratory sensation intact b/l.  Dermatologic Pedal skin with normal turgor, texture and tone bilaterally. No interdigital macerations bilaterally. Toenails 1-5 b/l elongated, discolored, dystrophic, thickened, crumbly with subungual debris and tenderness to dorsal palpation. Ulceration located distal tip left 3rd digit.  Predebridement measurements carried out today of 0.3 cm in diameter.  No redness, streaking or lymphangitis noted.  No probing to bone, no undermining, no active pus or purulence noted. Postdebridement measurements today are: 0.5 cm in diameter and 0.1 cm in depth.  No redness, streaking or lymphangitis noted.  No probing to bone, no undermining, no active purulence noted.  No deep  abscess, no erythema, no edema, no cellulitis, no odor was encountered.  Orthopedic: Normal muscle strength 5/5 to all lower extremity muscle groups bilaterally. No pain crepitus or joint limitation noted with ROM b/l. Hammertoes noted to the 2-5 bilaterally.   No flowsheet data found.     Assessment:   1. Pain due to onychomycosis of toenail   2. Diabetic ulcer of toe of left foot associated with type 2 diabetes mellitus, limited to breakdown of skin ([place])   3. Acquired hammertoes of both feet   4. Diabetic nephropathy associated with type 2 diabetes mellitus ([place])    Plan:  Patient was evaluated and treated and all questions answered.  Onychomycosis with pain -Nails palliatively debridement as below. -Educated on self-care  Procedure: Nail Debridement Rationale: Pain Type of Debridement: manual, sharp debridement. Instrumentation: Nail nipper, rotary burr. Number of Nails: 10  -Examined patient. -Patient to continue soft, supportive shoe gear daily. -Rx written for Mupirocin Ointment. She is to apply to left 3rd digit once daily. -Ulcer was debrided and reactive hyperkeratoses and necrotic tissue to level of subcutaneous tissue which was resected to the level of bleeding or viable tissue.  Ulcer was cleansed with wound cleanser. Triple antibiotic ointment was applied to base of wound with light dressing. Patient was given instructions on offloading and dressing change/aftercare and was instructed to call immediately if any signs or symptoms of infection arise. Patient instructed to report to emergency department with worsening appearance of ulcer/toe/foot, increased pain, foul odor, increased redness, swelling, drainage, fever, chills, nightsweats, nausea, vomiting, increased blood sugar. Written instructions dispensed to follow for daily wound care. Dispensed toe crest for assistance in getting left 3rd digit in more rectus position. Apply every morning and remove every  evening. -Toenails 1-5 b/l were debrided in length and girth with sterile nail nippers and dremel without iatrogenic bleeding.  -Patient to report any pedal injuries to medical professional immediately. -Patient/POA to call should there be question/concern in the interim.  Return in about 2 weeks (around 09/05/2020).  Marzetta Board, DPM

## 2020-09-05 ENCOUNTER — Other Ambulatory Visit: Payer: Self-pay

## 2020-09-05 ENCOUNTER — Ambulatory Visit (INDEPENDENT_AMBULATORY_CARE_PROVIDER_SITE_OTHER): Payer: Medicare Other | Admitting: Podiatry

## 2020-09-05 DIAGNOSIS — M2042 Other hammer toe(s) (acquired), left foot: Secondary | ICD-10-CM | POA: Diagnosis not present

## 2020-09-05 DIAGNOSIS — Z8631 Personal history of diabetic foot ulcer: Secondary | ICD-10-CM

## 2020-09-05 DIAGNOSIS — M2041 Other hammer toe(s) (acquired), right foot: Secondary | ICD-10-CM | POA: Diagnosis not present

## 2020-09-05 DIAGNOSIS — E1121 Type 2 diabetes mellitus with diabetic nephropathy: Secondary | ICD-10-CM

## 2020-09-08 NOTE — Progress Notes (Unsigned)
Cardiology Office Note    Date:  09/11/2020   ID:  Tabitha Black, Tabitha Black 1933/02/19, MRN [id number]  PCP:  [name], MD  Cardiologist:  Dr. Johnsie Cancel  CC: follow up - diastolic CHF    HPI:  87 y.o.  With a history of HTN, HLD , DM, CDK and morbid obesity ? Diastolic dysfunction. She has had worsening dyspnea and renal failure. Has had elevated Cr with both ACE/ARB Echo reviewed from 11/18/16 EF 36-62% grade 2 diastolic dysfunction with estimated PA of 48 mmHg. ? 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 CKD (chronic kidney disease)   . Diabetes mellitus without complication ([place])    type II  . GERD (gastroesophageal reflux disease)   . High cholesterol   . Hypertension   . Osteoarthritis   . Skin cancer, basal cell     Past Surgical History:  Procedure Laterality Date  . ABDOMINAL HYSTERECTOMY N/A   . APPENDECTOMY N/A 1953  . BACK SURGERY    . CHOLECYSTECTOMY    . COLONOSCOPY N/A 02/22/2008  . TOTAL KNEE ARTHROPLASTY Right   . UMBILICAL HERNIA REPAIR N/A 04/02/2015   Procedure: LAPAROSCOPIC UMBILICAL HERNIA REPAIR WITH VENTRALIGHT MESH;  Surgeon: Ralene Ok, MD;  Location: [facility];  Service: General;  Laterality: N/A;    Current Medications: Outpatient Medications Prior to Visit  Medication Sig Dispense Refill  . aspirin EC 81 MG tablet Take 81  mg by mouth every other day.     . Cholecalciferol (VITAMIN D3) 1.25 MG (50000 UT) TABS Take 1 tablet by mouth daily.    . cyclobenzaprine (FLEXERIL) 10 MG tablet Take 5 mg by mouth 3 (three) times daily as needed for muscle spasms.     Marland Kitchen doxazosin (CARDURA) 4 MG tablet Take 4 mg by mouth daily.    . fenofibrate 160 MG tablet Take 160 mg by mouth every evening.     Marland Kitchen gemfibrozil (LOPID) 600 MG tablet Take by mouth.    Marland Kitchen HYDROcodone-acetaminophen (NORCO/VICODIN) 5-325 MG tablet Take 1 tablet by mouth daily.    . Iron-FA-B Cmp-C-Biot-Probiotic (FUSION PLUS) CAPS Take 1 capsule by mouth daily.    . metoprolol succinate (TOPROL-XL) 100 MG 24 hr tablet Take 100 mg by mouth every evening. Take with or immediately following a meal.    . Multiple Vitamins-Minerals (HAIR SKIN AND NAILS FORMULA) TABS 1 tablet    . mupirocin ointment (BACTROBAN) 2 % Apply 1 application topically daily. Apply to left 3rd digit once daily. 30 g 0  . torsemide (DEMADEX) 20 MG tablet Take 1 tablet (20 mg total) by mouth 2 (two) times daily. 180 tablet 3  . hydrALAZINE (APRESOLINE) 50 MG tablet TAKE 1 TABLET BY MOUTH THREE TIMES A DAY 270 tablet 1  No facility-administered medications prior to visit.     Allergies:   Other, Penicillins, Lovastatin, Lisinopril, Neomycin sulfate [neomycin], Niaspan [niacin], Rofecoxib, Valsartan, Vytorin [ezetimibe-simvastatin], and Zetia [ezetimibe]   Social History   Socioeconomic History  . Marital status: Married    Spouse name: Not on file  . Number of children: Not on file  . Years of education: Not on file  . Highest education level: Not on file  Occupational History  . Not on file  Tobacco Use  . Smoking status: Never Smoker  . Smokeless tobacco: Never Used  Substance and Sexual Activity  . Alcohol use: Yes    Comment: twice a year  . Drug use: No  . Sexual activity: Not on file  Other Topics Concern  . Not on file  Social History Narrative  . Not on file   Social  Determinants of Health   Financial Resource Strain: Not on file  Food Insecurity: Not on file  Transportation Needs: Not on file  Physical Activity: Not on file  Stress: Not on file  Social Connections: Not on file     Family History:  The patient's family history includes Breast cancer in her sister; Heart attack in her father; Heart failure in her mother; Hypertension in her mother; Prostate cancer in her father.     ROS:   Please see the history of present illness.    ROS All other systems reviewed and are negative.   PHYSICAL EXAM:   VS:  BP (!) 142/60   Pulse 82   Ht 5' 5.5" (1.664 m)   Wt 96.6 kg   SpO2 98%   BMI 34.91 kg/m     Affect appropriate Obese female  HEENT: normal Neck supple with no adenopathy JVP normal no bruits no thyromegaly Lungs clear with no wheezing and good diaphragmatic motion Heart:  S1/S2 no murmur, no rub, gallop or click PMI normal Abdomen: benighn, BS positve, no tenderness, no AAA no bruit.  No HSM or HJR Distal pulses intact with no bruits Plus 2 bilateral edema Neuro non-focal Skin warm and dry No muscular weakness    Wt Readings from Last 3 Encounters:  09/11/20 96.6 kg  07/10/20 97.8 kg  06/22/19 98 kg      Studies/Labs Reviewed:   EKG:     06/22/19 SR rate 66 chronic biphasic inferior lateral T wave changes 09/11/2020 SR rate 82 Inferolateral T wave changes chronic   Recent Labs: 07/25/2020: BUN 52; Creatinine, Ser 2.31; Potassium 4.7; Sodium 142   Lipid Panel No results found for: CHOL, TRIG, HDL, CHOLHDL, VLDL, LDLCALC, LDLDIRECT  Additional studies/ records that were reviewed today include:  2D ECHO: 11/18/2016 LV EF: 60% - 65% Study Conclusions - Left ventricle: The cavity size was normal. There appeared to asymmetric hypertrophy of the LV apex. Systolic function was normal. The estimated ejection fraction was in the range of 60% to 65%. Wall motion was normal; there were no regional wall motion  abnormalities. Features are consistent with a pseudonormal left ventricular filling pattern, with concomitant abnormal relaxation and increased filling pressure (grade 2 diastolic dysfunction). - Aortic valve: There was no stenosis. - Mitral valve: Mildly calcified annulus. There was trivial regurgitation. - Left atrium: The atrium was moderately dilated. - Right ventricle: The cavity size was normal. Systolic function was normal. - Tricuspid valve: Peak RV-RA gradient (S): 45 mm Hg. - Pulmonary arteries: PA peak pressure: 48 mm Hg (S). - Inferior vena cava: The vessel was normal in size.  The respirophasic diameter changes were in the normal range (= 50%), consistent with normal central venous pressure. Impressions: - Normal LV size with asymmetric hypertrophy of the LV apex (spade-shaped ventricle). This is suggestive of apical hypertrophic cardiomyopathy. EF 60-65%. Normal RV size and systolic function. No significant valvular abnormalities. Mild pulmonary hypertension.    ASSESSMENT & PLAN:   Acute on chronic diastolic CHF:  Not clear that her edema has anything to due with heart Needs Better BP control low sodium diet Torsemide started 07/10/20   Some improvement   Dyspnea:  Weight loss and control of BP most important    Abnormal ECHO: ? Apical HOCM  cannot have MRI with gadolinium due to CRF Only Rx Needed is better BP control and monitoring of renal failure  HTN:  Unable to take hydralazine Unable to use ACE/ARB/Amlodipoine Add Procardia 30 mg XL F/u primary and renal   HLD: previously did not tolerate Lovastatin. On Co Q 10 and lipids have been moderately well controlled.  DMT2: HgA1c 6.1 most recently. Not on any medications for his  CKD: now followed by Dr. Posey Pronto. [place] no RAS  Baseline Cr around 2.3 07/25/20    Aldactone d/c Anemia requiring iron injections    F/U primary / renal Procardia XL 30 mg   F/U in 6 months     Jenkins Rouge

## 2020-09-09 ENCOUNTER — Encounter: Payer: Self-pay | Admitting: Podiatry

## 2020-09-09 NOTE — Progress Notes (Signed)
Subjective:  Patient ID: Tabitha Black, female    DOB: 15-Jan-1933,  MRN: [id number]  87 y.o. female presents with follow up diabetic ulcer distal tip left 3rd toe   She is accompanied by her husband on today's visit. Husband states he has been performing dressing changes as instructed and applying toe crest. Tabitha Black states toe was better, but has become sore again after two weeks. She is concerned it is red and swollen. Denies any fever, chills, night sweats, nausea or vomiting.  Her husband is accompanying her on today's visit.  PCP: Tabitha Arabian, MD and last visit was: 09/01/2020.  Review of Systems: Negative except as noted in the HPI.   Allergies  Allergen Reactions  . Other Swelling    Topical mycin, unsure of which one, caused opthalmic swelling  . Penicillins Shortness Of Breath, Itching, Swelling and Rash  . Lovastatin Other (See Comments)    Weakness and myalgias, maybe to statins?  . Lisinopril Other (See Comments)    Hyperkalemia   . Neomycin Sulfate [Neomycin] Other (See Comments)    Redness and burning in face  . Niaspan [Niacin] Hives  . Rofecoxib Other (See Comments)  . Valsartan Other (See Comments)    Hyperkalemia   . Vytorin [Ezetimibe-Simvastatin] Other (See Comments)    myalgias  . Zetia [Ezetimibe] Other (See Comments)    Leg pain   Objective:  There were no vitals filed for this visit. Constitutional Patient is a pleasant 87 y.o. Caucasian female in NAD. AAO x 3.  Vascular Capillary refill time to digits immediate b/l. Palpable pedal pulses b/l LE. Pedal hair sparse. Lower extremity skin temperature gradient within normal limits. No pain with calf compression b/l. No cyanosis or clubbing noted.  Neurologic Normal speech. Protective sensation intact 5/5 intact bilaterally with 10g monofilament b/l. Vibratory sensation intact b/l.  Dermatologic Pedal skin with normal turgor, texture and tone bilaterally. No interdigital macerations bilaterally.  Toenails 1-5 b/l elongated, discolored, dystrophic, thickened, crumbly with subungual debris and tenderness to dorsal palpation.  Ulceration located distal tip left 3rd digit has nearly resolved.  I do not see any redness nor swelling of digit, though tip is tender to touch due to hyperkeratosis which has developed again. No deep abscess, no erythema, no edema, no cellulitis, no odor was encountered. She does have porokeratosis developing again and it is tender when pressure applied.  Orthopedic: Normal muscle strength 5/5 to all lower extremity muscle groups bilaterally. No pain crepitus or joint limitation noted with ROM b/l. Hammertoes noted to the 2-5 bilaterally.    Assessment:   1. Healed diabetic ulcer of foot   2. Acquired hammertoe of great toes of both feet   3. Diabetic nephropathy associated with type 2 diabetes mellitus ([place])    Plan:  -Examined patient. -Patient to continue soft, supportive shoe gear daily. -Ulcer was debrided and reactive hyperkeratoses to level of dermis  Ulcer was cleansed with wound cleanser. Iodosorb Gel was applied to base of wound with light dressing. Patient was given instructions on offloading and dressing change/aftercare and was instructed to call immediately if any signs or symptoms of infection arise. Patient instructed to report to emergency department with worsening appearance of ulcer/toe/foot, increased pain, foul odor, increased redness, swelling, drainage, fever, chills, nightsweats, nausea, vomiting, increased blood sugar. Written instructions dispensed to follow for daily wound care. Patient noted improvement in pain symptoms when weightbearing after today's debridement. -Continue once daily dressing changes with Mupirocin Ointment. She is to apply to  left 3rd digit once daily. Continue toe crest for assistance in getting left 3rd digit in more rectus position. Apply every morning and remove every evening. -Discussed flexor tenotomy as an option with  patient and her husband to alleviate her pan. Will schedule her with Dr. Milinda Pointer for evaluation for procedure. -Patient to report any pedal injuries to medical professional immediately. -Patient/POA to call should there be question/concern in the interim.  Return in about 3 months (around 12/03/2020).  Marzetta Board, DPM

## 2020-09-11 ENCOUNTER — Encounter: Payer: Self-pay | Admitting: Cardiovascular Disease

## 2020-09-11 ENCOUNTER — Ambulatory Visit (INDEPENDENT_AMBULATORY_CARE_PROVIDER_SITE_OTHER): Payer: Medicare Other | Admitting: Cardiovascular Disease

## 2020-09-11 ENCOUNTER — Other Ambulatory Visit: Payer: Self-pay

## 2020-09-11 VITALS — BP 142/60 | HR 82 | Ht 65.5 in | Wt 213.0 lb

## 2020-09-11 DIAGNOSIS — N184 Chronic kidney disease, stage 4 (severe): Secondary | ICD-10-CM

## 2020-09-11 DIAGNOSIS — I890 Lymphedema, not elsewhere classified: Secondary | ICD-10-CM

## 2020-09-11 DIAGNOSIS — I10 Essential (primary) hypertension: Secondary | ICD-10-CM

## 2020-09-11 MED ORDER — NIFEDIPINE ER OSMOTIC RELEASE 30 MG PO TB24: 30.0000 mg | ORAL_TABLET | Freq: Every day | ORAL | 3 refills | Status: DC

## 2020-09-11 MED ORDER — AMLODIPINE BESYLATE 10 MG PO TABS: 10.0000 mg | ORAL_TABLET | Freq: Every day | ORAL | 3 refills | Status: DC

## 2020-09-11 NOTE — Patient Instructions (Addendum)
Medication Instructions:  Your physician has recommended you make the following change in your medication:  1- STOP Hydralazine 2- START Nifedipine (procardia) 30 mg by mouth daily  *If you need a refill on your cardiac medications before your next appointment, please call your pharmacy*   Lab Work: If you have labs (blood work) drawn today and your tests are completely normal, you will receive your results only by: Marland Kitchen MyChart Message (if you have MyChart) OR . A paper copy in the mail If you have any lab test that is abnormal or we need to change your treatment, we will call you to review the results.  Follow-Up: At [facility], you and your health needs are our priority.  As part of our continuing mission to provide you with exceptional heart care, we have created designated Provider Care Teams.  These Care Teams include your primary Cardiologist (physician) and Advanced Practice Providers (APPs -  Physician Assistants and Nurse Practitioners) who all work together to provide you with the care you need, when you need it.  We recommend signing up for the patient portal called "MyChart".  Sign up information is provided on this After Visit Summary.  MyChart is used to connect with patients for Virtual Visits (Telemedicine).  Patients are able to view lab/test results, encounter notes, upcoming appointments, etc.  Non-urgent messages can be sent to your provider as well.   To learn more about what you can do with MyChart, go to [email or web address].    Your next appointment:   6 month(s)  The format for your next appointment:   In Person  Provider:   You may see Jenkins Rouge, MD or one of the following Advanced Practice Providers on your designated Care Team:    Kathyrn Drown, NP

## 2020-09-16 ENCOUNTER — Ambulatory Visit (INDEPENDENT_AMBULATORY_CARE_PROVIDER_SITE_OTHER): Payer: Medicare Other | Admitting: Podiatry

## 2020-09-16 ENCOUNTER — Other Ambulatory Visit: Payer: Self-pay

## 2020-09-16 ENCOUNTER — Encounter: Payer: Self-pay | Admitting: Podiatry

## 2020-09-16 DIAGNOSIS — M2042 Other hammer toe(s) (acquired), left foot: Secondary | ICD-10-CM

## 2020-09-16 DIAGNOSIS — M205X9 Other deformities of toe(s) (acquired), unspecified foot: Secondary | ICD-10-CM | POA: Diagnosis not present

## 2020-09-16 NOTE — Patient Instructions (Addendum)
Leave bandage in place and dry for 4 days, then remove. You may wash foot normally after removal of bandage. DO NOT SOAK FOOT! Dry completely afterwards and may use a bandaid over incision if needed. We will follow up with you in 2 weeks for recheck.

## 2020-09-16 NOTE — Progress Notes (Signed)
She presents today for a flexor tenotomy to help reduce the ulcerative lesion to the distal lateral aspect of the third toe left referred by Dr.Galaway.  Objective: Vital signs are stable alert oriented x3 pulses are palpable.  Hammertoe deformity third left there is resulting in reactive hyperkeratotic tissue and ultimate breakdown and ulceration to the plantar lateral aspect of the tuft of the third digit left foot.  There is no purulence no malodor there is no cellulitic process no signs of infection.  Assessment: Hammertoe deformity flexor contraction resulting in ulceration third toe left.  Plan: After informed consent was signed the toe was injected with local anesthetic proximally 50-50 mixture of Marcaine plain lidocaine with epinephrine total total 2 cc was utilized.  I was then prepped and draped in sterile sterile fashion.  Utilizing a 18-gauge needle attention was directed to the plantar aspect of the third toe where a small stab incision was made with a sweeping type motion to transect the long flexor tendon just distal to the level of the PIPJ.  The area was lavaged with alcohol and a dry sterile compressive dressing with small amount of antibiotic ointment was placed she is placed in a Darco shoe she is to leave this dressing on for 1 week unless the tip of the toe is becoming painful or infected she will notify us at that point.  I will recheck her in 1 week

## 2020-09-30 ENCOUNTER — Other Ambulatory Visit: Payer: Self-pay

## 2020-09-30 ENCOUNTER — Encounter: Payer: Self-pay | Admitting: Podiatry

## 2020-09-30 ENCOUNTER — Ambulatory Visit (INDEPENDENT_AMBULATORY_CARE_PROVIDER_SITE_OTHER): Payer: Medicare Other | Admitting: Podiatry

## 2020-09-30 DIAGNOSIS — Z9889 Other specified postprocedural states: Secondary | ICD-10-CM

## 2020-09-30 DIAGNOSIS — M2042 Other hammer toe(s) (acquired), left foot: Secondary | ICD-10-CM

## 2020-09-30 DIAGNOSIS — M205X9 Other deformities of toe(s) (acquired), unspecified foot: Secondary | ICD-10-CM

## 2020-09-30 NOTE — Progress Notes (Signed)
She presents today for follow-up of her release of her third toes left foot.  She states that it does not hurt at times feels so much better and there is no sore on the end of it anymore.  Objective: Vital signs are stable alert oriented x3 toe is rectus no erythema edema cellulitis drainage odor debrided the the callus once again to the end of the tendon other than that she is looking very good.  Assessment: Well-healing surgical toe.  Plan: Follow-up with me on an as-needed basis.

## 2020-10-08 DIAGNOSIS — N184 Chronic kidney disease, stage 4 (severe): Secondary | ICD-10-CM | POA: Diagnosis not present

## 2020-10-17 DIAGNOSIS — N184 Chronic kidney disease, stage 4 (severe): Secondary | ICD-10-CM | POA: Diagnosis not present

## 2020-10-17 DIAGNOSIS — N2581 Secondary hyperparathyroidism of renal origin: Secondary | ICD-10-CM | POA: Diagnosis not present

## 2020-10-17 DIAGNOSIS — D631 Anemia in chronic kidney disease: Secondary | ICD-10-CM | POA: Diagnosis not present

## 2020-10-17 DIAGNOSIS — E1122 Type 2 diabetes mellitus with diabetic chronic kidney disease: Secondary | ICD-10-CM | POA: Diagnosis not present

## 2020-10-17 DIAGNOSIS — I129 Hypertensive chronic kidney disease with stage 1 through stage 4 chronic kidney disease, or unspecified chronic kidney disease: Secondary | ICD-10-CM | POA: Diagnosis not present

## 2020-10-22 DIAGNOSIS — L720 Epidermal cyst: Secondary | ICD-10-CM | POA: Diagnosis not present

## 2020-11-26 DIAGNOSIS — I130 Hypertensive heart and chronic kidney disease with heart failure and stage 1 through stage 4 chronic kidney disease, or unspecified chronic kidney disease: Secondary | ICD-10-CM | POA: Diagnosis not present

## 2020-11-26 DIAGNOSIS — I5032 Chronic diastolic (congestive) heart failure: Secondary | ICD-10-CM | POA: Diagnosis not present

## 2020-11-26 DIAGNOSIS — D649 Anemia, unspecified: Secondary | ICD-10-CM | POA: Diagnosis not present

## 2020-11-26 DIAGNOSIS — N184 Chronic kidney disease, stage 4 (severe): Secondary | ICD-10-CM | POA: Diagnosis not present

## 2020-11-26 DIAGNOSIS — R300 Dysuria: Secondary | ICD-10-CM | POA: Diagnosis not present

## 2020-11-26 DIAGNOSIS — E782 Mixed hyperlipidemia: Secondary | ICD-10-CM | POA: Diagnosis not present

## 2020-11-26 DIAGNOSIS — R609 Edema, unspecified: Secondary | ICD-10-CM | POA: Diagnosis not present

## 2020-11-26 DIAGNOSIS — R54 Age-related physical debility: Secondary | ICD-10-CM | POA: Diagnosis not present

## 2020-11-26 DIAGNOSIS — E1122 Type 2 diabetes mellitus with diabetic chronic kidney disease: Secondary | ICD-10-CM | POA: Diagnosis not present

## 2020-11-26 DIAGNOSIS — Z1389 Encounter for screening for other disorder: Secondary | ICD-10-CM | POA: Diagnosis not present

## 2020-11-26 DIAGNOSIS — M1990 Unspecified osteoarthritis, unspecified site: Secondary | ICD-10-CM | POA: Diagnosis not present

## 2020-11-26 DIAGNOSIS — Z0000 Encounter for general adult medical examination without abnormal findings: Secondary | ICD-10-CM | POA: Diagnosis not present

## 2020-12-17 ENCOUNTER — Ambulatory Visit (INDEPENDENT_AMBULATORY_CARE_PROVIDER_SITE_OTHER): Payer: Medicare Other | Admitting: Podiatry

## 2020-12-17 ENCOUNTER — Other Ambulatory Visit: Payer: Self-pay

## 2020-12-17 DIAGNOSIS — B351 Tinea unguium: Secondary | ICD-10-CM

## 2020-12-17 DIAGNOSIS — M79676 Pain in unspecified toe(s): Secondary | ICD-10-CM | POA: Diagnosis not present

## 2020-12-24 ENCOUNTER — Encounter: Payer: Self-pay | Admitting: Podiatry

## 2020-12-24 NOTE — Progress Notes (Signed)
  Subjective:  Patient ID: Tabitha Black, female    DOB: 03-13-1933,  MRN: [id number]  87 y.o. female presents with preventative diabetic foot care and corn(s) left foot and painful thick toenails that are difficult to trim. Painful toenails interfere with ambulation. Aggravating factors include wearing enclosed shoe gear. Pain is relieved with periodic professional debridement. Painful corns are aggravated when weightbearing when wearing enclosed shoe gear. Pain is relieved with periodic professional debridement..    She had flexor tenotomy performed by Dr. Milinda Pointer on left 3rd digit and notes her pain has resolved. Her husband is present during today's visit.  PCP: [name], MD and last visit was: 3 weeks ago.  She notes no other problems on today's visit.  Review of Systems: Negative except as noted in the HPI.   Allergies  Allergen Reactions  . Other Swelling    Topical mycin, unsure of which one, caused opthalmic swelling  . Penicillins Shortness Of Breath, Itching, Swelling and Rash  . Lovastatin Other (See Comments)    Weakness and myalgias, maybe to statins?  . Lisinopril Other (See Comments)    Hyperkalemia   . Neomycin Sulfate [Neomycin] Other (See Comments)    Redness and burning in face  . Niaspan [Niacin] Hives  . Rofecoxib Other (See Comments)  . Valsartan Other (See Comments)    Hyperkalemia   . Vytorin [Ezetimibe-Simvastatin] Other (See Comments)    myalgias  . Zetia [Ezetimibe] Other (See Comments)    Leg pain    Objective:  There were no vitals filed for this visit. Constitutional Patient is a pleasant 87 y.o. Caucasian female in NAD. AAO x 3.  Vascular Neurovascular status unchanged b/l lower extremities. Capillary refill time to digits immediate b/l. Palpable pedal pulses b/l LE. Pedal hair sparse. Lower extremity skin temperature gradient within normal limits. No pain with calf compression b/l. No edema noted b/l lower extremities. No cyanosis or  clubbing noted.  Neurologic Normal speech. Protective sensation intact 5/5 intact bilaterally with 10g monofilament b/l.  Dermatologic Pedal skin with normal turgor, texture and tone bilaterally. No open wounds bilaterally. No interdigital macerations bilaterally. Toenails 1-5 b/l elongated, discolored, dystrophic, thickened, crumbly with subungual debris and tenderness to dorsal palpation. Resolving hyperkeratosis distal tip left 3rd digit.  Orthopedic: Normal muscle strength 5/5 to all lower extremity muscle groups bilaterally. No pain crepitus or joint limitation noted with ROM b/l. Hammertoe(s) noted to the 2-5 bilaterally.   Assessment:   1. Pain due to onychomycosis of toenail    Plan:  Patient was evaluated and treated and all questions answered.  Onychomycosis with pain -Nails palliatively debridement as below. -Educated on self-care  Procedure: Nail Debridement Rationale: Pain Type of Debridement: manual, sharp debridement. Instrumentation: Nail nipper, rotary burr. Number of Nails: 10  -Examined patient. -Continue diabetic foot care principles. -Patient to continue soft, supportive shoe gear daily. -Toenails 1-5 b/l were debrided in length and girth with sterile nail nippers and dremel without iatrogenic bleeding.  -Patient to report any pedal injuries to medical professional immediately. -Patient/POA to call should there be question/concern in the interim.  Return in about 3 months (around 03/19/2021).  Marzetta Board, DPM

## 2021-01-06 DIAGNOSIS — M25562 Pain in left knee: Secondary | ICD-10-CM | POA: Diagnosis not present

## 2021-01-06 DIAGNOSIS — M170 Bilateral primary osteoarthritis of knee: Secondary | ICD-10-CM | POA: Diagnosis not present

## 2021-02-11 DIAGNOSIS — M1712 Unilateral primary osteoarthritis, left knee: Secondary | ICD-10-CM | POA: Diagnosis not present

## 2021-02-18 DIAGNOSIS — M1712 Unilateral primary osteoarthritis, left knee: Secondary | ICD-10-CM | POA: Diagnosis not present

## 2021-02-18 DIAGNOSIS — N184 Chronic kidney disease, stage 4 (severe): Secondary | ICD-10-CM | POA: Diagnosis not present

## 2021-02-25 DIAGNOSIS — N184 Chronic kidney disease, stage 4 (severe): Secondary | ICD-10-CM | POA: Diagnosis not present

## 2021-02-25 DIAGNOSIS — E1122 Type 2 diabetes mellitus with diabetic chronic kidney disease: Secondary | ICD-10-CM | POA: Diagnosis not present

## 2021-02-25 DIAGNOSIS — I129 Hypertensive chronic kidney disease with stage 1 through stage 4 chronic kidney disease, or unspecified chronic kidney disease: Secondary | ICD-10-CM | POA: Diagnosis not present

## 2021-02-25 DIAGNOSIS — N2581 Secondary hyperparathyroidism of renal origin: Secondary | ICD-10-CM | POA: Diagnosis not present

## 2021-02-25 DIAGNOSIS — D631 Anemia in chronic kidney disease: Secondary | ICD-10-CM | POA: Diagnosis not present

## 2021-02-25 DIAGNOSIS — M1712 Unilateral primary osteoarthritis, left knee: Secondary | ICD-10-CM | POA: Diagnosis not present

## 2021-03-04 DIAGNOSIS — C44311 Basal cell carcinoma of skin of nose: Secondary | ICD-10-CM | POA: Diagnosis not present

## 2021-03-04 DIAGNOSIS — L738 Other specified follicular disorders: Secondary | ICD-10-CM | POA: Diagnosis not present

## 2021-03-04 DIAGNOSIS — L720 Epidermal cyst: Secondary | ICD-10-CM | POA: Diagnosis not present

## 2021-03-06 NOTE — Progress Notes (Signed)
Cardiology Office Note    Date:  03/11/2021   ID:  Belynda, Pagaduan 1933/04/13, MRN [id number]  PCP:  [name], MD  Cardiologist:  Dr. Johnsie Cancel  CC: follow up - diastolic CHF    HPI:  87 y.o.  With a history of HTN, HLD , DM, CDK and morbid obesity ? Diastolic dysfunction. She has had worsening dyspnea and renal failure. Has had elevated Cr with both ACE/ARB Echo reviewed from 11/18/16 EF 67-34% grade 2 diastolic dysfunction with estimated PA of 48 mmHg. ? 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   12/20021 started on torsemide instead of lasix/HCTZ Cr stable around 2.3   Her edema is more lymphedema It is not related to diastolic CHF She feels foggy headed with hydralazine and does not comply with all doses Started on Procardia 08/2020   Husband had a "spinal stroke" in March with some difficulty walking She has a lot of arthritic pain In left knee but with age, renal failure, DM not an operative candidate Has had right knee done   Past Medical History:  Diagnosis Date   CKD (chronic kidney disease)    Diabetes mellitus without complication ([place])    type II   GERD (gastroesophageal reflux disease)    High cholesterol    Hypertension    Osteoarthritis    Skin cancer, basal cell     Past Surgical History:  Procedure Laterality Date   ABDOMINAL HYSTERECTOMY N/A    APPENDECTOMY N/A 1953   BACK SURGERY     CHOLECYSTECTOMY     COLONOSCOPY N/A 02/22/2008   TOTAL KNEE ARTHROPLASTY Right    UMBILICAL HERNIA REPAIR N/A 04/02/2015   Procedure: LAPAROSCOPIC UMBILICAL HERNIA REPAIR WITH VENTRALIGHT MESH;   Surgeon: Ralene Ok, MD;  Location: [facility];  Service: General;  Laterality: N/A;    Current Medications: Outpatient Medications Prior to Visit  Medication Sig Dispense Refill   aspirin EC 81 MG tablet Take 81 mg by mouth every other day.      Cholecalciferol (VITAMIN D3) 1.25 MG (50000 UT) TABS Take 1 tablet by mouth daily.     cyclobenzaprine (FLEXERIL) 10 MG tablet Take 5 mg by mouth 3 (three) times daily as needed for muscle spasms.      doxazosin (CARDURA) 4 MG tablet Take 4 mg by mouth daily.     fenofibrate 160 MG tablet Take 160 mg by mouth every evening.      gemfibrozil (LOPID) 600 MG tablet Take by mouth.     HYDROcodone-acetaminophen (NORCO/VICODIN) 5-325 MG tablet Take 1 tablet by mouth daily.     Iron-FA-B Cmp-C-Biot-Probiotic (FUSION PLUS) CAPS Take 1 capsule by mouth daily.     metoprolol succinate (TOPROL-XL) 100 MG 24 hr tablet Take 100 mg by mouth every evening. Take with or immediately following a meal.     Multiple Vitamins-Minerals (HAIR SKIN AND NAILS FORMULA) TABS 1 tablet     NIFEdipine (PROCARDIA XL) 30 MG 24 hr tablet Take 1 tablet (30 mg total) by mouth daily. 90 tablet 3  sulfamethoxazole-trimethoprim (BACTRIM DS) 800-160 MG tablet Take 1 tablet by mouth 2 (two) times daily.     torsemide (DEMADEX) 20 MG tablet Take 1 tablet (20 mg total) by mouth 2 (two) times daily. 180 tablet 3   No facility-administered medications prior to visit.     Allergies:   Other, Penicillins, Lovastatin, Lisinopril, Neomycin sulfate [neomycin], Niaspan [niacin], Rofecoxib, Valsartan, Vytorin [ezetimibe-simvastatin], and Zetia [ezetimibe]   Social History   Socioeconomic History   Marital status: Married    Spouse name: Not on file   Number of children: Not on file   Years of education: Not on file   Highest education level: Not on file  Occupational History   Not on file  Tobacco Use   Smoking status: Never   Smokeless tobacco: Never  Substance and Sexual Activity    Alcohol use: Yes    Comment: twice a year   Drug use: No   Sexual activity: Not on file  Other Topics Concern   Not on file  Social History Narrative   Not on file   Social Determinants of Health   Financial Resource Strain: Not on file  Food Insecurity: Not on file  Transportation Needs: Not on file  Physical Activity: Not on file  Stress: Not on file  Social Connections: Not on file     Family History:  The patient's family history includes Breast cancer in her sister; Heart attack in her father; Heart failure in her mother; Hypertension in her mother; Prostate cancer in her father.     ROS:   Please see the history of present illness.    ROS All other systems reviewed and are negative.   PHYSICAL EXAM:   VS:  BP (!) 156/62   Pulse 76   Ht 5\' 3"  (1.6 m)   Wt 89.8 kg   SpO2 98%   BMI 35.07 kg/m     Affect appropriate Obese female  HEENT: normal Neck supple with no adenopathy JVP normal no bruits no thyromegaly Lungs clear with no wheezing and good diaphragmatic motion Heart:  S1/S2 no murmur, no rub, gallop or click PMI normal Abdomen: benighn, BS positve, no tenderness, no AAA no bruit.  No HSM or HJR Distal pulses intact with no bruits Plus 2 bilateral edema Neuro non-focal Skin warm and dry No muscular weakness    Wt Readings from Last 3 Encounters:  03/11/21 89.8 kg  09/11/20 96.6 kg  07/10/20 97.8 kg      Studies/Labs Reviewed:   EKG:     06/22/19 SR rate 66 chronic biphasic inferior lateral T wave changes 03/11/2021 SR rate 82 Inferolateral T wave changes chronic   Recent Labs: 07/25/2020: BUN 52; Creatinine, Ser 2.31; Potassium 4.7; Sodium 142   Lipid Panel No results found for: CHOL, TRIG, HDL, CHOLHDL, VLDL, LDLCALC, LDLDIRECT  Additional studies/ records that were reviewed today include:  2D ECHO: 11/18/2016 LV EF: 60% -   65% Study Conclusions - Left ventricle: The cavity size was normal. There appeared to   asymmetric hypertrophy of  the LV apex. Systolic function was   normal. The estimated ejection fraction was in the range of 60%   to 65%. Wall motion was normal; there were no regional wall   motion abnormalities. Features are consistent with a pseudonormal   left ventricular filling pattern, with concomitant abnormal   relaxation and increased filling pressure (grade 2 diastolic   dysfunction). - Aortic valve: There was no stenosis. - Mitral valve: Mildly calcified  annulus. There was trivial   regurgitation. - Left atrium: The atrium was moderately dilated. - Right ventricle: The cavity size was normal. Systolic function   was normal. - Tricuspid valve: Peak RV-RA gradient (S): 45 mm Hg. - Pulmonary arteries: PA peak pressure: 48 mm Hg (S). - Inferior vena cava: The vessel was normal in size. The   respirophasic diameter changes were in the normal range (= 50%),   consistent with normal central venous pressure. Impressions: - Normal LV size with asymmetric hypertrophy of the LV apex   (spade-shaped ventricle). This is suggestive of apical   hypertrophic cardiomyopathy. EF 60-65%. Normal RV size and   systolic function. No significant valvular abnormalities. Mild   pulmonary hypertension.     ASSESSMENT & PLAN:   Acute on chronic diastolic CHF:  Not clear that her edema has anything to due with heart Needs Better BP control low sodium diet Torsemide started 07/10/20   Some improvement   Dyspnea:  Weight loss and control of BP most important  will update echo   Abnormal ECHO: ? Apical HOCM  cannot have MRI with gadolinium due to CRF Only Rx Needed is better BP control and monitoring of renal failure  HTN:  Unable to take hydralazine Unable to use ACE/ARB/Amlodipoine Procardia added 08/2020 F/u primary and renal   HLD: previously did not tolerate Lovastatin. On Co Q 10 and lipids have been moderately well controlled.  DMT2: HgA1c 6.1 most recently. Not on any medications for his  CKD: now followed by Dr.  Posey Pronto. [place] no RAS  Baseline Cr around 2.3 07/25/20    Aldactone d/c Anemia requiring iron injections    F/U primary / renal Echo for dyspnea and ? Diastolic dysfunction   F/U in  a year     Jenkins Rouge

## 2021-03-11 ENCOUNTER — Ambulatory Visit (INDEPENDENT_AMBULATORY_CARE_PROVIDER_SITE_OTHER): Payer: Medicare Other | Admitting: Cardiovascular Disease

## 2021-03-11 ENCOUNTER — Encounter: Payer: Self-pay | Admitting: Cardiovascular Disease

## 2021-03-11 ENCOUNTER — Other Ambulatory Visit: Payer: Self-pay

## 2021-03-11 VITALS — BP 156/62 | HR 76 | Ht 63.0 in | Wt 198.0 lb

## 2021-03-11 DIAGNOSIS — I150 Renovascular hypertension: Secondary | ICD-10-CM

## 2021-03-11 DIAGNOSIS — E782 Mixed hyperlipidemia: Secondary | ICD-10-CM

## 2021-03-11 DIAGNOSIS — I5043 Acute on chronic combined systolic (congestive) and diastolic (congestive) heart failure: Secondary | ICD-10-CM

## 2021-03-11 DIAGNOSIS — N184 Chronic kidney disease, stage 4 (severe): Secondary | ICD-10-CM | POA: Diagnosis not present

## 2021-03-11 NOTE — Patient Instructions (Signed)
Medication Instructions:  *If you need a refill on your cardiac medications before your next appointment, please call your pharmacy*  Lab Work: If you have labs (blood work) drawn today and your tests are completely normal, you will receive your results only by: MyChart Message (if you have MyChart) OR A paper copy in the mail If you have any lab test that is abnormal or we need to change your treatment, we will call you to review the results.  Follow-Up: At [facility], you and your health needs are our priority.  As part of our continuing mission to provide you with exceptional heart care, we have created designated Provider Care Teams.  These Care Teams include your primary Cardiologist (physician) and Advanced Practice Providers (APPs -  Physician Assistants and Nurse Practitioners) who all work together to provide you with the care you need, when you need it.  We recommend signing up for the patient portal called "MyChart".  Sign up information is provided on this After Visit Summary.  MyChart is used to connect with patients for Virtual Visits (Telemedicine).  Patients are able to view lab/test results, encounter notes, upcoming appointments, etc.  Non-urgent messages can be sent to your provider as well.   To learn more about what you can do with MyChart, go to [email or web address].    Your next appointment:   1 year(s)  The format for your next appointment:   In Person  Provider:   You may see Peter Nishan, MD or one of the following Advanced Practice Providers on your designated Care Team:   Laura Ingold, NP  

## 2021-03-16 DIAGNOSIS — Z85828 Personal history of other malignant neoplasm of skin: Secondary | ICD-10-CM | POA: Diagnosis not present

## 2021-03-16 DIAGNOSIS — C44311 Basal cell carcinoma of skin of nose: Secondary | ICD-10-CM | POA: Diagnosis not present

## 2021-03-20 ENCOUNTER — Ambulatory Visit (INDEPENDENT_AMBULATORY_CARE_PROVIDER_SITE_OTHER): Payer: Medicare Other | Admitting: Podiatry

## 2021-03-20 ENCOUNTER — Other Ambulatory Visit: Payer: Self-pay

## 2021-03-20 DIAGNOSIS — B351 Tinea unguium: Secondary | ICD-10-CM | POA: Diagnosis not present

## 2021-03-20 DIAGNOSIS — M79676 Pain in unspecified toe(s): Secondary | ICD-10-CM

## 2021-03-20 DIAGNOSIS — E1121 Type 2 diabetes mellitus with diabetic nephropathy: Secondary | ICD-10-CM | POA: Diagnosis not present

## 2021-03-24 ENCOUNTER — Encounter: Payer: Self-pay | Admitting: Podiatry

## 2021-03-24 NOTE — Progress Notes (Signed)
Subjective: Tabitha Black is a 87 y.o. female patient seen today for follow up of  painful thick toenails that are difficult to trim. Pain interferes with ambulation. Aggravating factors include wearing enclosed shoe gear. Pain is relieved with periodic professional debridement.  Patient states their blood glucose was 138 mg/dl today.  PCP is [name], MD. Last visit was: 11/26/2020.  She states her legs are more swollen today as she has been on her feet; right >left.   Allergies  Allergen Reactions   Other Swelling    Topical mycin, unsure of which one, caused opthalmic swelling   Penicillins Shortness Of Breath, Itching, Swelling and Rash   Lovastatin Other (See Comments)    Weakness and myalgias, maybe to statins?   Azithromycin    Clindamycin    Lisinopril Other (See Comments)    Hyperkalemia    Neomycin Sulfate [Neomycin] Other (See Comments)    Redness and burning in face   Niaspan [Niacin] Hives   Rofecoxib Other (See Comments)   Tetracycline    Valsartan Other (See Comments)    Hyperkalemia    Vytorin [Ezetimibe-Simvastatin] Other (See Comments)    myalgias   Zetia [Ezetimibe] Other (See Comments)    Leg pain    PCP is [name], MD .  Objective: Physical Exam  General: Patient is a pleasant 87 y.o. Caucasian female in NAD. AAO x 3.   Neurovascular Examination: Capillary refill time to digits immediate b/l. Palpable pedal pulses b/l LE. Pedal hair sparse b/l. Lower extremity skin temperature gradient within normal limits. +2 edema RLE; +1 edema LLE. No blistering, no erythema.   Protective sensation intact 5/5 intact bilaterally with 10g monofilament b/l. Vibratory sensation intact b/l.  Dermatological:  Skin warm and supple b/l lower extremities. No open wounds b/l lower extremities. No interdigital macerations b/l lower extremities. Toenails 1-5 b/l elongated, discolored, dystrophic, thickened, crumbly with subungual debris and tenderness to  dorsal palpation.  No hyperkeratosis left 3rd digit.  Musculoskeletal:  Normal muscle strength 5/5 to all lower extremity muscle groups bilaterally. No pain crepitus or joint limitation noted with ROM b/l lower extremities.  Gross deformities: Hammertoe(s) noted to the 2-5 bilaterally.  Assessment: 1. Pain due to onychomycosis of toenail   2. Diabetic nephropathy associated with type 2 diabetes mellitus ([place])     Plan: -Examined patient. -Corn distal tip left 3rd digit has resolved. -Continue diabetic foot care principles: inspect feet daily, monitor glucose as recommended by PCP and/or Endocrinologist, and follow prescribed diet per PCP, Endocrinologist and/or dietician. -Patient to continue soft, supportive shoe gear daily. -Toenails 1-5 b/l were debrided in length and girth with sterile nail nippers and dremel without iatrogenic bleeding.  -Patient to report any pedal injuries to medical professional immediately. -Patient/POA to call should there be question/concern in the interim.  No follow-ups on file.  Marzetta Board, DPM

## 2021-03-30 ENCOUNTER — Other Ambulatory Visit: Payer: Self-pay | Admitting: Family Medicine

## 2021-03-30 DIAGNOSIS — Z1231 Encounter for screening mammogram for malignant neoplasm of breast: Secondary | ICD-10-CM

## 2021-04-10 DIAGNOSIS — M25562 Pain in left knee: Secondary | ICD-10-CM | POA: Diagnosis not present

## 2021-05-01 ENCOUNTER — Other Ambulatory Visit: Payer: Self-pay

## 2021-05-01 ENCOUNTER — Ambulatory Visit
Admission: RE | Admit: 2021-05-01 | Discharge: 2021-05-01 | Disposition: A | Discharge status: Home / Self Care | Payer: Medicare Other | Source: Ambulatory Visit | Attending: Family Medicine | Admitting: Family Medicine

## 2021-05-01 DIAGNOSIS — Z1231 Encounter for screening mammogram for malignant neoplasm of breast: Secondary | ICD-10-CM

## 2021-05-25 DIAGNOSIS — N184 Chronic kidney disease, stage 4 (severe): Secondary | ICD-10-CM | POA: Diagnosis not present

## 2021-05-25 DIAGNOSIS — D692 Other nonthrombocytopenic purpura: Secondary | ICD-10-CM | POA: Diagnosis not present

## 2021-05-25 DIAGNOSIS — M1990 Unspecified osteoarthritis, unspecified site: Secondary | ICD-10-CM | POA: Diagnosis not present

## 2021-05-25 DIAGNOSIS — T466X5A Adverse effect of antihyperlipidemic and antiarteriosclerotic drugs, initial encounter: Secondary | ICD-10-CM | POA: Diagnosis not present

## 2021-05-25 DIAGNOSIS — D649 Anemia, unspecified: Secondary | ICD-10-CM | POA: Diagnosis not present

## 2021-05-25 DIAGNOSIS — I5032 Chronic diastolic (congestive) heart failure: Secondary | ICD-10-CM | POA: Diagnosis not present

## 2021-05-25 DIAGNOSIS — R609 Edema, unspecified: Secondary | ICD-10-CM | POA: Diagnosis not present

## 2021-05-25 DIAGNOSIS — E1122 Type 2 diabetes mellitus with diabetic chronic kidney disease: Secondary | ICD-10-CM | POA: Diagnosis not present

## 2021-05-25 DIAGNOSIS — E782 Mixed hyperlipidemia: Secondary | ICD-10-CM | POA: Diagnosis not present

## 2021-05-25 DIAGNOSIS — R54 Age-related physical debility: Secondary | ICD-10-CM | POA: Diagnosis not present

## 2021-05-25 DIAGNOSIS — I130 Hypertensive heart and chronic kidney disease with heart failure and stage 1 through stage 4 chronic kidney disease, or unspecified chronic kidney disease: Secondary | ICD-10-CM | POA: Diagnosis not present

## 2021-06-27 DIAGNOSIS — Z20822 Contact with and (suspected) exposure to covid-19: Secondary | ICD-10-CM | POA: Diagnosis not present

## 2021-06-29 ENCOUNTER — Ambulatory Visit (INDEPENDENT_AMBULATORY_CARE_PROVIDER_SITE_OTHER): Payer: Medicare Other | Admitting: Podiatry

## 2021-06-29 ENCOUNTER — Encounter: Payer: Self-pay | Admitting: Podiatry

## 2021-06-29 ENCOUNTER — Other Ambulatory Visit: Payer: Self-pay

## 2021-06-29 DIAGNOSIS — M79676 Pain in unspecified toe(s): Secondary | ICD-10-CM | POA: Diagnosis not present

## 2021-06-29 DIAGNOSIS — M2041 Other hammer toe(s) (acquired), right foot: Secondary | ICD-10-CM | POA: Diagnosis not present

## 2021-06-29 DIAGNOSIS — M2142 Flat foot [pes planus] (acquired), left foot: Secondary | ICD-10-CM

## 2021-06-29 DIAGNOSIS — M2141 Flat foot [pes planus] (acquired), right foot: Secondary | ICD-10-CM | POA: Diagnosis not present

## 2021-06-29 DIAGNOSIS — E1121 Type 2 diabetes mellitus with diabetic nephropathy: Secondary | ICD-10-CM

## 2021-06-29 DIAGNOSIS — E119 Type 2 diabetes mellitus without complications: Secondary | ICD-10-CM | POA: Diagnosis not present

## 2021-06-29 DIAGNOSIS — B351 Tinea unguium: Secondary | ICD-10-CM

## 2021-06-29 NOTE — Progress Notes (Signed)
ANNUAL DIABETIC FOOT EXAM  Subjective: Tabitha Black presents today for annual diabetic foot examination and painful elongated mycotic toenails 1-5 bilaterally which are tender when wearing enclosed shoe gear. Pain is relieved with periodic professional debridement..  Patient denies any numbness, tingling, burning, or pins/needle sensation in feet.  Patient's blood sugar was 127 mg/dl today.   [name], MD is patient's PCP. Last visit was 11/26/2020.  Past Medical History:  Diagnosis Date   CKD (chronic kidney disease)    Diabetes mellitus without complication ([facility])    type II   GERD (gastroesophageal reflux disease)    High cholesterol    Hypertension    Osteoarthritis    Skin cancer, basal cell    Patient Active Problem List   Diagnosis Date Noted   Pain in joint of left knee 01/06/2021   Adverse effect of antihyperlipidemic and antiarteriosclerotic drugs, initial encounter 08/22/2020   Age-related physical debility 08/22/2020   Chronic congestive heart failure ([place]) 08/22/2020   Chronic kidney disease, stage 4 (severe) ([place]) 08/22/2020   Diabetic renal disease ([place]) 08/22/2020   Gastroesophageal reflux disease 08/22/2020   History of malignant neoplasm of skin 08/22/2020   Hypertensive heart and chronic kidney disease with heart failure and stage 1 through stage 4 chronic kidney disease, or unspecified chronic kidney disease ([place]) 08/22/2020   Iron deficiency anemia 08/22/2020   Mixed hyperlipidemia 08/22/2020   Morbid obesity ([place]) 08/22/2020   Osteoarthritis 08/22/2020   Personal history of colonic polyps 08/22/2020   Skin ulcer ([place]) 08/22/2020   Cervical spondylosis with radiculopathy 07/26/2017   Small bowel obstruction ([place]) 04/01/2015   Past Surgical History:  Procedure Laterality Date   ABDOMINAL HYSTERECTOMY N/A    APPENDECTOMY N/A 1953   BACK SURGERY     CHOLECYSTECTOMY     COLONOSCOPY N/A 02/22/2008   TOTAL KNEE ARTHROPLASTY Right    UMBILICAL  HERNIA REPAIR N/A 04/02/2015   Procedure: LAPAROSCOPIC UMBILICAL HERNIA REPAIR WITH VENTRALIGHT MESH;  Surgeon: Ralene Ok, MD;  Location: [facility];  Service: General;  Laterality: N/A;   Current Outpatient Medications on File Prior to Visit  Medication Sig Dispense Refill   aspirin EC 81 MG tablet Take 81 mg by mouth every other day.      Cholecalciferol (VITAMIN D3) 1.25 MG (50000 UT) TABS Take 1 tablet by mouth daily.     cyclobenzaprine (FLEXERIL) 10 MG tablet Take 5 mg by mouth 3 (three) times daily as needed for muscle spasms.      doxazosin (CARDURA) 4 MG tablet Take 4 mg by mouth daily.     doxycycline (VIBRAMYCIN) 100 MG capsule Take 100 mg by mouth 2 (two) times daily.     fenofibrate 160 MG tablet Take 160 mg by mouth every evening.      gemfibrozil (LOPID) 600 MG tablet Take by mouth.     HYDROcodone-acetaminophen (NORCO/VICODIN) 5-325 MG tablet Take 1 tablet by mouth daily.     Iron-FA-B Cmp-C-Biot-Probiotic (FUSION PLUS) CAPS Take 1 capsule by mouth daily.     metoprolol succinate (TOPROL-XL) 100 MG 24 hr tablet Take 100 mg by mouth every evening. Take with or immediately following a meal.     Multiple Vitamins-Minerals (HAIR SKIN AND NAILS FORMULA) TABS 1 tablet     NIFEdipine (PROCARDIA XL) 30 MG 24 hr tablet Take 1 tablet (30 mg total) by mouth daily. 90 tablet 3   sulfamethoxazole-trimethoprim (BACTRIM DS) 800-160 MG tablet Take 1 tablet by mouth 2 (two) times daily.  torsemide (DEMADEX) 20 MG tablet Take 1 tablet (20 mg total) by mouth 2 (two) times daily. 180 tablet 3   No current facility-administered medications on file prior to visit.    Allergies  Allergen Reactions   Other Swelling    Topical mycin, unsure of which one, caused opthalmic swelling   Penicillins Shortness Of Breath, Itching, Swelling and Rash   Lovastatin Other (See Comments)    Weakness and myalgias, maybe to statins?   Azithromycin    Clindamycin    Lisinopril Other (See Comments)     Hyperkalemia    Neomycin Sulfate [Neomycin] Other (See Comments)    Redness and burning in face   Niaspan [Niacin] Hives   Rofecoxib Other (See Comments)   Tetracycline    Valsartan Other (See Comments)    Hyperkalemia    Vytorin [Ezetimibe-Simvastatin] Other (See Comments)    myalgias   Zetia [Ezetimibe] Other (See Comments)    Leg pain   Social History   Occupational History   Not on file  Tobacco Use   Smoking status: Never   Smokeless tobacco: Never  Substance and Sexual Activity   Alcohol use: Yes    Comment: twice a year   Drug use: No   Sexual activity: Not on file   Family History  Problem Relation Age of Onset   Heart failure Mother    Hypertension Mother    Heart attack Father    Prostate cancer Father    Breast cancer Sister    Immunization History  Administered Date(s) Administered   Influenza, High Dose Seasonal PF 04/28/2018, 04/13/2019     Review of Systems: Negative except as noted in the HPI.   Objective: There were no vitals filed for this visit.  Tabitha Black is a pleasant 87 y.o. female in NAD. AAO X 3.  Vascular Examination: Capillary refill time to digits immediate b/l. Palpable DP pulse(s) b/l LE. Palpable PT pulse(s) b/l LE. Pedal hair absent. No pain with calf compression b/l. +2 pitting edema BLE. Evidence of chronic venous insufficiency b/l lower extremities.  Dermatological Examination: No open wounds b/l LE. No interdigital macerations noted b/l LE. Toenails 1-5 b/l elongated, discolored, dystrophic, thickened, crumbly with subungual debris and tenderness to dorsal palpation. No hyperkeratotic nor porokeratotic lesions present on today's visit.  Musculoskeletal Examination: Muscle strength 5/5 to all lower extremity muscle groups bilaterally. Hammertoe deformity noted 2-5 b/l. Pes planus deformity noted bilateral LE.  Footwear Assessment: Does the patient wear appropriate shoes? Yes. Does the patient need inserts/orthotics?  Yes.  Neurological Examination: Protective sensation intact 5/5 intact bilaterally with 10g monofilament b/l. Vibratory sensation intact b/l.  Assessment: 1. Pain due to onychomycosis of toenail   2. Diabetic nephropathy associated with type 2 diabetes mellitus ([place])   3. Pes planus of both feet   4. Acquired hammertoes of both feet   5. Encounter for diabetic foot exam ([place])     ADA Risk Categorization: Low Risk :  Patient has all of the following: Intact protective sensation No prior foot ulcer  No severe deformity Pedal pulses present  Plan: -Examined patient. -Continue foot and shoe inspections daily. Monitor blood glucose per PCP/Endocrinologist's recommendations. -Mycotic toenails 1-5 bilaterally were debrided in length and girth with sterile nail nippers and dremel without incident. -Patient/POA to call should there be question/concern in the interim.  Return in about 3 months (around 09/27/2021).  Marzetta Board, DPM

## 2021-07-03 ENCOUNTER — Other Ambulatory Visit: Payer: Self-pay | Admitting: Cardiovascular Disease

## 2021-07-08 ENCOUNTER — Other Ambulatory Visit: Payer: Self-pay | Admitting: Cardiovascular Disease

## 2021-07-19 DIAGNOSIS — Z20822 Contact with and (suspected) exposure to covid-19: Secondary | ICD-10-CM | POA: Diagnosis not present

## 2021-07-21 DIAGNOSIS — N184 Chronic kidney disease, stage 4 (severe): Secondary | ICD-10-CM | POA: Diagnosis not present

## 2021-08-12 DIAGNOSIS — I129 Hypertensive chronic kidney disease with stage 1 through stage 4 chronic kidney disease, or unspecified chronic kidney disease: Secondary | ICD-10-CM | POA: Diagnosis not present

## 2021-08-12 DIAGNOSIS — E1122 Type 2 diabetes mellitus with diabetic chronic kidney disease: Secondary | ICD-10-CM | POA: Diagnosis not present

## 2021-08-12 DIAGNOSIS — D631 Anemia in chronic kidney disease: Secondary | ICD-10-CM | POA: Diagnosis not present

## 2021-08-12 DIAGNOSIS — N2581 Secondary hyperparathyroidism of renal origin: Secondary | ICD-10-CM | POA: Diagnosis not present

## 2021-08-12 DIAGNOSIS — N184 Chronic kidney disease, stage 4 (severe): Secondary | ICD-10-CM | POA: Diagnosis not present

## 2021-08-19 DIAGNOSIS — Z20822 Contact with and (suspected) exposure to covid-19: Secondary | ICD-10-CM | POA: Diagnosis not present

## 2021-09-02 DIAGNOSIS — E119 Type 2 diabetes mellitus without complications: Secondary | ICD-10-CM | POA: Diagnosis not present

## 2021-09-02 DIAGNOSIS — H2513 Age-related nuclear cataract, bilateral: Secondary | ICD-10-CM | POA: Diagnosis not present

## 2021-09-02 DIAGNOSIS — H3500 Unspecified background retinopathy: Secondary | ICD-10-CM | POA: Diagnosis not present

## 2021-09-02 DIAGNOSIS — H52203 Unspecified astigmatism, bilateral: Secondary | ICD-10-CM | POA: Diagnosis not present

## 2021-10-05 ENCOUNTER — Other Ambulatory Visit: Payer: Self-pay

## 2021-10-05 ENCOUNTER — Ambulatory Visit (INDEPENDENT_AMBULATORY_CARE_PROVIDER_SITE_OTHER): Payer: Medicare Other | Admitting: Podiatry

## 2021-10-05 DIAGNOSIS — B351 Tinea unguium: Secondary | ICD-10-CM

## 2021-10-05 DIAGNOSIS — E1121 Type 2 diabetes mellitus with diabetic nephropathy: Secondary | ICD-10-CM

## 2021-10-05 DIAGNOSIS — M2142 Flat foot [pes planus] (acquired), left foot: Secondary | ICD-10-CM

## 2021-10-05 DIAGNOSIS — M79676 Pain in unspecified toe(s): Secondary | ICD-10-CM | POA: Diagnosis not present

## 2021-10-05 DIAGNOSIS — M2141 Flat foot [pes planus] (acquired), right foot: Secondary | ICD-10-CM | POA: Diagnosis not present

## 2021-10-05 NOTE — Progress Notes (Signed)
This patient returns to my office for at risk foot care.  This patient requires this care by a professional since this patient will be at risk due to having CKD.  This patient is unable to cut nails herself since the patient cannot reach her nails.These nails are painful walking and wearing shoes.  This patient presents for at risk foot care today. ? ?General Appearance  Alert, conversant and in no acute stress. ? ?Vascular  Dorsalis pedis and posterior tibial  pulses are not  palpable due to swelling  bilaterally.  Capillary return is within normal limits  bilaterally. Temperature is within normal limits  bilaterally. ? ?Neurologic  Senn-Weinstein monofilament wire test within normal limits  bilaterally. Muscle power within normal limits bilaterally. ? ?Nails Thick disfigured discolored nails with subungual debris  from hallux to fifth toes bilaterally. No evidence of bacterial infection or drainage bilaterally. ? ?Orthopedic  No limitations of motion  feet .  No crepitus or effusions noted.  No bony pathology or digital deformities noted. ? ?Skin  normotropic skin with no porokeratosis noted bilaterally.  No signs of infections or ulcers noted.    ? ?Onychomycosis  Pain in right toes  Pain in left toes ? ?Consent was obtained for treatment procedures.   Mechanical debridement of nails 1-5  bilaterally performed with a nail nipper.  Filed with dremel without incident. Padding dispensed third toe right foot. ? ? ?Return office visit  3 months                    Told patient to return for periodic foot care and evaluation due to potential at risk complications. ? ? ?Gardiner Barefoot DPM   ?

## 2021-10-15 DIAGNOSIS — I5032 Chronic diastolic (congestive) heart failure: Secondary | ICD-10-CM | POA: Diagnosis not present

## 2021-10-15 DIAGNOSIS — I130 Hypertensive heart and chronic kidney disease with heart failure and stage 1 through stage 4 chronic kidney disease, or unspecified chronic kidney disease: Secondary | ICD-10-CM | POA: Diagnosis not present

## 2021-10-15 DIAGNOSIS — E1122 Type 2 diabetes mellitus with diabetic chronic kidney disease: Secondary | ICD-10-CM | POA: Diagnosis not present

## 2021-10-15 DIAGNOSIS — N184 Chronic kidney disease, stage 4 (severe): Secondary | ICD-10-CM | POA: Diagnosis not present

## 2021-10-15 DIAGNOSIS — R519 Headache, unspecified: Secondary | ICD-10-CM | POA: Diagnosis not present

## 2021-10-15 DIAGNOSIS — U071 COVID-19: Secondary | ICD-10-CM | POA: Diagnosis not present

## 2021-11-17 DIAGNOSIS — N184 Chronic kidney disease, stage 4 (severe): Secondary | ICD-10-CM | POA: Diagnosis not present

## 2021-11-23 DIAGNOSIS — N184 Chronic kidney disease, stage 4 (severe): Secondary | ICD-10-CM | POA: Diagnosis not present

## 2021-11-23 DIAGNOSIS — D631 Anemia in chronic kidney disease: Secondary | ICD-10-CM | POA: Diagnosis not present

## 2021-11-23 DIAGNOSIS — E1122 Type 2 diabetes mellitus with diabetic chronic kidney disease: Secondary | ICD-10-CM | POA: Diagnosis not present

## 2021-11-23 DIAGNOSIS — N2581 Secondary hyperparathyroidism of renal origin: Secondary | ICD-10-CM | POA: Diagnosis not present

## 2021-11-23 DIAGNOSIS — I129 Hypertensive chronic kidney disease with stage 1 through stage 4 chronic kidney disease, or unspecified chronic kidney disease: Secondary | ICD-10-CM | POA: Diagnosis not present

## 2021-11-27 DIAGNOSIS — S81811A Laceration without foreign body, right lower leg, initial encounter: Secondary | ICD-10-CM | POA: Diagnosis not present

## 2021-11-27 DIAGNOSIS — D649 Anemia, unspecified: Secondary | ICD-10-CM | POA: Diagnosis not present

## 2021-11-27 DIAGNOSIS — T466X5A Adverse effect of antihyperlipidemic and antiarteriosclerotic drugs, initial encounter: Secondary | ICD-10-CM | POA: Diagnosis not present

## 2021-11-27 DIAGNOSIS — N184 Chronic kidney disease, stage 4 (severe): Secondary | ICD-10-CM | POA: Diagnosis not present

## 2021-11-27 DIAGNOSIS — M1990 Unspecified osteoarthritis, unspecified site: Secondary | ICD-10-CM | POA: Diagnosis not present

## 2021-11-27 DIAGNOSIS — I5032 Chronic diastolic (congestive) heart failure: Secondary | ICD-10-CM | POA: Diagnosis not present

## 2021-11-27 DIAGNOSIS — I130 Hypertensive heart and chronic kidney disease with heart failure and stage 1 through stage 4 chronic kidney disease, or unspecified chronic kidney disease: Secondary | ICD-10-CM | POA: Diagnosis not present

## 2021-11-27 DIAGNOSIS — E1122 Type 2 diabetes mellitus with diabetic chronic kidney disease: Secondary | ICD-10-CM | POA: Diagnosis not present

## 2021-11-27 DIAGNOSIS — E782 Mixed hyperlipidemia: Secondary | ICD-10-CM | POA: Diagnosis not present

## 2021-11-27 DIAGNOSIS — Z0000 Encounter for general adult medical examination without abnormal findings: Secondary | ICD-10-CM | POA: Diagnosis not present

## 2021-11-27 DIAGNOSIS — R609 Edema, unspecified: Secondary | ICD-10-CM | POA: Diagnosis not present

## 2021-11-30 DIAGNOSIS — N184 Chronic kidney disease, stage 4 (severe): Secondary | ICD-10-CM | POA: Diagnosis not present

## 2021-11-30 DIAGNOSIS — S81811D Laceration without foreign body, right lower leg, subsequent encounter: Secondary | ICD-10-CM | POA: Diagnosis not present

## 2021-11-30 DIAGNOSIS — Z4800 Encounter for change or removal of nonsurgical wound dressing: Secondary | ICD-10-CM | POA: Diagnosis not present

## 2021-11-30 DIAGNOSIS — D692 Other nonthrombocytopenic purpura: Secondary | ICD-10-CM | POA: Diagnosis not present

## 2021-11-30 DIAGNOSIS — Z9181 History of falling: Secondary | ICD-10-CM | POA: Diagnosis not present

## 2021-11-30 DIAGNOSIS — I5032 Chronic diastolic (congestive) heart failure: Secondary | ICD-10-CM | POA: Diagnosis not present

## 2021-11-30 DIAGNOSIS — D631 Anemia in chronic kidney disease: Secondary | ICD-10-CM | POA: Diagnosis not present

## 2021-11-30 DIAGNOSIS — W2209XD Striking against other stationary object, subsequent encounter: Secondary | ICD-10-CM | POA: Diagnosis not present

## 2021-11-30 DIAGNOSIS — Z6831 Body mass index (BMI) 31.0-31.9, adult: Secondary | ICD-10-CM | POA: Diagnosis not present

## 2021-11-30 DIAGNOSIS — E1122 Type 2 diabetes mellitus with diabetic chronic kidney disease: Secondary | ICD-10-CM | POA: Diagnosis not present

## 2021-11-30 DIAGNOSIS — M1990 Unspecified osteoarthritis, unspecified site: Secondary | ICD-10-CM | POA: Diagnosis not present

## 2021-11-30 DIAGNOSIS — K219 Gastro-esophageal reflux disease without esophagitis: Secondary | ICD-10-CM | POA: Diagnosis not present

## 2021-11-30 DIAGNOSIS — Z79891 Long term (current) use of opiate analgesic: Secondary | ICD-10-CM | POA: Diagnosis not present

## 2021-11-30 DIAGNOSIS — I872 Venous insufficiency (chronic) (peripheral): Secondary | ICD-10-CM | POA: Diagnosis not present

## 2021-11-30 DIAGNOSIS — I130 Hypertensive heart and chronic kidney disease with heart failure and stage 1 through stage 4 chronic kidney disease, or unspecified chronic kidney disease: Secondary | ICD-10-CM | POA: Diagnosis not present

## 2021-11-30 DIAGNOSIS — E782 Mixed hyperlipidemia: Secondary | ICD-10-CM | POA: Diagnosis not present

## 2021-11-30 DIAGNOSIS — R238 Other skin changes: Secondary | ICD-10-CM | POA: Diagnosis not present

## 2021-12-03 DIAGNOSIS — I5032 Chronic diastolic (congestive) heart failure: Secondary | ICD-10-CM | POA: Diagnosis not present

## 2021-12-03 DIAGNOSIS — W2209XD Striking against other stationary object, subsequent encounter: Secondary | ICD-10-CM | POA: Diagnosis not present

## 2021-12-03 DIAGNOSIS — R238 Other skin changes: Secondary | ICD-10-CM | POA: Diagnosis not present

## 2021-12-03 DIAGNOSIS — S81811D Laceration without foreign body, right lower leg, subsequent encounter: Secondary | ICD-10-CM | POA: Diagnosis not present

## 2021-12-03 DIAGNOSIS — I130 Hypertensive heart and chronic kidney disease with heart failure and stage 1 through stage 4 chronic kidney disease, or unspecified chronic kidney disease: Secondary | ICD-10-CM | POA: Diagnosis not present

## 2021-12-03 DIAGNOSIS — E1122 Type 2 diabetes mellitus with diabetic chronic kidney disease: Secondary | ICD-10-CM | POA: Diagnosis not present

## 2021-12-07 DIAGNOSIS — S81811D Laceration without foreign body, right lower leg, subsequent encounter: Secondary | ICD-10-CM | POA: Diagnosis not present

## 2021-12-07 DIAGNOSIS — I5032 Chronic diastolic (congestive) heart failure: Secondary | ICD-10-CM | POA: Diagnosis not present

## 2021-12-07 DIAGNOSIS — I130 Hypertensive heart and chronic kidney disease with heart failure and stage 1 through stage 4 chronic kidney disease, or unspecified chronic kidney disease: Secondary | ICD-10-CM | POA: Diagnosis not present

## 2021-12-07 DIAGNOSIS — E1122 Type 2 diabetes mellitus with diabetic chronic kidney disease: Secondary | ICD-10-CM | POA: Diagnosis not present

## 2021-12-07 DIAGNOSIS — W2209XD Striking against other stationary object, subsequent encounter: Secondary | ICD-10-CM | POA: Diagnosis not present

## 2021-12-07 DIAGNOSIS — R238 Other skin changes: Secondary | ICD-10-CM | POA: Diagnosis not present

## 2021-12-10 DIAGNOSIS — S81811D Laceration without foreign body, right lower leg, subsequent encounter: Secondary | ICD-10-CM | POA: Diagnosis not present

## 2021-12-10 DIAGNOSIS — R238 Other skin changes: Secondary | ICD-10-CM | POA: Diagnosis not present

## 2021-12-10 DIAGNOSIS — I130 Hypertensive heart and chronic kidney disease with heart failure and stage 1 through stage 4 chronic kidney disease, or unspecified chronic kidney disease: Secondary | ICD-10-CM | POA: Diagnosis not present

## 2021-12-10 DIAGNOSIS — W2209XD Striking against other stationary object, subsequent encounter: Secondary | ICD-10-CM | POA: Diagnosis not present

## 2021-12-10 DIAGNOSIS — I5032 Chronic diastolic (congestive) heart failure: Secondary | ICD-10-CM | POA: Diagnosis not present

## 2021-12-10 DIAGNOSIS — E1122 Type 2 diabetes mellitus with diabetic chronic kidney disease: Secondary | ICD-10-CM | POA: Diagnosis not present

## 2021-12-14 DIAGNOSIS — I130 Hypertensive heart and chronic kidney disease with heart failure and stage 1 through stage 4 chronic kidney disease, or unspecified chronic kidney disease: Secondary | ICD-10-CM | POA: Diagnosis not present

## 2021-12-14 DIAGNOSIS — S81811D Laceration without foreign body, right lower leg, subsequent encounter: Secondary | ICD-10-CM | POA: Diagnosis not present

## 2021-12-14 DIAGNOSIS — W2209XD Striking against other stationary object, subsequent encounter: Secondary | ICD-10-CM | POA: Diagnosis not present

## 2021-12-14 DIAGNOSIS — E1122 Type 2 diabetes mellitus with diabetic chronic kidney disease: Secondary | ICD-10-CM | POA: Diagnosis not present

## 2021-12-14 DIAGNOSIS — R238 Other skin changes: Secondary | ICD-10-CM | POA: Diagnosis not present

## 2021-12-14 DIAGNOSIS — I5032 Chronic diastolic (congestive) heart failure: Secondary | ICD-10-CM | POA: Diagnosis not present

## 2021-12-18 ENCOUNTER — Other Ambulatory Visit: Payer: Self-pay | Admitting: *Deleted

## 2021-12-18 DIAGNOSIS — I130 Hypertensive heart and chronic kidney disease with heart failure and stage 1 through stage 4 chronic kidney disease, or unspecified chronic kidney disease: Secondary | ICD-10-CM | POA: Diagnosis not present

## 2021-12-18 DIAGNOSIS — R238 Other skin changes: Secondary | ICD-10-CM | POA: Diagnosis not present

## 2021-12-18 DIAGNOSIS — W2209XD Striking against other stationary object, subsequent encounter: Secondary | ICD-10-CM | POA: Diagnosis not present

## 2021-12-18 DIAGNOSIS — I5032 Chronic diastolic (congestive) heart failure: Secondary | ICD-10-CM | POA: Diagnosis not present

## 2021-12-18 DIAGNOSIS — E1122 Type 2 diabetes mellitus with diabetic chronic kidney disease: Secondary | ICD-10-CM | POA: Diagnosis not present

## 2021-12-18 DIAGNOSIS — S81811D Laceration without foreign body, right lower leg, subsequent encounter: Secondary | ICD-10-CM | POA: Diagnosis not present

## 2021-12-18 NOTE — Patient Outreach (Signed)
[place] [facility]) Care Management  12/18/2021  CHIVON LEPAGE Aug 02, 1932 [id number]   Referral received for member for chronic care management services.  Noted that member's PCP has embedded care management, referral placed.  Will close case to [facility].  Valente David, RN, MSN, Heil Manager [phone]

## 2021-12-22 DIAGNOSIS — W2209XD Striking against other stationary object, subsequent encounter: Secondary | ICD-10-CM | POA: Diagnosis not present

## 2021-12-22 DIAGNOSIS — R238 Other skin changes: Secondary | ICD-10-CM | POA: Diagnosis not present

## 2021-12-22 DIAGNOSIS — E1122 Type 2 diabetes mellitus with diabetic chronic kidney disease: Secondary | ICD-10-CM | POA: Diagnosis not present

## 2021-12-22 DIAGNOSIS — S81811D Laceration without foreign body, right lower leg, subsequent encounter: Secondary | ICD-10-CM | POA: Diagnosis not present

## 2021-12-22 DIAGNOSIS — I5032 Chronic diastolic (congestive) heart failure: Secondary | ICD-10-CM | POA: Diagnosis not present

## 2021-12-22 DIAGNOSIS — I130 Hypertensive heart and chronic kidney disease with heart failure and stage 1 through stage 4 chronic kidney disease, or unspecified chronic kidney disease: Secondary | ICD-10-CM | POA: Diagnosis not present

## 2021-12-25 DIAGNOSIS — I5032 Chronic diastolic (congestive) heart failure: Secondary | ICD-10-CM | POA: Diagnosis not present

## 2021-12-25 DIAGNOSIS — E1122 Type 2 diabetes mellitus with diabetic chronic kidney disease: Secondary | ICD-10-CM | POA: Diagnosis not present

## 2021-12-25 DIAGNOSIS — R238 Other skin changes: Secondary | ICD-10-CM | POA: Diagnosis not present

## 2021-12-25 DIAGNOSIS — W2209XD Striking against other stationary object, subsequent encounter: Secondary | ICD-10-CM | POA: Diagnosis not present

## 2021-12-25 DIAGNOSIS — S81811D Laceration without foreign body, right lower leg, subsequent encounter: Secondary | ICD-10-CM | POA: Diagnosis not present

## 2021-12-25 DIAGNOSIS — I130 Hypertensive heart and chronic kidney disease with heart failure and stage 1 through stage 4 chronic kidney disease, or unspecified chronic kidney disease: Secondary | ICD-10-CM | POA: Diagnosis not present

## 2021-12-29 DIAGNOSIS — E1122 Type 2 diabetes mellitus with diabetic chronic kidney disease: Secondary | ICD-10-CM | POA: Diagnosis not present

## 2021-12-29 DIAGNOSIS — I130 Hypertensive heart and chronic kidney disease with heart failure and stage 1 through stage 4 chronic kidney disease, or unspecified chronic kidney disease: Secondary | ICD-10-CM | POA: Diagnosis not present

## 2021-12-29 DIAGNOSIS — I5032 Chronic diastolic (congestive) heart failure: Secondary | ICD-10-CM | POA: Diagnosis not present

## 2021-12-29 DIAGNOSIS — R238 Other skin changes: Secondary | ICD-10-CM | POA: Diagnosis not present

## 2021-12-29 DIAGNOSIS — S81811D Laceration without foreign body, right lower leg, subsequent encounter: Secondary | ICD-10-CM | POA: Diagnosis not present

## 2021-12-29 DIAGNOSIS — W2209XD Striking against other stationary object, subsequent encounter: Secondary | ICD-10-CM | POA: Diagnosis not present

## 2021-12-30 DIAGNOSIS — M1990 Unspecified osteoarthritis, unspecified site: Secondary | ICD-10-CM | POA: Diagnosis not present

## 2021-12-30 DIAGNOSIS — Z4800 Encounter for change or removal of nonsurgical wound dressing: Secondary | ICD-10-CM | POA: Diagnosis not present

## 2021-12-30 DIAGNOSIS — K219 Gastro-esophageal reflux disease without esophagitis: Secondary | ICD-10-CM | POA: Diagnosis not present

## 2021-12-30 DIAGNOSIS — I872 Venous insufficiency (chronic) (peripheral): Secondary | ICD-10-CM | POA: Diagnosis not present

## 2021-12-30 DIAGNOSIS — W2209XD Striking against other stationary object, subsequent encounter: Secondary | ICD-10-CM | POA: Diagnosis not present

## 2021-12-30 DIAGNOSIS — D631 Anemia in chronic kidney disease: Secondary | ICD-10-CM | POA: Diagnosis not present

## 2021-12-30 DIAGNOSIS — Z79891 Long term (current) use of opiate analgesic: Secondary | ICD-10-CM | POA: Diagnosis not present

## 2021-12-30 DIAGNOSIS — I5032 Chronic diastolic (congestive) heart failure: Secondary | ICD-10-CM | POA: Diagnosis not present

## 2021-12-30 DIAGNOSIS — D692 Other nonthrombocytopenic purpura: Secondary | ICD-10-CM | POA: Diagnosis not present

## 2021-12-30 DIAGNOSIS — I130 Hypertensive heart and chronic kidney disease with heart failure and stage 1 through stage 4 chronic kidney disease, or unspecified chronic kidney disease: Secondary | ICD-10-CM | POA: Diagnosis not present

## 2021-12-30 DIAGNOSIS — Z9181 History of falling: Secondary | ICD-10-CM | POA: Diagnosis not present

## 2021-12-30 DIAGNOSIS — E1122 Type 2 diabetes mellitus with diabetic chronic kidney disease: Secondary | ICD-10-CM | POA: Diagnosis not present

## 2021-12-30 DIAGNOSIS — E782 Mixed hyperlipidemia: Secondary | ICD-10-CM | POA: Diagnosis not present

## 2021-12-30 DIAGNOSIS — R238 Other skin changes: Secondary | ICD-10-CM | POA: Diagnosis not present

## 2021-12-30 DIAGNOSIS — N184 Chronic kidney disease, stage 4 (severe): Secondary | ICD-10-CM | POA: Diagnosis not present

## 2021-12-30 DIAGNOSIS — Z6831 Body mass index (BMI) 31.0-31.9, adult: Secondary | ICD-10-CM | POA: Diagnosis not present

## 2021-12-30 DIAGNOSIS — S81811D Laceration without foreign body, right lower leg, subsequent encounter: Secondary | ICD-10-CM | POA: Diagnosis not present

## 2021-12-31 DIAGNOSIS — I5032 Chronic diastolic (congestive) heart failure: Secondary | ICD-10-CM | POA: Diagnosis not present

## 2021-12-31 DIAGNOSIS — E1122 Type 2 diabetes mellitus with diabetic chronic kidney disease: Secondary | ICD-10-CM | POA: Diagnosis not present

## 2021-12-31 DIAGNOSIS — R238 Other skin changes: Secondary | ICD-10-CM | POA: Diagnosis not present

## 2021-12-31 DIAGNOSIS — I130 Hypertensive heart and chronic kidney disease with heart failure and stage 1 through stage 4 chronic kidney disease, or unspecified chronic kidney disease: Secondary | ICD-10-CM | POA: Diagnosis not present

## 2021-12-31 DIAGNOSIS — S81811D Laceration without foreign body, right lower leg, subsequent encounter: Secondary | ICD-10-CM | POA: Diagnosis not present

## 2021-12-31 DIAGNOSIS — W2209XD Striking against other stationary object, subsequent encounter: Secondary | ICD-10-CM | POA: Diagnosis not present

## 2022-01-05 DIAGNOSIS — I130 Hypertensive heart and chronic kidney disease with heart failure and stage 1 through stage 4 chronic kidney disease, or unspecified chronic kidney disease: Secondary | ICD-10-CM | POA: Diagnosis not present

## 2022-01-05 DIAGNOSIS — I5032 Chronic diastolic (congestive) heart failure: Secondary | ICD-10-CM | POA: Diagnosis not present

## 2022-01-05 DIAGNOSIS — E1122 Type 2 diabetes mellitus with diabetic chronic kidney disease: Secondary | ICD-10-CM | POA: Diagnosis not present

## 2022-01-05 DIAGNOSIS — W2209XD Striking against other stationary object, subsequent encounter: Secondary | ICD-10-CM | POA: Diagnosis not present

## 2022-01-05 DIAGNOSIS — S81811D Laceration without foreign body, right lower leg, subsequent encounter: Secondary | ICD-10-CM | POA: Diagnosis not present

## 2022-01-05 DIAGNOSIS — R238 Other skin changes: Secondary | ICD-10-CM | POA: Diagnosis not present

## 2022-01-06 ENCOUNTER — Ambulatory Visit: Payer: Medicare Other | Admitting: Podiatry

## 2022-01-08 ENCOUNTER — Encounter: Payer: Self-pay | Admitting: Podiatry

## 2022-01-08 ENCOUNTER — Ambulatory Visit (INDEPENDENT_AMBULATORY_CARE_PROVIDER_SITE_OTHER): Payer: Medicare Other | Admitting: Podiatry

## 2022-01-08 DIAGNOSIS — Q828 Other specified congenital malformations of skin: Secondary | ICD-10-CM

## 2022-01-08 DIAGNOSIS — B351 Tinea unguium: Secondary | ICD-10-CM | POA: Diagnosis not present

## 2022-01-08 DIAGNOSIS — W2209XD Striking against other stationary object, subsequent encounter: Secondary | ICD-10-CM | POA: Diagnosis not present

## 2022-01-08 DIAGNOSIS — S81811D Laceration without foreign body, right lower leg, subsequent encounter: Secondary | ICD-10-CM | POA: Diagnosis not present

## 2022-01-08 DIAGNOSIS — M79676 Pain in unspecified toe(s): Secondary | ICD-10-CM | POA: Diagnosis not present

## 2022-01-08 DIAGNOSIS — I130 Hypertensive heart and chronic kidney disease with heart failure and stage 1 through stage 4 chronic kidney disease, or unspecified chronic kidney disease: Secondary | ICD-10-CM | POA: Diagnosis not present

## 2022-01-08 DIAGNOSIS — E1122 Type 2 diabetes mellitus with diabetic chronic kidney disease: Secondary | ICD-10-CM | POA: Diagnosis not present

## 2022-01-08 DIAGNOSIS — I5032 Chronic diastolic (congestive) heart failure: Secondary | ICD-10-CM | POA: Diagnosis not present

## 2022-01-08 DIAGNOSIS — E1121 Type 2 diabetes mellitus with diabetic nephropathy: Secondary | ICD-10-CM | POA: Diagnosis not present

## 2022-01-08 DIAGNOSIS — R238 Other skin changes: Secondary | ICD-10-CM | POA: Diagnosis not present

## 2022-01-11 DIAGNOSIS — W2209XD Striking against other stationary object, subsequent encounter: Secondary | ICD-10-CM | POA: Diagnosis not present

## 2022-01-11 DIAGNOSIS — E1122 Type 2 diabetes mellitus with diabetic chronic kidney disease: Secondary | ICD-10-CM | POA: Diagnosis not present

## 2022-01-11 DIAGNOSIS — I5032 Chronic diastolic (congestive) heart failure: Secondary | ICD-10-CM | POA: Diagnosis not present

## 2022-01-11 DIAGNOSIS — R238 Other skin changes: Secondary | ICD-10-CM | POA: Diagnosis not present

## 2022-01-11 DIAGNOSIS — S81811D Laceration without foreign body, right lower leg, subsequent encounter: Secondary | ICD-10-CM | POA: Diagnosis not present

## 2022-01-11 DIAGNOSIS — I130 Hypertensive heart and chronic kidney disease with heart failure and stage 1 through stage 4 chronic kidney disease, or unspecified chronic kidney disease: Secondary | ICD-10-CM | POA: Diagnosis not present

## 2022-01-12 DIAGNOSIS — I130 Hypertensive heart and chronic kidney disease with heart failure and stage 1 through stage 4 chronic kidney disease, or unspecified chronic kidney disease: Secondary | ICD-10-CM | POA: Diagnosis not present

## 2022-01-12 DIAGNOSIS — W2209XD Striking against other stationary object, subsequent encounter: Secondary | ICD-10-CM | POA: Diagnosis not present

## 2022-01-12 DIAGNOSIS — I5032 Chronic diastolic (congestive) heart failure: Secondary | ICD-10-CM | POA: Diagnosis not present

## 2022-01-12 DIAGNOSIS — R238 Other skin changes: Secondary | ICD-10-CM | POA: Diagnosis not present

## 2022-01-12 DIAGNOSIS — S81811D Laceration without foreign body, right lower leg, subsequent encounter: Secondary | ICD-10-CM | POA: Diagnosis not present

## 2022-01-12 DIAGNOSIS — E1122 Type 2 diabetes mellitus with diabetic chronic kidney disease: Secondary | ICD-10-CM | POA: Diagnosis not present

## 2022-01-14 DIAGNOSIS — W2209XD Striking against other stationary object, subsequent encounter: Secondary | ICD-10-CM | POA: Diagnosis not present

## 2022-01-14 DIAGNOSIS — I130 Hypertensive heart and chronic kidney disease with heart failure and stage 1 through stage 4 chronic kidney disease, or unspecified chronic kidney disease: Secondary | ICD-10-CM | POA: Diagnosis not present

## 2022-01-14 DIAGNOSIS — R238 Other skin changes: Secondary | ICD-10-CM | POA: Diagnosis not present

## 2022-01-14 DIAGNOSIS — S81811D Laceration without foreign body, right lower leg, subsequent encounter: Secondary | ICD-10-CM | POA: Diagnosis not present

## 2022-01-14 DIAGNOSIS — E1122 Type 2 diabetes mellitus with diabetic chronic kidney disease: Secondary | ICD-10-CM | POA: Diagnosis not present

## 2022-01-14 DIAGNOSIS — I5032 Chronic diastolic (congestive) heart failure: Secondary | ICD-10-CM | POA: Diagnosis not present

## 2022-01-18 NOTE — Progress Notes (Signed)
  Subjective:  Patient ID: Tabitha Black, female    DOB: Aug 08, 1932,  MRN: [id number]  Tabitha Black presents to clinic today for at risk foot care. Pt has h/o NIDDM with chronic kidney disease and corn(s) right lower extremity and painful thick toenails that are difficult to trim. Painful toenails interfere with ambulation. Aggravating factors include wearing enclosed shoe gear. Pain is relieved with periodic professional debridement. Painful corns are aggravated when weightbearing when wearing enclosed shoe gear. Pain is relieved with periodic professional debridement.  Patient states blood glucose was 129 mg/dl today.  Last A1c was under 6.5%.  New problem(s): None.   PCP is [name], MD , and last visit was May 25, 2021.  Allergies  Allergen Reactions   Other Swelling    Topical mycin, unsure of which one, caused opthalmic swelling   Penicillins Shortness Of Breath, Itching, Swelling and Rash   Lovastatin Other (See Comments)    Weakness and myalgias, maybe to statins?   Azithromycin    Clindamycin    Hydralazine     Other reaction(s): felt foggy   Lisinopril Other (See Comments)    Hyperkalemia    Neomycin Sulfate [Neomycin] Other (See Comments)    Redness and burning in face   Niacin Hives    Other reaction(s): hives   Rofecoxib Other (See Comments)   Tetracycline    Valsartan Other (See Comments)    Hyperkalemia    Vytorin [Ezetimibe-Simvastatin] Other (See Comments)    myalgias   Zetia [Ezetimibe] Other (See Comments)    Leg pain    Review of Systems: Negative except as noted in the HPI.  Objective: No changes noted in today's physical examination.  Tabitha Black is a pleasant 88 y.o. female in NAD. AAO X 3.  Vascular Examination: Capillary refill time to digits immediate b/l. Palpable DP pulse(s) b/l LE. Palpable PT pulse(s) b/l LE. Pedal hair absent. No pain with calf compression b/l. +3 pitting edema BLE. Evidence of chronic venous insufficiency b/l  lower extremities.  Dermatological Examination: No open wounds b/l LE. No interdigital macerations noted b/l LE. Toenails 1-5 b/l elongated, discolored, dystrophic, thickened, crumbly with subungual debris and tenderness to dorsal palpation. Porokeratotic lesion(s) distal tip of right 3rd toe. No erythema, no edema, no drainage, no fluctuance.   Musculoskeletal Examination: Muscle strength 5/5 to all lower extremity muscle groups bilaterally. Hammertoe deformity noted 2-5 b/l. Pes planus deformity noted bilateral LE. Utilizes rollator for mobility assistance.  Neurological Examination: Protective sensation intact 5/5 intact bilaterally with 10g monofilament b/l. Vibratory sensation intact b/l.  Assessment/Plan: 1. Pain due to onychomycosis of toenail   2. Porokeratosis   3. Diabetic nephropathy associated with type 2 diabetes mellitus ([place])      -Patient was evaluated and treated. All patient's and/or POA's questions/concerns answered on today's visit. -Patient to continue soft, supportive shoe gear daily. -Mycotic toenails 1-5 bilaterally were debrided in length and girth with sterile nail nippers and dremel without incident. -Porokeratotic lesion(s) distal tip of right 3rd toe pared and enucleated with sterile scalpel blade without incident. Total number of lesions debrided=1. -Patient/POA to call should there be question/concern in the interim.   Return in about 3 months (around 04/10/2022).  Marzetta Board, DPM

## 2022-01-19 DIAGNOSIS — E1122 Type 2 diabetes mellitus with diabetic chronic kidney disease: Secondary | ICD-10-CM | POA: Diagnosis not present

## 2022-01-19 DIAGNOSIS — W2209XD Striking against other stationary object, subsequent encounter: Secondary | ICD-10-CM | POA: Diagnosis not present

## 2022-01-19 DIAGNOSIS — R238 Other skin changes: Secondary | ICD-10-CM | POA: Diagnosis not present

## 2022-01-19 DIAGNOSIS — I130 Hypertensive heart and chronic kidney disease with heart failure and stage 1 through stage 4 chronic kidney disease, or unspecified chronic kidney disease: Secondary | ICD-10-CM | POA: Diagnosis not present

## 2022-01-19 DIAGNOSIS — I5032 Chronic diastolic (congestive) heart failure: Secondary | ICD-10-CM | POA: Diagnosis not present

## 2022-01-19 DIAGNOSIS — S81811D Laceration without foreign body, right lower leg, subsequent encounter: Secondary | ICD-10-CM | POA: Diagnosis not present

## 2022-01-22 DIAGNOSIS — E1122 Type 2 diabetes mellitus with diabetic chronic kidney disease: Secondary | ICD-10-CM | POA: Diagnosis not present

## 2022-01-22 DIAGNOSIS — R238 Other skin changes: Secondary | ICD-10-CM | POA: Diagnosis not present

## 2022-01-22 DIAGNOSIS — W2209XD Striking against other stationary object, subsequent encounter: Secondary | ICD-10-CM | POA: Diagnosis not present

## 2022-01-22 DIAGNOSIS — S81811D Laceration without foreign body, right lower leg, subsequent encounter: Secondary | ICD-10-CM | POA: Diagnosis not present

## 2022-01-22 DIAGNOSIS — I130 Hypertensive heart and chronic kidney disease with heart failure and stage 1 through stage 4 chronic kidney disease, or unspecified chronic kidney disease: Secondary | ICD-10-CM | POA: Diagnosis not present

## 2022-01-22 DIAGNOSIS — I5032 Chronic diastolic (congestive) heart failure: Secondary | ICD-10-CM | POA: Diagnosis not present

## 2022-01-25 DIAGNOSIS — W2209XD Striking against other stationary object, subsequent encounter: Secondary | ICD-10-CM | POA: Diagnosis not present

## 2022-01-25 DIAGNOSIS — S81811D Laceration without foreign body, right lower leg, subsequent encounter: Secondary | ICD-10-CM | POA: Diagnosis not present

## 2022-01-25 DIAGNOSIS — I5032 Chronic diastolic (congestive) heart failure: Secondary | ICD-10-CM | POA: Diagnosis not present

## 2022-01-25 DIAGNOSIS — E1122 Type 2 diabetes mellitus with diabetic chronic kidney disease: Secondary | ICD-10-CM | POA: Diagnosis not present

## 2022-01-25 DIAGNOSIS — R238 Other skin changes: Secondary | ICD-10-CM | POA: Diagnosis not present

## 2022-01-25 DIAGNOSIS — I130 Hypertensive heart and chronic kidney disease with heart failure and stage 1 through stage 4 chronic kidney disease, or unspecified chronic kidney disease: Secondary | ICD-10-CM | POA: Diagnosis not present

## 2022-01-29 DIAGNOSIS — N184 Chronic kidney disease, stage 4 (severe): Secondary | ICD-10-CM | POA: Diagnosis not present

## 2022-01-29 DIAGNOSIS — E1151 Type 2 diabetes mellitus with diabetic peripheral angiopathy without gangrene: Secondary | ICD-10-CM | POA: Diagnosis not present

## 2022-01-29 DIAGNOSIS — Z9181 History of falling: Secondary | ICD-10-CM | POA: Diagnosis not present

## 2022-01-29 DIAGNOSIS — R238 Other skin changes: Secondary | ICD-10-CM | POA: Diagnosis not present

## 2022-01-29 DIAGNOSIS — I5032 Chronic diastolic (congestive) heart failure: Secondary | ICD-10-CM | POA: Diagnosis not present

## 2022-01-29 DIAGNOSIS — W2209XD Striking against other stationary object, subsequent encounter: Secondary | ICD-10-CM | POA: Diagnosis not present

## 2022-01-29 DIAGNOSIS — I872 Venous insufficiency (chronic) (peripheral): Secondary | ICD-10-CM | POA: Diagnosis not present

## 2022-01-29 DIAGNOSIS — K219 Gastro-esophageal reflux disease without esophagitis: Secondary | ICD-10-CM | POA: Diagnosis not present

## 2022-01-29 DIAGNOSIS — Z6831 Body mass index (BMI) 31.0-31.9, adult: Secondary | ICD-10-CM | POA: Diagnosis not present

## 2022-01-29 DIAGNOSIS — E782 Mixed hyperlipidemia: Secondary | ICD-10-CM | POA: Diagnosis not present

## 2022-01-29 DIAGNOSIS — S81811D Laceration without foreign body, right lower leg, subsequent encounter: Secondary | ICD-10-CM | POA: Diagnosis not present

## 2022-01-29 DIAGNOSIS — D631 Anemia in chronic kidney disease: Secondary | ICD-10-CM | POA: Diagnosis not present

## 2022-01-29 DIAGNOSIS — Z79891 Long term (current) use of opiate analgesic: Secondary | ICD-10-CM | POA: Diagnosis not present

## 2022-01-29 DIAGNOSIS — M1990 Unspecified osteoarthritis, unspecified site: Secondary | ICD-10-CM | POA: Diagnosis not present

## 2022-01-29 DIAGNOSIS — D692 Other nonthrombocytopenic purpura: Secondary | ICD-10-CM | POA: Diagnosis not present

## 2022-01-29 DIAGNOSIS — E1122 Type 2 diabetes mellitus with diabetic chronic kidney disease: Secondary | ICD-10-CM | POA: Diagnosis not present

## 2022-01-29 DIAGNOSIS — I130 Hypertensive heart and chronic kidney disease with heart failure and stage 1 through stage 4 chronic kidney disease, or unspecified chronic kidney disease: Secondary | ICD-10-CM | POA: Diagnosis not present

## 2022-02-02 DIAGNOSIS — R238 Other skin changes: Secondary | ICD-10-CM | POA: Diagnosis not present

## 2022-02-02 DIAGNOSIS — S81811D Laceration without foreign body, right lower leg, subsequent encounter: Secondary | ICD-10-CM | POA: Diagnosis not present

## 2022-02-02 DIAGNOSIS — E1122 Type 2 diabetes mellitus with diabetic chronic kidney disease: Secondary | ICD-10-CM | POA: Diagnosis not present

## 2022-02-02 DIAGNOSIS — I5032 Chronic diastolic (congestive) heart failure: Secondary | ICD-10-CM | POA: Diagnosis not present

## 2022-02-02 DIAGNOSIS — I130 Hypertensive heart and chronic kidney disease with heart failure and stage 1 through stage 4 chronic kidney disease, or unspecified chronic kidney disease: Secondary | ICD-10-CM | POA: Diagnosis not present

## 2022-02-02 DIAGNOSIS — N184 Chronic kidney disease, stage 4 (severe): Secondary | ICD-10-CM | POA: Diagnosis not present

## 2022-02-05 DIAGNOSIS — N184 Chronic kidney disease, stage 4 (severe): Secondary | ICD-10-CM | POA: Diagnosis not present

## 2022-02-05 DIAGNOSIS — I5032 Chronic diastolic (congestive) heart failure: Secondary | ICD-10-CM | POA: Diagnosis not present

## 2022-02-05 DIAGNOSIS — E1122 Type 2 diabetes mellitus with diabetic chronic kidney disease: Secondary | ICD-10-CM | POA: Diagnosis not present

## 2022-02-05 DIAGNOSIS — I130 Hypertensive heart and chronic kidney disease with heart failure and stage 1 through stage 4 chronic kidney disease, or unspecified chronic kidney disease: Secondary | ICD-10-CM | POA: Diagnosis not present

## 2022-02-05 DIAGNOSIS — S81811D Laceration without foreign body, right lower leg, subsequent encounter: Secondary | ICD-10-CM | POA: Diagnosis not present

## 2022-02-05 DIAGNOSIS — R238 Other skin changes: Secondary | ICD-10-CM | POA: Diagnosis not present

## 2022-02-09 DIAGNOSIS — N184 Chronic kidney disease, stage 4 (severe): Secondary | ICD-10-CM | POA: Diagnosis not present

## 2022-02-09 DIAGNOSIS — R238 Other skin changes: Secondary | ICD-10-CM | POA: Diagnosis not present

## 2022-02-09 DIAGNOSIS — S81811D Laceration without foreign body, right lower leg, subsequent encounter: Secondary | ICD-10-CM | POA: Diagnosis not present

## 2022-02-09 DIAGNOSIS — E1122 Type 2 diabetes mellitus with diabetic chronic kidney disease: Secondary | ICD-10-CM | POA: Diagnosis not present

## 2022-02-09 DIAGNOSIS — I5032 Chronic diastolic (congestive) heart failure: Secondary | ICD-10-CM | POA: Diagnosis not present

## 2022-02-09 DIAGNOSIS — I130 Hypertensive heart and chronic kidney disease with heart failure and stage 1 through stage 4 chronic kidney disease, or unspecified chronic kidney disease: Secondary | ICD-10-CM | POA: Diagnosis not present

## 2022-02-11 DIAGNOSIS — E1122 Type 2 diabetes mellitus with diabetic chronic kidney disease: Secondary | ICD-10-CM | POA: Diagnosis not present

## 2022-02-11 DIAGNOSIS — N184 Chronic kidney disease, stage 4 (severe): Secondary | ICD-10-CM | POA: Diagnosis not present

## 2022-02-11 DIAGNOSIS — R238 Other skin changes: Secondary | ICD-10-CM | POA: Diagnosis not present

## 2022-02-11 DIAGNOSIS — I5032 Chronic diastolic (congestive) heart failure: Secondary | ICD-10-CM | POA: Diagnosis not present

## 2022-02-11 DIAGNOSIS — I130 Hypertensive heart and chronic kidney disease with heart failure and stage 1 through stage 4 chronic kidney disease, or unspecified chronic kidney disease: Secondary | ICD-10-CM | POA: Diagnosis not present

## 2022-02-11 DIAGNOSIS — S81811D Laceration without foreign body, right lower leg, subsequent encounter: Secondary | ICD-10-CM | POA: Diagnosis not present

## 2022-02-16 DIAGNOSIS — I5032 Chronic diastolic (congestive) heart failure: Secondary | ICD-10-CM | POA: Diagnosis not present

## 2022-02-16 DIAGNOSIS — S81811D Laceration without foreign body, right lower leg, subsequent encounter: Secondary | ICD-10-CM | POA: Diagnosis not present

## 2022-02-16 DIAGNOSIS — N184 Chronic kidney disease, stage 4 (severe): Secondary | ICD-10-CM | POA: Diagnosis not present

## 2022-02-16 DIAGNOSIS — E1122 Type 2 diabetes mellitus with diabetic chronic kidney disease: Secondary | ICD-10-CM | POA: Diagnosis not present

## 2022-02-16 DIAGNOSIS — I130 Hypertensive heart and chronic kidney disease with heart failure and stage 1 through stage 4 chronic kidney disease, or unspecified chronic kidney disease: Secondary | ICD-10-CM | POA: Diagnosis not present

## 2022-02-16 DIAGNOSIS — R238 Other skin changes: Secondary | ICD-10-CM | POA: Diagnosis not present

## 2022-02-17 DIAGNOSIS — R238 Other skin changes: Secondary | ICD-10-CM | POA: Diagnosis not present

## 2022-02-17 DIAGNOSIS — I130 Hypertensive heart and chronic kidney disease with heart failure and stage 1 through stage 4 chronic kidney disease, or unspecified chronic kidney disease: Secondary | ICD-10-CM | POA: Diagnosis not present

## 2022-02-17 DIAGNOSIS — I5032 Chronic diastolic (congestive) heart failure: Secondary | ICD-10-CM | POA: Diagnosis not present

## 2022-02-17 DIAGNOSIS — E1122 Type 2 diabetes mellitus with diabetic chronic kidney disease: Secondary | ICD-10-CM | POA: Diagnosis not present

## 2022-02-17 DIAGNOSIS — N184 Chronic kidney disease, stage 4 (severe): Secondary | ICD-10-CM | POA: Diagnosis not present

## 2022-02-17 DIAGNOSIS — S81811D Laceration without foreign body, right lower leg, subsequent encounter: Secondary | ICD-10-CM | POA: Diagnosis not present

## 2022-02-26 DIAGNOSIS — I130 Hypertensive heart and chronic kidney disease with heart failure and stage 1 through stage 4 chronic kidney disease, or unspecified chronic kidney disease: Secondary | ICD-10-CM | POA: Diagnosis not present

## 2022-02-26 DIAGNOSIS — R238 Other skin changes: Secondary | ICD-10-CM | POA: Diagnosis not present

## 2022-02-26 DIAGNOSIS — E1122 Type 2 diabetes mellitus with diabetic chronic kidney disease: Secondary | ICD-10-CM | POA: Diagnosis not present

## 2022-02-26 DIAGNOSIS — I5032 Chronic diastolic (congestive) heart failure: Secondary | ICD-10-CM | POA: Diagnosis not present

## 2022-02-26 DIAGNOSIS — S81811D Laceration without foreign body, right lower leg, subsequent encounter: Secondary | ICD-10-CM | POA: Diagnosis not present

## 2022-02-26 DIAGNOSIS — N184 Chronic kidney disease, stage 4 (severe): Secondary | ICD-10-CM | POA: Diagnosis not present

## 2022-02-28 DIAGNOSIS — K219 Gastro-esophageal reflux disease without esophagitis: Secondary | ICD-10-CM | POA: Diagnosis not present

## 2022-02-28 DIAGNOSIS — E1151 Type 2 diabetes mellitus with diabetic peripheral angiopathy without gangrene: Secondary | ICD-10-CM | POA: Diagnosis not present

## 2022-02-28 DIAGNOSIS — E1122 Type 2 diabetes mellitus with diabetic chronic kidney disease: Secondary | ICD-10-CM | POA: Diagnosis not present

## 2022-02-28 DIAGNOSIS — N184 Chronic kidney disease, stage 4 (severe): Secondary | ICD-10-CM | POA: Diagnosis not present

## 2022-02-28 DIAGNOSIS — Z6831 Body mass index (BMI) 31.0-31.9, adult: Secondary | ICD-10-CM | POA: Diagnosis not present

## 2022-02-28 DIAGNOSIS — D692 Other nonthrombocytopenic purpura: Secondary | ICD-10-CM | POA: Diagnosis not present

## 2022-02-28 DIAGNOSIS — Z9181 History of falling: Secondary | ICD-10-CM | POA: Diagnosis not present

## 2022-02-28 DIAGNOSIS — I5032 Chronic diastolic (congestive) heart failure: Secondary | ICD-10-CM | POA: Diagnosis not present

## 2022-02-28 DIAGNOSIS — Z79891 Long term (current) use of opiate analgesic: Secondary | ICD-10-CM | POA: Diagnosis not present

## 2022-02-28 DIAGNOSIS — R238 Other skin changes: Secondary | ICD-10-CM | POA: Diagnosis not present

## 2022-02-28 DIAGNOSIS — I872 Venous insufficiency (chronic) (peripheral): Secondary | ICD-10-CM | POA: Diagnosis not present

## 2022-02-28 DIAGNOSIS — D631 Anemia in chronic kidney disease: Secondary | ICD-10-CM | POA: Diagnosis not present

## 2022-02-28 DIAGNOSIS — E782 Mixed hyperlipidemia: Secondary | ICD-10-CM | POA: Diagnosis not present

## 2022-02-28 DIAGNOSIS — M1990 Unspecified osteoarthritis, unspecified site: Secondary | ICD-10-CM | POA: Diagnosis not present

## 2022-02-28 DIAGNOSIS — S81811D Laceration without foreign body, right lower leg, subsequent encounter: Secondary | ICD-10-CM | POA: Diagnosis not present

## 2022-02-28 DIAGNOSIS — I130 Hypertensive heart and chronic kidney disease with heart failure and stage 1 through stage 4 chronic kidney disease, or unspecified chronic kidney disease: Secondary | ICD-10-CM | POA: Diagnosis not present

## 2022-02-28 DIAGNOSIS — W2209XD Striking against other stationary object, subsequent encounter: Secondary | ICD-10-CM | POA: Diagnosis not present

## 2022-03-02 DIAGNOSIS — S81811D Laceration without foreign body, right lower leg, subsequent encounter: Secondary | ICD-10-CM | POA: Diagnosis not present

## 2022-03-02 DIAGNOSIS — R238 Other skin changes: Secondary | ICD-10-CM | POA: Diagnosis not present

## 2022-03-02 DIAGNOSIS — I130 Hypertensive heart and chronic kidney disease with heart failure and stage 1 through stage 4 chronic kidney disease, or unspecified chronic kidney disease: Secondary | ICD-10-CM | POA: Diagnosis not present

## 2022-03-02 DIAGNOSIS — E1122 Type 2 diabetes mellitus with diabetic chronic kidney disease: Secondary | ICD-10-CM | POA: Diagnosis not present

## 2022-03-02 DIAGNOSIS — N184 Chronic kidney disease, stage 4 (severe): Secondary | ICD-10-CM | POA: Diagnosis not present

## 2022-03-02 DIAGNOSIS — I5032 Chronic diastolic (congestive) heart failure: Secondary | ICD-10-CM | POA: Diagnosis not present

## 2022-03-05 DIAGNOSIS — R238 Other skin changes: Secondary | ICD-10-CM | POA: Diagnosis not present

## 2022-03-05 DIAGNOSIS — E1122 Type 2 diabetes mellitus with diabetic chronic kidney disease: Secondary | ICD-10-CM | POA: Diagnosis not present

## 2022-03-05 DIAGNOSIS — I130 Hypertensive heart and chronic kidney disease with heart failure and stage 1 through stage 4 chronic kidney disease, or unspecified chronic kidney disease: Secondary | ICD-10-CM | POA: Diagnosis not present

## 2022-03-05 DIAGNOSIS — N184 Chronic kidney disease, stage 4 (severe): Secondary | ICD-10-CM | POA: Diagnosis not present

## 2022-03-05 DIAGNOSIS — S81811D Laceration without foreign body, right lower leg, subsequent encounter: Secondary | ICD-10-CM | POA: Diagnosis not present

## 2022-03-05 DIAGNOSIS — I5032 Chronic diastolic (congestive) heart failure: Secondary | ICD-10-CM | POA: Diagnosis not present

## 2022-03-09 DIAGNOSIS — S81811D Laceration without foreign body, right lower leg, subsequent encounter: Secondary | ICD-10-CM | POA: Diagnosis not present

## 2022-03-09 DIAGNOSIS — I5032 Chronic diastolic (congestive) heart failure: Secondary | ICD-10-CM | POA: Diagnosis not present

## 2022-03-09 DIAGNOSIS — E1122 Type 2 diabetes mellitus with diabetic chronic kidney disease: Secondary | ICD-10-CM | POA: Diagnosis not present

## 2022-03-09 DIAGNOSIS — I130 Hypertensive heart and chronic kidney disease with heart failure and stage 1 through stage 4 chronic kidney disease, or unspecified chronic kidney disease: Secondary | ICD-10-CM | POA: Diagnosis not present

## 2022-03-09 DIAGNOSIS — N184 Chronic kidney disease, stage 4 (severe): Secondary | ICD-10-CM | POA: Diagnosis not present

## 2022-03-09 DIAGNOSIS — R238 Other skin changes: Secondary | ICD-10-CM | POA: Diagnosis not present

## 2022-03-16 DIAGNOSIS — E1122 Type 2 diabetes mellitus with diabetic chronic kidney disease: Secondary | ICD-10-CM | POA: Diagnosis not present

## 2022-03-16 DIAGNOSIS — S81811D Laceration without foreign body, right lower leg, subsequent encounter: Secondary | ICD-10-CM | POA: Diagnosis not present

## 2022-03-16 DIAGNOSIS — N184 Chronic kidney disease, stage 4 (severe): Secondary | ICD-10-CM | POA: Diagnosis not present

## 2022-03-16 DIAGNOSIS — I5032 Chronic diastolic (congestive) heart failure: Secondary | ICD-10-CM | POA: Diagnosis not present

## 2022-03-16 DIAGNOSIS — R238 Other skin changes: Secondary | ICD-10-CM | POA: Diagnosis not present

## 2022-03-16 DIAGNOSIS — I130 Hypertensive heart and chronic kidney disease with heart failure and stage 1 through stage 4 chronic kidney disease, or unspecified chronic kidney disease: Secondary | ICD-10-CM | POA: Diagnosis not present

## 2022-03-26 DIAGNOSIS — E1122 Type 2 diabetes mellitus with diabetic chronic kidney disease: Secondary | ICD-10-CM | POA: Diagnosis not present

## 2022-03-26 DIAGNOSIS — R238 Other skin changes: Secondary | ICD-10-CM | POA: Diagnosis not present

## 2022-03-26 DIAGNOSIS — S81811D Laceration without foreign body, right lower leg, subsequent encounter: Secondary | ICD-10-CM | POA: Diagnosis not present

## 2022-03-26 DIAGNOSIS — N184 Chronic kidney disease, stage 4 (severe): Secondary | ICD-10-CM | POA: Diagnosis not present

## 2022-03-26 DIAGNOSIS — I5032 Chronic diastolic (congestive) heart failure: Secondary | ICD-10-CM | POA: Diagnosis not present

## 2022-03-26 DIAGNOSIS — I130 Hypertensive heart and chronic kidney disease with heart failure and stage 1 through stage 4 chronic kidney disease, or unspecified chronic kidney disease: Secondary | ICD-10-CM | POA: Diagnosis not present

## 2022-03-29 ENCOUNTER — Other Ambulatory Visit: Payer: Self-pay | Admitting: Family Medicine

## 2022-03-29 DIAGNOSIS — Z1231 Encounter for screening mammogram for malignant neoplasm of breast: Secondary | ICD-10-CM

## 2022-03-31 ENCOUNTER — Other Ambulatory Visit: Payer: Self-pay | Admitting: Cardiovascular Disease

## 2022-04-06 DIAGNOSIS — N184 Chronic kidney disease, stage 4 (severe): Secondary | ICD-10-CM | POA: Diagnosis not present

## 2022-04-12 DIAGNOSIS — N2581 Secondary hyperparathyroidism of renal origin: Secondary | ICD-10-CM | POA: Diagnosis not present

## 2022-04-12 DIAGNOSIS — E1122 Type 2 diabetes mellitus with diabetic chronic kidney disease: Secondary | ICD-10-CM | POA: Diagnosis not present

## 2022-04-12 DIAGNOSIS — D631 Anemia in chronic kidney disease: Secondary | ICD-10-CM | POA: Diagnosis not present

## 2022-04-12 DIAGNOSIS — I129 Hypertensive chronic kidney disease with stage 1 through stage 4 chronic kidney disease, or unspecified chronic kidney disease: Secondary | ICD-10-CM | POA: Diagnosis not present

## 2022-04-12 DIAGNOSIS — N184 Chronic kidney disease, stage 4 (severe): Secondary | ICD-10-CM | POA: Diagnosis not present

## 2022-04-19 ENCOUNTER — Ambulatory Visit (INDEPENDENT_AMBULATORY_CARE_PROVIDER_SITE_OTHER): Payer: Medicare Other

## 2022-04-19 ENCOUNTER — Ambulatory Visit (INDEPENDENT_AMBULATORY_CARE_PROVIDER_SITE_OTHER): Payer: Medicare Other | Admitting: Podiatry

## 2022-04-19 ENCOUNTER — Encounter: Payer: Self-pay | Admitting: Podiatry

## 2022-04-19 ENCOUNTER — Telehealth: Payer: Self-pay

## 2022-04-19 DIAGNOSIS — M79676 Pain in unspecified toe(s): Secondary | ICD-10-CM

## 2022-04-19 DIAGNOSIS — L97521 Non-pressure chronic ulcer of other part of left foot limited to breakdown of skin: Secondary | ICD-10-CM

## 2022-04-19 DIAGNOSIS — B351 Tinea unguium: Secondary | ICD-10-CM | POA: Diagnosis not present

## 2022-04-19 DIAGNOSIS — E1121 Type 2 diabetes mellitus with diabetic nephropathy: Secondary | ICD-10-CM | POA: Diagnosis not present

## 2022-04-19 DIAGNOSIS — Q828 Other specified congenital malformations of skin: Secondary | ICD-10-CM | POA: Diagnosis not present

## 2022-04-19 DIAGNOSIS — E11621 Type 2 diabetes mellitus with foot ulcer: Secondary | ICD-10-CM

## 2022-04-19 DIAGNOSIS — L03119 Cellulitis of unspecified part of limb: Secondary | ICD-10-CM | POA: Diagnosis not present

## 2022-04-19 MED ORDER — SULFAMETHOXAZOLE-TRIMETHOPRIM 800-160 MG PO TABS: 1.0000 | ORAL_TABLET | Freq: Two times a day (BID) | ORAL | 0 refills | Status: DC

## 2022-04-19 MED ORDER — MUPIROCIN 2 % EX OINT: TOPICAL_OINTMENT | CUTANEOUS | 1 refills | Status: DC

## 2022-04-19 NOTE — Telephone Encounter (Signed)
Tabitha Black was notified that her X-ray looked fine.

## 2022-04-19 NOTE — Patient Instructions (Signed)
DRESSING CHANGES LEFT 4TH TOE:   PHARMACY SHOPPING LIST: Saline or Wound Cleanser for cleaning wound 2 x 2 inch sterile gauze for cleaning wound MUPIROCIN OINTMENT  IF PRESCRIBED ORAL ANTIBIOTICS, TAKE ALL MEDICATION AS PRESCRIBED UNTIL ALL ARE GONE.   1. KEEP LEFT FOOT DRY AT ALL TIMES!!!!  2. CLEANSE ULCER WITH SALINE OR WOUND CLEANSER.  3. DAB DRY WITH GAUZE SPONGE.  4. APPLY A LIGHT AMOUNT OF MUPIROCIN OINTMENT TO BASE OF ULCER.  5. APPLY OUTER DRESSING AS INSTRUCTED.  6. WEAR SURGICAL SHOE/BOOT DAILY AT ALL TIMES. IF SUPPLIED, WEAR HEEL PROTECTORS AT ALL TIMES WHEN IN BED.  7. DO NOT WALK BAREFOOT!!!  8.  IF YOU EXPERIENCE ANY FEVER, CHILLS, NIGHTSWEATS, NAUSEA OR VOMITING, ELEVATED OR LOW BLOOD SUGARS, REPORT TO EMERGENCY ROOM.  9. IF YOU EXPERIENCE INCREASED REDNESS, PAIN, SWELLING, DISCOLORATION, ODOR, PUS, DRAINAGE OR WARMTH OF YOUR FOOT, REPORT TO EMERGENCY ROOM.

## 2022-04-19 NOTE — Progress Notes (Signed)
Subjective:  Patient ID: Tabitha Black, female    DOB: 1933-04-07,  MRN: [id number]  Tabitha Black presents to clinic today for:  Chief Complaint  Patient presents with   Nail Problem    Diabetic foot care [id number] A1C-6.5? PCP-[facility] PCP VST-09/2021     Patient presents for at risk foot care. Pt has h/o NIDDM with chronic kidney disease and painful porokeratotic lesion(s) right lower extremity and painful mycotic toenails that limit ambulation. Painful toenails interfere with ambulation. Aggravating factors include wearing enclosed shoe gear. Pain is relieved with periodic professional debridement. Painful porokeratotic lesions are aggravated when weightbearing with and without shoegear. Pain is relieved with periodic professional debridement.  New complaint painful left 4th toe for the past two weeks. She relates it is painful with weightbearing. She denies any redness, drainage or swelling from digit. Denies any fever, chills night sweats nausea or vomiting.  She does have chronic lower extremity edema and states she has been getting her legs wrapped. The last wrap was to the level of her midfoot. She has noticed increased swelling in her left foot. She denies any shortness of breath with the increased swelling in her legs.           PCP is [name], MD , and last visit was March, 2023.  Allergies  Allergen Reactions   Other Swelling    Topical mycin, unsure of which one, caused opthalmic swelling   Penicillins Shortness Of Breath, Itching, Swelling, Rash and Other (See Comments)   Lovastatin Other (See Comments)    Weakness and myalgias, maybe to statins?   Azithromycin    Clindamycin    Hydralazine     Other reaction(s): felt foggy   Lisinopril Other (See Comments)    Hyperkalemia    Neomycin Sulfate [Neomycin] Other (See Comments)    Redness and burning in face   Niacin Hives    Other reaction(s): hives   Rofecoxib Other (See Comments)   Tetracycline     Valsartan Other (See Comments)    Hyperkalemia    Vytorin [Ezetimibe-Simvastatin] Other (See Comments)    myalgias   Zetia [Ezetimibe] Other (See Comments)    Leg pain    Review of Systems: Negative except as noted in the HPI.  Objective: No changes noted in today's physical examination.  Tabitha Black is a pleasant 88 y.o. female morbidly obese in NAD. AAO x 3.  Vascular Examination: Capillary refill time to digits immediate b/l. Palpable DP pulse(s) b/l LE. Palpable PT pulse(s) b/l LE. Pedal hair absent. No pain with calf compression b/l. +3 pitting edema BLE. Evidence of chronic venous insufficiency b/l lower extremities.  Dermatological Examination: No interdigital macerations noted b/l LE. Toenails 1-5 b/l elongated, discolored, dystrophic, thickened, crumbly with subungual debris and tenderness to dorsal palpation. Porokeratotic lesion(s) distal tip of right 3rd toe. No erythema, no edema, no drainage, no fluctuance.           Wound Location: distal tip of left 4th toe There is a minimal amount of devitalized tissue present in the wound. Predebridement Wound Measurement:  0.2  x 0.2 cm with hyperkeratotic roof Postdebridement Wound Measurement: 0.4 x 0.3 x 0.1 cm. Wound Base: Granular/Healthy Peri-wound: Normal Exudate: None: wound tissue dry Blood Loss during debridement: 0 cc('s). Material in wound which inhibits healing/promotes adjacent tissue breakdown:  nonviable hyperkeratosis. Description of tissue removed from ulceration today:  nonviable hyperkeratosis. Sign(s) of clinical bacterial infection: no clinical signs of infection noted on examination today.  Musculoskeletal Examination: Muscle strength 5/5 to all lower extremity muscle groups bilaterally. Hammertoe deformity noted 2-5 b/l. Pes planus deformity noted bilateral LE. Utilizes rollator for mobility assistance.  Neurological Examination: Protective sensation intact 5/5 intact bilaterally with 10g  monofilament b/l. Vibratory sensation intact b/l.  Xray findings left foot: No gas in tissues left lower extremity. Soft tissue swelling present left lower extremity. No bone erosion noted at location of ulceration L 4th toe. No evidence of fracture left foot. No foreign body evident left foot.   Assessment/Plan: 1. Pain due to onychomycosis of toenail   2. Porokeratosis   3. Cellulitis of foot   4. Diabetic nephropathy associated with type 2 diabetes mellitus ([place])   5. Diabetic ulcer of toe of left foot associated with type 2 diabetes mellitus, limited to breakdown of skin ([place])     Meds ordered this encounter  Medications   mupirocin ointment (BACTROBAN) 2 %    Sig: Apply to left 4th toe once daily.    Dispense:  30 g    Refill:  1   sulfamethoxazole-trimethoprim (BACTRIM DS) 800-160 MG tablet    Sig: Take 1 tablet by mouth 2 (two) times daily.    Dispense:  14 tablet    Refill:  0    -Consent given for treatment as described below: -Patient was advised to contact Dr. Andrew Au office regarding increased lower extremity edema. I do not think her ulceration warrants the increased edema in her left lower extremity. I did place her on oral antibiotics today. -Examined patient. -Patient was evaluated and treated and all questions answered.  -Patient/POA/Family member educated on diagnosis and treatment plan of routine ulcer debridement/wound care.  -Ulceration debridement achieved utilizing sharp excisional debridement with sterile scalpel blade.. Type/amount of devitalized tissue removed: nonviable hyperkeratosis -Today's ulcer size post-debridement: 0.4 x 0.3 x 0.1 cm. Patient noted improvement in symptoms after debridement and application of toe crest to left foot today. -Ulceration cleansed with wound cleanser. triple antibiotic ointment applied to base of ulceration and secured with light dressing. -Wound responded well to today's debridement. -Patient risk factors affecting  healing of ulcer: diabetes, chronic lower extremity edema, HTN, CHF, hyperlipidemia, diabetic renal disease, foot deformity -Tabitha Black given written instructions on daily wound care for L 4th toe ulceration. -Continue foot and shoe inspections daily. Monitor blood glucose per PCP/Endocrinologist's recommendations. -Toenails 1-5 b/l were debrided in length and girth with sterile nail nippers and dremel without iatrogenic bleeding.  -Porokeratotic lesion(s) R 4th toe pared and enucleated with sterile currette without incident. Total number of lesions debrided=1. -Patient/POA to call should there be question/concern in the interim.  -Patient to follow up with Dr. Daylene Katayama in one week for diabetic ulceration left 4th toe. Return in about 1 week (around 04/26/2022).  Marzetta Board, DPM

## 2022-04-23 ENCOUNTER — Other Ambulatory Visit: Payer: Self-pay | Admitting: Cardiovascular Disease

## 2022-04-28 ENCOUNTER — Ambulatory Visit (INDEPENDENT_AMBULATORY_CARE_PROVIDER_SITE_OTHER): Payer: Medicare Other | Admitting: Podiatry

## 2022-04-28 DIAGNOSIS — L97522 Non-pressure chronic ulcer of other part of left foot with fat layer exposed: Secondary | ICD-10-CM | POA: Diagnosis not present

## 2022-04-28 NOTE — Progress Notes (Signed)
Chief Complaint  Patient presents with   Follow-up    Patient is here for follow-up for ulcer on left foot 4th toe.the patient states that the ulcer is healing well.    HPI: 88 y.o. female presenting today for new complaint associated to possible wound to the left fourth toe.  Patient states that she noticed a wound to the left fourth toe a few weeks ago.  She denies a history of injury.  She has been applying antibiotic ointment and a light Band-Aid daily since noticing the wound.  Her daughter dressed the wound yesterday evening and did not notice the wound anymore however she still presents for further treatment and evaluation  Past Medical History:  Diagnosis Date   CKD (chronic kidney disease)    Diabetes mellitus without complication (HCC)    type II   GERD (gastroesophageal reflux disease)    High cholesterol    Hypertension    Osteoarthritis    Skin cancer, basal cell     Past Surgical History:  Procedure Laterality Date   ABDOMINAL HYSTERECTOMY N/A    APPENDECTOMY N/A 1953   BACK SURGERY     CHOLECYSTECTOMY     COLONOSCOPY N/A 02/22/2008   TOTAL KNEE ARTHROPLASTY Right    UMBILICAL HERNIA REPAIR N/A 04/02/2015   Procedure: LAPAROSCOPIC UMBILICAL HERNIA REPAIR WITH VENTRALIGHT MESH;  Surgeon: Ralene Ok, MD;  Location: [facility];  Service: General;  Laterality: N/A;    Allergies  Allergen Reactions   Other Swelling    Topical mycin, unsure of which one, caused opthalmic swelling   Penicillins Shortness Of Breath, Itching, Swelling, Rash and Other (See Comments)   Lovastatin Other (See Comments)    Weakness and myalgias, maybe to statins?   Azithromycin    Clindamycin    Hydralazine     Other reaction(s): felt foggy   Lisinopril Other (See Comments)    Hyperkalemia    Neomycin Sulfate [Neomycin] Other (See Comments)    Redness and burning in face   Niacin Hives    Other reaction(s): hives   Rofecoxib Other (See Comments)   Tetracycline    Valsartan Other  (See Comments)    Hyperkalemia    Vytorin [Ezetimibe-Simvastatin] Other (See Comments)    myalgias   Zetia [Ezetimibe] Other (See Comments)    Leg pain     Physical Exam: General: The patient is alert and oriented x3 in no acute distress.  Dermatology: Skin is warm, dry and supple bilateral lower extremities. Negative for open lesions or macerations.  No open wounds specifically to the fourth toe left foot  Vascular: Palpable pedal pulses bilaterally. Capillary refill within normal limits.  Negative for any significant edema or erythema  Neurological: Light touch and protective threshold grossly intact  Musculoskeletal Exam: No pedal deformities noted  Assessment: 1.  Wound fourth toe left foot; healed   Plan of Care:  1. Patient evaluated.  2.  Continue wearing good supportive shoes and sneakers.  Advised against going barefoot 3.  Return to clinic next scheduled appointment for routine foot care     Edrick Kins, DPM [facility]  Dr. Edrick Kins, DPM    [address], [place] [address]  Office [phone]  Fax [phone]

## 2022-05-04 ENCOUNTER — Ambulatory Visit
Admission: RE | Admit: 2022-05-04 | Discharge: 2022-05-04 | Disposition: A | Discharge status: Home / Self Care | Payer: Medicare Other | Source: Ambulatory Visit | Attending: Family Medicine | Admitting: Family Medicine

## 2022-05-04 DIAGNOSIS — Z1231 Encounter for screening mammogram for malignant neoplasm of breast: Secondary | ICD-10-CM | POA: Diagnosis not present

## 2022-05-05 ENCOUNTER — Other Ambulatory Visit: Payer: Self-pay | Admitting: Cardiovascular Disease

## 2022-05-21 ENCOUNTER — Other Ambulatory Visit: Payer: Self-pay | Admitting: Cardiovascular Disease

## 2022-05-31 NOTE — Progress Notes (Signed)
Cardiology Office Note    Date:  06/08/2022   ID:  Tabitha Black, DOB 1933-06-06, MRN [id number]  PCP:  Tabitha Arabian, MD  Cardiologist:  Dr. Johnsie Black  CC: follow up - diastolic CHF    HPI:  88 y.o.  With a history of HTN, HLD , DM, CDK and morbid obesity ? Diastolic dysfunction. She has had worsening dyspnea and renal failure. Has had elevated Cr with both ACE/ARB Echo reviewed from 11/18/16 EF 40-98% grade 2 diastolic dysfunction with estimated PA of 48 mmHg. ? 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 She is unaware Since she is on lopressor rates are Black Long discussion about anticoagulation She is willing to try. With her age and renal failure she will need low dose eliquis 2.5 mg bid She has not required iron infusions in a long time Will check her labs today   Past Medical History:  Diagnosis Date   CKD (chronic kidney disease)    Diabetes mellitus without complication (HCC)    type II   GERD (gastroesophageal reflux disease)    High cholesterol    Hypertension    Osteoarthritis    Skin cancer, basal cell     Past Surgical History:  Procedure Laterality Date   ABDOMINAL HYSTERECTOMY N/A    APPENDECTOMY N/A 1953   BACK SURGERY      CHOLECYSTECTOMY     COLONOSCOPY N/A 02/22/2008   TOTAL KNEE ARTHROPLASTY Right    UMBILICAL HERNIA REPAIR N/A 04/02/2015   Procedure: LAPAROSCOPIC UMBILICAL HERNIA REPAIR WITH VENTRALIGHT MESH;  Surgeon: Tabitha Ok, MD;  Location: [facility];  Service: General;  Laterality: N/A;    Current Medications: Outpatient Medications Prior to Visit  Medication Sig Dispense Refill   Cholecalciferol (VITAMIN D3) 1.25 MG (50000 UT) TABS Take 1 tablet by mouth daily.     cyclobenzaprine (FLEXERIL) 10 MG tablet Take 5 mg by mouth 3 (three) times daily as needed for muscle spasms.      doxazosin (CARDURA) 4 MG tablet doxazosin 4 mg tablet  Take 1 tablet every day by oral route.     fenofibrate 160 MG tablet Take 160 mg by mouth every evening.      furosemide (LASIX) 80 MG tablet furosemide 80 mg tablet  1 (ONE) TABLET BY MOUTH TWO TIMES DAILY, TAKE AT 8AM AND 2PM     gemfibrozil (LOPID) 600 MG tablet Take by mouth.     hydrALAZINE (APRESOLINE) 50 MG tablet hydralazine 50 mg tablet  TAKE 1 TABLET BY MOUTH THREE TIMES A DAY     HYDROcodone-acetaminophen (NORCO/VICODIN) 5-325 MG tablet Take 1 tablet by mouth daily.  Iron-FA-B Cmp-C-Biot-Probiotic (FUSION PLUS) CAPS Take 1 capsule by mouth daily.     metoprolol succinate (TOPROL-XL) 100 MG 24 hr tablet Take 100 mg by mouth every evening. Take with or immediately following a meal.     metoprolol tartrate (LOPRESSOR) 100 MG tablet Lopressor 100 mg tablet  Take 1 tablet twice a day by oral route.     Multiple Vitamins-Minerals (HAIR SKIN AND NAILS FORMULA) TABS 1 tablet     mupirocin ointment (BACTROBAN) 2 % Apply to left 4th toe once daily. 30 g 1   NIFEdipine (PROCARDIA XL/NIFEDICAL XL) 60 MG 24 hr tablet Take 60 mg by mouth daily.     sulfamethoxazole-trimethoprim (BACTRIM DS) 800-160 MG tablet Take 1 tablet by mouth 2 (two) times daily. 14 tablet 0   torsemide (DEMADEX) 20 MG tablet Take 1 tablet (20 mg total) by mouth 2 (two) times daily. Please attend  scheduled appointment for additional refills. 60 tablet 0   traMADol (ULTRAM) 50 MG tablet Take 50 mg by mouth 2 (two) times daily as needed.     aspirin EC 81 MG tablet Take 81 mg by mouth every other day.      NIFEdipine (PROCARDIA-XL/NIFEDICAL-XL) 30 MG 24 hr tablet TAKE 1 TABLET BY MOUTH EVERY DAY 90 tablet 2   No facility-administered medications prior to visit.     Allergies:   Other, Penicillins, Lovastatin, Azithromycin, Clindamycin, Hydralazine, Lisinopril, Neomycin sulfate [neomycin], Niacin, Rofecoxib, Tetracycline, Valsartan, Vytorin [ezetimibe-simvastatin], and Zetia [ezetimibe]   Social History   Socioeconomic History   Marital status: Married    Spouse name: Not on file   Number of children: Not on file   Years of education: Not on file   Highest education level: Not on file  Occupational History   Not on file  Tobacco Use   Smoking status: Never   Smokeless tobacco: Never  Substance and Sexual Activity   Alcohol use: Yes    Comment: twice a year   Drug use: No   Sexual activity: Not on file  Other Topics Concern   Not on file  Social History Narrative   Not on file   Social Determinants of Health   Financial Resource Strain: Not on file  Food Insecurity: Not on file  Transportation Needs: Not on file  Physical Activity: Not on file  Stress: Not on file  Social Connections: Not on file     Family History:  The patient's family history includes Breast cancer in her sister; Heart attack in her father; Heart failure in her mother; Hypertension in her mother; Prostate cancer in her father.     ROS:   Please see the history of present illness.    ROS All other systems reviewed and are negative.   PHYSICAL EXAM:   VS:  BP 124/72   Pulse 79   Ht '5\' 3"'$  (1.6 m)   Wt 205 lb 6.4 oz (93.2 kg)   SpO2 98%   BMI 36.38 kg/m     Affect appropriate Obese female  HEENT: normal Neck supple with no adenopathy JVP normal no bruits no thyromegaly Lungs clear  with no wheezing and good diaphragmatic motion Heart:  S1/S2 no murmur, no rub, gallop or click PMI normal Abdomen: benighn, BS positve, no tenderness, no AAA no bruit.  No HSM or HJR Distal pulses intact with no bruits Plus 3 bilateral edema lymphatic  Neuro non-focal Skin warm and dry No muscular weakness    Wt Readings from Last 3 Encounters:  06/08/22 205 lb 6.4 oz (93.2 kg)  03/11/21 198 lb (89.8 kg)  09/11/20 213 lb (96.6 kg)      Studies/Labs Reviewed:   EKG:     06/22/19 SR rate 66 chronic biphasic inferior lateral T wave changes 06/08/2022 SR rate 82 Inferolateral T wave changes chronic 06/08/2022 afib rate 79 nonspecific ST changes   Recent Labs: No results found for requested labs within last 365 days.   Lipid Panel No results found for: "CHOL", "TRIG", "HDL", "CHOLHDL", "VLDL", "LDLCALC", "LDLDIRECT"  Additional studies/ records that were reviewed today include:  2D ECHO: 11/18/2016 LV EF: 60% -   65% Study Conclusions - Left ventricle: The cavity size was normal. There appeared to   asymmetric hypertrophy of the LV apex. Systolic function was   normal. The estimated ejection fraction was in the range of 60%   to 65%. Wall motion was normal; there were no regional wall   motion abnormalities. Features are consistent with a pseudonormal   left ventricular filling pattern, with concomitant abnormal   relaxation and increased filling pressure (grade 2 diastolic   dysfunction). - Aortic valve: There was no stenosis. - Mitral valve: Mildly calcified annulus. There was trivial   regurgitation. - Left atrium: The atrium was moderately dilated. - Right ventricle: The cavity size was normal. Systolic function   was normal. - Tricuspid valve: Peak RV-RA gradient (S): 45 mm Hg. - Pulmonary arteries: PA peak pressure: 48 mm Hg (S). - Inferior vena cava: The vessel was normal in size. The   respirophasic diameter changes were in the normal range (= 50%),    consistent with normal central venous pressure. Impressions: - Normal LV size with asymmetric hypertrophy of the LV apex   (spade-shaped ventricle). This is suggestive of apical   hypertrophic cardiomyopathy. EF 60-65%. Normal RV size and   systolic function. No significant valvular abnormalities. Mild   pulmonary hypertension.     ASSESSMENT & PLAN:   Acute on chronic diastolic CHF:  Not clear that her edema has anything to due with heart  Better BP control low sodium diet Torsemide started 07/10/20   Some improvement   Dyspnea:  Weight loss and control of BP most important     Abnormal ECHO: ? Apical HOCM  cannot have MRI with gadolinium due to CRF Only Rx Needed is better BP control and monitoring of renal failure  HTN:  Unable to take hydralazine Unable to use ACE/ARB/Amlodipoine Procardia added 08/2020 F/u primary and renal   HLD: previously did not tolerate Lovastatin. On Co Q 10 and lipids have been moderately well controlled.  DMT2: HgA1c 6.1 most recently. Not on any medications for his  CKD: now followed by Dr. Posey Pronto. [place] no RAS  Baseline Cr around 2.3 07/25/20   Aldactone d/c Anemia requiring iron injections   Afib:  new diagnosis. Continue lopressor Check CBC/BMET start eliquis 2.5 mg bid given age and renal failure  Risks of bleeding vs stroke discussed Her husband died from complications from stroke and she would rather protect herself    CBC/BMET Elqiuis 2.5 bid  F/U 3 months     Jenkins Rouge

## 2022-06-01 DIAGNOSIS — M1990 Unspecified osteoarthritis, unspecified site: Secondary | ICD-10-CM | POA: Diagnosis not present

## 2022-06-01 DIAGNOSIS — D649 Anemia, unspecified: Secondary | ICD-10-CM | POA: Diagnosis not present

## 2022-06-01 DIAGNOSIS — R609 Edema, unspecified: Secondary | ICD-10-CM | POA: Diagnosis not present

## 2022-06-01 DIAGNOSIS — T466X5A Adverse effect of antihyperlipidemic and antiarteriosclerotic drugs, initial encounter: Secondary | ICD-10-CM | POA: Diagnosis not present

## 2022-06-01 DIAGNOSIS — E1122 Type 2 diabetes mellitus with diabetic chronic kidney disease: Secondary | ICD-10-CM | POA: Diagnosis not present

## 2022-06-01 DIAGNOSIS — E782 Mixed hyperlipidemia: Secondary | ICD-10-CM | POA: Diagnosis not present

## 2022-06-01 DIAGNOSIS — N184 Chronic kidney disease, stage 4 (severe): Secondary | ICD-10-CM | POA: Diagnosis not present

## 2022-06-01 DIAGNOSIS — I5032 Chronic diastolic (congestive) heart failure: Secondary | ICD-10-CM | POA: Diagnosis not present

## 2022-06-01 DIAGNOSIS — R54 Age-related physical debility: Secondary | ICD-10-CM | POA: Diagnosis not present

## 2022-06-01 DIAGNOSIS — I130 Hypertensive heart and chronic kidney disease with heart failure and stage 1 through stage 4 chronic kidney disease, or unspecified chronic kidney disease: Secondary | ICD-10-CM | POA: Diagnosis not present

## 2022-06-08 ENCOUNTER — Encounter: Payer: Self-pay | Admitting: Cardiovascular Disease

## 2022-06-08 ENCOUNTER — Ambulatory Visit: Payer: Medicare Other | Attending: Cardiovascular Disease | Admitting: Cardiovascular Disease

## 2022-06-08 ENCOUNTER — Other Ambulatory Visit: Payer: Medicare Other

## 2022-06-08 VITALS — BP 124/72 | HR 79 | Ht 63.0 in | Wt 205.4 lb

## 2022-06-08 DIAGNOSIS — I890 Lymphedema, not elsewhere classified: Secondary | ICD-10-CM | POA: Insufficient documentation

## 2022-06-08 DIAGNOSIS — N184 Chronic kidney disease, stage 4 (severe): Secondary | ICD-10-CM | POA: Insufficient documentation

## 2022-06-08 DIAGNOSIS — I5043 Acute on chronic combined systolic (congestive) and diastolic (congestive) heart failure: Secondary | ICD-10-CM | POA: Diagnosis not present

## 2022-06-08 DIAGNOSIS — I4891 Unspecified atrial fibrillation: Secondary | ICD-10-CM | POA: Insufficient documentation

## 2022-06-08 DIAGNOSIS — E782 Mixed hyperlipidemia: Secondary | ICD-10-CM | POA: Insufficient documentation

## 2022-06-08 MED ORDER — APIXABAN 2.5 MG PO TABS
2.5000 mg | ORAL_TABLET | Freq: Two times a day (BID) | ORAL | 3 refills | Status: DC
Start: 2022-06-08 — End: 2023-03-30

## 2022-06-08 NOTE — Patient Instructions (Signed)
Medication Instructions:  Stop taking aspirin. Start taking eliquis 2.5 mg twice daily.  *If you need a refill on your cardiac medications before your next appointment, please call your pharmacy*   Lab Work: Lab orders have been placed for a CBC, Platelets, and BMET. You can have these labs drawn at our office before you leave.   If you have labs (blood work) drawn today and your tests are completely normal, you will receive your results only by: [place] (if you have MyChart) OR A paper copy in the mail If you have any lab test that is abnormal or we need to change your treatment, we will call you to review the results.   Testing/Procedures: None.   Follow-Up: At [facility], you and your health needs are our priority.  As part of our continuing mission to provide you with exceptional heart care, we have created designated Provider Care Teams.  These Care Teams include your primary Cardiologist (physician) and Advanced Practice Providers (APPs -  Physician Assistants and Nurse Practitioners) who all work together to provide you with the care you need, when you need it.  We recommend signing up for the patient portal called "MyChart".  Sign up information is provided on this After Visit Summary.  MyChart is used to connect with patients for Virtual Visits (Telemedicine).  Patients are able to view lab/test results, encounter notes, upcoming appointments, etc.  Non-urgent messages can be sent to your provider as well.   To learn more about what you can do with MyChart, go to [email or web address].    Your next appointment:   3 month(s)  The format for your next appointment:   In Person  Provider:   Jenkins Rouge, MD      Important Information About Sugar

## 2022-06-09 ENCOUNTER — Telehealth: Payer: Self-pay

## 2022-06-09 DIAGNOSIS — I4891 Unspecified atrial fibrillation: Secondary | ICD-10-CM

## 2022-06-09 LAB — CBC WITH DIFFERENTIAL/PLATELET
Basophils Absolute: 0 10*3/uL (ref 0.0–0.2)
Basos: 1 %
EOS (ABSOLUTE): 0.3 10*3/uL (ref 0.0–0.4)
Eos: 7 %
Hematocrit: 29.1 % — ABNORMAL LOW (ref 34.0–46.6)
Hemoglobin: 9.7 g/dL — ABNORMAL LOW (ref 11.1–15.9)
Immature Grans (Abs): 0 10*3/uL (ref 0.0–0.1)
Immature Granulocytes: 0 %
Lymphocytes Absolute: 1.2 10*3/uL (ref 0.7–3.1)
Lymphs: 32 %
MCH: 30.5 pg (ref 26.6–33.0)
MCHC: 33.3 g/dL (ref 31.5–35.7)
MCV: 92 fL (ref 79–97)
Monocytes Absolute: 0.6 10*3/uL (ref 0.1–0.9)
Monocytes: 16 %
Neutrophils Absolute: 1.7 10*3/uL (ref 1.4–7.0)
Neutrophils: 44 %
Platelets: 139 10*3/uL — ABNORMAL LOW (ref 150–450)
RBC: 3.18 x10E6/uL — ABNORMAL LOW (ref 3.77–5.28)
RDW: 12.6 % (ref 11.7–15.4)
WBC: 3.7 10*3/uL (ref 3.4–10.8)

## 2022-06-09 LAB — BASIC METABOLIC PANEL
BUN/Creatinine Ratio: 19 (ref 12–28)
BUN: 44 mg/dL — ABNORMAL HIGH (ref 8–27)
CO2: 22 mmol/L (ref 20–29)
Calcium: 10.2 mg/dL (ref 8.7–10.3)
Chloride: 100 mmol/L (ref 96–106)
Creatinine, Ser: 2.32 mg/dL — ABNORMAL HIGH (ref 0.57–1.00)
Glucose: 103 mg/dL — ABNORMAL HIGH (ref 70–99)
Potassium: 4.1 mmol/L (ref 3.5–5.2)
Sodium: 141 mmol/L (ref 134–144)
eGFR: 20 mL/min/{1.73_m2} — ABNORMAL LOW (ref >59)

## 2022-06-09 NOTE — Telephone Encounter (Signed)
Lab results reviewed and showed hemoglobin of 9.7. Patient was seen in clinic yesterday and started on Eliquis 2.5 mg BID. Patient has history of iron deficiency anemia but has been stable with most recent hgb on 04/06/22 of 11.2. Patient does not have any current signs/symptoms of active bleeding. Spoke with DOD, Dr. Irish Lack who advises that the patient come back in one week for a recheck of CBC. If she has any signs/symptoms of active bleeding in the meantime she should stop Eliquis. Repeat labs have been scheduled and patient is aware.

## 2022-06-13 ENCOUNTER — Other Ambulatory Visit: Payer: Self-pay | Admitting: Cardiovascular Disease

## 2022-06-16 DIAGNOSIS — N189 Chronic kidney disease, unspecified: Secondary | ICD-10-CM | POA: Diagnosis not present

## 2022-06-24 ENCOUNTER — Encounter (HOSPITAL_COMMUNITY): Payer: Medicare Other

## 2022-06-24 ENCOUNTER — Other Ambulatory Visit (HOSPITAL_COMMUNITY): Payer: Self-pay | Admitting: *Deleted

## 2022-06-25 ENCOUNTER — Encounter (HOSPITAL_COMMUNITY)
Admission: RE | Admit: 2022-06-25 | Discharge: 2022-06-25 | Disposition: A | Discharge status: Home / Self Care | Payer: Medicare Other | Source: Ambulatory Visit | Attending: Nephrology | Admitting: Nephrology

## 2022-06-25 DIAGNOSIS — D631 Anemia in chronic kidney disease: Secondary | ICD-10-CM | POA: Insufficient documentation

## 2022-06-25 DIAGNOSIS — N189 Chronic kidney disease, unspecified: Secondary | ICD-10-CM | POA: Diagnosis not present

## 2022-06-25 MED ORDER — SODIUM CHLORIDE 0.9 % IV SOLN
510.0000 mg | INTRAVENOUS | Status: DC
  Administered 2022-06-25: 510 mg via INTRAVENOUS
  Filled 2022-06-25: qty 17

## 2022-07-01 ENCOUNTER — Encounter (HOSPITAL_COMMUNITY)
Admission: RE | Admit: 2022-07-01 | Discharge: 2022-07-01 | Disposition: A | Discharge status: Home / Self Care | Payer: Medicare Other | Source: Ambulatory Visit | Attending: Nephrology | Admitting: Nephrology

## 2022-07-01 DIAGNOSIS — N189 Chronic kidney disease, unspecified: Secondary | ICD-10-CM | POA: Diagnosis not present

## 2022-07-01 DIAGNOSIS — D631 Anemia in chronic kidney disease: Secondary | ICD-10-CM | POA: Diagnosis not present

## 2022-07-01 MED ORDER — SODIUM CHLORIDE 0.9 % IV SOLN
510.0000 mg | INTRAVENOUS | Status: DC
  Administered 2022-07-01: 510 mg via INTRAVENOUS
  Filled 2022-07-01: qty 510

## 2022-07-02 ENCOUNTER — Encounter (HOSPITAL_COMMUNITY): Payer: Medicare Other

## 2022-07-28 DIAGNOSIS — I890 Lymphedema, not elsewhere classified: Secondary | ICD-10-CM | POA: Diagnosis not present

## 2022-07-28 DIAGNOSIS — N184 Chronic kidney disease, stage 4 (severe): Secondary | ICD-10-CM | POA: Diagnosis not present

## 2022-07-28 DIAGNOSIS — I5032 Chronic diastolic (congestive) heart failure: Secondary | ICD-10-CM | POA: Diagnosis not present

## 2022-08-10 ENCOUNTER — Encounter: Payer: Self-pay | Admitting: Podiatry

## 2022-08-10 ENCOUNTER — Ambulatory Visit (INDEPENDENT_AMBULATORY_CARE_PROVIDER_SITE_OTHER): Payer: Medicare Other | Admitting: Podiatry

## 2022-08-10 VITALS — BP 155/73

## 2022-08-10 DIAGNOSIS — L84 Corns and callosities: Secondary | ICD-10-CM | POA: Diagnosis not present

## 2022-08-10 DIAGNOSIS — E119 Type 2 diabetes mellitus without complications: Secondary | ICD-10-CM

## 2022-08-10 DIAGNOSIS — Z8631 Personal history of diabetic foot ulcer: Secondary | ICD-10-CM | POA: Diagnosis not present

## 2022-08-10 DIAGNOSIS — M79676 Pain in unspecified toe(s): Secondary | ICD-10-CM

## 2022-08-10 DIAGNOSIS — M2041 Other hammer toe(s) (acquired), right foot: Secondary | ICD-10-CM

## 2022-08-10 DIAGNOSIS — Q828 Other specified congenital malformations of skin: Secondary | ICD-10-CM

## 2022-08-10 DIAGNOSIS — E1121 Type 2 diabetes mellitus with diabetic nephropathy: Secondary | ICD-10-CM | POA: Diagnosis not present

## 2022-08-10 DIAGNOSIS — M2042 Other hammer toe(s) (acquired), left foot: Secondary | ICD-10-CM

## 2022-08-10 DIAGNOSIS — B351 Tinea unguium: Secondary | ICD-10-CM

## 2022-08-10 NOTE — Progress Notes (Signed)
ANNUAL DIABETIC FOOT EXAM  Subjective: Tabitha Black presents today for annual diabetic foot examination.  Chief Complaint  Patient presents with   Nail Problem    DFC BS-106 A1C-Do not know  PCP-[facility] PCP VST-06/2022   Patient confirms h/o diabetes.  Patient relates she has issues with chronic edema b/l. Per chart review, she has OT referral for lymphedema. She has coban leg wrap on her left LE.  She has h/o toe ulcer left 4th digit.  Risk factors: diabetes, history of foot/leg ulcer, chronic lower extremity edema, HTN, CHF, CKD, hyperlipidemia, diabetic renal disease.  Tabitha Arabian, MD is patient's PCP.  Past Medical History:  Diagnosis Date   CKD (chronic kidney disease)    Diabetes mellitus without complication (HCC)    type II   GERD (gastroesophageal reflux disease)    High cholesterol    Hypertension    Osteoarthritis    Skin cancer, basal cell    Patient Active Problem List   Diagnosis Date Noted   Pain in joint of left knee 01/06/2021   Adverse effect of antihyperlipidemic and antiarteriosclerotic drugs, initial encounter 08/22/2020   Age-related physical debility 08/22/2020   Chronic congestive heart failure ([place]) 08/22/2020   Chronic kidney disease, stage 4 (severe) ([place]) 08/22/2020   Diabetic renal disease ([place]) 08/22/2020   Gastroesophageal reflux disease 08/22/2020   History of malignant neoplasm of skin 08/22/2020   Hypertensive heart and chronic kidney disease with heart failure and stage 1 through stage 4 chronic kidney disease, or unspecified chronic kidney disease ([place]) 08/22/2020   Iron deficiency anemia 08/22/2020   Mixed hyperlipidemia 08/22/2020   Morbid obesity ([place]) 08/22/2020   Osteoarthritis 08/22/2020   Personal history of colonic polyps 08/22/2020   Skin ulcer ([place]) 08/22/2020   Cervical spondylosis with radiculopathy 07/26/2017   Small bowel obstruction ([place]) 04/01/2015   Past Surgical History:  Procedure Laterality Date    ABDOMINAL HYSTERECTOMY N/A    APPENDECTOMY N/A 1953   BACK SURGERY     CHOLECYSTECTOMY     COLONOSCOPY N/A 02/22/2008   TOTAL KNEE ARTHROPLASTY Right    UMBILICAL HERNIA REPAIR N/A 04/02/2015   Procedure: LAPAROSCOPIC UMBILICAL HERNIA REPAIR WITH VENTRALIGHT MESH;  Surgeon: Ralene Ok, MD;  Location: [facility];  Service: General;  Laterality: N/A;   Current Outpatient Medications on File Prior to Visit  Medication Sig Dispense Refill   apixaban (ELIQUIS) 2.5 MG TABS tablet Take 1 tablet (2.5 mg total) by mouth 2 (two) times daily. 60 tablet 3   Cholecalciferol (VITAMIN D3) 1.25 MG (50000 UT) TABS Take 1 tablet by mouth daily.     cyclobenzaprine (FLEXERIL) 10 MG tablet Take 5 mg by mouth 3 (three) times daily as needed for muscle spasms.      doxazosin (CARDURA) 4 MG tablet doxazosin 4 mg tablet  Take 1 tablet every day by oral route.     fenofibrate 160 MG tablet Take 160 mg by mouth every evening.      furosemide (LASIX) 80 MG tablet furosemide 80 mg tablet  1 (ONE) TABLET BY MOUTH TWO TIMES DAILY, TAKE AT 8AM AND 2PM     gemfibrozil (LOPID) 600 MG tablet Take by mouth.     hydrALAZINE (APRESOLINE) 50 MG tablet hydralazine 50 mg tablet  TAKE 1 TABLET BY MOUTH THREE TIMES A DAY     HYDROcodone-acetaminophen (NORCO/VICODIN) 5-325 MG tablet Take 1 tablet by mouth daily.     Iron-FA-B Cmp-C-Biot-Probiotic (FUSION PLUS) CAPS Take 1 capsule by mouth daily.  metoprolol succinate (TOPROL-XL) 100 MG 24 hr tablet Take 100 mg by mouth every evening. Take with or immediately following a meal.     metoprolol tartrate (LOPRESSOR) 100 MG tablet Lopressor 100 mg tablet  Take 1 tablet twice a day by oral route.     Multiple Vitamins-Minerals (HAIR SKIN AND NAILS FORMULA) TABS 1 tablet     mupirocin ointment (BACTROBAN) 2 % Apply to left 4th toe once daily. 30 g 1   NIFEdipine (PROCARDIA XL/NIFEDICAL XL) 60 MG 24 hr tablet Take 60 mg by mouth daily.     sulfamethoxazole-trimethoprim (BACTRIM DS)  800-160 MG tablet Take 1 tablet by mouth 2 (two) times daily. 14 tablet 0   torsemide (DEMADEX) 20 MG tablet TAKE 1 TABLET (20 MG TOTAL) BY MOUTH 2 (TWO) TIMES DAILY. PLEASE ATTEND SCHEDULED APPOINTMENT FOR ADDITIONAL REFILLS. 180 tablet 3   traMADol (ULTRAM) 50 MG tablet Take 50 mg by mouth 2 (two) times daily as needed.     No current facility-administered medications on file prior to visit.    Allergies  Allergen Reactions   Other Swelling    Topical mycin, unsure of which one, caused opthalmic swelling   Penicillins Shortness Of Breath, Itching, Swelling, Rash and Other (See Comments)   Lovastatin Other (See Comments)    Weakness and myalgias, maybe to statins?   Azithromycin    Clindamycin    Hydralazine     Other reaction(s): felt foggy   Lisinopril Other (See Comments)    Hyperkalemia    Neomycin Sulfate [Neomycin] Other (See Comments)    Redness and burning in face   Niacin Hives    Other reaction(s): hives   Rofecoxib Other (See Comments)   Tetracycline    Valsartan Other (See Comments)    Hyperkalemia    Vytorin [Ezetimibe-Simvastatin] Other (See Comments)    myalgias   Zetia [Ezetimibe] Other (See Comments)    Leg pain   Social History   Occupational History   Not on file  Tobacco Use   Smoking status: Never   Smokeless tobacco: Never  Substance and Sexual Activity   Alcohol use: Yes    Comment: twice a year   Drug use: No   Sexual activity: Not on file   Family History  Problem Relation Age of Onset   Heart failure Mother    Hypertension Mother    Heart attack Father    Prostate cancer Father    Breast cancer Sister    Immunization History  Administered Date(s) Administered   Influenza, High Dose Seasonal PF 04/28/2018, 04/13/2019     Review of Systems: Negative except as noted in the HPI.   Objective: Vitals:   08/10/22 1120  BP: (!) 155/73    Tabitha Black is a pleasant 89 y.o. female in NAD. AAO X 3.  Vascular  Examination: Capillary refill time immediate b/l. Vascular status intact b/l with palpable pedal pulses. No pain with calf compression b/l. Skin temperature gradient WNL b/l. Lymphedema present BLE. No ischemia or gangrene noted b/l LE. No cyanosis or clubbing noted b/l LE.  Neurological Examination: Sensation grossly intact b/l with 10 gram monofilament. Vibratory sensation intact b/l.   Dermatological Examination: Pedal skin with normal turgor, texture and tone b/l.  No open pedal wounds. No interdigital macerations.   Toenails 1-5 b/l thick, discolored, elongated with subungual debris and pain on dorsal palpation.   Dressing noted left lower extremity.  Musculoskeletal Examination: Muscle strength 5/5 to all lower extremity muscle groups  bilaterally. Hammertoe deformity noted 2-5 b/l. Pes planus deformity noted bilateral LE.  Radiographs: None  Footwear Assessment: Does the patient wear appropriate shoes? Wearing house slippers. She cannot wear normal shoe gear due to chronic swelling. Does the patient need inserts/orthotics? No.  ADA Risk Categorization: High Risk  Patient has one or more of the following: Loss of protective sensation Absent pedal pulses Severe Foot deformity History of foot ulcer  Assessment: 1. Pain due to onychomycosis of toenail   2. Porokeratosis   3. Acquired hammertoes of both feet   4. History of diabetic ulcer of foot   5. Diabetic nephropathy associated with type 2 diabetes mellitus ([place])   6. Encounter for diabetic foot exam [facility])     Plan: -Patient was evaluated and treated. All patient's and/or POA's questions/concerns answered on today's visit. -For her pedal edema, I have recommended Dian Situ Double Depth shoes. She was given printed information to give to her daughter. -She has pending referral to OT ([place]?) for lymphedema evaluation. -Diabetic foot examination performed today. -Continue diabetic foot care principles: inspect feet daily,  monitor glucose as recommended by PCP and/or Endocrinologist, and follow prescribed diet per PCP, Endocrinologist and/or dietician. -Continue supportive shoe gear daily. -Patient/POA to call should there be question/concern in the interim. Return in about 3 months (around 11/09/2022).  Marzetta Board, DPM

## 2022-08-10 NOTE — Patient Instructions (Addendum)
[employer]rew Double Depth shoes for swollen feet. Get shoes that stretch over your feet.  [email or web address]

## 2022-08-18 DIAGNOSIS — N189 Chronic kidney disease, unspecified: Secondary | ICD-10-CM | POA: Diagnosis not present

## 2022-08-18 DIAGNOSIS — E1122 Type 2 diabetes mellitus with diabetic chronic kidney disease: Secondary | ICD-10-CM | POA: Diagnosis not present

## 2022-08-18 DIAGNOSIS — D631 Anemia in chronic kidney disease: Secondary | ICD-10-CM | POA: Diagnosis not present

## 2022-08-18 DIAGNOSIS — N184 Chronic kidney disease, stage 4 (severe): Secondary | ICD-10-CM | POA: Diagnosis not present

## 2022-08-18 DIAGNOSIS — I129 Hypertensive chronic kidney disease with stage 1 through stage 4 chronic kidney disease, or unspecified chronic kidney disease: Secondary | ICD-10-CM | POA: Diagnosis not present

## 2022-08-18 DIAGNOSIS — N2581 Secondary hyperparathyroidism of renal origin: Secondary | ICD-10-CM | POA: Diagnosis not present

## 2022-08-23 ENCOUNTER — Inpatient Hospital Stay (HOSPITAL_COMMUNITY)
Admission: EM | Admit: 2022-08-23 | Discharge: 2022-08-27 | DRG: 378 | Disposition: A | Discharge status: Skilled Nursing Facility | Payer: Medicare Other | Attending: Internal Medicine | Admitting: Internal Medicine

## 2022-08-23 ENCOUNTER — Encounter (HOSPITAL_COMMUNITY): Payer: Self-pay | Admitting: Emergency Medicine

## 2022-08-23 ENCOUNTER — Other Ambulatory Visit: Payer: Self-pay

## 2022-08-23 DIAGNOSIS — E876 Hypokalemia: Secondary | ICD-10-CM | POA: Diagnosis present

## 2022-08-23 DIAGNOSIS — K921 Melena: Secondary | ICD-10-CM | POA: Diagnosis present

## 2022-08-23 DIAGNOSIS — I4891 Unspecified atrial fibrillation: Secondary | ICD-10-CM | POA: Diagnosis not present

## 2022-08-23 DIAGNOSIS — Z6835 Body mass index (BMI) 35.0-35.9, adult: Secondary | ICD-10-CM

## 2022-08-23 DIAGNOSIS — K2900 Acute gastritis without bleeding: Secondary | ICD-10-CM | POA: Diagnosis not present

## 2022-08-23 DIAGNOSIS — D631 Anemia in chronic kidney disease: Secondary | ICD-10-CM | POA: Diagnosis not present

## 2022-08-23 DIAGNOSIS — K2970 Gastritis, unspecified, without bleeding: Secondary | ICD-10-CM | POA: Diagnosis not present

## 2022-08-23 DIAGNOSIS — I482 Chronic atrial fibrillation, unspecified: Secondary | ICD-10-CM | POA: Diagnosis present

## 2022-08-23 DIAGNOSIS — E1122 Type 2 diabetes mellitus with diabetic chronic kidney disease: Secondary | ICD-10-CM | POA: Diagnosis present

## 2022-08-23 DIAGNOSIS — Z881 Allergy status to other antibiotic agents status: Secondary | ICD-10-CM | POA: Diagnosis not present

## 2022-08-23 DIAGNOSIS — Z880 Allergy status to penicillin: Secondary | ICD-10-CM | POA: Diagnosis not present

## 2022-08-23 DIAGNOSIS — H9193 Unspecified hearing loss, bilateral: Secondary | ICD-10-CM | POA: Diagnosis not present

## 2022-08-23 DIAGNOSIS — M1990 Unspecified osteoarthritis, unspecified site: Secondary | ICD-10-CM | POA: Diagnosis not present

## 2022-08-23 DIAGNOSIS — K254 Chronic or unspecified gastric ulcer with hemorrhage: Principal | ICD-10-CM | POA: Diagnosis present

## 2022-08-23 DIAGNOSIS — R531 Weakness: Secondary | ICD-10-CM | POA: Diagnosis not present

## 2022-08-23 DIAGNOSIS — E119 Type 2 diabetes mellitus without complications: Secondary | ICD-10-CM

## 2022-08-23 DIAGNOSIS — I509 Heart failure, unspecified: Secondary | ICD-10-CM

## 2022-08-23 DIAGNOSIS — Z79899 Other long term (current) drug therapy: Secondary | ICD-10-CM

## 2022-08-23 DIAGNOSIS — Z85828 Personal history of other malignant neoplasm of skin: Secondary | ICD-10-CM | POA: Diagnosis not present

## 2022-08-23 DIAGNOSIS — Z8249 Family history of ischemic heart disease and other diseases of the circulatory system: Secondary | ICD-10-CM

## 2022-08-23 DIAGNOSIS — R58 Hemorrhage, not elsewhere classified: Secondary | ICD-10-CM | POA: Diagnosis not present

## 2022-08-23 DIAGNOSIS — K2971 Gastritis, unspecified, with bleeding: Secondary | ICD-10-CM | POA: Diagnosis not present

## 2022-08-23 DIAGNOSIS — Z803 Family history of malignant neoplasm of breast: Secondary | ICD-10-CM

## 2022-08-23 DIAGNOSIS — I890 Lymphedema, not elsewhere classified: Secondary | ICD-10-CM | POA: Diagnosis present

## 2022-08-23 DIAGNOSIS — E785 Hyperlipidemia, unspecified: Secondary | ICD-10-CM | POA: Diagnosis not present

## 2022-08-23 DIAGNOSIS — I872 Venous insufficiency (chronic) (peripheral): Secondary | ICD-10-CM | POA: Diagnosis not present

## 2022-08-23 DIAGNOSIS — D62 Acute posthemorrhagic anemia: Secondary | ICD-10-CM

## 2022-08-23 DIAGNOSIS — I10 Essential (primary) hypertension: Secondary | ICD-10-CM | POA: Diagnosis not present

## 2022-08-23 DIAGNOSIS — E1121 Type 2 diabetes mellitus with diabetic nephropathy: Secondary | ICD-10-CM | POA: Diagnosis not present

## 2022-08-23 DIAGNOSIS — E7800 Pure hypercholesterolemia, unspecified: Secondary | ICD-10-CM | POA: Diagnosis present

## 2022-08-23 DIAGNOSIS — I5032 Chronic diastolic (congestive) heart failure: Secondary | ICD-10-CM | POA: Diagnosis present

## 2022-08-23 DIAGNOSIS — I130 Hypertensive heart and chronic kidney disease with heart failure and stage 1 through stage 4 chronic kidney disease, or unspecified chronic kidney disease: Secondary | ICD-10-CM | POA: Diagnosis present

## 2022-08-23 DIAGNOSIS — I5030 Unspecified diastolic (congestive) heart failure: Secondary | ICD-10-CM

## 2022-08-23 DIAGNOSIS — K922 Gastrointestinal hemorrhage, unspecified: Secondary | ICD-10-CM | POA: Diagnosis not present

## 2022-08-23 DIAGNOSIS — D649 Anemia, unspecified: Secondary | ICD-10-CM

## 2022-08-23 DIAGNOSIS — Z888 Allergy status to other drugs, medicaments and biological substances status: Secondary | ICD-10-CM | POA: Diagnosis not present

## 2022-08-23 DIAGNOSIS — K259 Gastric ulcer, unspecified as acute or chronic, without hemorrhage or perforation: Secondary | ICD-10-CM | POA: Diagnosis not present

## 2022-08-23 DIAGNOSIS — I83008 Varicose veins of unspecified lower extremity with ulcer other part of lower leg: Secondary | ICD-10-CM | POA: Diagnosis not present

## 2022-08-23 DIAGNOSIS — I480 Paroxysmal atrial fibrillation: Secondary | ICD-10-CM | POA: Diagnosis present

## 2022-08-23 DIAGNOSIS — K219 Gastro-esophageal reflux disease without esophagitis: Secondary | ICD-10-CM | POA: Diagnosis not present

## 2022-08-23 DIAGNOSIS — I4819 Other persistent atrial fibrillation: Secondary | ICD-10-CM | POA: Diagnosis present

## 2022-08-23 DIAGNOSIS — R54 Age-related physical debility: Secondary | ICD-10-CM | POA: Diagnosis not present

## 2022-08-23 DIAGNOSIS — N184 Chronic kidney disease, stage 4 (severe): Secondary | ICD-10-CM | POA: Diagnosis present

## 2022-08-23 DIAGNOSIS — Z96651 Presence of right artificial knee joint: Secondary | ICD-10-CM | POA: Diagnosis present

## 2022-08-23 DIAGNOSIS — D509 Iron deficiency anemia, unspecified: Secondary | ICD-10-CM | POA: Diagnosis not present

## 2022-08-23 DIAGNOSIS — Z7401 Bed confinement status: Secondary | ICD-10-CM | POA: Diagnosis not present

## 2022-08-23 DIAGNOSIS — N189 Chronic kidney disease, unspecified: Secondary | ICD-10-CM | POA: Diagnosis not present

## 2022-08-23 DIAGNOSIS — R600 Localized edema: Secondary | ICD-10-CM | POA: Diagnosis not present

## 2022-08-23 DIAGNOSIS — R42 Dizziness and giddiness: Secondary | ICD-10-CM | POA: Diagnosis not present

## 2022-08-23 DIAGNOSIS — R52 Pain, unspecified: Secondary | ICD-10-CM | POA: Diagnosis not present

## 2022-08-23 DIAGNOSIS — E669 Obesity, unspecified: Secondary | ICD-10-CM | POA: Diagnosis present

## 2022-08-23 DIAGNOSIS — Z7901 Long term (current) use of anticoagulants: Secondary | ICD-10-CM | POA: Diagnosis not present

## 2022-08-23 DIAGNOSIS — K250 Acute gastric ulcer with hemorrhage: Secondary | ICD-10-CM | POA: Diagnosis not present

## 2022-08-23 DIAGNOSIS — N185 Chronic kidney disease, stage 5: Secondary | ICD-10-CM | POA: Diagnosis present

## 2022-08-23 DIAGNOSIS — K319 Disease of stomach and duodenum, unspecified: Secondary | ICD-10-CM | POA: Diagnosis not present

## 2022-08-23 DIAGNOSIS — E559 Vitamin D deficiency, unspecified: Secondary | ICD-10-CM | POA: Diagnosis not present

## 2022-08-23 DIAGNOSIS — M62838 Other muscle spasm: Secondary | ICD-10-CM | POA: Diagnosis not present

## 2022-08-23 HISTORY — DX: Melena: K92.1

## 2022-08-23 HISTORY — DX: Acute posthemorrhagic anemia: D62

## 2022-08-23 LAB — COMPREHENSIVE METABOLIC PANEL
ALT: 14 U/L (ref 0–44)
AST: 27 U/L (ref 15–41)
Albumin: 3.2 g/dL — ABNORMAL LOW (ref 3.5–5.0)
Alkaline Phosphatase: 69 U/L (ref 38–126)
Anion gap: 17 — ABNORMAL HIGH (ref 5–15)
BUN: 58 mg/dL — ABNORMAL HIGH (ref 8–23)
CO2: 17 mmol/L — ABNORMAL LOW (ref 22–32)
Calcium: 9.5 mg/dL (ref 8.9–10.3)
Chloride: 104 mmol/L (ref 98–111)
Creatinine, Ser: 2.59 mg/dL — ABNORMAL HIGH (ref 0.44–1.00)
GFR, Estimated: 17 mL/min — ABNORMAL LOW (ref >60)
Glucose, Bld: 121 mg/dL — ABNORMAL HIGH (ref 70–99)
Potassium: 3.8 mmol/L (ref 3.5–5.1)
Sodium: 138 mmol/L (ref 135–145)
Total Bilirubin: 1.1 mg/dL (ref 0.3–1.2)
Total Protein: 6.5 g/dL (ref 6.5–8.1)

## 2022-08-23 LAB — CBC WITH DIFFERENTIAL/PLATELET
Abs Immature Granulocytes: 0.02 10*3/uL (ref 0.00–0.07)
Basophils Absolute: 0 10*3/uL (ref 0.0–0.1)
Basophils Relative: 0 %
Eosinophils Absolute: 0.3 10*3/uL (ref 0.0–0.5)
Eosinophils Relative: 7 %
HCT: 20.1 % — ABNORMAL LOW (ref 36.0–46.0)
Hemoglobin: 6.6 g/dL — CL (ref 12.0–15.0)
Immature Granulocytes: 0 %
Lymphocytes Relative: 17 %
Lymphs Abs: 0.7 10*3/uL (ref 0.7–4.0)
MCH: 31.4 pg (ref 26.0–34.0)
MCHC: 32.8 g/dL (ref 30.0–36.0)
MCV: 95.7 fL (ref 80.0–100.0)
Monocytes Absolute: 0.4 10*3/uL (ref 0.1–1.0)
Monocytes Relative: 9 %
Neutro Abs: 3 10*3/uL (ref 1.7–7.7)
Neutrophils Relative %: 67 %
Platelets: 190 10*3/uL (ref 150–400)
RBC: 2.1 MIL/uL — ABNORMAL LOW (ref 3.87–5.11)
RDW: 14.7 % (ref 11.5–15.5)
WBC: 4.5 10*3/uL (ref 4.0–10.5)
nRBC: 0 % (ref 0.0–0.2)

## 2022-08-23 LAB — PREPARE RBC (CROSSMATCH)

## 2022-08-23 LAB — PROTIME-INR
INR: 1.5 — ABNORMAL HIGH (ref 0.8–1.2)
Prothrombin Time: 18.4 seconds — ABNORMAL HIGH (ref 11.4–15.2)

## 2022-08-23 LAB — LIPASE, BLOOD: Lipase: 30 U/L (ref 11–51)

## 2022-08-23 MED ORDER — PANTOPRAZOLE SODIUM 40 MG IV SOLR
40.0000 mg | Freq: Two times a day (BID) | INTRAVENOUS | Status: DC
  Administered 2022-08-24 – 2022-08-27 (×7): 40 mg via INTRAVENOUS
  Filled 2022-08-23 (×7): qty 10

## 2022-08-23 MED ORDER — ACETAMINOPHEN 325 MG PO TABS
650.0000 mg | ORAL_TABLET | Freq: Four times a day (QID) | ORAL | Status: DC | PRN
  Administered 2022-08-25 – 2022-08-27 (×3): 650 mg via ORAL
  Filled 2022-08-23 (×3): qty 2

## 2022-08-23 MED ORDER — FENTANYL CITRATE PF 50 MCG/ML IJ SOSY
50.0000 ug | PREFILLED_SYRINGE | Freq: Once | INTRAMUSCULAR | Status: AC
  Administered 2022-08-23: 50 ug via INTRAVENOUS
  Filled 2022-08-23: qty 1

## 2022-08-23 MED ORDER — SODIUM CHLORIDE 0.9% IV SOLUTION: Freq: Once | INTRAVENOUS | Status: AC

## 2022-08-23 MED ORDER — ONDANSETRON HCL 4 MG/2ML IJ SOLN: 4.0000 mg | Freq: Four times a day (QID) | INTRAMUSCULAR | Status: DC | PRN

## 2022-08-23 MED ORDER — ONDANSETRON HCL 4 MG PO TABS: 4.0000 mg | ORAL_TABLET | Freq: Four times a day (QID) | ORAL | Status: DC | PRN

## 2022-08-23 MED ORDER — ACETAMINOPHEN 650 MG RE SUPP: 650.0000 mg | Freq: Four times a day (QID) | RECTAL | Status: DC | PRN

## 2022-08-23 MED ORDER — PANTOPRAZOLE SODIUM 40 MG IV SOLR
40.0000 mg | Freq: Once | INTRAVENOUS | Status: AC
  Administered 2022-08-23: 40 mg via INTRAVENOUS
  Filled 2022-08-23: qty 10

## 2022-08-23 NOTE — ED Provider Notes (Signed)
[facility] Provider Note  CSN: [id number] Arrival date & time: 08/23/22 1717  Chief Complaint(s) Weakness  HPI Tabitha Black is a 89 y.o. female with PMH CKD, T2DM, HTN, GERD, paroxysmal A-fib on Eliquis who presents emergency department for evaluation of generalized weakness and abnormal labs.  Patient states that over the last few weeks she has been getting progressively more and more fatigued and she was seen by her nephrologist recently and outpatient labs showed a decrease in her hemoglobin from 9.7 to approximately 7.3.  She states over the last 72 hours she has had jet black stool and this is very abnormal for her.  Last Eliquis dose at 8 AM this morning.  Denies chest pain, shortness of breath, headache, fever or other systemic symptoms.   Past Medical History Past Medical History:  Diagnosis Date   CKD (chronic kidney disease)    Diabetes mellitus without complication (HCC)    type II   GERD (gastroesophageal reflux disease)    High cholesterol    Hypertension    Osteoarthritis    Skin cancer, basal cell    Patient Active Problem List   Diagnosis Date Noted   Pain in joint of left knee 01/06/2021   Adverse effect of antihyperlipidemic and antiarteriosclerotic drugs, initial encounter 08/22/2020   Age-related physical debility 08/22/2020   Chronic congestive heart failure ([place]) 08/22/2020   Chronic kidney disease, stage 4 (severe) ([place]) 08/22/2020   Diabetic renal disease ([place]) 08/22/2020   Gastroesophageal reflux disease 08/22/2020   History of malignant neoplasm of skin 08/22/2020   Hypertensive heart and chronic kidney disease with heart failure and stage 1 through stage 4 chronic kidney disease, or unspecified chronic kidney disease ([place]) 08/22/2020   Iron deficiency anemia 08/22/2020   Mixed hyperlipidemia 08/22/2020   Morbid obesity ([place]) 08/22/2020   Osteoarthritis 08/22/2020   Personal history of colonic polyps  08/22/2020   Skin ulcer ([place]) 08/22/2020   Cervical spondylosis with radiculopathy 07/26/2017   Small bowel obstruction ([place]) 04/01/2015   Home Medication(s) Prior to Admission medications   Medication Sig Start Date End Date Taking? Authorizing Provider  apixaban (ELIQUIS) 2.5 MG TABS tablet Take 1 tablet (2.5 mg total) by mouth 2 (two) times daily. 06/08/22   Josue Hector, MD  Cholecalciferol (VITAMIN D3) 1.25 MG (50000 UT) TABS Take 1 tablet by mouth daily.    [provider]  cyclobenzaprine (FLEXERIL) 10 MG tablet Take 5 mg by mouth 3 (three) times daily as needed for muscle spasms.     [provider]  doxazosin (CARDURA) 4 MG tablet doxazosin 4 mg tablet  Take 1 tablet every day by oral route.    [provider]  fenofibrate 160 MG tablet Take 160 mg by mouth every evening.     [provider]  furosemide (LASIX) 80 MG tablet furosemide 80 mg tablet  1 (ONE) TABLET BY MOUTH TWO TIMES DAILY, TAKE AT 8AM AND 2PM    [provider]  gemfibrozil (LOPID) 600 MG tablet Take by mouth. 05/08/20   [provider]  hydrALAZINE (APRESOLINE) 50 MG tablet hydralazine 50 mg tablet  TAKE 1 TABLET BY MOUTH THREE TIMES A DAY    [provider]  HYDROcodone-acetaminophen (NORCO/VICODIN) 5-325 MG tablet Take 1 tablet by mouth daily.    [provider]  Iron-FA-B Cmp-C-Biot-Probiotic (FUSION PLUS) CAPS Take 1 capsule by mouth daily. 04/19/20   [provider]  metoprolol succinate (TOPROL-XL) 100 MG  24 hr tablet Take 100 mg by mouth every evening. Take with or immediately following a meal.    [provider]  metoprolol tartrate (LOPRESSOR) 100 MG tablet Lopressor 100 mg tablet  Take 1 tablet twice a day by oral route.    [provider]  Multiple Vitamins-Minerals (HAIR SKIN AND NAILS FORMULA) TABS 1 tablet    [provider]  mupirocin ointment (BACTROBAN) 2 % Apply to left 4th toe once daily.  04/19/22   Marzetta Board, DPM  NIFEdipine (PROCARDIA XL/NIFEDICAL XL) 60 MG 24 hr tablet Take 60 mg by mouth daily. 08/12/21   [provider]  sulfamethoxazole-trimethoprim (BACTRIM DS) 800-160 MG tablet Take 1 tablet by mouth 2 (two) times daily. 04/19/22   Marzetta Board, DPM  torsemide (DEMADEX) 20 MG tablet TAKE 1 TABLET (20 MG TOTAL) BY MOUTH 2 (TWO) TIMES DAILY. PLEASE ATTEND SCHEDULED APPOINTMENT FOR ADDITIONAL REFILLS. 06/15/22   Josue Hector, MD  traMADol (ULTRAM) 50 MG tablet Take 50 mg by mouth 2 (two) times daily as needed. 10/15/21   [provider]                                                                                                                                    Past Surgical History Past Surgical History:  Procedure Laterality Date   ABDOMINAL HYSTERECTOMY N/A    APPENDECTOMY N/A 1953   BACK SURGERY     CHOLECYSTECTOMY     COLONOSCOPY N/A 02/22/2008   TOTAL KNEE ARTHROPLASTY Right    UMBILICAL HERNIA REPAIR N/A 04/02/2015   Procedure: LAPAROSCOPIC UMBILICAL HERNIA REPAIR WITH VENTRALIGHT MESH;  Surgeon: Ralene Ok, MD;  Location: [facility];  Service: General;  Laterality: N/A;   Family History Family History  Problem Relation Age of Onset   Heart failure Mother    Hypertension Mother    Heart attack Father    Prostate cancer Father    Breast cancer Sister     Social History Social History   Tobacco Use   Smoking status: Never   Smokeless tobacco: Never  Substance Use Topics   Alcohol use: Yes    Comment: twice a year   Drug use: No   Allergies Other, Penicillins, Lovastatin, Azithromycin, Clindamycin, Hydralazine, Lisinopril, Neomycin sulfate [neomycin], Niacin, Rofecoxib, Tetracycline, Valsartan, Vytorin [ezetimibe-simvastatin], and Zetia [ezetimibe]  Review of Systems Review of Systems  Constitutional:  Positive for fatigue.  Cardiovascular:  Positive for leg swelling.  Gastrointestinal:  Positive for blood in  stool.    Physical Exam Vital Signs  I have reviewed the triage vital signs BP (!) 153/62   Pulse 85   Temp 97.9 F (36.6 C) (Oral)   Resp (!) 24   Ht '5\' 3"'$  (1.6 m)   Wt 91.6 kg   SpO2 99%   BMI 35.78 kg/m   Physical Exam Vitals and nursing note reviewed.  Constitutional:  General: She is not in acute distress.    Appearance: She is well-developed.  HENT:     Head: Normocephalic and atraumatic.  Eyes:     Conjunctiva/sclera: Conjunctivae normal.  Cardiovascular:     Rate and Rhythm: Normal rate and regular rhythm.     Heart sounds: No murmur heard. Pulmonary:     Effort: Pulmonary effort is normal. No respiratory distress.     Breath sounds: Normal breath sounds.  Abdominal:     Palpations: Abdomen is soft.     Tenderness: There is no abdominal tenderness.  Musculoskeletal:        General: No swelling.     Cervical back: Neck supple.     Right lower leg: Edema present.     Left lower leg: Edema present.  Skin:    General: Skin is warm and dry.     Capillary Refill: Capillary refill takes less than 2 seconds.  Neurological:     Mental Status: She is alert.  Psychiatric:        Mood and Affect: Mood normal.     ED Results and Treatments Labs (all labs ordered are listed, but only abnormal results are displayed) Labs Reviewed  COMPREHENSIVE METABOLIC PANEL - Abnormal; Notable for the following components:      Result Value   CO2 17 (*)    Glucose, Bld 121 (*)    BUN 58 (*)    Creatinine, Ser 2.59 (*)    Albumin 3.2 (*)    GFR, Estimated 17 (*)    Anion gap 17 (*)    All other components within normal limits  CBC WITH DIFFERENTIAL/PLATELET - Abnormal; Notable for the following components:   RBC 2.10 (*)    Hemoglobin 6.6 (*)    HCT 20.1 (*)    All other components within normal limits  PROTIME-INR - Abnormal; Notable for the following components:   Prothrombin Time 18.4 (*)    INR 1.5 (*)    All other components within normal limits  LIPASE,  BLOOD  TYPE AND SCREEN  PREPARE RBC (CROSSMATCH)                                                                                                                          Radiology No results found.  Pertinent labs & imaging results that were available during my care of the patient were reviewed by me and considered in my medical decision making (see MDM for details).  Medications Ordered in ED Medications  0.9 %  sodium chloride infusion (Manually program via Guardrails IV Fluids) (has no administration in time range)  pantoprazole (PROTONIX) injection 40 mg (has no administration in time range)  Procedures .Critical Care  Performed by: Teressa Lower, MD Authorized by: Teressa Lower, MD   Critical care provider statement:    Critical care time (minutes):  30   Critical care was necessary to treat or prevent imminent or life-threatening deterioration of the following conditions: Critical anemia requiring blood transfusion.   Critical care was time spent personally by me on the following activities:  Development of treatment plan with patient or surrogate, discussions with consultants, evaluation of patient's response to treatment, examination of patient, ordering and review of laboratory studies, ordering and review of radiographic studies, ordering and performing treatments and interventions, pulse oximetry, re-evaluation of patient's condition and review of old charts   (including critical care time)  Medical Decision Making / ED Course   This patient presents to the ED for concern of blood in stool, fatigue, this involves an extensive number of treatment options, and is a complaint that carries with it a high risk of complications and morbidity.  The differential diagnosis includes upper GI bleed, lower GI bleed, iron side effect, anemia of chronic  disease, acute blood loss anemia  MDM: Patient seen in the emergency department for evaluation of fatigue and abnormal labs.  Physical exam with significant lower extremity edema that the patient states has been going on for years but is certainly causing her pain.  Laboratory evaluation with a hemoglobin of 6.6 with an MCV of 95.7.  INR 1.5.  BUN 58, creatinine 2.59 which is near baseline for this patient.  I spoke with Dr. Paulita Fujita of [facility] who will see the patient tomorrow.  2 units packed red blood cells administered and pantoprazole initiated.  Patient then admitted to the hospitalist for suspected upper GI bleed.   Additional history obtained: -Additional history obtained from daughter -External records from outside source obtained and reviewed including: Chart review including previous notes, labs, imaging, consultation notes   Lab Tests: -I ordered, reviewed, and interpreted labs.   The pertinent results include:   Labs Reviewed  COMPREHENSIVE METABOLIC PANEL - Abnormal; Notable for the following components:      Result Value   CO2 17 (*)    Glucose, Bld 121 (*)    BUN 58 (*)    Creatinine, Ser 2.59 (*)    Albumin 3.2 (*)    GFR, Estimated 17 (*)    Anion gap 17 (*)    All other components within normal limits  CBC WITH DIFFERENTIAL/PLATELET - Abnormal; Notable for the following components:   RBC 2.10 (*)    Hemoglobin 6.6 (*)    HCT 20.1 (*)    All other components within normal limits  PROTIME-INR - Abnormal; Notable for the following components:   Prothrombin Time 18.4 (*)    INR 1.5 (*)    All other components within normal limits  LIPASE, BLOOD  TYPE AND SCREEN  PREPARE RBC (CROSSMATCH)      EKG   EKG Interpretation  Date/Time:  Monday August 23 2022 17:40:03 EST Ventricular Rate:  92 PR Interval:    QRS Duration: 90 QT Interval:  372 QTC Calculation: 460 R Axis:   73 Text Interpretation: Atrial fibrillation Left ventricular hypertrophy with  repolarization abnormality ( [place]olow-L[place] ) Abnormal ECG When compared with ECG of 24-Jan-2009 10:24, PREVIOUS ECG IS PRESENT Confirmed by Lacretia Leigh ([id number]) on 08/23/2022 6:02:19 PM         Medicines ordered and prescription drug management: Meds ordered this encounter  Medications   0.9 %  sodium chloride infusion (  Manually program via Guardrails IV Fluids)   pantoprazole (PROTONIX) injection 40 mg    -I have reviewed the patients home medicines and have made adjustments as needed  Critical interventions Blood administration  Consultations Obtained: I requested consultation with the gastroenterologist Dr. Paulita Fujita,  and discussed lab and imaging findings as well as pertinent plan - they recommend: Hospital admission   Cardiac Monitoring: The patient was maintained on a cardiac monitor.  I personally viewed and interpreted the cardiac monitored which showed an underlying rhythm of: A-fib  Social Determinants of Health:  Factors impacting patients care include: none   Reevaluation: After the interventions noted above, I reevaluated the patient and found that they have :improved  Co morbidities that complicate the patient evaluation  Past Medical History:  Diagnosis Date   CKD (chronic kidney disease)    Diabetes mellitus without complication (HCC)    type II   GERD (gastroesophageal reflux disease)    High cholesterol    Hypertension    Osteoarthritis    Skin cancer, basal cell       Dispostion: I considered admission for this patient, and due to suspected GI bleed requiring blood transfusion, patient require hospital admission     Final Clinical Impression(s) / ED Diagnoses Final diagnoses:  Gastrointestinal hemorrhage, unspecified gastrointestinal hemorrhage type  Anemia, unspecified type     '@PCDICTATION'$ @    Teressa Lower, MD 08/23/22 1955

## 2022-08-23 NOTE — Assessment & Plan Note (Signed)
Stop eliquis for the moment due to GIB Cont BB Rate controlled currently. Tele monitor

## 2022-08-23 NOTE — ED Triage Notes (Signed)
Pt c/o weakness and dizziness since Wednesday. Pt had labs done on 1/31 with hemoglobin noted to be 7.8. Endorses black stools for the past 2-3 days. Pt also endorses shortness of breath. On eliquis for hx of afib.

## 2022-08-23 NOTE — Assessment & Plan Note (Addendum)
GIB with melena over past 72h Clear liquid diet NPO after MN Protonix IV BID Stop eliquis EDP d/w Dr. Paulita Fujita, GI will see in consult tomorrow.

## 2022-08-23 NOTE — Assessment & Plan Note (Signed)
Chart dx, dont see that she's actually on any DM meds, BGL only 121 on BMP today in ED.

## 2022-08-23 NOTE — Assessment & Plan Note (Signed)
Creat today looks to be about baseline. Repeat BMP in AM.

## 2022-08-23 NOTE — Assessment & Plan Note (Signed)
1u PRBC transfusion for the moment Repeat CBC after transfusion 2nd u being held, but want to be careful of volume overload in pt with h/o CHF Does NOT appear to be in hemorrhagic shock or anything (SBP 150s in ED, no tachycardia).

## 2022-08-23 NOTE — ED Provider Triage Note (Signed)
Emergency Medicine Provider Triage Evaluation Note  Tabitha Black , a 89 y.o. female  was evaluated in triage.  Pt complains of 2 episodes of melena over the last 5 days.  She states that she had She believes that the last Wednesday as well as 1 at 2 PM today.  She has been on the anticoagulant, Eliquis, for the last 3 months after her cardiologist found her to be in A-fib.  She denies abdominal pain, back pain, fever, chills, chest pain, shortness of breath, urinary symptoms, nausea or vomiting, diarrhea, or other complaints at this time.  She does note that she had a recent hemoglobin level of 7.8 which prompted her to come to the ED today for further evaluation to make sure that she has stable blood counts.  Review of Systems  Positive: See HPI Negative: See HPI  Physical Exam  BP (!) 130/48   Pulse 93   Temp 98 F (36.7 C)   Resp 16   Ht '5\' 3"'$  (1.6 m)   Wt 91.6 kg   SpO2 100%   BMI 35.78 kg/m  Gen:   Awake, no distress   Resp:  Normal effort lungs clear to auscultation MSK:   Significant bilateral lower extremity chronic edematous changes, daughter states that this is at baseline per patient and tomorrow she is supposed to have call to set up home wound care visits Other:  Abdomen soft, nontender, no rebound or guarding  Medical Decision Making  Medically screening exam initiated at 5:59 PM.  Appropriate orders placed.  JOSCLYN ROSALES was informed that the remainder of the evaluation will be completed by another provider, this initial triage assessment does not replace that evaluation, and the importance of remaining in the ED until their evaluation is complete.     Suzzette Righter, PA-C 08/23/22 1801

## 2022-08-23 NOTE — ED Provider Notes (Incomplete)
[facility] Provider Note  CSN: [id number] Arrival date & time: 08/23/22 1717  Chief Complaint(s) Weakness  HPI Tabitha Black is a 89 y.o. female ***   Past Medical History Past Medical History:  Diagnosis Date   CKD (chronic kidney disease)    Diabetes mellitus without complication (HCC)    type II   GERD (gastroesophageal reflux disease)    High cholesterol    Hypertension    Osteoarthritis    Skin cancer, basal cell    Patient Active Problem List   Diagnosis Date Noted   Pain in joint of left knee 01/06/2021   Adverse effect of antihyperlipidemic and antiarteriosclerotic drugs, initial encounter 08/22/2020   Age-related physical debility 08/22/2020   Chronic congestive heart failure ([place]) 08/22/2020   Chronic kidney disease, stage 4 (severe) ([place]) 08/22/2020   Diabetic renal disease ([place]) 08/22/2020   Gastroesophageal reflux disease 08/22/2020   History of malignant neoplasm of skin 08/22/2020   Hypertensive heart and chronic kidney disease with heart failure and stage 1 through stage 4 chronic kidney disease, or unspecified chronic kidney disease ([place]) 08/22/2020   Iron deficiency anemia 08/22/2020   Mixed hyperlipidemia 08/22/2020   Morbid obesity ([place]) 08/22/2020   Osteoarthritis 08/22/2020   Personal history of colonic polyps 08/22/2020   Skin ulcer ([place]) 08/22/2020   Cervical spondylosis with radiculopathy 07/26/2017   Small bowel obstruction ([place]) 04/01/2015   Home Medication(s) Prior to Admission medications   Medication Sig Start Date End Date Taking? Authorizing Provider  apixaban (ELIQUIS) 2.5 MG TABS tablet Take 1 tablet (2.5 mg total) by mouth 2 (two) times daily. 06/08/22   Josue Hector, MD  Cholecalciferol (VITAMIN D3) 1.25 MG (50000 UT) TABS Take 1 tablet by mouth daily.    [provider]  cyclobenzaprine (FLEXERIL) 10 MG tablet Take 5 mg by mouth 3 (three) times daily as needed for muscle  spasms.     [provider]  doxazosin (CARDURA) 4 MG tablet doxazosin 4 mg tablet  Take 1 tablet every day by oral route.    [provider]  fenofibrate 160 MG tablet Take 160 mg by mouth every evening.     [provider]  furosemide (LASIX) 80 MG tablet furosemide 80 mg tablet  1 (ONE) TABLET BY MOUTH TWO TIMES DAILY, TAKE AT 8AM AND 2PM    [provider]  gemfibrozil (LOPID) 600 MG tablet Take by mouth. 05/08/20   [provider]  hydrALAZINE (APRESOLINE) 50 MG tablet hydralazine 50 mg tablet  TAKE 1 TABLET BY MOUTH THREE TIMES A DAY    [provider]  HYDROcodone-acetaminophen (NORCO/VICODIN) 5-325 MG tablet Take 1 tablet by mouth daily.    [provider]  Iron-FA-B Cmp-C-Biot-Probiotic (FUSION PLUS) CAPS Take 1 capsule by mouth daily. 04/19/20   [provider]  metoprolol succinate (TOPROL-XL) 100 MG 24 hr tablet Take 100 mg by mouth every evening. Take with or immediately following a meal.    [provider]  metoprolol tartrate (LOPRESSOR) 100 MG tablet Lopressor 100 mg tablet  Take 1 tablet twice a day by oral route.    [provider]  Multiple Vitamins-Minerals (HAIR SKIN AND NAILS FORMULA) TABS 1 tablet    [provider]  mupirocin ointment (BACTROBAN) 2 % Apply to left 4th toe once daily. 04/19/22   Marzetta Board, DPM  NIFEdipine (PROCARDIA XL/NIFEDICAL XL) 60 MG 24 hr tablet Take 60 mg by mouth daily. 08/12/21  [provider]  sulfamethoxazole-trimethoprim (BACTRIM DS) 800-160 MG tablet Take 1 tablet by mouth 2 (two) times daily. 04/19/22   Marzetta Board, DPM  torsemide (DEMADEX) 20 MG tablet TAKE 1 TABLET (20 MG TOTAL) BY MOUTH 2 (TWO) TIMES DAILY. PLEASE ATTEND SCHEDULED APPOINTMENT FOR ADDITIONAL REFILLS. 06/15/22   Josue Hector, MD  traMADol (ULTRAM) 50 MG tablet Take 50 mg by mouth 2 (two) times daily as needed. 10/15/21   [provider]                                                                                                                                     Past Surgical History Past Surgical History:  Procedure Laterality Date   ABDOMINAL HYSTERECTOMY N/A    APPENDECTOMY N/A 1953   BACK SURGERY     CHOLECYSTECTOMY     COLONOSCOPY N/A 02/22/2008   TOTAL KNEE ARTHROPLASTY Right    UMBILICAL HERNIA REPAIR N/A 04/02/2015   Procedure: LAPAROSCOPIC UMBILICAL HERNIA REPAIR WITH VENTRALIGHT MESH;  Surgeon: Ralene Ok, MD;  Location: [facility];  Service: General;  Laterality: N/A;   Family History Family History  Problem Relation Age of Onset   Heart failure Mother    Hypertension Mother    Heart attack Father    Prostate cancer Father    Breast cancer Sister     Social History Social History   Tobacco Use   Smoking status: Never   Smokeless tobacco: Never  Substance Use Topics   Alcohol use: Yes    Comment: twice a year   Drug use: No   Allergies Other, Penicillins, Lovastatin, Azithromycin, Clindamycin, Hydralazine, Lisinopril, Neomycin sulfate [neomycin], Niacin, Rofecoxib, Tetracycline, Valsartan, Vytorin [ezetimibe-simvastatin], and Zetia [ezetimibe]  Review of Systems Review of Systems *** Physical Exam Vital Signs  I have reviewed the triage vital signs BP (!) 137/44 (BP Location: Left Arm)   Pulse 83   Temp 97.6 F (36.4 C) (Oral)   Resp (!) 22   Ht '5\' 3"'$  (1.6 m)   Wt 91.6 kg   SpO2 99%   BMI 35.78 kg/m  *** Physical Exam  ED Results and Treatments Labs (all labs ordered are listed, but only abnormal results are displayed) Labs Reviewed  COMPREHENSIVE METABOLIC PANEL - Abnormal; Notable for the following components:      Result Value   CO2 17 (*)    Glucose, Bld 121 (*)    BUN 58 (*)    Creatinine, Ser 2.59 (*)    Albumin 3.2 (*)    GFR, Estimated 17 (*)    Anion gap 17 (*)    All other components within normal limits  CBC WITH DIFFERENTIAL/PLATELET - Abnormal; Notable for the  following components:   RBC 2.10 (*)    Hemoglobin 6.6 (*)    HCT 20.1 (*)    All other components within normal limits  PROTIME-INR - Abnormal; Notable for the following  components:   Prothrombin Time 18.4 (*)    INR 1.5 (*)    All other components within normal limits  LIPASE, BLOOD  TYPE AND SCREEN  PREPARE RBC (CROSSMATCH)                                                                                                                          Radiology No results found.  Pertinent labs & imaging results that were available during my care of the patient were reviewed by me and considered in my medical decision making (see MDM for details).  Medications Ordered in ED Medications  0.9 %  sodium chloride infusion (Manually program via Guardrails IV Fluids) (has no administration in time range)  pantoprazole (PROTONIX) injection 40 mg (has no administration in time range)                                                                                                                                     Procedures Procedures  (including critical care time)  Medical Decision Making / ED Course   This patient presents to the ED for concern of ***, this involves an extensive number of treatment options, and is a complaint that carries with it a high risk of complications and morbidity.  The differential diagnosis includes ***  MDM: ***   Additional history obtained: -Additional history obtained from *** -External records from outside source obtained and reviewed including: Chart review including previous notes, labs, imaging, consultation notes   Lab Tests: -I ordered, reviewed, and interpreted labs.   The pertinent results include:   Labs Reviewed  COMPREHENSIVE METABOLIC PANEL - Abnormal; Notable for the following components:      Result Value   CO2 17 (*)    Glucose, Bld 121 (*)    BUN 58 (*)    Creatinine, Ser 2.59 (*)    Albumin 3.2 (*)    GFR, Estimated 17 (*)     Anion gap 17 (*)    All other components within normal limits  CBC WITH DIFFERENTIAL/PLATELET - Abnormal; Notable for the following components:   RBC 2.10 (*)    Hemoglobin 6.6 (*)    HCT 20.1 (*)    All other components within normal limits  PROTIME-INR - Abnormal; Notable for the following components:   Prothrombin Time 18.4 (*)    INR 1.5 (*)    All other components  within normal limits  LIPASE, BLOOD  TYPE AND SCREEN  PREPARE RBC (CROSSMATCH)      EKG ***  EKG Interpretation  Date/Time:  Monday August 23 2022 17:40:03 EST Ventricular Rate:  92 PR Interval:    QRS Duration: 90 QT Interval:  372 QTC Calculation: 460 R Axis:   73 Text Interpretation: Atrial fibrillation Left ventricular hypertrophy with repolarization abnormality ( [place]olow-Lyon ) Abnormal ECG When compared with ECG of 24-Jan-2009 10:24, PREVIOUS ECG IS PRESENT Confirmed by Lacretia Leigh ([id number]) on 08/23/2022 6:02:19 PM         Imaging Studies ordered: I ordered imaging studies including *** I independently visualized and interpreted imaging. I agree with the radiologist interpretation   Medicines ordered and prescription drug management: Meds ordered this encounter  Medications   0.9 %  sodium chloride infusion (Manually program via Guardrails IV Fluids)   pantoprazole (PROTONIX) injection 40 mg    -I have reviewed the patients home medicines and have made adjustments as needed  Critical interventions ***  Consultations Obtained: I requested consultation with the ***,  and discussed lab and imaging findings as well as pertinent plan - they recommend: ***   Cardiac Monitoring: The patient was maintained on a cardiac monitor.  I personally viewed and interpreted the cardiac monitored which showed an underlying rhythm of: ***  Social Determinants of Health:  Factors impacting patients care include: ***   Reevaluation: After the interventions noted above, I reevaluated the patient and  found that they have :{resolved/improved/worsened:23923::"improved"}  Co morbidities that complicate the patient evaluation  Past Medical History:  Diagnosis Date   CKD (chronic kidney disease)    Diabetes mellitus without complication (HCC)    type II   GERD (gastroesophageal reflux disease)    High cholesterol    Hypertension    Osteoarthritis    Skin cancer, basal cell       Dispostion: I considered admission for this patient, ***     Final Clinical Impression(s) / ED Diagnoses Final diagnoses:  Gastrointestinal hemorrhage, unspecified gastrointestinal hemorrhage type  Anemia, unspecified type     '@PCDICTATION'$ @

## 2022-08-23 NOTE — H&P (Signed)
History and Physical    Patient: Tabitha Black DOB: 11/22/1932 DOA: 08/23/2022 DOS: the patient was seen and examined on 08/23/2022 PCP: [name], MD  Patient coming from: Home  Chief Complaint:  Chief Complaint  Patient presents with   Weakness   HPI: Tabitha Black is a 89 y.o. female with medical history significant of CKD 4, HTN, A.Fib on eliquis.  Pt with progressive generalized weakness over past few weeks.  HGB drop from 9.7 to 7.3 as of 1/31.  Then over past 72h having frank melena which is quite abnormal for her.  Last took eliquis at 8am this morning.  No CP, SOB, headache, fever. Review of Systems: As mentioned in the history of present illness. All other systems reviewed and are negative. Past Medical History:  Diagnosis Date   CKD (chronic kidney disease)    Diabetes mellitus without complication (HCC)    type II   GERD (gastroesophageal reflux disease)    High cholesterol    Hypertension    Osteoarthritis    Skin cancer, basal cell    Past Surgical History:  Procedure Laterality Date   ABDOMINAL HYSTERECTOMY N/A    APPENDECTOMY N/A 1953   BACK SURGERY     CHOLECYSTECTOMY     COLONOSCOPY N/A 02/22/2008   TOTAL KNEE ARTHROPLASTY Right    UMBILICAL HERNIA REPAIR N/A 04/02/2015   Procedure: LAPAROSCOPIC UMBILICAL HERNIA REPAIR WITH VENTRALIGHT MESH;  Surgeon: Ralene Ok, MD;  Location: [facility];  Service: General;  Laterality: N/A;   Social History:  reports that she has never smoked. She has never used smokeless tobacco. She reports current alcohol use. She reports that she does not use drugs.  Allergies  Allergen Reactions   Other Swelling    Topical mycin, unsure of which one, caused opthalmic swelling   Penicillins Itching, Other (See Comments), Rash, Shortness Of Breath and Swelling   Lovastatin Other (See Comments)    Weakness and myalgias, maybe to statins?   Azithromycin    Clindamycin    Hydralazine     Other reaction(s):  felt foggy   Lisinopril Other (See Comments)    Hyperkalemia    Neomycin Sulfate [Neomycin] Other (See Comments)    Redness and burning in face   Niacin Hives    Other reaction(s): hives   Rofecoxib Other (See Comments)   Tetracycline    Valsartan Other (See Comments)    Hyperkalemia    Vytorin [Ezetimibe-Simvastatin] Other (See Comments)    myalgias   Zetia [Ezetimibe] Other (See Comments)    Leg pain    Family History  Problem Relation Age of Onset   Heart failure Mother    Hypertension Mother    Heart attack Father    Prostate cancer Father    Breast cancer Sister     Prior to Admission medications   Medication Sig Start Date End Date Taking? Authorizing Provider  apixaban (ELIQUIS) 2.5 MG TABS tablet Take 1 tablet (2.5 mg total) by mouth 2 (two) times daily. 06/08/22   Josue Hector, MD  Cholecalciferol (VITAMIN D3) 1.25 MG (50000 UT) TABS Take 1 tablet by mouth daily.    [provider]  cyclobenzaprine (FLEXERIL) 10 MG tablet Take 5 mg by mouth 3 (three) times daily as needed for muscle spasms.     [provider]  doxazosin (CARDURA) 4 MG tablet doxazosin 4 mg tablet  Take 1 tablet every day by oral route.    [provider]  fenofibrate  160 MG tablet Take 160 mg by mouth every evening.     [provider]  furosemide (LASIX) 80 MG tablet furosemide 80 mg tablet  1 (ONE) TABLET BY MOUTH TWO TIMES DAILY, TAKE AT 8AM AND 2PM    [provider]  gemfibrozil (LOPID) 600 MG tablet Take by mouth. 05/08/20   [provider]  hydrALAZINE (APRESOLINE) 50 MG tablet hydralazine 50 mg tablet  TAKE 1 TABLET BY MOUTH THREE TIMES A DAY    [provider]  HYDROcodone-acetaminophen (NORCO/VICODIN) 5-325 MG tablet Take 1 tablet by mouth daily.    [provider]  Iron-FA-B Cmp-C-Biot-Probiotic (FUSION PLUS) CAPS Take 1 capsule by mouth daily. 04/19/20   [provider]  metoprolol succinate (TOPROL-XL)  100 MG 24 hr tablet Take 100 mg by mouth every evening. Take with or immediately following a meal.    [provider]  metoprolol tartrate (LOPRESSOR) 100 MG tablet Lopressor 100 mg tablet  Take 1 tablet twice a day by oral route.    [provider]  Multiple Vitamins-Minerals (HAIR SKIN AND NAILS FORMULA) TABS 1 tablet    [provider]  mupirocin ointment (BACTROBAN) 2 % Apply to left 4th toe once daily. 04/19/22   Marzetta Board, DPM  NIFEdipine (PROCARDIA XL/NIFEDICAL XL) 60 MG 24 hr tablet Take 60 mg by mouth daily. 08/12/21   [provider]  sulfamethoxazole-trimethoprim (BACTRIM DS) 800-160 MG tablet Take 1 tablet by mouth 2 (two) times daily. 04/19/22   Marzetta Board, DPM  torsemide (DEMADEX) 20 MG tablet TAKE 1 TABLET (20 MG TOTAL) BY MOUTH 2 (TWO) TIMES DAILY. PLEASE ATTEND SCHEDULED APPOINTMENT FOR ADDITIONAL REFILLS. 06/15/22   Josue Hector, MD  traMADol (ULTRAM) 50 MG tablet Take 50 mg by mouth 2 (two) times daily as needed. 10/15/21   [provider]    Physical Exam: Vitals:   08/23/22 1742 08/23/22 1835 08/23/22 1939 08/23/22 2000  BP: (!) 130/48 (!) 137/44 (!) 153/62 (!) 150/66  Pulse: 93 83 85 79  Resp: 16 (!) 22 (!) 24 19  Temp: 98 F (36.7 C) 97.6 F (36.4 C) 97.9 F (36.6 C) 97.9 F (36.6 C)  TempSrc:  Oral Oral Oral  SpO2: 100% 99% 99%   Weight:      Height:       Constitutional: NAD, calm, comfortable Respiratory: clear to auscultation bilaterally, no wheezing, no crackles. Normal respiratory effort. No accessory muscle use.  Cardiovascular: IRR, IRR. No extremity edema. 2+ pedal pulses. No carotid bruits.  Abdomen: no tenderness, no masses palpated. No hepatosplenomegaly. Bowel sounds positive.  Neurologic: CN 2-12 grossly intact. Sensation intact, DTR normal. Strength 5/5 in all 4.  Psychiatric: Normal judgment and insight. Alert and oriented x 3. Normal mood.   Data Reviewed:       Latest Ref Rng  & Units 08/23/2022    6:03 PM 06/08/2022    4:19 PM 04/01/2015    5:01 AM  CBC  WBC 4.0 - 10.5 K/uL 4.5  3.7  6.9   Hemoglobin 12.0 - 15.0 g/dL 6.6  C 9.7  13.0   Hematocrit 36.0 - 46.0 % 20.1  29.1  39.4   Platelets 150 - 400 K/uL 190  139  340     C Corrected result      Latest Ref Rng & Units 08/23/2022    6:03 PM 06/08/2022    4:19 PM 07/25/2020   11:26 AM  CMP  Glucose 70 - 99  mg/dL 121  103  111   BUN 8 - 23 mg/dL 58  44  52   Creatinine 0.44 - 1.00 mg/dL 2.59  2.32  2.31   Sodium 135 - 145 mmol/L 138  141  142   Potassium 3.5 - 5.1 mmol/L 3.8  4.1  4.7   Chloride 98 - 111 mmol/L 104  100  103   CO2 22 - 32 mmol/L '17  22  23   '$ Calcium 8.9 - 10.3 mg/dL 9.5  10.2  9.7   Total Protein 6.5 - 8.1 g/dL 6.5     Total Bilirubin 0.3 - 1.2 mg/dL 1.1     Alkaline Phos 38 - 126 U/L 69     AST 15 - 41 U/L 27     ALT 0 - 44 U/L 14       Assessment and Plan: * Gastrointestinal hemorrhage with melena GIB with melena over past 72h Clear liquid diet NPO after MN Protonix IV BID Stop eliquis EDP d/w Dr. Paulita Fujita, GI will see in consult tomorrow.  ABLA (acute blood loss anemia) 1u PRBC transfusion for the moment Repeat CBC after transfusion 2nd u being held, but want to be careful of volume overload in pt with h/o CHF Does NOT appear to be in hemorrhagic shock or anything (SBP 150s in ED, no tachycardia).  DM2 (diabetes mellitus, type 2) ([place]) Chart dx, dont see that she's actually on any DM meds, BGL only 121 on BMP today in ED.  A-fib ([place]) Stop eliquis for the moment due to GIB Cont BB Rate controlled currently. Tele monitor  HTN (hypertension) Cont BB, home diuretic.  Chronic kidney disease, stage 4 (severe) (HCC) Creat today looks to be about baseline. Repeat BMP in AM.  Chronic congestive heart failure ([place]) Not decompensated currently but high risk with PRBC transfusions. Cont home diuretics Reduce rate of blood transfusion Watch for signs / sx of volume  overload      Advance Care Planning:   Code Status: Full Code Confirmed with pt and MOST in chart  Consults: EDP d/w Dr. Paulita Fujita  Family Communication: No family in room  Severity of Illness: The appropriate patient status for this patient is INPATIENT. Inpatient status is judged to be reasonable and necessary in order to provide the required intensity of service to ensure the patient's safety. The patient's presenting symptoms, physical exam findings, and initial radiographic and laboratory data in the context of their chronic comorbidities is felt to place them at high risk for further clinical deterioration. Furthermore, it is not anticipated that the patient will be medically stable for discharge from the hospital within 2 midnights of admission.   * I certify that at the point of admission it is my clinical judgment that the patient will require inpatient hospital care spanning beyond 2 midnights from the point of admission due to high intensity of service, high risk for further deterioration and high frequency of surveillance required.*  Author: Etta Quill., DO 08/23/2022 9:39 PM  For on call review [email or web address].

## 2022-08-23 NOTE — Assessment & Plan Note (Signed)
Cont BB, home diuretic.

## 2022-08-23 NOTE — Assessment & Plan Note (Addendum)
Not decompensated currently but high risk with PRBC transfusions. Cont home diuretics Reduce rate of blood transfusion Watch for signs / sx of volume overload

## 2022-08-24 DIAGNOSIS — I4891 Unspecified atrial fibrillation: Secondary | ICD-10-CM | POA: Diagnosis not present

## 2022-08-24 DIAGNOSIS — N184 Chronic kidney disease, stage 4 (severe): Secondary | ICD-10-CM | POA: Diagnosis not present

## 2022-08-24 DIAGNOSIS — K921 Melena: Secondary | ICD-10-CM | POA: Diagnosis not present

## 2022-08-24 DIAGNOSIS — D62 Acute posthemorrhagic anemia: Secondary | ICD-10-CM | POA: Diagnosis not present

## 2022-08-24 LAB — BASIC METABOLIC PANEL
Anion gap: 14 (ref 5–15)
BUN: 52 mg/dL — ABNORMAL HIGH (ref 8–23)
CO2: 19 mmol/L — ABNORMAL LOW (ref 22–32)
Calcium: 9.6 mg/dL (ref 8.9–10.3)
Chloride: 108 mmol/L (ref 98–111)
Creatinine, Ser: 2.5 mg/dL — ABNORMAL HIGH (ref 0.44–1.00)
GFR, Estimated: 18 mL/min — ABNORMAL LOW (ref >60)
Glucose, Bld: 121 mg/dL — ABNORMAL HIGH (ref 70–99)
Potassium: 3.5 mmol/L (ref 3.5–5.1)
Sodium: 141 mmol/L (ref 135–145)

## 2022-08-24 LAB — CBC
HCT: 23 % — ABNORMAL LOW (ref 36.0–46.0)
HCT: 25.1 % — ABNORMAL LOW (ref 36.0–46.0)
HCT: 25.9 % — ABNORMAL LOW (ref 36.0–46.0)
Hemoglobin: 7.4 g/dL — ABNORMAL LOW (ref 12.0–15.0)
Hemoglobin: 8.4 g/dL — ABNORMAL LOW (ref 12.0–15.0)
Hemoglobin: 8.5 g/dL — ABNORMAL LOW (ref 12.0–15.0)
MCH: 31 pg (ref 26.0–34.0)
MCH: 31.5 pg (ref 26.0–34.0)
MCH: 30.7 pg (ref 26.0–34.0)
MCHC: 32.2 g/dL (ref 30.0–36.0)
MCHC: 33.5 g/dL (ref 30.0–36.0)
MCHC: 32.8 g/dL (ref 30.0–36.0)
MCV: 96.2 fL (ref 80.0–100.0)
MCV: 94 fL (ref 80.0–100.0)
MCV: 93.5 fL (ref 80.0–100.0)
Platelets: 180 10*3/uL (ref 150–400)
Platelets: 190 10*3/uL (ref 150–400)
Platelets: 212 10*3/uL (ref 150–400)
RBC: 2.39 MIL/uL — ABNORMAL LOW (ref 3.87–5.11)
RBC: 2.67 MIL/uL — ABNORMAL LOW (ref 3.87–5.11)
RBC: 2.77 MIL/uL — ABNORMAL LOW (ref 3.87–5.11)
RDW: 15.2 % (ref 11.5–15.5)
RDW: 15.5 % (ref 11.5–15.5)
RDW: 15.5 % (ref 11.5–15.5)
WBC: 4.9 10*3/uL (ref 4.0–10.5)
WBC: 6.1 10*3/uL (ref 4.0–10.5)
WBC: 6.8 10*3/uL (ref 4.0–10.5)
nRBC: 0 % (ref 0.0–0.2)
nRBC: 0 % (ref 0.0–0.2)
nRBC: 0 % (ref 0.0–0.2)

## 2022-08-24 LAB — HEMOGLOBIN A1C
Hgb A1c MFr Bld: 5.1 % (ref 4.8–5.6)
Mean Plasma Glucose: 99.67 mg/dL

## 2022-08-24 LAB — GLUCOSE, CAPILLARY
Glucose-Capillary: 120 mg/dL — ABNORMAL HIGH (ref 70–99)
Glucose-Capillary: 140 mg/dL — ABNORMAL HIGH (ref 70–99)
Glucose-Capillary: 126 mg/dL — ABNORMAL HIGH (ref 70–99)

## 2022-08-24 LAB — MRSA NEXT GEN BY PCR, NASAL: MRSA by PCR Next Gen: NOT DETECTED

## 2022-08-24 MED ORDER — INSULIN ASPART 100 UNIT/ML IJ SOLN: 0.0000 [IU] | Freq: Three times a day (TID) | INTRAMUSCULAR | Status: DC

## 2022-08-24 MED ORDER — FUROSEMIDE 40 MG PO TABS
80.0000 mg | ORAL_TABLET | Freq: Two times a day (BID) | ORAL | Status: DC
  Administered 2022-08-24: 80 mg via ORAL
  Filled 2022-08-24: qty 2

## 2022-08-24 MED ORDER — FUROSEMIDE 40 MG PO TABS: 80.0000 mg | ORAL_TABLET | Freq: Two times a day (BID) | ORAL | Status: DC

## 2022-08-24 MED ORDER — SODIUM CHLORIDE 0.9 % IV SOLN: INTRAVENOUS | Status: DC

## 2022-08-24 MED ORDER — IPRATROPIUM-ALBUTEROL 0.5-2.5 (3) MG/3ML IN SOLN
3.0000 mL | Freq: Four times a day (QID) | RESPIRATORY_TRACT | Status: DC | PRN
  Administered 2022-08-24: 3 mL via RESPIRATORY_TRACT
  Filled 2022-08-24: qty 3

## 2022-08-24 MED ORDER — HYDROCERIN EX CREA
TOPICAL_CREAM | Freq: Two times a day (BID) | CUTANEOUS | Status: DC
  Administered 2022-08-27: 1 via TOPICAL
  Filled 2022-08-24: qty 113

## 2022-08-24 MED ORDER — METOPROLOL SUCCINATE ER 100 MG PO TB24
100.0000 mg | ORAL_TABLET | Freq: Every evening | ORAL | Status: DC
  Administered 2022-08-24 – 2022-08-26 (×3): 100 mg via ORAL
  Filled 2022-08-24 (×3): qty 1

## 2022-08-24 MED ORDER — METOPROLOL SUCCINATE ER 100 MG PO TB24: 100.0000 mg | ORAL_TABLET | Freq: Every evening | ORAL | Status: DC

## 2022-08-24 MED ORDER — LABETALOL HCL 5 MG/ML IV SOLN
10.0000 mg | INTRAVENOUS | Status: DC | PRN
  Administered 2022-08-24 (×2): 10 mg via INTRAVENOUS
  Filled 2022-08-24 (×2): qty 4

## 2022-08-24 NOTE — H&P (View-Only) (Signed)
Referring Provider: Dr. Alcario Drought Primary Care Physician:  [name], MD Primary Gastroenterologist:  Dr. Oletta Lamas  Reason for Consultation:  Melena  HPI: Tabitha Black is a 89 y.o. female on Eliquis (last dose 08/23/22 AM) who has been having black stools for the past 3 days without associated abdominal pain, hematochezia, N/V, hematemesis, or dizziness. Been feeling very weak for the past few days. Hgb drop to 7.3 from 9.7 as outpt. In ER Hgb 6.6. Colon 02/2013 where a tubular adenoma was removed and showed small internal hemorrhoids. Denies NSAIDs. No known history of peptic ulcer disease.  Past Medical History:  Diagnosis Date   CKD (chronic kidney disease)    Diabetes mellitus without complication (HCC)    type II   GERD (gastroesophageal reflux disease)    High cholesterol    Hypertension    Osteoarthritis    Skin cancer, basal cell     Past Surgical History:  Procedure Laterality Date   ABDOMINAL HYSTERECTOMY N/A    APPENDECTOMY N/A 1953   BACK SURGERY     CHOLECYSTECTOMY     COLONOSCOPY N/A 02/22/2008   TOTAL KNEE ARTHROPLASTY Right    UMBILICAL HERNIA REPAIR N/A 04/02/2015   Procedure: LAPAROSCOPIC UMBILICAL HERNIA REPAIR WITH VENTRALIGHT MESH;  Surgeon: Ralene Ok, MD;  Location: [facility];  Service: General;  Laterality: N/A;    Prior to Admission medications   Medication Sig Start Date End Date Taking? Authorizing Provider  apixaban (ELIQUIS) 2.5 MG TABS tablet Take 1 tablet (2.5 mg total) by mouth 2 (two) times daily. 06/08/22  Yes Josue Hector, MD  Cholecalciferol (VITAMIN D3) 125 MCG (5000 UT) TABS Take 5,000 Units by mouth daily.   Yes [provider]  cyclobenzaprine (FLEXERIL) 10 MG tablet Take 5 mg by mouth daily as needed for muscle spasms.   Yes [provider]  doxazosin (CARDURA) 4 MG tablet Take 4 mg by mouth daily.   Yes [provider]  fenofibrate 160 MG tablet Take 160 mg by mouth every evening.    Yes [provider]  furosemide (LASIX) 80 MG tablet Take 80 mg by mouth 2 (two) times daily. AT [place]   Yes [provider]  gemfibrozil (LOPID) 600 MG tablet Take 600 mg by mouth daily. 05/08/20  Yes [provider]  HYDROcodone-acetaminophen (NORCO/VICODIN) 5-325 MG tablet Take 1 tablet by mouth at bedtime.   Yes [provider]  Iron-FA-B Cmp-C-Biot-Probiotic (FUSION PLUS) CAPS Take 1 capsule by mouth daily. 04/19/20  Yes [provider]  metoprolol succinate (TOPROL-XL) 100 MG 24 hr tablet Take 100 mg by mouth every evening. Take with or immediately following a meal.   Yes [provider]  Multiple Vitamins-Minerals (HAIR SKIN AND NAILS FORMULA) TABS Take 1 tablet by mouth daily.   Yes [provider]  NIFEdipine (PROCARDIA XL/NIFEDICAL XL) 60 MG 24 hr tablet Take 60 mg by mouth daily. 08/12/21  Yes [provider]  torsemide (DEMADEX) 20 MG tablet TAKE 1 TABLET (20 MG TOTAL) BY MOUTH 2 (TWO) TIMES DAILY. PLEASE ATTEND SCHEDULED APPOINTMENT FOR ADDITIONAL REFILLS. Patient taking differently: Take 10-20 mg by mouth 2 (two) times daily. Take 1 tablet in the morning and 1/2 tablet in the evening 06/15/22  Yes Josue Hector, MD    Scheduled Meds:  [START ON 08/25/2022] furosemide  80 mg Oral BID   insulin aspart  0-6 Units Subcutaneous TID WC   metoprolol succinate  100 mg Oral QPM  pantoprazole (PROTONIX) IV  40 mg Intravenous Q12H   Continuous Infusions: PRN Meds:.acetaminophen **OR** acetaminophen, ondansetron **OR** ondansetron (ZOFRAN) IV  Allergies as of 08/23/2022 - Review Complete 08/23/2022  Allergen Reaction Noted   Other Swelling 03/31/2015   Penicillins Itching, Other (See Comments), Rash, Shortness Of Breath, and Swelling 03/31/2015   Lovastatin Other (See Comments) 03/31/2015   Azithromycin  03/20/2021   Clindamycin  03/20/2021   Hydralazine  05/25/2021   Lisinopril Other (See Comments) 11/29/2016   Neomycin  sulfate [neomycin] Other (See Comments) 11/29/2016   Niacin Hives 11/29/2016   Rofecoxib Other (See Comments) 05/16/2020   Tetracycline  03/20/2021   Valsartan Other (See Comments) 11/29/2016   Vytorin [ezetimibe-simvastatin] Other (See Comments) 11/29/2016   Zetia [ezetimibe] Other (See Comments) 11/29/2016    Family History  Problem Relation Age of Onset   Heart failure Mother    Hypertension Mother    Heart attack Father    Prostate cancer Father    Breast cancer Sister     Social History   Socioeconomic History   Marital status: Married    Spouse name: Not on file   Number of children: Not on file   Years of education: Not on file   Highest education level: Not on file  Occupational History   Not on file  Tobacco Use   Smoking status: Never   Smokeless tobacco: Never  Substance and Sexual Activity   Alcohol use: Yes    Comment: twice a year   Drug use: No   Sexual activity: Not on file  Other Topics Concern   Not on file  Social History Narrative   Not on file   Social Determinants of Health   Financial Resource Strain: Not on file  Food Insecurity: No Food Insecurity (08/24/2022)   Hunger Vital Sign    Worried About Running Out of Food in the Last Year: Never true    Ran Out of Food in the Last Year: Never true  Transportation Needs: Not on file  Physical Activity: Not on file  Stress: Not on file  Social Connections: Not on file  Intimate Partner Violence: Not on file    Review of Systems: All negative except as stated above in HPI.  Physical Exam: Vital signs: Vitals:   08/24/22 0933 08/24/22 1049  BP: (!) 180/74 (!) 169/91  Pulse: 96 96  Resp: (!) 22 (!) 22  Temp: 98 F (36.7 C) (!) 97.3 F (36.3 C)  SpO2: 95% 96%   Last BM Date : 08/23/22 General:   Lethargic, elderly, well-nourished, no acute distress  Head: normocephalic, atraumatic Eyes: anicteric sclera ENT: oropharynx clear Neck: supple, nontender Lungs:  Clear throughout to  auscultation.   No wheezes, crackles, or rhonchi. No acute distress. Heart:  Regular rate and rhythm; no murmurs, clicks, rubs,  or gallops. Abdomen: soft, nontender, nondistended, +BS  Rectal:  Deferred Ext: no edema  GI:  Lab Results: Recent Labs    08/23/22 1803 08/24/22 0105 08/24/22 0741  WBC 4.5 4.9 6.1  HGB 6.6* 7.4* 8.4*  HCT 20.1* 23.0* 25.1*  PLT 190 180 190   BMET Recent Labs    08/23/22 1803  NA 138  K 3.8  CL 104  CO2 17*  GLUCOSE 121*  BUN 58*  CREATININE 2.59*  CALCIUM 9.5   LFT Recent Labs    08/23/22 1803  PROT 6.5  ALBUMIN 3.2*  AST 27  ALT 14  ALKPHOS 69  BILITOT 1.1   PT/INR Recent  Labs    08/23/22 1803  LABPROT 18.4*  INR 1.5*     Studies/Results: No results found.  Impression/Plan: GI bleed with melenic stools in the setting of Eliquis. Question peptic ulcer bleed. Hemodynamically stable. IV PPI Q 12. Hold Eliquis. EGD tomorrow morning. Clear liquid diet today and NPO p MN. If EGD unrevealing, then may need an updated colonoscopy and/or capsule endoscopy.    LOS: 1 day   Lear Ng  08/24/2022, 11:25 AM  Questions please call [phone]

## 2022-08-24 NOTE — Progress Notes (Addendum)
PROGRESS NOTE        PATIENT DETAILS Name: Tabitha Black Age: 89 y.o. Sex: female Date of Birth: 10-14-1932 Admit Date: 08/23/2022 Admitting Physician Etta Quill, DO ASN:[email or web address], Geryl Rankins, MD  Brief Summary: Patient is a 89 y.o.  female CKD 4, A-fib on Eliquis, HTN-who presented with melena and acute blood loss anemia.  Significant events: 2/5>> admit to [facility]-GI bleed with acute blood loss anemia.  Significant studies: None  Significant microbiology data: None  Procedures: None  Consults: GI  Subjective: Complains of some back pain but otherwise appears comfortable.  No melanotic stools this morning.  Objective: Vitals: Blood pressure (!) 169/91, pulse 96, temperature (!) 97.3 F (36.3 C), temperature source Oral, resp. rate (!) 22, height '5\' 3"'$  (1.6 m), weight 91.6 kg, SpO2 96 %.   Exam: Gen Exam:Alert awake-not in any distress HEENT:atraumatic, normocephalic Chest: B/L clear to auscultation anteriorly CVS:S1S2 irregular Abdomen:soft non tender, non distended Extremities:no edema Neurology: Non focal Skin: no rash  Pertinent Labs/Radiology:    Latest Ref Rng & Units 08/24/2022    7:41 AM 08/24/2022    1:05 AM 08/23/2022    6:03 PM  CBC  WBC 4.0 - 10.5 K/uL 6.1  4.9  4.5   Hemoglobin 12.0 - 15.0 g/dL 8.4  7.4  6.6  C  Hematocrit 36.0 - 46.0 % 25.1  23.0  20.1   Platelets 150 - 400 K/uL 190  180  190     C Corrected result    Lab Results  Component Value Date   NA 138 08/23/2022   K 3.8 08/23/2022   CL 104 08/23/2022   CO2 17 (L) 08/23/2022      Assessment/Plan: Upper GI bleed with acute blood loss anemia No further melanotic stools overnight Hb stable at 8.4-after 1 unit of PRBC transfusion Continue PPI Continue n.p.o. status-await GI input regarding endoscopy evaluation Follow CBC  Chronic atrial fibrillation Rate controlled Eliquis on hold Continue beta-blocker Telemetry monitoring  Chronic HFpEF Seems  stable-lymphedema at baseline Continue diuretics  HTN BP stable with beta-blocker  CKD stage IV At baseline Follow electrolytes periodically  DM-2 SSI Recent Labs    08/24/22 1053  GLUCAP 120*     B/L Lower ext Lymphedema Left leg wound Chronic issue Wound  care eval  Obesity: Estimated body mass index is 35.78 kg/m as calculated from the following:   Height as of this encounter: '5\' 3"'$  (1.6 m).   Weight as of this encounter: 91.6 kg.   Code status:   Code Status: Full Code   DVT Prophylaxis: SCDs Start: 08/23/22 2040   Family Communication: None at bedside   Disposition Plan: Status is: Inpatient Remains inpatient appropriate because: Severity of illness   Planned Discharge Destination:Home   Diet: Diet Order             Diet NPO time specified  Diet effective midnight           Diet clear liquid Room service appropriate? Yes; Fluid consistency: Thin  Diet effective now                     Antimicrobial agents: Anti-infectives (From admission, onward)    None        MEDICATIONS: Scheduled Meds:  [START ON 08/25/2022] furosemide  80 mg Oral BID   insulin aspart  0-6  Units Subcutaneous TID WC   metoprolol succinate  100 mg Oral QPM   pantoprazole (PROTONIX) IV  40 mg Intravenous Q12H   Continuous Infusions: PRN Meds:.acetaminophen **OR** acetaminophen, ondansetron **OR** ondansetron (ZOFRAN) IV   I have personally reviewed following labs and imaging studies  LABORATORY DATA: CBC: Recent Labs  Lab 08/23/22 1803 08/24/22 0105 08/24/22 0741  WBC 4.5 4.9 6.1  NEUTROABS 3.0  --   --   HGB 6.6* 7.4* 8.4*  HCT 20.1* 23.0* 25.1*  MCV 95.7 96.2 94.0  PLT 190 180 696    Basic Metabolic Panel: Recent Labs  Lab 08/23/22 1803  NA 138  K 3.8  CL 104  CO2 17*  GLUCOSE 121*  BUN 58*  CREATININE 2.59*  CALCIUM 9.5    GFR: Estimated Creatinine Clearance: 16.5 mL/min (A) (by C-G formula based on SCr of 2.59 mg/dL (H)).  Liver  Function Tests: Recent Labs  Lab 08/23/22 1803  AST 27  ALT 14  ALKPHOS 69  BILITOT 1.1  PROT 6.5  ALBUMIN 3.2*   Recent Labs  Lab 08/23/22 1803  LIPASE 30   No results for input(s): "AMMONIA" in the last 168 hours.  Coagulation Profile: Recent Labs  Lab 08/23/22 1803  INR 1.5*    Cardiac Enzymes: No results for input(s): "CKTOTAL", "CKMB", "CKMBINDEX", "TROPONINI" in the last 168 hours.  BNP (last 3 results) No results for input(s): "PROBNP" in the last 8760 hours.  Lipid Profile: No results for input(s): "CHOL", "HDL", "LDLCALC", "TRIG", "CHOLHDL", "LDLDIRECT" in the last 72 hours.  Thyroid Function Tests: No results for input(s): "TSH", "T4TOTAL", "FREET4", "T3FREE", "THYROIDAB" in the last 72 hours.  Anemia Panel: No results for input(s): "VITAMINB12", "FOLATE", "FERRITIN", "TIBC", "IRON", "RETICCTPCT" in the last 72 hours.  Urine analysis:    Component Value Date/Time   COLORURINE AMBER (A) 04/01/2015 0425   APPEARANCEUR CLOUDY (A) 04/01/2015 0425   LABSPEC 1.020 04/01/2015 0425   PHURINE 5.0 04/01/2015 0425   GLUCOSEU NEGATIVE 04/01/2015 0425   HGBUR NEGATIVE 04/01/2015 0425   BILIRUBINUR MODERATE (A) 04/01/2015 0425   KETONESUR 15 (A) 04/01/2015 0425   PROTEINUR 30 (A) 04/01/2015 0425   UROBILINOGEN 1.0 04/01/2015 0425   NITRITE NEGATIVE 04/01/2015 0425   LEUKOCYTESUR SMALL (A) 04/01/2015 0425    Sepsis Labs: Lactic Acid, Venous No results found for: "LATICACIDVEN"  MICROBIOLOGY: No results found for this or any previous visit (from the past 240 hour(s)).  RADIOLOGY STUDIES/RESULTS: No results found.   LOS: 1 day   Oren Binet, MD  [facility]    To contact the attending provider between 7A-7P or the covering provider during after hours 7P-7A, please log into the web site [email or web address] and access using universal [place] password for that web site. If you do not have the password, please call the hospital  operator.  08/24/2022, 12:33 PM

## 2022-08-24 NOTE — Progress Notes (Signed)
MD notified of work of breathing and increased BP. Orders received.

## 2022-08-24 NOTE — ED Notes (Signed)
ED TO INPATIENT HANDOFF REPORT  ED Nurse Name and Phone #: Darnelle Maffucci [phone]  S Name/Age/Gender Tabitha Black 89 y.o. female Room/Bed: 009C/009C  Code Status   Code Status: Full Code  Home/SNF/Other Home Patient oriented to: self, place, and time Is this baseline? Yes   Triage Complete: Triage complete  Chief Complaint Gastrointestinal hemorrhage with melena [K92.1]  Triage Note Pt c/o weakness and dizziness since Wednesday. Pt had labs done on 1/31 with hemoglobin noted to be 7.8. Endorses black stools for the past 2-3 days. Pt also endorses shortness of breath. On eliquis for hx of afib.    Allergies Allergies  Allergen Reactions   Other Swelling    Topical mycin, unsure of which one, caused opthalmic swelling  Erythromycin    Penicillins Itching, Other (See Comments), Rash, Shortness Of Breath and Swelling   Lovastatin Other (See Comments)    Weakness and myalgias, maybe to statins?   Azithromycin     Patient doesn't remember having a reaction to this.   Clindamycin     Patient doesn't remember having a reaction to this.    Hydralazine     Other reaction(s): felt foggy   Lisinopril Other (See Comments)    Hyperkalemia    Neomycin Sulfate [Neomycin] Other (See Comments)    Redness and burning in face   Niacin Hives    Facial swelling and redness   Rofecoxib Other (See Comments)    Unknown reaction   Tetracycline    Valsartan Other (See Comments)    Hyperkalemia    Vytorin [Ezetimibe-Simvastatin] Other (See Comments)    myalgias   Zetia [Ezetimibe] Other (See Comments)    Leg pain    Level of Care/Admitting Diagnosis ED Disposition     ED Disposition  Admit   Condition  --   [place]: [facility] [[id number]]  Level of Care: Progressive [102]  Admit to Progressive based on following criteria: GI, ENDOCRINE disease patients with GI bleeding, acute liver failure or pancreatitis, stable with diabetic ketoacidosis or  thyrotoxicosis (hypothyroid) state.  May admit patient to [facility] or [facility] if equivalent level of care is available:: No  Covid Evaluation: Asymptomatic - no recent exposure (last 10 days) testing not required  Diagnosis: Gastrointestinal hemorrhage with melena [[id number]]  Admitting Physician: Etta Quill [[id number]]  Attending Physician: Etta Quill [[id number]]  Certification:: I certify this patient will need inpatient services for at least 2 midnights  Estimated Length of Stay: 3          B Medical/Surgery History Past Medical History:  Diagnosis Date   CKD (chronic kidney disease)    Diabetes mellitus without complication ([place])    type II   GERD (gastroesophageal reflux disease)    High cholesterol    Hypertension    Osteoarthritis    Skin cancer, basal cell    Past Surgical History:  Procedure Laterality Date   ABDOMINAL HYSTERECTOMY N/A    APPENDECTOMY N/A 1953   BACK SURGERY     CHOLECYSTECTOMY     COLONOSCOPY N/A 02/22/2008   TOTAL KNEE ARTHROPLASTY Right    UMBILICAL HERNIA REPAIR N/A 04/02/2015   Procedure: LAPAROSCOPIC UMBILICAL HERNIA REPAIR WITH VENTRALIGHT MESH;  Surgeon: Ralene Ok, MD;  Location: [facility];  Service: General;  Laterality: N/A;     A IV Location/Drains/Wounds Patient Lines/Drains/Airways Status     Active Line/Drains/Airways     Name Placement date Placement time Site Days   Peripheral IV  08/23/22 20 G Anterior;Left;Proximal Forearm 08/23/22  1910  Forearm  1   Peripheral IV 08/23/22 22 G 1" Anterior;Right Forearm 08/23/22  1952  Forearm  1   Incision (Closed) 04/02/15 Abdomen Other (Comment) 04/02/15  1333  -- 2701   Incision (Closed) 82/99/37 Umbilicus Other (Comment) 04/02/15  1333  -- 2701            Intake/Output Last 24 hours  Intake/Output Summary (Last 24 hours) at 08/24/2022 0916 Last data filed at 08/24/2022 0804 Gross per 24 hour  Intake 420.33 ml  Output 700 ml  Net -279.67 ml    Labs/Imaging Results  for orders placed or performed during the hospital encounter of 08/23/22 (from the past 48 hour(s))  Comprehensive metabolic panel     Status: Abnormal   Collection Time: 08/23/22  6:03 PM  Result Value Ref Range   Sodium 138 135 - 145 mmol/L   Potassium 3.8 3.5 - 5.1 mmol/L   Chloride 104 98 - 111 mmol/L   CO2 17 (L) 22 - 32 mmol/L   Glucose, Bld 121 (H) 70 - 99 mg/dL    Comment: Glucose reference range applies only to samples taken after fasting for at least 8 hours.   BUN 58 (H) 8 - 23 mg/dL   Creatinine, Ser 2.59 (H) 0.44 - 1.00 mg/dL   Calcium 9.5 8.9 - 10.3 mg/dL   Total Protein 6.5 6.5 - 8.1 g/dL   Albumin 3.2 (L) 3.5 - 5.0 g/dL   AST 27 15 - 41 U/L   ALT 14 0 - 44 U/L   Alkaline Phosphatase 69 38 - 126 U/L   Total Bilirubin 1.1 0.3 - 1.2 mg/dL   GFR, Estimated 17 (L) >60 mL/min    Comment: (NOTE) Calculated using the CKD-EPI Creatinine Equation (2021)    Anion gap 17 (H) 5 - 15    Comment: Performed at [facility], [place] [address], [place], [place] [address]  CBC with Differential     Status: Abnormal   Collection Time: 08/23/22  6:03 PM  Result Value Ref Range   WBC 4.5 4.0 - 10.5 K/uL   RBC 2.10 (L) 3.87 - 5.11 MIL/uL   Hemoglobin 6.6 (LL) 12.0 - 15.0 g/dL    Comment: This critical result has verified and been called to Joan Flores by [facility] Christians on 02 05 2024 at 1838, and has been read back.  REPEATED TO VERIFY CORRECTED ON 02/05 AT 1841: PREVIOUSLY REPORTED AS 6.6 This critical result has verified and been called to Joan Flores by Red Christians on 02 05 2024 at 1838, and has been read back.     HCT 20.1 (L) 36.0 - 46.0 %   MCV 95.7 80.0 - 100.0 fL   MCH 31.4 26.0 - 34.0 pg   MCHC 32.8 30.0 - 36.0 g/dL   RDW 14.7 11.5 - 15.5 %   Platelets 190 150 - 400 K/uL   nRBC 0.0 0.0 - 0.2 %   Neutrophils Relative % 67 %   Neutro Abs 3.0 1.7 - 7.7 K/uL   Lymphocytes Relative 17 %   Lymphs Abs 0.7 0.7 - 4.0 K/uL   Monocytes Relative 9 %   Monocytes Absolute 0.4 0.1 -  1.0 K/uL   Eosinophils Relative 7 %   Eosinophils Absolute 0.3 0.0 - 0.5 K/uL   Basophils Relative 0 %   Basophils Absolute 0.0 0.0 - 0.1 K/uL   Immature Granulocytes 0 %   Abs Immature Granulocytes 0.02  0.00 - 0.07 K/uL    Comment: Performed at [facility], [place] [address], [place], [place] [address]  Protime-INR     Status: Abnormal   Collection Time: 08/23/22  6:03 PM  Result Value Ref Range   Prothrombin Time 18.4 (H) 11.4 - 15.2 seconds   INR 1.5 (H) 0.8 - 1.2    Comment: (NOTE) INR goal varies based on device and disease states. Performed at [facility], [place] [address] [place], [place] [address]   Type and screen [place]     Status: None (Preliminary result)   Collection Time: 08/23/22  6:03 PM  Result Value Ref Range   ABO/RH(D) O POS    Antibody Screen NEG    Sample Expiration 08/26/2022,2359    Unit Number [id number]    Blood Component Type RED CELLS,LR    Unit division 00    Status of Unit ISSUED    Transfusion Status OK TO TRANSFUSE    Crossmatch Result      Compatible Performed at [facility], [place] [address] [place], [place] [address]    Unit Number [id number]    Blood Component Type RED CELLS,LR    Unit division 00    Status of Unit ALLOCATED    Transfusion Status OK TO TRANSFUSE    Crossmatch Result Compatible   Lipase, blood     Status: None   Collection Time: 08/23/22  6:03 PM  Result Value Ref Range   Lipase 30 11 - 51 U/L    Comment: Performed at [facility], [place] [address], [place], [place] [address]  Prepare RBC (crossmatch)     Status: None   Collection Time: 08/23/22  6:03 PM  Result Value Ref Range   Order Confirmation      ORDER PROCESSED BY BLOOD BANK Performed at [facility], [place] [address], [place], [place] [address]   CBC     Status: Abnormal   Collection Time: 08/24/22  1:05 AM  Result Value Ref Range   WBC 4.9 4.0 - 10.5 K/uL   RBC 2.39 (L) 3.87 - 5.11 MIL/uL    Hemoglobin 7.4 (L) 12.0 - 15.0 g/dL   HCT 23.0 (L) 36.0 - 46.0 %   MCV 96.2 80.0 - 100.0 fL   MCH 31.0 26.0 - 34.0 pg   MCHC 32.2 30.0 - 36.0 g/dL   RDW 15.2 11.5 - 15.5 %   Platelets 180 150 - 400 K/uL   nRBC 0.0 0.0 - 0.2 %    Comment: Performed at [facility], [place] [address], [place], [place] [address]  CBC     Status: Abnormal   Collection Time: 08/24/22  7:41 AM  Result Value Ref Range   WBC 6.1 4.0 - 10.5 K/uL   RBC 2.67 (L) 3.87 - 5.11 MIL/uL   Hemoglobin 8.4 (L) 12.0 - 15.0 g/dL   HCT 25.1 (L) 36.0 - 46.0 %   MCV 94.0 80.0 - 100.0 fL   MCH 31.5 26.0 - 34.0 pg   MCHC 33.5 30.0 - 36.0 g/dL   RDW 15.5 11.5 - 15.5 %   Platelets 190 150 - 400 K/uL   nRBC 0.0 0.0 - 0.2 %    Comment: Performed at [facility], [place] [address], [place], [place] [address]   No results found.  Pending Labs [employer] (From admission, onward)     Start     Ordered  08/25/22 1245  Basic metabolic panel  Tomorrow morning,   R        08/24/22 0905   08/24/22 0905  Hemoglobin A1c  Once,   R       Comments: To assess prior glycemic control    08/24/22 0904   08/24/22 0900  CBC  5A & 5P,   R      08/24/22 0650   08/24/22 8099  Basic metabolic panel  Once,   STAT        08/24/22 0240            Vitals/Pain Today's Vitals   08/24/22 0130 08/24/22 0431 08/24/22 0500 08/24/22 0757  BP: (!) 151/67 (!) 162/63 125/76 (!) 154/70  Pulse: 90 79 83 92  Resp: 20 (!) 31 (!) 23 (!) 22  Temp:  97.7 F (36.5 C)  98.1 F (36.7 C)  TempSrc:  Oral  Oral  SpO2: 97% 95% 95% 96%  Weight:      Height:      PainSc:    10-Worst pain ever    Isolation Precautions No active isolations  Medications Medications  pantoprazole (PROTONIX) injection 40 mg (has no administration in time range)  acetaminophen (TYLENOL) tablet 650 mg (has no administration in time range)    Or  acetaminophen (TYLENOL) suppository 650 mg (has no administration in time range)  ondansetron (ZOFRAN) tablet 4  mg (has no administration in time range)    Or  ondansetron (ZOFRAN) injection 4 mg (has no administration in time range)  insulin aspart (novoLOG) injection 0-6 Units (has no administration in time range)  metoprolol succinate (TOPROL-XL) 24 hr tablet 100 mg (has no administration in time range)  furosemide (LASIX) tablet 80 mg (has no administration in time range)  0.9 %  sodium chloride infusion (Manually program via Guardrails IV Fluids) (0 mLs Intravenous Stopped 08/23/22 2321)  pantoprazole (PROTONIX) injection 40 mg (40 mg Intravenous Given 08/23/22 2002)  fentaNYL (SUBLIMAZE) injection 50 mcg (50 mcg Intravenous Given 08/23/22 1956)    Mobility walks        R Recommendations: See Admitting Provider Note  Report given to:   Additional Notes: has gotten one unit h/h is improving

## 2022-08-24 NOTE — Evaluation (Signed)
Physical Therapy Evaluation Patient Details Name: Tabitha Black MRN: [id number] DOB: 03-29-1933 Today's Date: 08/24/2022  History of Present Illness  89 y.o. female presents to [facility] hospital on 08/23/2022 with melena and acute blood loss anemia. PMH includes CKD, DMII, GERD, HLD, HTN, OA.  Clinical Impression  Pt presents to PT with deficits in functional mobility, gait, balance, strength, power, endurance. Pt is generally weak and requires physical assistance to transfer at this time, with posterior lean initially in standing and poor endurance when stepping to pivot from bed to recliner. Pt lives alone at baseline and does not appear to have much consistent physical assistance available as her sister recently completed treatment for cancer. Pt will benefit from frequent mobilization in an effort to improve strength and activity tolerance while reducing falls risk. PT recommends SNF placement at this time.     Recommendations for follow up therapy are one component of a multi-disciplinary discharge planning process, led by the attending physician.  Recommendations may be updated based on patient status, additional functional criteria and insurance authorization.  Follow Up Recommendations Skilled nursing-short term rehab (<3 hours/day) Can patient physically be transported by private vehicle: No    Assistance Recommended at Discharge Frequent or constant Supervision/Assistance  Patient can return home with the following  A lot of help with walking and/or transfers;A lot of help with bathing/dressing/bathroom;Assistance with cooking/housework;Assist for transportation;Help with stairs or ramp for entrance    Equipment Recommendations Wheelchair (measurements PT)  Recommendations for Other Services       Functional Status Assessment Patient has had a recent decline in their functional status and demonstrates the ability to make significant improvements in function in a reasonable and predictable  amount of time.     Precautions / Restrictions Precautions Precautions: Fall Precaution Comments: BLE edema/wounds Restrictions Weight Bearing Restrictions: No      Mobility  Bed Mobility Overal bed mobility: Needs Assistance Bed Mobility: Supine to Sit     Supine to sit: Mod assist, HOB elevated     General bed mobility comments: increased time, assist via bed pad to pivot hips    Transfers Overall transfer level: Needs assistance Equipment used: Rollator (4 wheels) Transfers: Sit to/from Stand, Bed to chair/wheelchair/BSC Sit to Stand: Min assist   Step pivot transfers: Mod assist       General transfer comment: tactile cues to facilitate rock and increased trunk flexion, physical assist to increase anterior lean. Pt with posterior lean upon standing, PT provides verbal cues for walker positioning and weight shift as well as physical assistance to resist posterior lean. Very slow steps with reduced foot clearance to pivot from bed to recliner. Pt fatigues quickly and PT turns chair to meet patient prior to sitting    Ambulation/Gait                  Stairs            Wheelchair Mobility    Modified Rankin (Stroke Patients Only)       Balance Overall balance assessment: Needs assistance Sitting-balance support: No upper extremity supported, Feet supported Sitting balance-Leahy Scale: Fair     Standing balance support: Bilateral upper extremity supported, Reliant on assistive device for balance Standing balance-Leahy Scale: Poor                               Pertinent Vitals/Pain Pain Assessment Pain Assessment: Faces Faces Pain Scale: Hurts even more  Pain Location: LLE Pain Descriptors / Indicators: Sore Pain Intervention(s): Monitored during session    Home Living Family/patient expects to be discharged to:: Private residence Living Arrangements: Alone Available Help at Discharge: Family;Available PRN/intermittently  (sister lives very close, daughter lives 2 hours away) Type of Home: House Home Access: Ramped entrance       Home Layout: One level Home Equipment: Toilet riser;Shower seat;Grab bars - toilet;Grab bars - tub/shower;Other (comment) (3 wheeled walker without seat)      Prior Function Prior Level of Function : Needs assist             Mobility Comments: ambulatory at a modI level for household distances ADLs Comments: required assistance for transportation, IADLs     Hand Dominance        Extremity/Trunk Assessment   Upper Extremity Assessment Upper Extremity Assessment: Generalized weakness    Lower Extremity Assessment Lower Extremity Assessment: Generalized weakness (BLE edema (pt and daughter report her legs appear smaller than baseline))    Cervical / Trunk Assessment Cervical / Trunk Assessment: Normal  Communication   Communication: HOH  Cognition Arousal/Alertness: Awake/alert Behavior During Therapy: WFL for tasks assessed/performed Overall Cognitive Status: Within Functional Limits for tasks assessed                                          General Comments General comments (skin integrity, edema, etc.): pt on 2L [place], sats in mid 90s when mobilizing. Pt does appear to demonstrate increased work of breathing at rest and when mobilizing despite stable sats.    Exercises     Assessment/Plan    PT Assessment Patient needs continued PT services  PT Problem List Decreased strength;Decreased activity tolerance;Decreased balance;Decreased mobility;Decreased knowledge of use of DME;Cardiopulmonary status limiting activity;Pain       PT Treatment Interventions DME instruction;Gait training;Functional mobility training;Therapeutic activities;Therapeutic exercise;Balance training;Neuromuscular re-education;Patient/family education    PT Goals (Current goals can be found in the Care Plan section)  Acute Rehab PT Goals Patient Stated Goal: to  regain strength, return to household ambulation PT Goal Formulation: With patient/family Time For Goal Achievement: 09/07/22 Potential to Achieve Goals: Fair Additional Goals Additional Goal #1: Pt will ascend/descend ramp with supervision and use of least restrictive assistive device to demonstrate the ability to enter/exit her home successfully    Frequency Min 3X/week (until SNF confirmed)     Co-evaluation               AM-PAC PT "6 Clicks" Mobility  Outcome Measure Help needed turning from your back to your side while in a flat bed without using bedrails?: A Lot Help needed moving from lying on your back to sitting on the side of a flat bed without using bedrails?: A Lot Help needed moving to and from a bed to a chair (including a wheelchair)?: A Lot Help needed standing up from a chair using your arms (e.g., wheelchair or bedside chair)?: A Little Help needed to walk in hospital room?: Total Help needed climbing 3-5 steps with a railing? : Total 6 Click Score: 11    End of Session Equipment Utilized During Treatment: Oxygen Activity Tolerance: Patient limited by fatigue Patient left: in chair;with call bell/phone within reach;with family/visitor present Nurse Communication: Mobility status;Need for lift equipment (STEDY) PT Visit Diagnosis: Other abnormalities of gait and mobility (R26.89);Muscle weakness (generalized) (M62.81)    Time: 1610-9604 PT  Time Calculation (min) (ACUTE ONLY): 40 min   Charges:   PT Evaluation $PT Eval Low Complexity: [place], PT, DPT Acute Rehabilitation Office [place] 08/24/2022, 1:48 PM

## 2022-08-24 NOTE — TOC Initial Note (Signed)
Transition of Care [facility]) - Initial/Assessment Note    Patient Details  Name: Tabitha Black MRN: [id number] Date of Birth: 03-24-1933  Transition of Care [facility]) CM/SW Contact:    Cyndi Bender, RN Phone Number: 08/24/2022, 12:55 PM  Clinical Narrative:                 Spoke to patient and daughter, Renea, at bedside regarding transition needs.  Patient lives alone and has shower chair, walker and lift chair.  Patient sleeps in her lift chair due to her LLE wound/lymphedema. Patient is unable to lift her legs into the bed. MD added wound care consult for the above.  Patient has been trying to get appointment at [facility] wound care. This RNCM left VM requesting an apt.  Sister transports patient to apts.  Patient has changed PCP and hasn't been able to get apt. This RNCM sent request to CMA to make apt with PCP.  TOC will continue to follow for needs.   Expected Discharge Plan: [place]     Patient Goals and CMS Choice Patient states their goals for this hospitalization and ongoing recovery are:: return home          Expected Discharge Plan and Services   Discharge Planning Services: CM Consult   Living arrangements for the past 2 months: Single Family Home                                      Prior Living Arrangements/Services Living arrangements for the past 2 months: Single Family Home Lives with:: Self Patient language and need for interpreter reviewed:: Yes Do you feel safe going back to the place where you live?: Yes      Need for Family Participation in Patient Care: Yes (Comment) Care giver support system in place?: Yes (comment) Current home services: DME (lift chair, walker, shower chair) Criminal Activity/Legal Involvement Pertinent to Current Situation/Hospitalization: No - Comment as needed  Activities of Daily Living Home Assistive Devices/Equipment: Walker (specify type) ADL Screening (condition at time of  admission) Patient's cognitive ability adequate to safely complete daily activities?: Yes Is the patient deaf or have difficulty hearing?: Yes Does the patient have difficulty seeing, even when wearing glasses/contacts?: No Does the patient have difficulty concentrating, remembering, or making decisions?: No Patient able to express need for assistance with ADLs?: Yes Does the patient have difficulty dressing or bathing?: No Independently performs ADLs?: Yes (appropriate for developmental age) Does the patient have difficulty walking or climbing stairs?: Yes Weakness of Legs: Both Weakness of Arms/Hands: None  Permission Sought/Granted                  Emotional Assessment Appearance:: Appears stated age Attitude/Demeanor/Rapport: Gracious Affect (typically observed): Accepting Orientation: : Oriented to Self, Oriented to Place, Oriented to  Time, Oriented to Situation Alcohol / Substance Use: Not Applicable Psych Involvement: No (comment)  Admission diagnosis:  Gastrointestinal hemorrhage with melena [K92.1] Gastrointestinal hemorrhage, unspecified gastrointestinal hemorrhage type [K92.2] Anemia, unspecified type [D64.9] Patient Active Problem List   Diagnosis Date Noted   Gastrointestinal hemorrhage with melena 08/23/2022   ABLA (acute blood loss anemia) 08/23/2022   HTN (hypertension) 08/23/2022   A-fib ([place]) 08/23/2022   DM2 (diabetes mellitus, type 2) ([place]) 08/23/2022   Pain in joint of left knee 01/06/2021   Adverse effect of antihyperlipidemic and antiarteriosclerotic drugs, initial encounter 08/22/2020  Age-related physical debility 08/22/2020   Chronic congestive heart failure ([place]) 08/22/2020   Chronic kidney disease, stage 4 (severe) ([place]) 08/22/2020   Diabetic renal disease ([place]) 08/22/2020   Gastroesophageal reflux disease 08/22/2020   History of malignant neoplasm of skin 08/22/2020   Hypertensive heart and chronic kidney disease with heart failure and  stage 1 through stage 4 chronic kidney disease, or unspecified chronic kidney disease ([place]) 08/22/2020   Iron deficiency anemia 08/22/2020   Mixed hyperlipidemia 08/22/2020   Morbid obesity ([place]) 08/22/2020   Osteoarthritis 08/22/2020   Personal history of colonic polyps 08/22/2020   Skin ulcer ([place]) 08/22/2020   Cervical spondylosis with radiculopathy 07/26/2017   Small bowel obstruction ([place]) 04/01/2015   PCP:  Collene Leyden, MD Pharmacy:   [id number] [id number]: [phone]: [phone]    Social Determinants of Health (SDOH) Social History: SDOH Screenings   Food Insecurity: No Food Insecurity (08/24/2022)  Housing: Low Risk  (08/24/2022)  Transportation Needs: No Transportation Needs (08/24/2022)  Utilities: Not At Risk (08/24/2022)  Tobacco Use: Low Risk  (08/23/2022)   SDOH Interventions:     Readmission Risk Interventions     No data to display

## 2022-08-24 NOTE — ED Notes (Signed)
Pt in bed, pt had small hard black bm, aprox size of lacrosse ball.  Cleaned pt, GI md at bedside.

## 2022-08-24 NOTE — Consult Note (Signed)
Referring Provider: Dr. Alcario Drought Primary Care Physician:  [name], MD Primary Gastroenterologist:  Dr. Oletta Lamas  Reason for Consultation:  Melena  HPI: Tabitha Black is a 89 y.o. female on Eliquis (last dose 08/23/22 AM) who has been having black stools for the past 3 days without associated abdominal pain, hematochezia, N/V, hematemesis, or dizziness. Been feeling very weak for the past few days. Hgb drop to 7.3 from 9.7 as outpt. In ER Hgb 6.6. Colon 02/2013 where a tubular adenoma was removed and showed small internal hemorrhoids. Denies NSAIDs. No known history of peptic ulcer disease.  Past Medical History:  Diagnosis Date   CKD (chronic kidney disease)    Diabetes mellitus without complication (HCC)    type II   GERD (gastroesophageal reflux disease)    High cholesterol    Hypertension    Osteoarthritis    Skin cancer, basal cell     Past Surgical History:  Procedure Laterality Date   ABDOMINAL HYSTERECTOMY N/A    APPENDECTOMY N/A 1953   BACK SURGERY     CHOLECYSTECTOMY     COLONOSCOPY N/A 02/22/2008   TOTAL KNEE ARTHROPLASTY Right    UMBILICAL HERNIA REPAIR N/A 04/02/2015   Procedure: LAPAROSCOPIC UMBILICAL HERNIA REPAIR WITH VENTRALIGHT MESH;  Surgeon: Ralene Ok, MD;  Location: [place];  Service: General;  Laterality: N/A;    Prior to Admission medications   Medication Sig Start Date End Date Taking? Authorizing Provider  apixaban (ELIQUIS) 2.5 MG TABS tablet Take 1 tablet (2.5 mg total) by mouth 2 (two) times daily. 06/08/22  Yes Josue Hector, MD  Cholecalciferol (VITAMIN D3) 125 MCG (5000 UT) TABS Take 5,000 Units by mouth daily.   Yes [provider]  cyclobenzaprine (FLEXERIL) 10 MG tablet Take 5 mg by mouth daily as needed for muscle spasms.   Yes [provider]  doxazosin (CARDURA) 4 MG tablet Take 4 mg by mouth daily.   Yes [provider]  fenofibrate 160 MG tablet Take 160 mg by mouth every evening.    Yes [provider]  furosemide (LASIX) 80 MG tablet Take 80 mg by mouth 2 (two) times daily. AT [place]   Yes [provider]  gemfibrozil (LOPID) 600 MG tablet Take 600 mg by mouth daily. 05/08/20  Yes [provider]  HYDROcodone-acetaminophen (NORCO/VICODIN) 5-325 MG tablet Take 1 tablet by mouth at bedtime.   Yes [provider]  Iron-FA-B Cmp-C-Biot-Probiotic (FUSION PLUS) CAPS Take 1 capsule by mouth daily. 04/19/20  Yes [provider]  metoprolol succinate (TOPROL-XL) 100 MG 24 hr tablet Take 100 mg by mouth every evening. Take with or immediately following a meal.   Yes [provider]  Multiple Vitamins-Minerals (HAIR SKIN AND NAILS FORMULA) TABS Take 1 tablet by mouth daily.   Yes [provider]  NIFEdipine (PROCARDIA XL/NIFEDICAL XL) 60 MG 24 hr tablet Take 60 mg by mouth daily. 08/12/21  Yes [provider]  torsemide (DEMADEX) 20 MG tablet TAKE 1 TABLET (20 MG TOTAL) BY MOUTH 2 (TWO) TIMES DAILY. PLEASE ATTEND SCHEDULED APPOINTMENT FOR ADDITIONAL REFILLS. Patient taking differently: Take 10-20 mg by mouth 2 (two) times daily. Take 1 tablet in the morning and 1/2 tablet in the evening 06/15/22  Yes Josue Hector, MD    Scheduled Meds:  [START ON 08/25/2022] furosemide  80 mg Oral BID   insulin aspart  0-6 Units Subcutaneous TID WC   metoprolol succinate  100 mg Oral QPM  pantoprazole (PROTONIX) IV  40 mg Intravenous Q12H   Continuous Infusions: PRN Meds:.acetaminophen **OR** acetaminophen, ondansetron **OR** ondansetron (ZOFRAN) IV  Allergies as of 08/23/2022 - Review Complete 08/23/2022  Allergen Reaction Noted   Other Swelling 03/31/2015   Penicillins Itching, Other (See Comments), Rash, Shortness Of Breath, and Swelling 03/31/2015   Lovastatin Other (See Comments) 03/31/2015   Azithromycin  03/20/2021   Clindamycin  03/20/2021   Hydralazine  05/25/2021   Lisinopril Other (See Comments) 11/29/2016   Neomycin  sulfate [neomycin] Other (See Comments) 11/29/2016   Niacin Hives 11/29/2016   Rofecoxib Other (See Comments) 05/16/2020   Tetracycline  03/20/2021   Valsartan Other (See Comments) 11/29/2016   Vytorin [ezetimibe-simvastatin] Other (See Comments) 11/29/2016   Zetia [ezetimibe] Other (See Comments) 11/29/2016    Family History  Problem Relation Age of Onset   Heart failure Mother    Hypertension Mother    Heart attack Father    Prostate cancer Father    Breast cancer Sister     Social History   Socioeconomic History   Marital status: Married    Spouse name: Not on file   Number of children: Not on file   Years of education: Not on file   Highest education level: Not on file  Occupational History   Not on file  Tobacco Use   Smoking status: Never   Smokeless tobacco: Never  Substance and Sexual Activity   Alcohol use: Yes    Comment: twice a year   Drug use: No   Sexual activity: Not on file  Other Topics Concern   Not on file  Social History Narrative   Not on file   Social Determinants of Health   Financial Resource Strain: Not on file  Food Insecurity: No Food Insecurity (08/24/2022)   Hunger Vital Sign    Worried About Running Out of Food in the Last Year: Never true    Ran Out of Food in the Last Year: Never true  Transportation Needs: Not on file  Physical Activity: Not on file  Stress: Not on file  Social Connections: Not on file  Intimate Partner Violence: Not on file    Review of Systems: All negative except as stated above in HPI.  Physical Exam: Vital signs: Vitals:   08/24/22 0933 08/24/22 1049  BP: (!) 180/74 (!) 169/91  Pulse: 96 96  Resp: (!) 22 (!) 22  Temp: 98 F (36.7 C) (!) 97.3 F (36.3 C)  SpO2: 95% 96%   Last BM Date : 08/23/22 General:   Lethargic, elderly, well-nourished, no acute distress  Head: normocephalic, atraumatic Eyes: anicteric sclera ENT: oropharynx clear Neck: supple, nontender Lungs:  Clear throughout to  auscultation.   No wheezes, crackles, or rhonchi. No acute distress. Heart:  Regular rate and rhythm; no murmurs, clicks, rubs,  or gallops. Abdomen: soft, nontender, nondistended, +BS  Rectal:  Deferred Ext: no edema  GI:  Lab Results: Recent Labs    08/23/22 1803 08/24/22 0105 08/24/22 0741  WBC 4.5 4.9 6.1  HGB 6.6* 7.4* 8.4*  HCT 20.1* 23.0* 25.1*  PLT 190 180 190   BMET Recent Labs    08/23/22 1803  NA 138  K 3.8  CL 104  CO2 17*  GLUCOSE 121*  BUN 58*  CREATININE 2.59*  CALCIUM 9.5   LFT Recent Labs    08/23/22 1803  PROT 6.5  ALBUMIN 3.2*  AST 27  ALT 14  ALKPHOS 69  BILITOT 1.1   PT/INR Recent  Labs    08/23/22 1803  LABPROT 18.4*  INR 1.5*     Studies/Results: No results found.  Impression/Plan: GI bleed with melenic stools in the setting of Eliquis. Question peptic ulcer bleed. Hemodynamically stable. IV PPI Q 12. Hold Eliquis. EGD tomorrow morning. Clear liquid diet today and NPO p MN. If EGD unrevealing, then may need an updated colonoscopy and/or capsule endoscopy.    LOS: 1 day   Lear Ng  08/24/2022, 11:25 AM  Questions please call [phone]

## 2022-08-24 NOTE — Consult Note (Signed)
[facility] Nurse Consult Note: Reason for Consult: wound L lower leg  Wound type: full thickness likely related to lymphedema  Pressure Injury:  NA  Measurement:1.3 cms x 2 cms x 0.1 cms  Wound bed: 50% red, 50% yellow fibrinous moist  Drainage (amount, consistency, odor) Minimal serous  Periwound: scaly skin with cobblestoned appearance   Dressing procedure/placement/frequency:  Clean bilateral legs with soap and water, apply Eucerin cream twice daily from above toes to knees being careful not to place cream in between toes.   Cleanse L lateral leg full thickness wound with NS, cut piece of Aquacel AG Kellie Simmering [id number]) to fit and place in wound bed  every other day, cover with foam dressing or Telfa non-adherent pad, wrap leg with Kerlix and Ace bandage from above the toes to just below knee.     Lymphedema is outside the scope of practice for the [facility]. We do not not have lymphedema services for manual massage and compression wraps inpatient.    No topical care needed for skin changes related to lymphedema. Patient does have an appointment for follow-up with [facility], after wound healed consider possible referral to [facility] for management of lymphedema.    POC discussed with patient, bedside nurse, and case management.    WOC will not follow patient at this time.  Re-consult for further needs.    Thank you,    Myrtie Leuthold MSN, RN-BC, [employer]

## 2022-08-25 ENCOUNTER — Encounter (HOSPITAL_COMMUNITY): Payer: Self-pay | Admitting: Internal Medicine

## 2022-08-25 ENCOUNTER — Inpatient Hospital Stay (HOSPITAL_COMMUNITY): Payer: Medicare Other | Admitting: Certified Registered Nurse Anesthetist

## 2022-08-25 ENCOUNTER — Encounter (HOSPITAL_COMMUNITY)
Admission: EM | Disposition: A | Discharge status: Skilled Nursing Facility | Payer: Self-pay | Source: Home / Self Care | Attending: Internal Medicine

## 2022-08-25 DIAGNOSIS — E1122 Type 2 diabetes mellitus with diabetic chronic kidney disease: Secondary | ICD-10-CM

## 2022-08-25 DIAGNOSIS — D631 Anemia in chronic kidney disease: Secondary | ICD-10-CM

## 2022-08-25 DIAGNOSIS — I130 Hypertensive heart and chronic kidney disease with heart failure and stage 1 through stage 4 chronic kidney disease, or unspecified chronic kidney disease: Secondary | ICD-10-CM

## 2022-08-25 DIAGNOSIS — K921 Melena: Secondary | ICD-10-CM | POA: Diagnosis not present

## 2022-08-25 DIAGNOSIS — D62 Acute posthemorrhagic anemia: Secondary | ICD-10-CM | POA: Diagnosis not present

## 2022-08-25 DIAGNOSIS — K2971 Gastritis, unspecified, with bleeding: Secondary | ICD-10-CM

## 2022-08-25 DIAGNOSIS — N184 Chronic kidney disease, stage 4 (severe): Secondary | ICD-10-CM | POA: Diagnosis not present

## 2022-08-25 DIAGNOSIS — N189 Chronic kidney disease, unspecified: Secondary | ICD-10-CM

## 2022-08-25 DIAGNOSIS — I509 Heart failure, unspecified: Secondary | ICD-10-CM

## 2022-08-25 DIAGNOSIS — I4891 Unspecified atrial fibrillation: Secondary | ICD-10-CM | POA: Diagnosis not present

## 2022-08-25 DIAGNOSIS — K254 Chronic or unspecified gastric ulcer with hemorrhage: Secondary | ICD-10-CM

## 2022-08-25 HISTORY — PX: BIOPSY: SHX5522

## 2022-08-25 HISTORY — PX: ESOPHAGOGASTRODUODENOSCOPY (EGD) WITH PROPOFOL: SHX5813

## 2022-08-25 LAB — CBC
HCT: 22.5 % — ABNORMAL LOW (ref 36.0–46.0)
HCT: 25.1 % — ABNORMAL LOW (ref 36.0–46.0)
Hemoglobin: 7.6 g/dL — ABNORMAL LOW (ref 12.0–15.0)
Hemoglobin: 8.3 g/dL — ABNORMAL LOW (ref 12.0–15.0)
MCH: 31.4 pg (ref 26.0–34.0)
MCH: 30.7 pg (ref 26.0–34.0)
MCHC: 33.8 g/dL (ref 30.0–36.0)
MCHC: 33.1 g/dL (ref 30.0–36.0)
MCV: 93 fL (ref 80.0–100.0)
MCV: 93 fL (ref 80.0–100.0)
Platelets: 198 10*3/uL (ref 150–400)
Platelets: 216 10*3/uL (ref 150–400)
RBC: 2.42 MIL/uL — ABNORMAL LOW (ref 3.87–5.11)
RBC: 2.7 MIL/uL — ABNORMAL LOW (ref 3.87–5.11)
RDW: 15.5 % (ref 11.5–15.5)
RDW: 15.6 % — ABNORMAL HIGH (ref 11.5–15.5)
WBC: 6.1 10*3/uL (ref 4.0–10.5)
WBC: 6 10*3/uL (ref 4.0–10.5)
nRBC: 0 % (ref 0.0–0.2)
nRBC: 0 % (ref 0.0–0.2)

## 2022-08-25 LAB — BASIC METABOLIC PANEL
Anion gap: 15 (ref 5–15)
BUN: 55 mg/dL — ABNORMAL HIGH (ref 8–23)
CO2: 20 mmol/L — ABNORMAL LOW (ref 22–32)
Calcium: 9.2 mg/dL (ref 8.9–10.3)
Chloride: 106 mmol/L (ref 98–111)
Creatinine, Ser: 2.61 mg/dL — ABNORMAL HIGH (ref 0.44–1.00)
GFR, Estimated: 17 mL/min — ABNORMAL LOW (ref >60)
Glucose, Bld: 119 mg/dL — ABNORMAL HIGH (ref 70–99)
Potassium: 3.3 mmol/L — ABNORMAL LOW (ref 3.5–5.1)
Sodium: 141 mmol/L (ref 135–145)

## 2022-08-25 LAB — GLUCOSE, CAPILLARY
Glucose-Capillary: 117 mg/dL — ABNORMAL HIGH (ref 70–99)
Glucose-Capillary: 116 mg/dL — ABNORMAL HIGH (ref 70–99)
Glucose-Capillary: 124 mg/dL — ABNORMAL HIGH (ref 70–99)
Glucose-Capillary: 128 mg/dL — ABNORMAL HIGH (ref 70–99)
Glucose-Capillary: 141 mg/dL — ABNORMAL HIGH (ref 70–99)

## 2022-08-25 SURGERY — ESOPHAGOGASTRODUODENOSCOPY (EGD) WITH PROPOFOL
Anesthesia: Monitor Anesthesia Care

## 2022-08-25 MED ORDER — SODIUM CHLORIDE 0.9 % IV SOLN
INTRAVENOUS | Status: AC | PRN
  Administered 2022-08-25: 500 mL via INTRAVENOUS

## 2022-08-25 MED ORDER — SODIUM CHLORIDE 0.9 % IV SOLN: INTRAVENOUS | Status: DC | PRN

## 2022-08-25 MED ORDER — PROPOFOL 10 MG/ML IV BOLUS
INTRAVENOUS | Status: DC | PRN
  Administered 2022-08-25: 10 mg via INTRAVENOUS

## 2022-08-25 MED ORDER — ORAL CARE MOUTH RINSE: 15.0000 mL | OROMUCOSAL | Status: DC | PRN

## 2022-08-25 MED ORDER — POTASSIUM CHLORIDE 20 MEQ PO PACK
40.0000 meq | PACK | Freq: Once | ORAL | Status: AC
  Administered 2022-08-25: 40 meq via ORAL
  Filled 2022-08-25: qty 2

## 2022-08-25 MED ORDER — DOXAZOSIN MESYLATE 4 MG PO TABS
4.0000 mg | ORAL_TABLET | Freq: Every day | ORAL | Status: DC
  Administered 2022-08-25 – 2022-08-27 (×3): 4 mg via ORAL
  Filled 2022-08-25 (×3): qty 1

## 2022-08-25 MED ORDER — LIDOCAINE 2% (20 MG/ML) 5 ML SYRINGE
INTRAMUSCULAR | Status: DC | PRN
  Administered 2022-08-25: 20 mg via INTRAVENOUS
  Administered 2022-08-25: 50 mg via INTRAVENOUS

## 2022-08-25 MED ORDER — FUROSEMIDE 40 MG PO TABS
80.0000 mg | ORAL_TABLET | Freq: Every day | ORAL | Status: DC
  Administered 2022-08-25 – 2022-08-27 (×3): 80 mg via ORAL
  Filled 2022-08-25 (×3): qty 2

## 2022-08-25 MED ORDER — PROPOFOL 500 MG/50ML IV EMUL
INTRAVENOUS | Status: DC | PRN
  Administered 2022-08-25: 80 ug/kg/min via INTRAVENOUS

## 2022-08-25 SURGICAL SUPPLY — 15 items

## 2022-08-25 NOTE — Progress Notes (Signed)
Mobility Specialist Progress Note:   08/25/22 1004  Mobility  Activity Off unit   Will follow-up as time allows.   Gareth Eagle Calah Gershman Mobility Specialist Please contact via [employer] or  Rehab Office at [phone]

## 2022-08-25 NOTE — Progress Notes (Signed)
PROGRESS NOTE        PATIENT DETAILS Name: Tabitha Black Age: 89 y.o. Sex: female Date of Birth: 1932-08-22 Admit Date: 08/23/2022 Admitting Physician Etta Quill, DO NOM:VEHMCN, Geryl Rankins, MD  Brief Summary: Patient is a 89 y.o.  female CKD 4, A-fib on Eliquis, HTN-who presented with melena and acute blood loss anemia.  Significant events: 2/5>> admit to [facility]-GI bleed with acute blood loss anemia.  Significant studies: None  Significant microbiology data: None  Procedures: None  Consults: GI  Subjective: No chest pain or SOB. Claims has had 2 melanotic stools since yesterday morning  Objective: Vitals: Blood pressure (!) 167/65, pulse 89, temperature 98.2 F (36.8 C), temperature source Oral, resp. rate 17, height '5\' 3"'$  (1.6 m), weight 91.6 kg, SpO2 94 %.   Exam: Gen Exam:Alert awake-not in any distress HEENT:atraumatic, normocephalic Chest: B/L clear to auscultation anteriorly CVS:S1S2 regular Abdomen:soft non tender, non distended Extremities:chronic lymphedema Neurology: Non focal Skin: no rash  Pertinent Labs/Radiology:    Latest Ref Rng & Units 08/25/2022    5:37 AM 08/24/2022    5:06 PM 08/24/2022    7:41 AM  CBC  WBC 4.0 - 10.5 K/uL 6.1  6.8  6.1   Hemoglobin 12.0 - 15.0 g/dL 7.6  8.5  8.4   Hematocrit 36.0 - 46.0 % 22.5  25.9  25.1   Platelets 150 - 400 K/uL 198  212  190     Lab Results  Component Value Date   NA 141 08/25/2022   K 3.3 (L) 08/25/2022   CL 106 08/25/2022   CO2 20 (L) 08/25/2022      Assessment/Plan: Upper GI bleed with acute blood loss anemia 2 melanotic stools since yesterday-Hb down to 7.6 Unclear whether this is ongoing GI bleed or old blood making its way out EGD later today Check CBC at 11 am Will transfuse PRBC if significant drop in Hb Continue PPI Await further recs from GI team  Chronic atrial fibrillation Rate controlled Eliquis on hold Continue beta-blocker Telemetry  monitoring  Chronic HFpEF Seems stable-lymphedema at baseline Continue diuretics  HTN BP remains on the higher side Will need to allow some permissive HTN-given GI bleed Continue beta blocker Add cardura Follow/optimize  CKD stage IV At baseline Follow electrolytes periodically  DM-2 SSI Recent Labs    08/24/22 1623 08/24/22 2156 08/25/22 0648  GLUCAP 140* 126* 117*      B/L Lower ext Lymphedema Left leg wound-present prior to admit Chronic issue Appreciate wound care eval  Obesity: Estimated body mass index is 35.78 kg/m as calculated from the following:   Height as of this encounter: '5\' 3"'$  (1.6 m).   Weight as of this encounter: 91.6 kg.   Code status:   Code Status: Full Code   DVT Prophylaxis: SCDs Start: 08/23/22 2040   Family Communication: None at bedside   Disposition Plan: Status is: Inpatient Remains inpatient appropriate because: Severity of illness   Planned Discharge Destination:Home   Diet: Diet Order             Diet NPO time specified  Diet effective midnight                     Antimicrobial agents: Anti-infectives (From admission, onward)    None        MEDICATIONS: Scheduled Meds:  doxazosin  4 mg Oral Daily   furosemide  80 mg Oral Daily   hydrocerin   Topical BID   insulin aspart  0-6 Units Subcutaneous TID WC   metoprolol succinate  100 mg Oral QPM   pantoprazole (PROTONIX) IV  40 mg Intravenous Q12H   potassium chloride  40 mEq Oral Once   Continuous Infusions:  sodium chloride 20 mL/hr at 08/24/22 2234   PRN Meds:.acetaminophen **OR** acetaminophen, ipratropium-albuterol, labetalol, ondansetron **OR** ondansetron (ZOFRAN) IV, mouth rinse   I have personally reviewed following labs and imaging studies  LABORATORY DATA: CBC: Recent Labs  Lab 08/23/22 1803 08/24/22 0105 08/24/22 0741 08/24/22 1706 08/25/22 0537  WBC 4.5 4.9 6.1 6.8 6.1  NEUTROABS 3.0  --   --   --   --   HGB 6.6* 7.4* 8.4*  8.5* 7.6*  HCT 20.1* 23.0* 25.1* 25.9* 22.5*  MCV 95.7 96.2 94.0 93.5 93.0  PLT 190 180 190 212 198     Basic Metabolic Panel: Recent Labs  Lab 08/23/22 1803 08/24/22 1706 08/25/22 0537  NA 138 141 141  K 3.8 3.5 3.3*  CL 104 108 106  CO2 17* 19* 20*  GLUCOSE 121* 121* 119*  BUN 58* 52* 55*  CREATININE 2.59* 2.50* 2.61*  CALCIUM 9.5 9.6 9.2     GFR: Estimated Creatinine Clearance: 16.3 mL/min (A) (by C-G formula based on SCr of 2.61 mg/dL (H)).  Liver Function Tests: Recent Labs  Lab 08/23/22 1803  AST 27  ALT 14  ALKPHOS 69  BILITOT 1.1  PROT 6.5  ALBUMIN 3.2*    Recent Labs  Lab 08/23/22 1803  LIPASE 30    No results for input(s): "AMMONIA" in the last 168 hours.  Coagulation Profile: Recent Labs  Lab 08/23/22 1803  INR 1.5*     Cardiac Enzymes: No results for input(s): "CKTOTAL", "CKMB", "CKMBINDEX", "TROPONINI" in the last 168 hours.  BNP (last 3 results) No results for input(s): "PROBNP" in the last 8760 hours.  Lipid Profile: No results for input(s): "CHOL", "HDL", "LDLCALC", "TRIG", "CHOLHDL", "LDLDIRECT" in the last 72 hours.  Thyroid Function Tests: No results for input(s): "TSH", "T4TOTAL", "FREET4", "T3FREE", "THYROIDAB" in the last 72 hours.  Anemia Panel: No results for input(s): "VITAMINB12", "FOLATE", "FERRITIN", "TIBC", "IRON", "RETICCTPCT" in the last 72 hours.  Urine analysis:    Component Value Date/Time   COLORURINE AMBER (A) 04/01/2015 0425   APPEARANCEUR CLOUDY (A) 04/01/2015 0425   LABSPEC 1.020 04/01/2015 0425   PHURINE 5.0 04/01/2015 0425   GLUCOSEU NEGATIVE 04/01/2015 0425   HGBUR NEGATIVE 04/01/2015 0425   BILIRUBINUR MODERATE (A) 04/01/2015 0425   KETONESUR 15 (A) 04/01/2015 0425   PROTEINUR 30 (A) 04/01/2015 0425   UROBILINOGEN 1.0 04/01/2015 0425   NITRITE NEGATIVE 04/01/2015 0425   LEUKOCYTESUR SMALL (A) 04/01/2015 0425    Sepsis Labs: Lactic Acid, Venous No results found for:  "LATICACIDVEN"  MICROBIOLOGY: Recent Results (from the past 240 hour(s))  MRSA Next Gen by PCR, Nasal     Status: None   Collection Time: 08/24/22 11:19 AM   Specimen: Nasal Mucosa; Nasal Swab  Result Value Ref Range Status   MRSA by PCR Next Gen NOT DETECTED NOT DETECTED Final    Comment: (NOTE) The GeneXpert MRSA Assay (FDA approved for NASAL specimens only), is one component of a comprehensive MRSA colonization surveillance program. It is not intended to diagnose MRSA infection nor to guide or monitor treatment for MRSA infections. Test performance is not FDA approved in patients  less than 13 years old. Performed at [facility], [place] [address], [place], [place] [address]     RADIOLOGY STUDIES/RESULTS: No results found.   LOS: 2 days   Oren Binet, MD  [facility]    To contact the attending provider between 7A-7P or the covering provider during after hours 7P-7A, please log into the web site [email or web address] and access using universal [place] password for that web site. If you do not have the password, please call the hospital operator.  08/25/2022, 7:25 AM

## 2022-08-25 NOTE — NC FL2 (Signed)
[place] LEVEL OF CARE FORM     IDENTIFICATION  Patient Name: Tabitha Black Birthdate: Nov 27, 1932 Sex: female Admission Date (Current Location): 08/23/2022  [facility] and [place] Number:  Herbalist and Address:  [facility], [place] [address] [place],  [address]      Provider Number: [id number]  Attending Physician Name and Address:  Jonetta Osgood, MD  Relative Name and Phone Number:       Current Level of Care: Hospital Recommended Level of Care: [place] Prior Approval Number:    Date Approved/Denied:   PASRR Number: [id number]  Discharge Plan: SNF    Current Diagnoses: Patient Active Problem List   Diagnosis Date Noted   Gastrointestinal hemorrhage with melena 08/23/2022   ABLA (acute blood loss anemia) 08/23/2022   HTN (hypertension) 08/23/2022   A-fib ([place]) 08/23/2022   DM2 (diabetes mellitus, type 2) ([place]) 08/23/2022   Pain in joint of left knee 01/06/2021   Adverse effect of antihyperlipidemic and antiarteriosclerotic drugs, initial encounter 08/22/2020   Age-related physical debility 08/22/2020   Chronic congestive heart failure ([place]) 08/22/2020   Chronic kidney disease, stage 4 (severe) ([place]) 08/22/2020   Diabetic renal disease ([place]) 08/22/2020   Gastroesophageal reflux disease 08/22/2020   History of malignant neoplasm of skin 08/22/2020   Hypertensive heart and chronic kidney disease with heart failure and stage 1 through stage 4 chronic kidney disease, or unspecified chronic kidney disease ([place]) 08/22/2020   Iron deficiency anemia 08/22/2020   Mixed hyperlipidemia 08/22/2020   Morbid obesity ([place]) 08/22/2020   Osteoarthritis 08/22/2020   Personal history of colonic polyps 08/22/2020   Skin ulcer ([place]) 08/22/2020   Cervical spondylosis with radiculopathy 07/26/2017   Small bowel obstruction ([place]) 04/01/2015    Orientation RESPIRATION BLADDER Height & Weight     Time, Self,  Situation, Place  O2 (2 liters) Incontinent, External catheter Weight: 202 lb (91.6 kg) Height:  '5\' 3"'$  (160 cm)  BEHAVIORAL SYMPTOMS/MOOD NEUROLOGICAL BOWEL NUTRITION STATUS      Continent Diet (See dc summary)  AMBULATORY STATUS COMMUNICATION OF NEEDS Skin   Limited Assist Verbally Normal                       Personal Care Assistance Level of Assistance  Bathing, Dressing, Feeding Bathing Assistance: Maximum assistance Feeding assistance: Limited assistance Dressing Assistance: Maximum assistance     Functional Limitations Info  Sight, Hearing, Speech Sight Info: Impaired Hearing Info: Adequate Speech Info: Adequate    SPECIAL CARE FACTORS FREQUENCY  PT (By licensed PT), OT (By licensed OT)     PT Frequency: 5xweek OT Frequency: 5xweek            Contractures Contractures Info: Not present    Additional Factors Info  Code Status, Allergies Code Status Info: Full Allergies Info: Other  Penicillins  Lovastatin  Azithromycin  Clindamycin  Hydralazine  Lisinopril  Neomycin Sulfate (Neomycin)  Niacin  Rofecoxib  Tetracycline  Valsartan  Vytorin (Ezetimibe-simvastatin)  Zetia (Ezetimibe)           Current Medications (08/25/2022):  This is the current hospital active medication list Current Facility-Administered Medications  Medication Dose Route Frequency Provider Last Rate Last Admin   acetaminophen (TYLENOL) tablet 650 mg  650 mg Oral Q6H PRN Wilford Corner, MD       Or   acetaminophen (TYLENOL) suppository 650 mg  650 mg Rectal Q6H PRN Wilford Corner, MD  doxazosin (CARDURA) tablet 4 mg  4 mg Oral Daily Wilford Corner, MD       furosemide (LASIX) tablet 80 mg  80 mg Oral Daily Wilford Corner, MD       hydrocerin (EUCERIN) cream   Topical BID Wilford Corner, MD   Given at 08/24/22 2129   insulin aspart (novoLOG) injection 0-6 Units  0-6 Units Subcutaneous TID WC Wilford Corner, MD       ipratropium-albuterol (DUONEB) 0.5-2.5 (3) MG/3ML  nebulizer solution 3 mL  3 mL Nebulization Q6H PRN Wilford Corner, MD   3 mL at 08/24/22 1550   labetalol (NORMODYNE) injection 10 mg  10 mg Intravenous Q2H PRN Wilford Corner, MD   10 mg at 08/24/22 2126   metoprolol succinate (TOPROL-XL) 24 hr tablet 100 mg  100 mg Oral QPM Wilford Corner, MD   100 mg at 08/24/22 1502   ondansetron (ZOFRAN) tablet 4 mg  4 mg Oral Q6H PRN Wilford Corner, MD       Or   ondansetron [facility]) injection 4 mg  4 mg Intravenous Q6H PRN Wilford Corner, MD       Oral care mouth rinse  15 mL Mouth Rinse PRN Wilford Corner, MD       pantoprazole (PROTONIX) injection 40 mg  40 mg Intravenous Q12H Wilford Corner, MD   40 mg at 08/24/22 2126   potassium chloride (KLOR-CON) packet 40 mEq  40 mEq Oral Once Wilford Corner, MD         Discharge Medications: Please see discharge summary for a list of discharge medications.  Relevant Imaging Results:  Relevant Lab Results:   Additional Information SSN: [id number]  Beckey Rutter, MSW, [place] Transitions of Care  Clinical Social Worker I

## 2022-08-25 NOTE — TOC Initial Note (Signed)
Transition of Care [facility]) - Initial/Assessment Note    Patient Details  Name: Tabitha Black MRN: [id number] Date of Birth: [birth year]  Transition of [facility]) CM/SW Contact:    Bjorn Pippin, LCSW Phone Number: 08/25/2022, 11:51 AM  Clinical Narrative:  Pt was out of room due to procedure, but daughter and son-in law were in room. CSW spoke with family and they reported that pt understands and knows she has to have additional help before returning home. Daughter requested CSW fax referral out to [facility]. Daughter reported her dad was just there last year and feels good about sending her mom there as well.   CSW completed fl2 and faxed out. TOC will continue to follow.                  Expected Discharge Plan: Skilled Nursing Facility Barriers to Discharge: Continued Medical Work up   Patient Goals and CMS Choice Patient states their goals for this hospitalization and ongoing recovery are:: return home          Expected Discharge Plan and Services   Discharge Planning Services: CM Consult   Living arrangements for the past 2 months: Single Family Home                                      Prior Living Arrangements/Services Living arrangements for the past 2 months: Single Family Home Lives with:: Self Patient language and need for interpreter reviewed:: Yes Do you feel safe going back to the place where you live?: Yes      Need for Family Participation in Patient Care: Yes (Comment) Care giver support system in place?: Yes (comment) Current home services: DME Criminal Activity/Legal Involvement Pertinent to Current Situation/Hospitalization: No - Comment as needed  Activities of Daily Living Home Assistive Devices/Equipment: Gilford Rile (specify type) ADL Screening (condition at time of admission) Patient's cognitive ability adequate to safely complete daily activities?: Yes Is the patient deaf or have difficulty hearing?: Yes Does the patient have  difficulty seeing, even when wearing glasses/contacts?: No Does the patient have difficulty concentrating, remembering, or making decisions?: No Patient able to express need for assistance with ADLs?: Yes Does the patient have difficulty dressing or bathing?: No Independently performs ADLs?: Yes (appropriate for developmental age) Does the patient have difficulty walking or climbing stairs?: Yes Weakness of Legs: Both Weakness of Arms/Hands: None  Permission Sought/Granted                  Emotional Assessment Appearance:: Appears stated age Attitude/Demeanor/Rapport: Gracious Affect (typically observed): Accepting Orientation: : Oriented to Self, Oriented to Place, Oriented to  Time, Oriented to Situation Alcohol / Substance Use: Not Applicable Psych Involvement: No (comment)  Admission diagnosis:  Gastrointestinal hemorrhage with melena [K92.1] Gastrointestinal hemorrhage, unspecified gastrointestinal hemorrhage type [K92.2] Anemia, unspecified type [D64.9] Patient Active Problem List   Diagnosis Date Noted   Gastrointestinal hemorrhage with melena 08/23/2022   ABLA (acute blood loss anemia) 08/23/2022   HTN (hypertension) 08/23/2022   A-fib ([place]) 08/23/2022   DM2 (diabetes mellitus, type 2) ([place]) 08/23/2022   Pain in joint of left knee 01/06/2021   Adverse effect of antihyperlipidemic and antiarteriosclerotic drugs, initial encounter 08/22/2020   Age-related physical debility 08/22/2020   Chronic congestive heart failure ([place]) 08/22/2020   Chronic kidney disease, stage 4 (severe) ([place]) 08/22/2020   Diabetic renal disease ([place]) 08/22/2020   Gastroesophageal  reflux disease 08/22/2020   History of malignant neoplasm of skin 08/22/2020   Hypertensive heart and chronic kidney disease with heart failure and stage 1 through stage 4 chronic kidney disease, or unspecified chronic kidney disease ([place]) 08/22/2020   Iron deficiency anemia 08/22/2020   Mixed hyperlipidemia  08/22/2020   Morbid obesity ([place]) 08/22/2020   Osteoarthritis 08/22/2020   Personal history of colonic polyps 08/22/2020   Skin ulcer ([place]) 08/22/2020   Cervical spondylosis with radiculopathy 07/26/2017   Small bowel obstruction ([place]) 04/01/2015   PCP:  Collene Leyden, MD Pharmacy:   [id number] [id number]: [phone]: [phone]    Social Determinants of Health (SDOH) Social History: SDOH Screenings   Food Insecurity: No Food Insecurity (08/24/2022)  Housing: Low Risk  (08/24/2022)  Transportation Needs: No Transportation Needs (08/24/2022)  Utilities: Not At Risk (08/24/2022)  Tobacco Use: Low Risk  (08/25/2022)   SDOH Interventions:     Readmission Risk Interventions     No data to display         JBeckey Rutter MSW, LCSWA, LCASA Transitions of Care  Clinical Social Worker I

## 2022-08-25 NOTE — Anesthesia Preprocedure Evaluation (Signed)
Anesthesia Evaluation  Patient identified by MRN, date of birth, ID band Patient awake    Reviewed: Allergy & Precautions, NPO status , Patient's Chart, lab work & pertinent test results  History of Anesthesia Complications Negative for: history of anesthetic complications  Airway Mallampati: IV  TM Distance: <3 FB Neck ROM: Full    Dental  (+) Dental Advisory Given   Pulmonary neg pulmonary ROS   breath sounds clear to auscultation       Cardiovascular hypertension, Pt. on medications and Pt. on home beta blockers +CHF   Rhythm:Irregular  Left ventricle: The cavity size was normal. There appeared to    asymmetric hypertrophy of the LV apex. Systolic function was    normal. The estimated ejection fraction was in the range of 60%    to 65%. Wall motion was normal; there were no regional wall    motion abnormalities. Features are consistent with a pseudonormal    left ventricular filling pattern, with concomitant abnormal    relaxation and increased filling pressure (grade 2 diastolic    dysfunction).  - Aortic valve: There was no stenosis.  - Mitral valve: Mildly calcified annulus. There was trivial    regurgitation.  - Left atrium: The atrium was moderately dilated.  - Right ventricle: The cavity size was normal. Systolic function    was normal.  - Tricuspid valve: Peak RV-RA gradient (S): 45 mm Hg.  - Pulmonary arteries: PA peak pressure: 48 mm Hg (S).  - Inferior vena cava: The vessel was normal in size. The    respirophasic diameter changes were in the normal range (= 50%),    consistent with normal central venous pressure.     Neuro/Psych negative neurological ROS     GI/Hepatic Neg liver ROS,GERD  ,,  Endo/Other  diabetes  Morbid obesityLab Results      Component                Value               Date                      HGBA1C                   5.1                 08/24/2022             Renal/GU ARF and  CRFRenal disease     Musculoskeletal  (+) Arthritis ,    Abdominal   Peds  Hematology  (+) Blood dyscrasia, anemia Lab Results      Component                Value               Date                      WBC                      6.1                 08/25/2022                HGB                      7.6 (L)  08/25/2022                HCT                      22.5 (L)            08/25/2022                MCV                      93.0                08/25/2022                PLT                      198                 08/25/2022              Anesthesia Other Findings   Reproductive/Obstetrics                              Anesthesia Physical Anesthesia Plan  ASA: 3  Anesthesia Plan: MAC   Post-op Pain Management: Minimal or no pain anticipated   Induction: Intravenous  PONV Risk Score and Plan: 2 and Propofol infusion and Treatment may vary due to age or medical condition  Airway Management Planned: Nasal Cannula and Natural Airway  Additional Equipment: None  Intra-op Plan:   Post-operative Plan:   Informed Consent: I have reviewed the patients History and Physical, chart, labs and discussed the procedure including the risks, benefits and alternatives for the proposed anesthesia with the patient or authorized representative who has indicated his/her understanding and acceptance.     Dental advisory given  Plan Discussed with: CRNA  Anesthesia Plan Comments:          Anesthesia Quick Evaluation

## 2022-08-25 NOTE — Op Note (Signed)
[facility] Patient Name: Tabitha Black Procedure Date : 08/25/2022 MRN: [id number] Attending MD: Lear Ng , MD, [id number] Date of Birth: 1933/06/09 CSN: [id number] Age: 89 Admit Type: Inpatient Procedure:                Upper GI endoscopy Indications:              Melena Providers:                Lear Ng, MD, Jaci Carrel, RN,                            Benetta Spar, Technician Referring MD:             hospital team Medicines:                Propofol per Anesthesia, Monitored Anesthesia Care Complications:            No immediate complications. Estimated Blood Loss:     Estimated blood loss was minimal. Procedure:                Pre-Anesthesia Assessment:                           - Prior to the procedure, a History and Physical                            was performed, and patient medications and                            allergies were reviewed. The patient's tolerance of                            previous anesthesia was also reviewed. The risks                            and benefits of the procedure and the sedation                            options and risks were discussed with the patient.                            All questions were answered, and informed consent                            was obtained. Prior Anticoagulants: The patient has                            taken Eliquis (apixaban), last dose was stopped at                            admission. ASA Grade Assessment: IV - A patient                            with severe systemic disease that is a constant  threat to life. After reviewing the risks and                            benefits, the patient was deemed in satisfactory                            condition to undergo the procedure.                           After obtaining informed consent, the endoscope was                            passed under direct vision. Throughout the                             procedure, the patient's blood pressure, pulse, and                            oxygen saturations were monitored continuously. The                            [id number]-[id number] ([id number]) Olympus endoscope was introduced                            through the mouth, and advanced to the second part                            of duodenum. The upper GI endoscopy was                            accomplished without difficulty. The patient                            tolerated the procedure well. Scope In: Scope Out: Findings:      The examined esophagus was normal.      The Z-line was regular and was found 40 cm from the incisors.      Segmental moderate inflammation characterized by congestion (edema),       erosions, erythema and shallow ulcerations was found in the gastric       antrum. Biopsies were taken with a cold forceps for histology. Estimated       blood loss was minimal.      The cardia and gastric fundus were normal on retroflexion.      The exam of the stomach was otherwise normal.      The examined duodenum was normal. Impression:               - Normal esophagus.                           - Z-line regular, 40 cm from the incisors.                           - Acute gastritis. Biopsied.                           -  Normal examined duodenum. Recommendation:           - Full liquid diet.                           - Observe patient's clinical course.                           - Await pathology results. Procedure Code(s):        --- Professional ---                           [phone], Esophagogastroduodenoscopy, flexible,                            transoral; with biopsy, single or multiple Diagnosis Code(s):        --- Professional ---                           K92.1, Melena (includes Hematochezia)                           K29.00, Acute gastritis without bleeding CPT copyright 2022 American Medical Association. All rights reserved. The codes documented in this report are  preliminary and upon coder review may  be revised to meet current compliance requirements. Lear Ng, MD 08/25/2022 10:46:22 AM This report has been signed electronically. Number of Addenda: 0

## 2022-08-25 NOTE — Plan of Care (Signed)

## 2022-08-25 NOTE — Anesthesia Procedure Notes (Signed)
Procedure Name: MAC Date/Time: 08/25/2022 10:20 AM  Performed by: Janene Harvey, CRNAPre-anesthesia Checklist: Patient identified, Emergency Drugs available, Suction available and Patient being monitored Patient Re-evaluated:Patient Re-evaluated prior to induction Oxygen Delivery Method: Nasal cannula Induction Type: IV induction Placement Confirmation: positive ETCO2 Dental Injury: Teeth and Oropharynx as per pre-operative assessment  Comments: Heated high flow

## 2022-08-25 NOTE — Transfer of Care (Signed)
Immediate Anesthesia Transfer of Care Note  Patient: GENEVER HENTGES  Procedure(s) Performed: ESOPHAGOGASTRODUODENOSCOPY (EGD) WITH PROPOFOL BIOPSY  Patient Location: PACU  Anesthesia Type:MAC  Level of Consciousness: awake and patient cooperative  Airway & Oxygen Therapy: Patient Spontanous Breathing and Patient connected to nasal cannula oxygen  Post-op Assessment: Report given to RN and Post -op Vital signs reviewed and stable  Post vital signs: Reviewed and stable  Last Vitals:  Vitals Value Taken Time  BP 150/71 08/25/22 1045  Temp    Pulse 92 08/25/22 1046  Resp 24 08/25/22 1046  SpO2 98 % 08/25/22 1046  Vitals shown include unvalidated device data.  Last Pain:  Vitals:   08/25/22 0914  TempSrc: Temporal  PainSc: 0-No pain         Complications: No notable events documented.

## 2022-08-25 NOTE — Evaluation (Addendum)
Occupational Therapy Evaluation Patient Details Name: Tabitha Black MRN: [id number] DOB: 03-Oct-1932 Today's Date: 08/25/2022   History of Present Illness 89 y.o. female presents to [facility] hospital on 08/23/2022 with melena and acute blood loss anemia. PMH includes CKD, DMII, GERD, HLD, HTN, OA.   Clinical Impression   Pt lives alone, reports independence at baseline with ADLs (although reports needing increased time), and uses 3 wheeled walker for mobility. Pt sleeps in lift chair and has been sponge bathing at baseline due to difficulty stepping into walk-in shower. Pt currently needing min-max A for ADLs, mod A for bed mobility, and mod A for transfers with RW. SpO2 dropping into high 80's with mobility attempts on 2L O2, returned to 90's once seated at EOB. Pt presenting with impairments listed below, will follow acutely. Recommend SNF at d/c.     Recommendations for follow up therapy are one component of a multi-disciplinary discharge planning process, led by the attending physician.  Recommendations may be updated based on patient status, additional functional criteria and insurance authorization.   Follow Up Recommendations  Skilled nursing-short term rehab (<3 hours/day)     Assistance Recommended at Discharge Frequent or constant Supervision/Assistance  Patient can return home with the following A lot of help with walking and/or transfers;A lot of help with bathing/dressing/bathroom;Assistance with cooking/housework;Assist for transportation;Help with stairs or ramp for entrance    Functional Status Assessment  Patient has had a recent decline in their functional status and demonstrates the ability to make significant improvements in function in a reasonable and predictable amount of time.  Equipment Recommendations  Other (comment) (defer)    Recommendations for Other Services PT consult     Precautions / Restrictions Precautions Precautions: Fall Precaution Comments: BLE  edema/wounds Restrictions Weight Bearing Restrictions: No      Mobility Bed Mobility Overal bed mobility: Needs Assistance Bed Mobility: Supine to Sit     Supine to sit: Mod assist, HOB elevated     General bed mobility comments: mod A for trunk elevation and to use pad to scoot hips to EOB    Transfers Overall transfer level: Needs assistance Equipment used: Rolling walker (2 wheels) Transfers: Sit to/from Stand, Bed to chair/wheelchair/BSC Sit to Stand: Mod assist           General transfer comment: Pt needing mod A to stand from bed in low position      Balance Overall balance assessment: Needs assistance Sitting-balance support: No upper extremity supported, Feet supported Sitting balance-Leahy Scale: Fair     Standing balance support: Bilateral upper extremity supported, Reliant on assistive device for balance Standing balance-Leahy Scale: Poor                             ADL either performed or assessed with clinical judgement   ADL Overall ADL's : Needs assistance/impaired Eating/Feeding: NPO   Grooming: Set up;Sitting   Upper Body Bathing: Minimal assistance   Lower Body Bathing: Maximal assistance   Upper Body Dressing : Minimal assistance   Lower Body Dressing: Maximal assistance   Toilet Transfer: Moderate assistance;Rolling walker (2 wheels)   Toileting- Clothing Manipulation and Hygiene: Maximal assistance       Functional mobility during ADLs: Moderate assistance;Rolling walker (2 wheels)       Vision   Vision Assessment?: No apparent visual deficits     Perception Perception Perception Tested?: No   Praxis Praxis Praxis tested?: Not tested  Pertinent Vitals/Pain Pain Assessment Pain Assessment: Faces Pain Score: 2  Faces Pain Scale: Hurts a little bit Pain Location: LLE Pain Descriptors / Indicators: Sore Pain Intervention(s): Limited activity within patient's tolerance, Monitored during session      Hand Dominance Right   Extremity/Trunk Assessment Upper Extremity Assessment Upper Extremity Assessment: Generalized weakness   Lower Extremity Assessment Lower Extremity Assessment: Defer to PT evaluation   Cervical / Trunk Assessment Cervical / Trunk Assessment: Normal   Communication Communication Communication: HOH   Cognition Arousal/Alertness: Awake/alert Behavior During Therapy: WFL for tasks assessed/performed Overall Cognitive Status: Within Functional Limits for tasks assessed                                       General Comments  SpO2 dropping into high 80's with mobility on 2L    Exercises     Shoulder Instructions      Home Living Family/patient expects to be discharged to:: Private residence Living Arrangements: Alone Available Help at Discharge: Family;Available PRN/intermittently (sister) Type of Home: House Home Access: Ramped entrance     Home Layout: One level     Bathroom Shower/Tub: Occupational psychologist: Standard     Home Equipment: Toilet riser;Shower seat;Grab bars - toilet;Grab bars - tub/shower;Other (comment)   Additional Comments: does not wear O2 at home, sleeps in lift chair      Prior Functioning/Environment Prior Level of Function : Needs assist;Driving             Mobility Comments: uses RW ADLs Comments: independent but says it takes some time, drives short distances, sponge bathes due to step into shower being to high        OT Problem List: Decreased strength;Decreased range of motion;Decreased activity tolerance;Impaired balance (sitting and/or standing);Decreased safety awareness;Cardiopulmonary status limiting activity      OT Treatment/Interventions: Self-care/ADL training;Therapeutic exercise;Energy conservation;DME and/or AE instruction;Therapeutic activities;Patient/family education;Balance training    OT Goals(Current goals can be found in the care plan section) Acute  Rehab OT Goals Patient Stated Goal: none stated OT Goal Formulation: With patient Time For Goal Achievement: 09/08/22 Potential to Achieve Goals: Good ADL Goals Pt Will Perform Upper Body Dressing: with min assist;sitting Pt Will Perform Lower Body Dressing: with mod assist;sit to/from stand;sitting/lateral leans Pt Will Transfer to Toilet: with min assist;ambulating;regular height toilet Pt Will Perform Tub/Shower Transfer: Shower transfer;shower seat;ambulating;with min assist Additional ADL Goal #1: pt will be able to stand x5 min for functional task in order to improve activity tolerance for ADLs  OT Frequency: Min 2X/week    Co-evaluation              AM-PAC OT "6 Clicks" Daily Activity     Outcome Measure Help from another person eating meals?:  (NPO) Help from another person taking care of personal grooming?: A Little Help from another person toileting, which includes using toliet, bedpan, or urinal?: A Lot Help from another person bathing (including washing, rinsing, drying)?: A Lot Help from another person to put on and taking off regular upper body clothing?: A Little Help from another person to put on and taking off regular lower body clothing?: A Lot 6 Click Score: 12   End of Session Equipment Utilized During Treatment: Gait belt;Rolling walker (2 wheels);Oxygen (2L) Nurse Communication: Mobility status; pt's IV site bleeding  Activity Tolerance: Patient tolerated treatment well Patient left: in bed;with call bell/phone  within reach;with bed alarm set;with nursing/sitter in room;with family/visitor present  OT Visit Diagnosis: Unsteadiness on feet (R26.81);Other abnormalities of gait and mobility (R26.89);Muscle weakness (generalized) (M62.81)                Time: 2947-6546 OT Time Calculation (min): 27 min Charges:  OT General Charges $OT Visit: 1 Visit OT Evaluation $OT Eval Moderate Complexity: 1 Mod OT Treatments $Self Care/Home Management : 8-22  mins  Renaye Rakers, OTD, OTR/L SecureChat Preferred Acute Rehab [phone] - [place] Koonce 08/25/2022, 8:57 AM

## 2022-08-25 NOTE — Interval H&P Note (Signed)
History and Physical Interval Note:  08/25/2022 10:14 AM  Tabitha Black  has presented today for surgery, with the diagnosis of gi bleed.  The various methods of treatment have been discussed with the patient and family. After consideration of risks, benefits and other options for treatment, the patient has consented to  Procedure(s): ESOPHAGOGASTRODUODENOSCOPY (EGD) WITH PROPOFOL (N/A) as a surgical intervention.  The patient's history has been reviewed, patient examined, no change in status, stable for surgery.  I have reviewed the patient's chart and labs.  Questions were answered to the patient's satisfaction.     Lear Ng

## 2022-08-26 DIAGNOSIS — I4891 Unspecified atrial fibrillation: Secondary | ICD-10-CM | POA: Diagnosis not present

## 2022-08-26 DIAGNOSIS — N184 Chronic kidney disease, stage 4 (severe): Secondary | ICD-10-CM | POA: Diagnosis not present

## 2022-08-26 DIAGNOSIS — K921 Melena: Secondary | ICD-10-CM | POA: Diagnosis not present

## 2022-08-26 DIAGNOSIS — D62 Acute posthemorrhagic anemia: Secondary | ICD-10-CM | POA: Diagnosis not present

## 2022-08-26 LAB — CBC
HCT: 26.5 % — ABNORMAL LOW (ref 36.0–46.0)
HCT: 24.5 % — ABNORMAL LOW (ref 36.0–46.0)
Hemoglobin: 8.4 g/dL — ABNORMAL LOW (ref 12.0–15.0)
Hemoglobin: 7.9 g/dL — ABNORMAL LOW (ref 12.0–15.0)
MCH: 29.9 pg (ref 26.0–34.0)
MCH: 30.3 pg (ref 26.0–34.0)
MCHC: 31.7 g/dL (ref 30.0–36.0)
MCHC: 32.2 g/dL (ref 30.0–36.0)
MCV: 94.3 fL (ref 80.0–100.0)
MCV: 93.9 fL (ref 80.0–100.0)
Platelets: 206 10*3/uL (ref 150–400)
Platelets: 221 10*3/uL (ref 150–400)
RBC: 2.81 MIL/uL — ABNORMAL LOW (ref 3.87–5.11)
RBC: 2.61 MIL/uL — ABNORMAL LOW (ref 3.87–5.11)
RDW: 15.3 % (ref 11.5–15.5)
RDW: 15.2 % (ref 11.5–15.5)
WBC: 6.1 10*3/uL (ref 4.0–10.5)
WBC: 5.1 10*3/uL (ref 4.0–10.5)
nRBC: 0 % (ref 0.0–0.2)
nRBC: 0 % (ref 0.0–0.2)

## 2022-08-26 LAB — BASIC METABOLIC PANEL
Anion gap: 16 — ABNORMAL HIGH (ref 5–15)
BUN: 55 mg/dL — ABNORMAL HIGH (ref 8–23)
CO2: 22 mmol/L (ref 22–32)
Calcium: 9.5 mg/dL (ref 8.9–10.3)
Chloride: 102 mmol/L (ref 98–111)
Creatinine, Ser: 2.44 mg/dL — ABNORMAL HIGH (ref 0.44–1.00)
GFR, Estimated: 19 mL/min — ABNORMAL LOW (ref >60)
Glucose, Bld: 107 mg/dL — ABNORMAL HIGH (ref 70–99)
Potassium: 3.2 mmol/L — ABNORMAL LOW (ref 3.5–5.1)
Sodium: 140 mmol/L (ref 135–145)

## 2022-08-26 LAB — GLUCOSE, CAPILLARY
Glucose-Capillary: 112 mg/dL — ABNORMAL HIGH (ref 70–99)
Glucose-Capillary: 108 mg/dL — ABNORMAL HIGH (ref 70–99)
Glucose-Capillary: 113 mg/dL — ABNORMAL HIGH (ref 70–99)
Glucose-Capillary: 127 mg/dL — ABNORMAL HIGH (ref 70–99)

## 2022-08-26 LAB — SURGICAL PATHOLOGY

## 2022-08-26 MED ORDER — POTASSIUM CHLORIDE CRYS ER 20 MEQ PO TBCR
40.0000 meq | EXTENDED_RELEASE_TABLET | Freq: Every day | ORAL | Status: DC
  Administered 2022-08-26 – 2022-08-27 (×2): 40 meq via ORAL
  Filled 2022-08-26 (×2): qty 2

## 2022-08-26 NOTE — Progress Notes (Signed)
Physical Therapy Treatment Patient Details Name: Tabitha Black MRN: [id number] DOB: 01/03/1933 Today's Date: 08/26/2022   History of Present Illness 89 y.o. female presents to [facility] hospital on 08/23/2022 with melena and acute blood loss anemia. PMH includes CKD, DMII, GERD, HLD, HTN, OA.    PT Comments    Pt reporting feeling sleepy and feeble today, but is agreeable to PT session. PT focus of session on functional mobility and OOB time. Pt overall requiring mod physical assist for bed mobility and transfer to recliner at bedside, pt unable to progress beyond step pivot transfer given fatigue and weakness. See sats during session below. Plan remains appropriate.   SATURATION QUALIFICATIONS: (This note is used to comply with regulatory documentation for home oxygen)  Patient Saturations on Room Air at Rest = 89%  Patient Saturations on Room Air while Ambulating = 86%  Patient Saturations on 2 Liters of oxygen while Ambulating = 90%  Please briefly explain why patient needs home oxygen: transfer-level mobility requiring 2LO2 to maintain SPO2 >88%    Recommendations for follow up therapy are one component of a multi-disciplinary discharge planning process, led by the attending physician.  Recommendations may be updated based on patient status, additional functional criteria and insurance authorization.  Follow Up Recommendations  Skilled nursing-short term rehab (<3 hours/day)     Assistance Recommended at Discharge Frequent or constant Supervision/Assistance  Patient can return home with the following A lot of help with walking and/or transfers;A lot of help with bathing/dressing/bathroom;Assistance with cooking/housework;Assist for transportation;Help with stairs or ramp for entrance   Equipment Recommendations  Other (comment) (defer)    Recommendations for Other Services       Precautions / Restrictions Precautions Precautions: Fall Precaution Comments: BLE  edema/wounds Restrictions Weight Bearing Restrictions: No     Mobility  Bed Mobility Overal bed mobility: Needs Assistance Bed Mobility: Supine to Sit     Supine to sit: Mod assist, HOB elevated     General bed mobility comments: assist for LE progression to EOB, trunk elevation, and scooting hips to EOB with use of bed pad. Increased time, sequencing cues needed.    Transfers Overall transfer level: Needs assistance Equipment used: Rolling walker (2 wheels) Transfers: Sit to/from Stand, Bed to chair/wheelchair/BSC Sit to Stand: Min assist, From elevated surface   Step pivot transfers: Min assist, From elevated surface       General transfer comment: assist for power up, steadying once standing, weight shifting to progress UEs from EOB to RW, stepping to recliner through a series of forward and lateral steps. Pt limited in further distance by fatigue and feeling "feeble"    Ambulation/Gait               General Gait Details: nt   Stairs             Wheelchair Mobility    Modified Rankin (Stroke Patients Only)       Balance Overall balance assessment: Needs assistance Sitting-balance support: No upper extremity supported, Feet supported Sitting balance-Leahy Scale: Fair     Standing balance support: Bilateral upper extremity supported, Reliant on assistive device for balance Standing balance-Leahy Scale: Poor                              Cognition Arousal/Alertness: Awake/alert Behavior During Therapy: WFL for tasks assessed/performed Overall Cognitive Status: Within Functional Limits for tasks assessed  Exercises General Exercises - Lower Extremity Long Arc Quad: AROM, Both, 5 reps, Seated    General Comments General comments (skin integrity, edema, etc.): SpO2 to 86% on RA during mobility, placed back on 2LO2 for recovery to 90%      Pertinent Vitals/Pain Pain  Assessment Pain Assessment: Faces Faces Pain Scale: Hurts little more Pain Location: LEs Pain Descriptors / Indicators: Sore Pain Intervention(s): Limited activity within patient's tolerance, Monitored during session, Repositioned    Home Living                          Prior Function            PT Goals (current goals can now be found in the care plan section) Acute Rehab PT Goals Patient Stated Goal: to regain strength, return to household ambulation PT Goal Formulation: With patient/family Time For Goal Achievement: 09/07/22 Potential to Achieve Goals: Fair Progress towards PT goals: Progressing toward goals    Frequency    Min 2X/week      PT Plan Current plan remains appropriate    Co-evaluation              AM-PAC PT "6 Clicks" Mobility   Outcome Measure  Help needed turning from your back to your side while in a flat bed without using bedrails?: A Lot Help needed moving from lying on your back to sitting on the side of a flat bed without using bedrails?: A Lot Help needed moving to and from a bed to a chair (including a wheelchair)?: A Lot Help needed standing up from a chair using your arms (e.g., wheelchair or bedside chair)?: A Lot Help needed to walk in hospital room?: Total Help needed climbing 3-5 steps with a railing? : Total 6 Click Score: 10    End of Session Equipment Utilized During Treatment: Gait belt;Oxygen Activity Tolerance: Patient limited by fatigue Patient left: in chair;with call bell/phone within reach;with family/visitor present;with chair alarm set Nurse Communication: Mobility status;Other (comment) (handoff with NT, mobility specialist) PT Visit Diagnosis: Other abnormalities of gait and mobility (R26.89);Muscle weakness (generalized) (M62.81)     Time: 0938-1829 PT Time Calculation (min) (ACUTE ONLY): 23 min  Charges:  $Therapeutic Activity: 23-37 mins                    Stacie Glaze, PT DPT Acute Rehabilitation  Services Pager [phone]  Office [phone]    Roxine Caddy E Ruffin Pyo 08/26/2022, 10:33 AM

## 2022-08-26 NOTE — Progress Notes (Signed)
PROGRESS NOTE        PATIENT DETAILS Name: Tabitha Black Age: 89 y.o. Sex: female Date of Birth: Feb 25, 1933 Admit Date: 08/23/2022 Admitting Physician Etta Quill, DO [id number], Geryl Rankins, MD  Brief Summary: Patient is a 89 y.o.  female CKD 4, A-fib on Eliquis, HTN-who presented with melena and acute blood loss anemia.  Significant events: 2/5>> admit to [facility]-GI bleed with acute blood loss anemia.  Significant studies: None  Significant microbiology data: None  Procedures: 2/7>> EGD: Inflammation/shallow ulcerations in the gastric antrum  Consults: GI  Subjective: Some melanotic appearing stools yesterday.  Tolerating full liquids.  Objective: Vitals: Blood pressure (!) 171/89, pulse 94, temperature 97.7 F (36.5 C), temperature source Oral, resp. rate (!) 22, height '5\' 3"'$  (1.6 m), weight 91.6 kg, SpO2 94 %.   Exam: Gen Exam:Alert awake-not in any distress HEENT:atraumatic, normocephalic Chest: B/L clear to auscultation anteriorly CVS:S1S2 regular Abdomen:soft non tender, non distended Extremities:no edema Neurology: Non focal Skin: no rash  Pertinent Labs/Radiology:    Latest Ref Rng & Units 08/26/2022    7:04 AM 08/25/2022   11:43 AM 08/25/2022    5:37 AM  CBC  WBC 4.0 - 10.5 K/uL 6.1  6.0  6.1   Hemoglobin 12.0 - 15.0 g/dL 8.4  8.3  7.6   Hematocrit 36.0 - 46.0 % 26.5  25.1  22.5   Platelets 150 - 400 K/uL 206  216  198     Lab Results  Component Value Date   NA 140 08/26/2022   K 3.2 (L) 08/26/2022   CL 102 08/26/2022   CO2 22 08/26/2022      Assessment/Plan: Upper GI bleed with acute blood loss anemia EGD 2/7 with small gastric ulcers-likely the etiology of bleeding Hemoglobin stable-suspect melanotic appearing stools yesterday was old blood making its way out Continue PPI Await biopsy results Slowly advance diet Await further recommendations from gastroenterology.  Chronic atrial fibrillation Rate controlled Eliquis  on hold-resume when okay with gastroenterology. Continue beta-blocker Telemetry monitoring  Chronic HFpEF Seems stable-lymphedema at baseline Continue diuretics  HTN BP on the higher side Allow some amount of permissive hypertension given GI bleed Continue beta-blocker/Cardura  CKD stage IV At baseline Follow electrolytes periodically  Hypokalemia Replete/recheck  DM-2 SSI Recent Labs    08/25/22 1631 08/25/22 2108 08/26/22 0604  GLUCAP 128* 141* 112*      B/L Lower ext Lymphedema Left leg wound-present prior to admit Chronic issue Appreciate wound care eval  Obesity: Estimated body mass index is 35.78 kg/m as calculated from the following:   Height as of this encounter: '5\' 3"'$  (1.6 m).   Weight as of this encounter: 91.6 kg.   Code status:   Code Status: Full Code   DVT Prophylaxis: SCDs Start: 08/23/22 2040   Family Communication: None at bedside   Disposition Plan: Status is: Inpatient Remains inpatient appropriate because: Severity of illness   Planned Discharge Destination:SNF   Diet: Diet Order             Diet full liquid Room service appropriate? Yes; Fluid consistency: Thin  Diet effective now                     Antimicrobial agents: Anti-infectives (From admission, onward)    None        MEDICATIONS: Scheduled Meds:  doxazosin  4 mg Oral Daily   furosemide  80 mg Oral Daily   hydrocerin   Topical BID   insulin aspart  0-6 Units Subcutaneous TID WC   metoprolol succinate  100 mg Oral QPM   pantoprazole (PROTONIX) IV  40 mg Intravenous Q12H   Continuous Infusions:  PRN Meds:.acetaminophen **OR** acetaminophen, ipratropium-albuterol, labetalol, ondansetron **OR** ondansetron (ZOFRAN) IV, mouth rinse   I have personally reviewed following labs and imaging studies  LABORATORY DATA: CBC: Recent Labs  Lab 08/23/22 1803 08/24/22 0105 08/24/22 0741 08/24/22 1706 08/25/22 0537 08/25/22 1143 08/26/22 0704  WBC  4.5   < > 6.1 6.8 6.1 6.0 6.1  NEUTROABS 3.0  --   --   --   --   --   --   HGB 6.6*   < > 8.4* 8.5* 7.6* 8.3* 8.4*  HCT 20.1*   < > 25.1* 25.9* 22.5* 25.1* 26.5*  MCV 95.7   < > 94.0 93.5 93.0 93.0 94.3  PLT 190   < > 190 212 198 216 206   < > = values in this interval not displayed.     Basic Metabolic Panel: Recent Labs  Lab 08/23/22 1803 08/24/22 1706 08/25/22 0537 08/26/22 0704  NA 138 141 141 140  K 3.8 3.5 3.3* 3.2*  CL 104 108 106 102  CO2 17* 19* 20* 22  GLUCOSE 121* 121* 119* 107*  BUN 58* 52* 55* 55*  CREATININE 2.59* 2.50* 2.61* 2.44*  CALCIUM 9.5 9.6 9.2 9.5     GFR: Estimated Creatinine Clearance: 17.5 mL/min (A) (by C-G formula based on SCr of 2.44 mg/dL (H)).  Liver Function Tests: Recent Labs  Lab 08/23/22 1803  AST 27  ALT 14  ALKPHOS 69  BILITOT 1.1  PROT 6.5  ALBUMIN 3.2*    Recent Labs  Lab 08/23/22 1803  LIPASE 30    No results for input(s): "AMMONIA" in the last 168 hours.  Coagulation Profile: Recent Labs  Lab 08/23/22 1803  INR 1.5*     Cardiac Enzymes: No results for input(s): "CKTOTAL", "CKMB", "CKMBINDEX", "TROPONINI" in the last 168 hours.  BNP (last 3 results) No results for input(s): "PROBNP" in the last 8760 hours.  Lipid Profile: No results for input(s): "CHOL", "HDL", "LDLCALC", "TRIG", "CHOLHDL", "LDLDIRECT" in the last 72 hours.  Thyroid Function Tests: No results for input(s): "TSH", "T4TOTAL", "FREET4", "T3FREE", "THYROIDAB" in the last 72 hours.  Anemia Panel: No results for input(s): "VITAMINB12", "FOLATE", "FERRITIN", "TIBC", "IRON", "RETICCTPCT" in the last 72 hours.  Urine analysis:    Component Value Date/Time   COLORURINE AMBER (A) 04/01/2015 0425   APPEARANCEUR CLOUDY (A) 04/01/2015 0425   LABSPEC 1.020 04/01/2015 0425   PHURINE 5.0 04/01/2015 0425   GLUCOSEU NEGATIVE 04/01/2015 0425   HGBUR NEGATIVE 04/01/2015 0425   BILIRUBINUR MODERATE (A) 04/01/2015 0425   KETONESUR 15 (A) 04/01/2015  0425   PROTEINUR 30 (A) 04/01/2015 0425   UROBILINOGEN 1.0 04/01/2015 0425   NITRITE NEGATIVE 04/01/2015 0425   LEUKOCYTESUR SMALL (A) 04/01/2015 0425    Sepsis Labs: Lactic Acid, Venous No results found for: "LATICACIDVEN"  MICROBIOLOGY: Recent Results (from the past 240 hour(s))  MRSA Next Gen by PCR, Nasal     Status: None   Collection Time: 08/24/22 11:19 AM   Specimen: Nasal Mucosa; Nasal Swab  Result Value Ref Range Status   MRSA by PCR Next Gen NOT DETECTED NOT DETECTED Final    Comment: (NOTE) The GeneXpert MRSA Assay (FDA approved for  NASAL specimens only), is one component of a comprehensive MRSA colonization surveillance program. It is not intended to diagnose MRSA infection nor to guide or monitor treatment for MRSA infections. Test performance is not FDA approved in patients less than 68 years old. Performed at [facility], [place] [address], [place], [place] [address]     RADIOLOGY STUDIES/RESULTS: No results found.   LOS: 3 days   Oren Binet, MD  [facility]    To contact the attending provider between 7A-7P or the covering provider during after hours 7P-7A, please log into the web site [email or web address] and access using universal [place] password for that web site. If you do not have the password, please call the hospital operator.  08/26/2022, 10:31 AM

## 2022-08-26 NOTE — Care Management Important Message (Signed)
Important Message  Patient Details  Name: Tabitha Black MRN: [id number] Date of Birth: 07/17/1933   Medicare Important Message Given:  Yes     [name] Montine Circle 08/26/2022, 1:43 PM

## 2022-08-26 NOTE — Progress Notes (Signed)
[facility]ology Progress Note  Tabitha Black 89 y.o. 17-Jun-1933   Subjective: Feels good. Denies melena  Objective: Vital signs: Vitals:   08/26/22 0724 08/26/22 1040  BP: (!) 171/89 (!) 160/98  Pulse: 94 89  Resp: (!) 22 17  Temp: 97.7 F (36.5 C) 97.7 F (36.5 C)  SpO2: 94% 96%    Physical Exam: Gen: lethargic, elderly, no acute distress  HEENT: anicteric sclera CV: RRR Chest: CTA B Abd: soft, nontender, nondistended, +BS Ext: no edema  Lab Results: Recent Labs    08/25/22 0537 08/26/22 0704  NA 141 140  K 3.3* 3.2*  CL 106 102  CO2 20* 22  GLUCOSE 119* 107*  BUN 55* 55*  CREATININE 2.61* 2.44*  CALCIUM 9.2 9.5   Recent Labs    08/23/22 1803  AST 27  ALT 14  ALKPHOS 69  BILITOT 1.1  PROT 6.5  ALBUMIN 3.2*   Recent Labs    08/23/22 1803 08/24/22 0105 08/25/22 1143 08/26/22 0704  WBC 4.5   < > 6.0 6.1  NEUTROABS 3.0  --   --   --   HGB 6.6*   < > 8.3* 8.4*  HCT 20.1*   < > 25.1* 26.5*  MCV 95.7   < > 93.0 94.3  PLT 190   < > 216 206   < > = values in this interval not displayed.      Assessment/Plan: Melena that has resolved. Hgb stable. Gastritis on EGD negative for H. Pylori. Heart healthy diet. Ok to resume Eliquis from GI standpoint. Will sign off. Call if questions. F/U with [facility] prn.   Tabitha Black 08/26/2022, 3:54 PM  Questions please call [phone] ID: Tabitha Black, female   DOB: [birth year], 89 y.o.   MRN: [id number]

## 2022-08-26 NOTE — Anesthesia Postprocedure Evaluation (Signed)
Anesthesia Post Note  Patient: Tabitha Black  Procedure(s) Performed: ESOPHAGOGASTRODUODENOSCOPY (EGD) WITH PROPOFOL BIOPSY     Patient location during evaluation: Endoscopy Anesthesia Type: MAC Level of consciousness: awake and alert Pain management: pain level controlled Vital Signs Assessment: post-procedure vital signs reviewed and stable Respiratory status: spontaneous breathing, nonlabored ventilation and respiratory function stable Cardiovascular status: stable and blood pressure returned to baseline Postop Assessment: no apparent nausea or vomiting Anesthetic complications: no   No notable events documented.  Last Vitals:  Vitals:   08/26/22 0724 08/26/22 1040  BP: (!) 171/89 (!) 160/98  Pulse: 94 89  Resp: (!) 22 17  Temp: 36.5 C 36.5 C  SpO2: 94% 96%    Last Pain:  Vitals:   08/26/22 1148  TempSrc:   PainSc: 0-No pain                 Jocelynn Gioffre

## 2022-08-26 NOTE — Progress Notes (Signed)
Mobility Specialist Progress Note:   08/26/22 1244  Mobility  Activity Transferred from chair to bed  Level of Assistance Moderate assist, patient does 50-74%  Assistive Device Stedy  Activity Response Tolerated well  $Mobility charge 1 Mobility   Pt in chair asking to get back to bed. Complaints of leg soreness. ModA to stand in stedy. Left in bed with call bell in reach and all needs met.   G[facility] Rehman Levinson Mobility Specialist Please contact via [employer] or  Rehab Office at [phone]

## 2022-08-26 NOTE — TOC Progression Note (Signed)
Transition of Care [facility]) - Progression Note    Patient Details  Name: Tabitha Black MRN: [id number] Date of Birth: 11-20-1932  Transition of Care [facility]) CM/SW [place], LCSW Phone Number: 08/26/2022, 12:22 PM  Clinical Narrative:    CSW spoke with pt to inform her on bed off from [facility]. Pt would like to accept bed offer. CSW reached out to SNF and they stated they would have a bed available for pt tomorrow.   TOC will continue to follow.    Expected Discharge Plan: [place] Barriers to Discharge: Continued Medical Work up  Expected Discharge Plan and Services   Discharge Planning Services: CM Consult   Living arrangements for the past 2 months: Single Family Home                                       Social Determinants of Health (SDOH) Interventions SDOH Screenings   Food Insecurity: No Food Insecurity (08/24/2022)  Housing: Low Risk  (08/24/2022)  Transportation Needs: No Transportation Needs (08/24/2022)  Utilities: Not At Risk (08/24/2022)  Tobacco Use: Low Risk  (08/25/2022)    Readmission Risk Interventions     No data to display         Beckey Rutter, MSW, LCSWA, LCASA Transitions of Care  Clinical Social Worker I

## 2022-08-27 DIAGNOSIS — R58 Hemorrhage, not elsewhere classified: Secondary | ICD-10-CM | POA: Diagnosis not present

## 2022-08-27 DIAGNOSIS — K921 Melena: Secondary | ICD-10-CM | POA: Diagnosis not present

## 2022-08-27 DIAGNOSIS — L97929 Non-pressure chronic ulcer of unspecified part of left lower leg with unspecified severity: Secondary | ICD-10-CM | POA: Diagnosis not present

## 2022-08-27 DIAGNOSIS — K922 Gastrointestinal hemorrhage, unspecified: Secondary | ICD-10-CM | POA: Diagnosis not present

## 2022-08-27 DIAGNOSIS — I83029 Varicose veins of left lower extremity with ulcer of unspecified site: Secondary | ICD-10-CM | POA: Diagnosis not present

## 2022-08-27 DIAGNOSIS — R600 Localized edema: Secondary | ICD-10-CM | POA: Diagnosis not present

## 2022-08-27 DIAGNOSIS — Z7401 Bed confinement status: Secondary | ICD-10-CM | POA: Diagnosis not present

## 2022-08-27 DIAGNOSIS — M62838 Other muscle spasm: Secondary | ICD-10-CM | POA: Diagnosis not present

## 2022-08-27 DIAGNOSIS — K254 Chronic or unspecified gastric ulcer with hemorrhage: Secondary | ICD-10-CM | POA: Diagnosis not present

## 2022-08-27 DIAGNOSIS — I872 Venous insufficiency (chronic) (peripheral): Secondary | ICD-10-CM | POA: Diagnosis not present

## 2022-08-27 DIAGNOSIS — I10 Essential (primary) hypertension: Secondary | ICD-10-CM | POA: Diagnosis not present

## 2022-08-27 DIAGNOSIS — E785 Hyperlipidemia, unspecified: Secondary | ICD-10-CM | POA: Diagnosis not present

## 2022-08-27 DIAGNOSIS — D649 Anemia, unspecified: Secondary | ICD-10-CM | POA: Diagnosis not present

## 2022-08-27 DIAGNOSIS — R630 Anorexia: Secondary | ICD-10-CM | POA: Diagnosis not present

## 2022-08-27 DIAGNOSIS — E559 Vitamin D deficiency, unspecified: Secondary | ICD-10-CM | POA: Diagnosis not present

## 2022-08-27 DIAGNOSIS — K250 Acute gastric ulcer with hemorrhage: Secondary | ICD-10-CM | POA: Diagnosis not present

## 2022-08-27 DIAGNOSIS — I482 Chronic atrial fibrillation, unspecified: Secondary | ICD-10-CM | POA: Diagnosis not present

## 2022-08-27 DIAGNOSIS — N184 Chronic kidney disease, stage 4 (severe): Secondary | ICD-10-CM | POA: Diagnosis not present

## 2022-08-27 DIAGNOSIS — K219 Gastro-esophageal reflux disease without esophagitis: Secondary | ICD-10-CM | POA: Diagnosis not present

## 2022-08-27 DIAGNOSIS — D62 Acute posthemorrhagic anemia: Secondary | ICD-10-CM | POA: Diagnosis not present

## 2022-08-27 DIAGNOSIS — H9193 Unspecified hearing loss, bilateral: Secondary | ICD-10-CM | POA: Diagnosis not present

## 2022-08-27 DIAGNOSIS — I4891 Unspecified atrial fibrillation: Secondary | ICD-10-CM | POA: Diagnosis not present

## 2022-08-27 DIAGNOSIS — N19 Unspecified kidney failure: Secondary | ICD-10-CM | POA: Diagnosis not present

## 2022-08-27 DIAGNOSIS — G4700 Insomnia, unspecified: Secondary | ICD-10-CM | POA: Diagnosis not present

## 2022-08-27 DIAGNOSIS — Z7901 Long term (current) use of anticoagulants: Secondary | ICD-10-CM | POA: Diagnosis not present

## 2022-08-27 DIAGNOSIS — R52 Pain, unspecified: Secondary | ICD-10-CM | POA: Diagnosis not present

## 2022-08-27 DIAGNOSIS — R531 Weakness: Secondary | ICD-10-CM | POA: Diagnosis not present

## 2022-08-27 DIAGNOSIS — I83008 Varicose veins of unspecified lower extremity with ulcer other part of lower leg: Secondary | ICD-10-CM | POA: Diagnosis not present

## 2022-08-27 DIAGNOSIS — I5030 Unspecified diastolic (congestive) heart failure: Secondary | ICD-10-CM | POA: Diagnosis not present

## 2022-08-27 DIAGNOSIS — M1990 Unspecified osteoarthritis, unspecified site: Secondary | ICD-10-CM | POA: Diagnosis not present

## 2022-08-27 DIAGNOSIS — E1121 Type 2 diabetes mellitus with diabetic nephropathy: Secondary | ICD-10-CM | POA: Diagnosis not present

## 2022-08-27 LAB — TYPE AND SCREEN
ABO/RH(D): O POS
Antibody Screen: NEGATIVE
Unit division: 0
Unit division: 0

## 2022-08-27 LAB — CBC
HCT: 25.4 % — ABNORMAL LOW (ref 36.0–46.0)
Hemoglobin: 8.1 g/dL — ABNORMAL LOW (ref 12.0–15.0)
MCH: 30.1 pg (ref 26.0–34.0)
MCHC: 31.9 g/dL (ref 30.0–36.0)
MCV: 94.4 fL (ref 80.0–100.0)
Platelets: 231 10*3/uL (ref 150–400)
RBC: 2.69 MIL/uL — ABNORMAL LOW (ref 3.87–5.11)
RDW: 15.2 % (ref 11.5–15.5)
WBC: 5.4 10*3/uL (ref 4.0–10.5)
nRBC: 0 % (ref 0.0–0.2)

## 2022-08-27 LAB — BASIC METABOLIC PANEL
Anion gap: 17 — ABNORMAL HIGH (ref 5–15)
BUN: 53 mg/dL — ABNORMAL HIGH (ref 8–23)
CO2: 22 mmol/L (ref 22–32)
Calcium: 9.3 mg/dL (ref 8.9–10.3)
Chloride: 99 mmol/L (ref 98–111)
Creatinine, Ser: 2.29 mg/dL — ABNORMAL HIGH (ref 0.44–1.00)
GFR, Estimated: 20 mL/min — ABNORMAL LOW (ref >60)
Glucose, Bld: 113 mg/dL — ABNORMAL HIGH (ref 70–99)
Potassium: 3.5 mmol/L (ref 3.5–5.1)
Sodium: 138 mmol/L (ref 135–145)

## 2022-08-27 LAB — BPAM RBC
Blood Product Expiration Date: 202403092359
Blood Product Expiration Date: 202403092359
ISSUE DATE / TIME: 202402051930
Unit Type and Rh: 5100
Unit Type and Rh: 5100

## 2022-08-27 LAB — GLUCOSE, CAPILLARY: Glucose-Capillary: 116 mg/dL — ABNORMAL HIGH (ref 70–99)

## 2022-08-27 MED ORDER — PANTOPRAZOLE SODIUM 40 MG PO TBEC: 40.0000 mg | DELAYED_RELEASE_TABLET | Freq: Two times a day (BID) | ORAL | 1 refills | Status: DC

## 2022-08-27 MED ORDER — APIXABAN 2.5 MG PO TABS
2.5000 mg | ORAL_TABLET | Freq: Two times a day (BID) | ORAL | Status: DC
  Administered 2022-08-27: 2.5 mg via ORAL
  Filled 2022-08-27: qty 1

## 2022-08-27 MED ORDER — ACETAMINOPHEN 325 MG PO TABS: 650.0000 mg | ORAL_TABLET | Freq: Four times a day (QID) | ORAL | Status: DC | PRN

## 2022-08-27 NOTE — Progress Notes (Signed)
Report called to [facility].

## 2022-08-27 NOTE — Discharge Summary (Signed)
PATIENT DETAILS Name: Tabitha Black Age: 89 y.o. Sex: female Date of Birth: 07-Dec-1932 MRN: [id number]. Admitting Physician: Etta Quill, DO [id number], Geryl Rankins, MD  Admit Date: 08/23/2022 Discharge date: 08/27/2022  Recommendations for Outpatient Follow-up:  Follow up with PCP in 1-2 weeks Please obtain CMP/CBC in one week  Admitted From:  Home  Disposition: Skilled nursing facility   Discharge Condition: good  CODE STATUS:   Code Status: Full Code   Diet recommendation:  Diet Order             Diet - low sodium heart healthy           Diet Heart Fluid consistency: Thin; Fluid restriction: 1500 mL Fluid  Diet effective now                    Brief Summary: Patient is a 89 y.o.  female CKD 4, A-fib on Eliquis, HTN-who presented with melena and acute blood loss anemia.   Significant events: 2/5>> admit to [facility]-GI bleed with acute blood loss anemia.   Significant studies: None   Significant microbiology data: None   Procedures: 2/7>> EGD: Inflammation/shallow ulcerations in the gastric antrum   Consults: GI  Brief Hospital Course: Upper GI bleed with acute blood loss anemia EGD 2/7 with small gastric ulcers-likely the etiology of bleeding Required 1 unit of PRBC-hemoglobin stable since then Brown stools yesterday-melena has completely resolved. Biopsy negative for H. pylori/malignancy Continue PPI on discharge Per GI okay to resume Eliquis   Chronic atrial fibrillation Rate controlled Liquids held on admission-will resume on day of discharge Continue beta-blocker.     Chronic HFpEF Seems stable-lymphedema at baseline Continue diuretics   HTN BP stable-some amount of permissive hypertension allowed given GI bleeding Continue beta-blocker/Cardura Resume nifedipine on discharge    CKD stage IV At baseline Follow electrolytes periodically   Hypokalemia Replete/recheck   DM-2 (A1c 5.1 on 2/6) Managed with SSI during this  hospitalization Continue to follow in the outpatient setting closely-does not appear to be on any oral hypoglycemics.  B/L Lower ext Lymphedema Left leg wound-present prior to admit Chronic issue Appreciate wound care eval Per family-patient has a upcoming appointment at the wound care clinic in [place].  Denies to keep this appointment.   Obesity: Estimated body mass index is 35.78 kg/m as calculated from the following:   Height as of this encounter: 5' 3"$  (1.6 m).   Weight as of this encounter: 91.6 kg.    Discharge Diagnoses:  Principal Problem:   Gastrointestinal hemorrhage with melena Active Problems:   ABLA (acute blood loss anemia)   Chronic congestive heart failure (HCC)   Chronic kidney disease, stage 4 (severe) (HCC)   HTN (hypertension)   A-fib (HCC)   DM2 (diabetes mellitus, type 2) ([place])   Discharge Instructions:  Activity:  As tolerated with Full fall precautions use walker/cane & assistance as needed  Discharge Instructions     (HEART FAILURE PATIENTS) Call MD:  Anytime you have any of the following symptoms: 1) 3 pound weight gain in 24 hours or 5 pounds in 1 week 2) shortness of breath, with or without a dry hacking cough 3) swelling in the hands, feet or stomach 4) if you have to sleep on extra pillows at night in order to breathe.   Complete by: As directed    Call MD for:   Complete by: As directed    Dark or bloody stools.  Or if you vomit blood.  Call MD for:  difficulty breathing, headache or visual disturbances   Complete by: As directed    Diet - low sodium heart healthy   Complete by: As directed    Discharge instructions   Complete by: As directed    Follow with Primary MD  Collene Leyden, MD in 1-2 weeks  Please get a complete blood count and chemistry panel checked by your Primary MD at your next visit, and again as instructed by your Primary MD.  Get Medicines reviewed and adjusted: Please take all your medications with you for your  next visit with your Primary MD  Laboratory/radiological data: Please request your Primary MD to go over all hospital tests and procedure/radiological results at the follow up, please ask your Primary MD to get all Hospital records sent to his/her office.  In some cases, they will be blood work, cultures and biopsy results pending at the time of your discharge. Please request that your primary care M.D. follows up on these results.  Also Note the following: If you experience worsening of your admission symptoms, develop shortness of breath, life threatening emergency, suicidal or homicidal thoughts you must seek medical attention immediately by calling 911 or calling your MD immediately  if symptoms less severe.  You must read complete instructions/literature along with all the possible adverse reactions/side effects for all the Medicines you take and that have been prescribed to you. Take any new Medicines after you have completely understood and accpet all the possible adverse reactions/side effects.   Do not drive when taking Pain medications or sleeping medications (Benzodaizepines)  Do not take more than prescribed Pain, Sleep and Anxiety Medications. It is not advisable to combine anxiety,sleep and pain medications without talking with your primary care practitioner  Special Instructions: If you have smoked or chewed Tobacco  in the last 2 yrs please stop smoking, stop any regular Alcohol  and or any Recreational drug use.  Wear Seat belts while driving.  Please note: You were cared for by a hospitalist during your hospital stay. Once you are discharged, your primary care physician will handle any further medical issues. Please note that NO REFILLS for any discharge medications will be authorized once you are discharged, as it is imperative that you return to your primary care physician (or establish a relationship with a primary care physician if you do not have one) for your post  hospital discharge needs so that they can reassess your need for medications and monitor your lab values.   Discharge wound care:   Complete by: As directed    Wound care>>every other day Cleanse L lateral leg full thickness wound with NS, cut piece of Aquacel AG Kellie Simmering [phone]) to fit and place in wound bed  every other day, cover with foam dressing or Telfa non-adherent pad, wrap leg with Kerlix and Ace bandage from above the toes to just below knee.   Increase activity slowly   Complete by: As directed       Allergies as of 08/27/2022       Reactions   Other Swelling   Topical mycin, unsure of which one, caused opthalmic swelling Erythromycin    Penicillins Itching, Other (See Comments), Rash, Shortness Of Breath, Swelling   Lovastatin Other (See Comments)   Weakness and myalgias, maybe to statins?   Azithromycin    Patient doesn't remember having a reaction to this.   Clindamycin    Patient doesn't remember having a reaction to this.   Hydralazine  Other reaction(s): felt foggy   Lisinopril Other (See Comments)   Hyperkalemia   Neomycin Sulfate [neomycin] Other (See Comments)   Redness and burning in face   Niacin Hives   Facial swelling and redness   Rofecoxib Other (See Comments)   Unknown reaction   Tetracycline    Valsartan Other (See Comments)   Hyperkalemia   Vytorin [ezetimibe-simvastatin] Other (See Comments)   myalgias   Zetia [ezetimibe] Other (See Comments)   Leg pain        Medication List     STOP taking these medications    HYDROcodone-acetaminophen 5-325 MG tablet Commonly known as: NORCO/VICODIN   torsemide 20 MG tablet Commonly known as: DEMADEX       TAKE these medications    acetaminophen 325 MG tablet Commonly known as: TYLENOL Take 2 tablets (650 mg total) by mouth every 6 (six) hours as needed for mild pain (or Fever >/= 101).   apixaban 2.5 MG Tabs tablet Commonly known as: ELIQUIS Take 1 tablet (2.5 mg total) by mouth 2  (two) times daily.   cyclobenzaprine 10 MG tablet Commonly known as: FLEXERIL Take 5 mg by mouth daily as needed for muscle spasms.   doxazosin 4 MG tablet Commonly known as: CARDURA Take 4 mg by mouth daily.   fenofibrate 160 MG tablet Take 160 mg by mouth every evening.   furosemide 80 MG tablet Commonly known as: LASIX Take 80 mg by mouth 2 (two) times daily. AT 8AM AND 2PM   Fusion Plus Caps Take 1 capsule by mouth daily.   gemfibrozil 600 MG tablet Commonly known as: LOPID Take 600 mg by mouth daily.   Hair Skin and Nails Formula Tabs Take 1 tablet by mouth daily.   metoprolol succinate 100 MG 24 hr tablet Commonly known as: TOPROL-XL Take 100 mg by mouth every evening. Take with or immediately following a meal.   NIFEdipine 60 MG 24 hr tablet Commonly known as: PROCARDIA XL/NIFEDICAL XL Take 60 mg by mouth daily.   pantoprazole 40 MG tablet Commonly known as: Protonix Take 1 tablet (40 mg total) by mouth 2 (two) times daily. Take twice daily for 6 weeks, and then switch to once daily dosing   Vitamin D3 125 MCG (5000 UT) Tabs Take 5,000 Units by mouth daily.               Discharge Care Instructions  (From admission, onward)           Start     Ordered   08/27/22 0000  Discharge wound care:       Comments: Wound care>>every other day Cleanse L lateral leg full thickness wound with NS, cut piece of Aquacel AG Kellie Simmering [phone]) to fit and place in wound bed  every other day, cover with foam dressing or Telfa non-adherent pad, wrap leg with Kerlix and Ace bandage from above the toes to just below knee.   08/27/22 0809            Follow-up Information     Collene Leyden, MD. Schedule an appointment as soon as possible for a visit in 1 week(s).   Specialty: Family Medicine Contact information: [address] [place] [address] [phone]                Allergies  Allergen Reactions   Other Swelling    Topical  mycin, unsure of which one, caused opthalmic swelling  Erythromycin    Penicillins Itching, Other (  See Comments), Rash, Shortness Of Breath and Swelling   Lovastatin Other (See Comments)    Weakness and myalgias, maybe to statins?   Azithromycin     Patient doesn't remember having a reaction to this.   Clindamycin     Patient doesn't remember having a reaction to this.    Hydralazine     Other reaction(s): felt foggy   Lisinopril Other (See Comments)    Hyperkalemia    Neomycin Sulfate [Neomycin] Other (See Comments)    Redness and burning in face   Niacin Hives    Facial swelling and redness   Rofecoxib Other (See Comments)    Unknown reaction   Tetracycline    Valsartan Other (See Comments)    Hyperkalemia    Vytorin [Ezetimibe-Simvastatin] Other (See Comments)    myalgias   Zetia [Ezetimibe] Other (See Comments)    Leg pain     Other Procedures/Studies: No results found.   TODAY-DAY OF DISCHARGE:  Subjective:   Tabitha Black today has no headache,no chest abdominal pain,no new weakness tingling or numbness, feels much better wants to go home today.   Objective:   Blood pressure (!) 169/80, pulse 88, temperature 97.7 F (36.5 C), temperature source Oral, resp. rate 20, height 5' 3"$  (1.6 m), weight 91.6 kg, SpO2 97 %.  Intake/Output Summary (Last 24 hours) at 08/27/2022 0809 Last data filed at 08/27/2022 0248 Gross per 24 hour  Intake 180 ml  Output 1575 ml  Net -1395 ml   Filed Weights   08/23/22 1735  Weight: 91.6 kg    Exam: Awake Alert, Oriented *3, No new F.N deficits, Normal affect [place].AT,PERRAL Supple Neck,No JVD, No cervical lymphadenopathy appriciated.  Symmetrical Chest wall movement, Good air movement bilaterally, CTAB RRR,No Gallops,Rubs or new Murmurs, No Parasternal Heave +ve B.Sounds, Abd Soft, Non tender, No organomegaly appriciated, No rebound -guarding or rigidity. No Cyanosis, Clubbing or edema, No new Rash or bruise   PERTINENT  RADIOLOGIC STUDIES: No results found.   PERTINENT LAB RESULTS: CBC: Recent Labs    08/26/22 1646 08/27/22 0648  WBC 5.1 5.4  HGB 7.9* 8.1*  HCT 24.5* 25.4*  PLT 221 231   CMET CMP     Component Value Date/Time   NA 138 08/27/2022 0648   NA 141 06/08/2022 1619   K 3.5 08/27/2022 0648   CL 99 08/27/2022 0648   CO2 22 08/27/2022 0648   GLUCOSE 113 (H) 08/27/2022 0648   BUN 53 (H) 08/27/2022 0648   BUN 44 (H) 06/08/2022 1619   CREATININE 2.29 (H) 08/27/2022 0648   CALCIUM 9.3 08/27/2022 0648   PROT 6.5 08/23/2022 1803   ALBUMIN 3.2 (L) 08/23/2022 1803   AST 27 08/23/2022 1803   ALT 14 08/23/2022 1803   ALKPHOS 69 08/23/2022 1803   BILITOT 1.1 08/23/2022 1803   GFRNONAA 20 (L) 08/27/2022 0648   GFRAA 22 (L) 07/25/2020 1126    GFR Estimated Creatinine Clearance: 18.6 mL/min (A) (by C-G formula based on SCr of 2.29 mg/dL (H)). No results for input(s): "LIPASE", "AMYLASE" in the last 72 hours. No results for input(s): "CKTOTAL", "CKMB", "CKMBINDEX", "TROPONINI" in the last 72 hours. Invalid input(s): "POCBNP" No results for input(s): "DDIMER" in the last 72 hours. No results for input(s): "HGBA1C" in the last 72 hours. No results for input(s): "CHOL", "HDL", "LDLCALC", "TRIG", "CHOLHDL", "LDLDIRECT" in the last 72 hours. No results for input(s): "TSH", "T4TOTAL", "T3FREE", "THYROIDAB" in the last 72 hours.  Invalid input(s): "FREET3" No results for  input(s): "VITAMINB12", "FOLATE", "FERRITIN", "TIBC", "IRON", "RETICCTPCT" in the last 72 hours. Coags: No results for input(s): "INR" in the last 72 hours.  Invalid input(s): "PT" Microbiology: Recent Results (from the past 240 hour(s))  MRSA Next Gen by PCR, Nasal     Status: None   Collection Time: 08/24/22 11:19 AM   Specimen: Nasal Mucosa; Nasal Swab  Result Value Ref Range Status   MRSA by PCR Next Gen NOT DETECTED NOT DETECTED Final    Comment: (NOTE) The GeneXpert MRSA Assay (FDA approved for NASAL specimens  only), is one component of a comprehensive MRSA colonization surveillance program. It is not intended to diagnose MRSA infection nor to guide or monitor treatment for MRSA infections. Test performance is not FDA approved in patients less than 7 years old. Performed at [facility], [place] [address] [place], [place] [address]     FURTHER DISCHARGE INSTRUCTIONS:  Get Medicines reviewed and adjusted: Please take all your medications with you for your next visit with your Primary MD  Laboratory/radiological data: Please request your Primary MD to go over all hospital tests and procedure/radiological results at the follow up, please ask your Primary MD to get all Hospital records sent to his/her office.  In some cases, they will be blood work, cultures and biopsy results pending at the time of your discharge. Please request that your primary care M.D. goes through all the records of your hospital data and follows up on these results.  Also Note the following: If you experience worsening of your admission symptoms, develop shortness of breath, life threatening emergency, suicidal or homicidal thoughts you must seek medical attention immediately by calling 911 or calling your MD immediately  if symptoms less severe.  You must read complete instructions/literature along with all the possible adverse reactions/side effects for all the Medicines you take and that have been prescribed to you. Take any new Medicines after you have completely understood and accpet all the possible adverse reactions/side effects.   Do not drive when taking Pain medications or sleeping medications (Benzodaizepines)  Do not take more than prescribed Pain, Sleep and Anxiety Medications. It is not advisable to combine anxiety,sleep and pain medications without talking with your primary care practitioner  Special Instructions: If you have smoked or chewed Tobacco  in the last 2 yrs please stop smoking, stop any  regular Alcohol  and or any Recreational drug use.  Wear Seat belts while driving.  Please note: You were cared for by a hospitalist during your hospital stay. Once you are discharged, your primary care physician will handle any further medical issues. Please note that NO REFILLS for any discharge medications will be authorized once you are discharged, as it is imperative that you return to your primary care physician (or establish a relationship with a primary care physician if you do not have one) for your post hospital discharge needs so that they can reassess your need for medications and monitor your lab values.  Total Time spent coordinating discharge including counseling, education and face to face time equals greater than 30 minutes.  SignedOren Binet 08/27/2022 8:09 AM

## 2022-08-27 NOTE — TOC Transition Note (Signed)
Transition of Care [facility]) - CM/SW Discharge Note   Patient Details  Name: Tabitha Black MRN: [id number] Date of Birth: 1933/01/24  Transition [facility]) CM/SW Contact:  Bjorn Pippin, LCSW Phone Number: 08/27/2022, 10:03 AM   Clinical Narrative:    Patient will DC to: [facility]  Anticipated DC date: 08/27/2022 Family notified: Renea (daughter) Transport by: Corey Harold   Per MD patient ready for DC to [facility]. RN, patient, patient's family, and facility notified of DC. Discharge Summary and FL2 sent to facility. RN to call report prior to discharge [phone]. DC packet on chart. Ambulance transport requested for patient.   CSW will sign off for now as social work intervention is no longer needed. Please consult [place] again if new needs arise.    Final next level of care: Skilled Nursing Facility Barriers to Discharge: No Barriers Identified   Patient Goals and CMS Choice      Discharge Placement                Patient chooses bed at: [facility] Patient to be transferred to facility by: [facility] Name of family member notified: Daughter Renea Patient and family notified of of transfer: 08/27/22  Discharge Plan and Services Additional resources added to the After Visit Summary for     Discharge Planning Services: CM Consult                                 Social Determinants of Health (SDOH) Interventions SDOH Screenings   Food Insecurity: No Food Insecurity (08/24/2022)  Housing: Low Risk  (08/24/2022)  Transportation Needs: No Transportation Needs (08/24/2022)  Utilities: Not At Risk (08/24/2022)  Tobacco Use: Low Risk  (08/25/2022)     Readmission Risk Interventions     No data to display          Beckey Rutter, MSW, LCSWA, LCASA Transitions of Care  Clinical Social Worker I

## 2022-08-27 NOTE — Progress Notes (Signed)
PIV's removed. Discharge instructions completed. Patient verbalized understanding of medication regimen, follow up appointments and discharge instructions. Patient belongings gathered and packed to discharge.

## 2022-08-28 ENCOUNTER — Encounter (HOSPITAL_COMMUNITY): Payer: Self-pay | Admitting: Gastroenterology

## 2022-08-29 DIAGNOSIS — L97929 Non-pressure chronic ulcer of unspecified part of left lower leg with unspecified severity: Secondary | ICD-10-CM | POA: Diagnosis not present

## 2022-08-29 DIAGNOSIS — E785 Hyperlipidemia, unspecified: Secondary | ICD-10-CM | POA: Diagnosis not present

## 2022-08-29 DIAGNOSIS — N184 Chronic kidney disease, stage 4 (severe): Secondary | ICD-10-CM | POA: Diagnosis not present

## 2022-08-29 DIAGNOSIS — K254 Chronic or unspecified gastric ulcer with hemorrhage: Secondary | ICD-10-CM | POA: Diagnosis not present

## 2022-08-29 DIAGNOSIS — I83029 Varicose veins of left lower extremity with ulcer of unspecified site: Secondary | ICD-10-CM | POA: Diagnosis not present

## 2022-08-29 DIAGNOSIS — I872 Venous insufficiency (chronic) (peripheral): Secondary | ICD-10-CM | POA: Diagnosis not present

## 2022-08-29 DIAGNOSIS — D649 Anemia, unspecified: Secondary | ICD-10-CM | POA: Diagnosis not present

## 2022-09-01 ENCOUNTER — Other Ambulatory Visit: Payer: Self-pay | Admitting: *Deleted

## 2022-09-01 NOTE — Patient Outreach (Signed)
Tabitha Black recently admitted to [name]. Screening for care coordination services as benefit of health plan and Primary Care Provider.   Collaboration with [name], [name], museum. Anticipated transition plan is home.   PCP office [facility] has Upstream care management.  Will continue to follow.   [name], MSN, RN,BSN [place] Acute Care Coordinator [phone] (Direct dial)

## 2022-09-07 ENCOUNTER — Other Ambulatory Visit (HOSPITAL_COMMUNITY): Payer: Self-pay | Admitting: *Deleted

## 2022-09-07 ENCOUNTER — Encounter (HOSPITAL_COMMUNITY): Payer: Medicare Other

## 2022-09-08 ENCOUNTER — Encounter (HOSPITAL_COMMUNITY)
Admission: RE | Admit: 2022-09-08 | Discharge: 2022-09-08 | Disposition: A | Discharge status: Home / Self Care | Payer: Medicare Other | Source: Ambulatory Visit | Attending: Nephrology | Admitting: Nephrology

## 2022-09-08 VITALS — BP 120/47 | HR 56 | Temp 97.0°F | Resp 17 | Wt 202.0 lb

## 2022-09-08 DIAGNOSIS — N184 Chronic kidney disease, stage 4 (severe): Secondary | ICD-10-CM | POA: Insufficient documentation

## 2022-09-08 LAB — POCT HEMOGLOBIN-HEMACUE: Hemoglobin: 9.2 g/dL — ABNORMAL LOW (ref 12.0–15.0)

## 2022-09-08 MED ORDER — SODIUM CHLORIDE 0.9 % IV SOLN
510.0000 mg | INTRAVENOUS | Status: DC
  Administered 2022-09-08: 510 mg via INTRAVENOUS
  Filled 2022-09-08: qty 17

## 2022-09-08 MED ORDER — EPOETIN ALFA-EPBX 10000 UNIT/ML IJ SOLN
10000.0000 [IU] | INTRAMUSCULAR | Status: DC
  Administered 2022-09-08: 10000 [IU] via SUBCUTANEOUS

## 2022-09-08 MED ORDER — EPOETIN ALFA-EPBX 10000 UNIT/ML IJ SOLN
INTRAMUSCULAR | Status: AC
  Filled 2022-09-08: qty 1

## 2022-09-12 DIAGNOSIS — R630 Anorexia: Secondary | ICD-10-CM | POA: Diagnosis not present

## 2022-09-12 DIAGNOSIS — G4700 Insomnia, unspecified: Secondary | ICD-10-CM | POA: Diagnosis not present

## 2022-09-12 DIAGNOSIS — N19 Unspecified kidney failure: Secondary | ICD-10-CM | POA: Diagnosis not present

## 2022-09-15 ENCOUNTER — Ambulatory Visit: Payer: Medicare Other | Admitting: Cardiovascular Disease

## 2022-09-16 ENCOUNTER — Encounter (HOSPITAL_COMMUNITY): Payer: Medicare Other

## 2022-09-22 ENCOUNTER — Encounter (HOSPITAL_COMMUNITY)
Admission: RE | Admit: 2022-09-22 | Discharge: 2022-09-22 | Disposition: A | Discharge status: Home / Self Care | Payer: Medicare Other | Source: Ambulatory Visit | Attending: Nephrology | Admitting: Nephrology

## 2022-09-22 VITALS — BP 136/45 | HR 70 | Temp 97.3°F | Resp 17

## 2022-09-22 DIAGNOSIS — N184 Chronic kidney disease, stage 4 (severe): Secondary | ICD-10-CM | POA: Diagnosis not present

## 2022-09-22 LAB — POCT HEMOGLOBIN-HEMACUE: Hemoglobin: 8.9 g/dL — ABNORMAL LOW (ref 12.0–15.0)

## 2022-09-22 MED ORDER — EPOETIN ALFA-EPBX 10000 UNIT/ML IJ SOLN
INTRAMUSCULAR | Status: AC
  Filled 2022-09-22: qty 1

## 2022-09-22 MED ORDER — EPOETIN ALFA-EPBX 10000 UNIT/ML IJ SOLN
10000.0000 [IU] | INTRAMUSCULAR | Status: DC
  Administered 2022-09-22: 10000 [IU] via SUBCUTANEOUS

## 2022-09-22 MED ORDER — SODIUM CHLORIDE 0.9 % IV SOLN
510.0000 mg | INTRAVENOUS | Status: DC
  Administered 2022-09-22: 510 mg via INTRAVENOUS
  Filled 2022-09-22: qty 510

## 2022-09-23 ENCOUNTER — Other Ambulatory Visit: Payer: Self-pay | Admitting: *Deleted

## 2022-09-23 DIAGNOSIS — Z6835 Body mass index (BMI) 35.0-35.9, adult: Secondary | ICD-10-CM | POA: Diagnosis not present

## 2022-09-23 DIAGNOSIS — M1990 Unspecified osteoarthritis, unspecified site: Secondary | ICD-10-CM | POA: Diagnosis not present

## 2022-09-23 DIAGNOSIS — I482 Chronic atrial fibrillation, unspecified: Secondary | ICD-10-CM | POA: Diagnosis not present

## 2022-09-23 DIAGNOSIS — E1122 Type 2 diabetes mellitus with diabetic chronic kidney disease: Secondary | ICD-10-CM | POA: Diagnosis not present

## 2022-09-23 DIAGNOSIS — E1151 Type 2 diabetes mellitus with diabetic peripheral angiopathy without gangrene: Secondary | ICD-10-CM | POA: Diagnosis not present

## 2022-09-23 DIAGNOSIS — D631 Anemia in chronic kidney disease: Secondary | ICD-10-CM | POA: Diagnosis not present

## 2022-09-23 DIAGNOSIS — I130 Hypertensive heart and chronic kidney disease with heart failure and stage 1 through stage 4 chronic kidney disease, or unspecified chronic kidney disease: Secondary | ICD-10-CM | POA: Diagnosis not present

## 2022-09-23 DIAGNOSIS — N184 Chronic kidney disease, stage 4 (severe): Secondary | ICD-10-CM | POA: Diagnosis not present

## 2022-09-23 DIAGNOSIS — I5032 Chronic diastolic (congestive) heart failure: Secondary | ICD-10-CM | POA: Diagnosis not present

## 2022-09-23 DIAGNOSIS — D62 Acute posthemorrhagic anemia: Secondary | ICD-10-CM | POA: Diagnosis not present

## 2022-09-23 DIAGNOSIS — K259 Gastric ulcer, unspecified as acute or chronic, without hemorrhage or perforation: Secondary | ICD-10-CM | POA: Diagnosis not present

## 2022-09-23 DIAGNOSIS — E559 Vitamin D deficiency, unspecified: Secondary | ICD-10-CM | POA: Diagnosis not present

## 2022-09-23 DIAGNOSIS — K219 Gastro-esophageal reflux disease without esophagitis: Secondary | ICD-10-CM | POA: Diagnosis not present

## 2022-09-23 DIAGNOSIS — E669 Obesity, unspecified: Secondary | ICD-10-CM | POA: Diagnosis not present

## 2022-09-23 DIAGNOSIS — M545 Low back pain, unspecified: Secondary | ICD-10-CM | POA: Diagnosis not present

## 2022-09-23 DIAGNOSIS — I872 Venous insufficiency (chronic) (peripheral): Secondary | ICD-10-CM | POA: Diagnosis not present

## 2022-09-23 DIAGNOSIS — Z90710 Acquired absence of both cervix and uterus: Secondary | ICD-10-CM | POA: Diagnosis not present

## 2022-09-23 DIAGNOSIS — E7849 Other hyperlipidemia: Secondary | ICD-10-CM | POA: Diagnosis not present

## 2022-09-23 DIAGNOSIS — G8929 Other chronic pain: Secondary | ICD-10-CM | POA: Diagnosis not present

## 2022-09-23 DIAGNOSIS — H9193 Unspecified hearing loss, bilateral: Secondary | ICD-10-CM | POA: Diagnosis not present

## 2022-09-23 DIAGNOSIS — Z7901 Long term (current) use of anticoagulants: Secondary | ICD-10-CM | POA: Diagnosis not present

## 2022-09-23 DIAGNOSIS — K922 Gastrointestinal hemorrhage, unspecified: Secondary | ICD-10-CM | POA: Diagnosis not present

## 2022-09-23 DIAGNOSIS — Z9049 Acquired absence of other specified parts of digestive tract: Secondary | ICD-10-CM | POA: Diagnosis not present

## 2022-09-23 DIAGNOSIS — I890 Lymphedema, not elsewhere classified: Secondary | ICD-10-CM | POA: Diagnosis not present

## 2022-09-23 DIAGNOSIS — E876 Hypokalemia: Secondary | ICD-10-CM | POA: Diagnosis not present

## 2022-09-23 NOTE — Patient Outreach (Signed)
Long Coordinator follow up. Verified in [facility] Mrs. Steffensmeier discharged from [facility] on 09/22/22.  Confirmed with Margot Chimes [facility] social worker Mrs. Roberta will have [facility] for PT/OT/nursing.   Will send notification to [facility] care management.   [facility] has Upstream care management.  [name], MSN, RN,BSN [place] Acute Care Coordinator [phone] (Direct dial)

## 2022-09-28 DIAGNOSIS — K922 Gastrointestinal hemorrhage, unspecified: Secondary | ICD-10-CM | POA: Diagnosis not present

## 2022-09-28 DIAGNOSIS — D62 Acute posthemorrhagic anemia: Secondary | ICD-10-CM | POA: Diagnosis not present

## 2022-09-28 DIAGNOSIS — I130 Hypertensive heart and chronic kidney disease with heart failure and stage 1 through stage 4 chronic kidney disease, or unspecified chronic kidney disease: Secondary | ICD-10-CM | POA: Diagnosis not present

## 2022-09-28 DIAGNOSIS — E1122 Type 2 diabetes mellitus with diabetic chronic kidney disease: Secondary | ICD-10-CM | POA: Diagnosis not present

## 2022-09-28 DIAGNOSIS — I5032 Chronic diastolic (congestive) heart failure: Secondary | ICD-10-CM | POA: Diagnosis not present

## 2022-09-28 DIAGNOSIS — N184 Chronic kidney disease, stage 4 (severe): Secondary | ICD-10-CM | POA: Diagnosis not present

## 2022-10-01 DIAGNOSIS — K922 Gastrointestinal hemorrhage, unspecified: Secondary | ICD-10-CM | POA: Diagnosis not present

## 2022-10-01 DIAGNOSIS — N184 Chronic kidney disease, stage 4 (severe): Secondary | ICD-10-CM | POA: Diagnosis not present

## 2022-10-01 DIAGNOSIS — E1122 Type 2 diabetes mellitus with diabetic chronic kidney disease: Secondary | ICD-10-CM | POA: Diagnosis not present

## 2022-10-01 DIAGNOSIS — I130 Hypertensive heart and chronic kidney disease with heart failure and stage 1 through stage 4 chronic kidney disease, or unspecified chronic kidney disease: Secondary | ICD-10-CM | POA: Diagnosis not present

## 2022-10-01 DIAGNOSIS — I5032 Chronic diastolic (congestive) heart failure: Secondary | ICD-10-CM | POA: Diagnosis not present

## 2022-10-01 DIAGNOSIS — D62 Acute posthemorrhagic anemia: Secondary | ICD-10-CM | POA: Diagnosis not present

## 2022-10-03 ENCOUNTER — Inpatient Hospital Stay (HOSPITAL_COMMUNITY)
Admission: EM | Admit: 2022-10-03 | Discharge: 2022-10-06 | DRG: 194 | Disposition: A | Discharge status: Home Health | Payer: Medicare Other | Attending: Family Medicine | Admitting: Family Medicine

## 2022-10-03 ENCOUNTER — Other Ambulatory Visit: Payer: Self-pay

## 2022-10-03 ENCOUNTER — Encounter (HOSPITAL_COMMUNITY): Payer: Self-pay

## 2022-10-03 ENCOUNTER — Emergency Department (HOSPITAL_COMMUNITY): Payer: Medicare Other

## 2022-10-03 DIAGNOSIS — I5042 Chronic combined systolic (congestive) and diastolic (congestive) heart failure: Secondary | ICD-10-CM | POA: Diagnosis present

## 2022-10-03 DIAGNOSIS — B338 Other specified viral diseases: Secondary | ICD-10-CM | POA: Diagnosis present

## 2022-10-03 DIAGNOSIS — I890 Lymphedema, not elsewhere classified: Secondary | ICD-10-CM | POA: Diagnosis not present

## 2022-10-03 DIAGNOSIS — I4821 Permanent atrial fibrillation: Secondary | ICD-10-CM | POA: Diagnosis not present

## 2022-10-03 DIAGNOSIS — E7800 Pure hypercholesterolemia, unspecified: Secondary | ICD-10-CM | POA: Diagnosis present

## 2022-10-03 DIAGNOSIS — R0602 Shortness of breath: Secondary | ICD-10-CM | POA: Diagnosis not present

## 2022-10-03 DIAGNOSIS — N185 Chronic kidney disease, stage 5: Secondary | ICD-10-CM | POA: Diagnosis present

## 2022-10-03 DIAGNOSIS — Z7901 Long term (current) use of anticoagulants: Secondary | ICD-10-CM

## 2022-10-03 DIAGNOSIS — Z79899 Other long term (current) drug therapy: Secondary | ICD-10-CM

## 2022-10-03 DIAGNOSIS — Z634 Disappearance and death of family member: Secondary | ICD-10-CM

## 2022-10-03 DIAGNOSIS — Z803 Family history of malignant neoplasm of breast: Secondary | ICD-10-CM

## 2022-10-03 DIAGNOSIS — R531 Weakness: Secondary | ICD-10-CM | POA: Diagnosis not present

## 2022-10-03 DIAGNOSIS — Z85828 Personal history of other malignant neoplasm of skin: Secondary | ICD-10-CM

## 2022-10-03 DIAGNOSIS — R000 Tachycardia, unspecified: Secondary | ICD-10-CM | POA: Diagnosis present

## 2022-10-03 DIAGNOSIS — Z8042 Family history of malignant neoplasm of prostate: Secondary | ICD-10-CM

## 2022-10-03 DIAGNOSIS — J9811 Atelectasis: Secondary | ICD-10-CM | POA: Diagnosis present

## 2022-10-03 DIAGNOSIS — I10 Essential (primary) hypertension: Secondary | ICD-10-CM | POA: Diagnosis not present

## 2022-10-03 DIAGNOSIS — Z1152 Encounter for screening for COVID-19: Secondary | ICD-10-CM | POA: Diagnosis not present

## 2022-10-03 DIAGNOSIS — Z881 Allergy status to other antibiotic agents status: Secondary | ICD-10-CM

## 2022-10-03 DIAGNOSIS — E1122 Type 2 diabetes mellitus with diabetic chronic kidney disease: Secondary | ICD-10-CM | POA: Diagnosis not present

## 2022-10-03 DIAGNOSIS — I5032 Chronic diastolic (congestive) heart failure: Secondary | ICD-10-CM | POA: Diagnosis present

## 2022-10-03 DIAGNOSIS — I959 Hypotension, unspecified: Secondary | ICD-10-CM | POA: Diagnosis not present

## 2022-10-03 DIAGNOSIS — J121 Respiratory syncytial virus pneumonia: Secondary | ICD-10-CM | POA: Diagnosis present

## 2022-10-03 DIAGNOSIS — Z6835 Body mass index (BMI) 35.0-35.9, adult: Secondary | ICD-10-CM

## 2022-10-03 DIAGNOSIS — D631 Anemia in chronic kidney disease: Secondary | ICD-10-CM | POA: Diagnosis present

## 2022-10-03 DIAGNOSIS — I4891 Unspecified atrial fibrillation: Secondary | ICD-10-CM | POA: Diagnosis not present

## 2022-10-03 DIAGNOSIS — Z888 Allergy status to other drugs, medicaments and biological substances status: Secondary | ICD-10-CM | POA: Diagnosis not present

## 2022-10-03 DIAGNOSIS — L8990 Pressure ulcer of unspecified site, unspecified stage: Secondary | ICD-10-CM | POA: Insufficient documentation

## 2022-10-03 DIAGNOSIS — Z8249 Family history of ischemic heart disease and other diseases of the circulatory system: Secondary | ICD-10-CM | POA: Diagnosis not present

## 2022-10-03 DIAGNOSIS — R609 Edema, unspecified: Secondary | ICD-10-CM | POA: Diagnosis not present

## 2022-10-03 DIAGNOSIS — Z90710 Acquired absence of both cervix and uterus: Secondary | ICD-10-CM | POA: Diagnosis not present

## 2022-10-03 DIAGNOSIS — I4819 Other persistent atrial fibrillation: Secondary | ICD-10-CM | POA: Diagnosis present

## 2022-10-03 DIAGNOSIS — I130 Hypertensive heart and chronic kidney disease with heart failure and stage 1 through stage 4 chronic kidney disease, or unspecified chronic kidney disease: Secondary | ICD-10-CM | POA: Diagnosis present

## 2022-10-03 DIAGNOSIS — Z9049 Acquired absence of other specified parts of digestive tract: Secondary | ICD-10-CM

## 2022-10-03 DIAGNOSIS — Z880 Allergy status to penicillin: Secondary | ICD-10-CM

## 2022-10-03 DIAGNOSIS — E119 Type 2 diabetes mellitus without complications: Secondary | ICD-10-CM

## 2022-10-03 DIAGNOSIS — R509 Fever, unspecified: Secondary | ICD-10-CM | POA: Diagnosis not present

## 2022-10-03 DIAGNOSIS — J210 Acute bronchiolitis due to respiratory syncytial virus: Principal | ICD-10-CM

## 2022-10-03 DIAGNOSIS — Z96651 Presence of right artificial knee joint: Secondary | ICD-10-CM | POA: Diagnosis present

## 2022-10-03 DIAGNOSIS — Z515 Encounter for palliative care: Secondary | ICD-10-CM | POA: Diagnosis not present

## 2022-10-03 DIAGNOSIS — K2970 Gastritis, unspecified, without bleeding: Secondary | ICD-10-CM | POA: Diagnosis present

## 2022-10-03 DIAGNOSIS — N184 Chronic kidney disease, stage 4 (severe): Secondary | ICD-10-CM | POA: Diagnosis not present

## 2022-10-03 DIAGNOSIS — D500 Iron deficiency anemia secondary to blood loss (chronic): Secondary | ICD-10-CM | POA: Diagnosis present

## 2022-10-03 DIAGNOSIS — I5030 Unspecified diastolic (congestive) heart failure: Secondary | ICD-10-CM | POA: Diagnosis present

## 2022-10-03 DIAGNOSIS — R54 Age-related physical debility: Secondary | ICD-10-CM | POA: Diagnosis present

## 2022-10-03 LAB — CBC WITH DIFFERENTIAL/PLATELET
Abs Immature Granulocytes: 0.08 10*3/uL — ABNORMAL HIGH (ref 0.00–0.07)
Basophils Absolute: 0 10*3/uL (ref 0.0–0.1)
Basophils Relative: 0 %
Eosinophils Absolute: 0 10*3/uL (ref 0.0–0.5)
Eosinophils Relative: 0 %
HCT: 32.9 % — ABNORMAL LOW (ref 36.0–46.0)
Hemoglobin: 10.2 g/dL — ABNORMAL LOW (ref 12.0–15.0)
Immature Granulocytes: 1 %
Lymphocytes Relative: 9 %
Lymphs Abs: 0.7 10*3/uL (ref 0.7–4.0)
MCH: 29.7 pg (ref 26.0–34.0)
MCHC: 31 g/dL (ref 30.0–36.0)
MCV: 95.6 fL (ref 80.0–100.0)
Monocytes Absolute: 0.6 10*3/uL (ref 0.1–1.0)
Monocytes Relative: 7 %
Neutro Abs: 6.7 10*3/uL (ref 1.7–7.7)
Neutrophils Relative %: 83 %
Platelets: 232 10*3/uL (ref 150–400)
RBC: 3.44 MIL/uL — ABNORMAL LOW (ref 3.87–5.11)
RDW: 16.1 % — ABNORMAL HIGH (ref 11.5–15.5)
WBC: 8.1 10*3/uL (ref 4.0–10.5)
nRBC: 0 % (ref 0.0–0.2)

## 2022-10-03 LAB — COMPREHENSIVE METABOLIC PANEL
ALT: 21 U/L (ref 0–44)
AST: 75 U/L — ABNORMAL HIGH (ref 15–41)
Albumin: 3.6 g/dL (ref 3.5–5.0)
Alkaline Phosphatase: 51 U/L (ref 38–126)
Anion gap: 11 (ref 5–15)
BUN: 51 mg/dL — ABNORMAL HIGH (ref 8–23)
CO2: 22 mmol/L (ref 22–32)
Calcium: 8.9 mg/dL (ref 8.9–10.3)
Chloride: 103 mmol/L (ref 98–111)
Creatinine, Ser: 2.9 mg/dL — ABNORMAL HIGH (ref 0.44–1.00)
GFR, Estimated: 15 mL/min — ABNORMAL LOW (ref >60)
Glucose, Bld: 134 mg/dL — ABNORMAL HIGH (ref 70–99)
Potassium: 3.5 mmol/L (ref 3.5–5.1)
Sodium: 136 mmol/L (ref 135–145)
Total Bilirubin: 1.8 mg/dL — ABNORMAL HIGH (ref 0.3–1.2)
Total Protein: 7.3 g/dL (ref 6.5–8.1)

## 2022-10-03 LAB — URINALYSIS, W/ REFLEX TO CULTURE (INFECTION SUSPECTED)
Bacteria, UA: NONE SEEN
Bilirubin Urine: NEGATIVE
Glucose, UA: NEGATIVE mg/dL
Hgb urine dipstick: NEGATIVE
Ketones, ur: NEGATIVE mg/dL
Leukocytes,Ua: NEGATIVE
Nitrite: NEGATIVE
Protein, ur: 30 mg/dL — AB
Specific Gravity, Urine: 1.011 (ref 1.005–1.030)
pH: 5 (ref 5.0–8.0)

## 2022-10-03 LAB — LACTIC ACID, PLASMA: Lactic Acid, Venous: 1.4 mmol/L (ref 0.5–1.9)

## 2022-10-03 NOTE — ED Provider Notes (Signed)
[facility] Emergency Department Provider Note MRN:  [id number]  Arrival date & time: 10/04/22     Chief Complaint   Leg Swelling and Weakness   History of Present Illness   Tabitha Black is a 89 y.o. year-old female presents to the ED with chief complaint of cough and generalized weakness.  She is from home, but was recently returned from rehab after admission for GI bleed.  Patient reports worsening cough.  States that she isn't having any pain other than in her legs bilaterally (from leg wraps).  On Eliquis for a-fib.  History provided by patient.   Review of Systems  Pertinent positive and negative review of systems noted in HPI.    Physical Exam   Vitals:   10/03/22 2345 10/04/22 0000  BP: (!) 140/50 (!) 140/66  Pulse: 65 80  Resp: 16 20  Temp:    SpO2: 97% 97%    CONSTITUTIONAL:  elderly, non-toxic-appearing, NAD NEURO:  Alert and oriented x 3, CN 3-12 grossly intact EYES:  eyes equal and reactive ENT/NECK:  Supple, no stridor  CARDIO:  normal rate, regular rhythm, appears well-perfused  PULM:  No respiratory distress, coarse crackles bilaterally, coarse coughing during exam GI/GU:  non-distended, non tender MSK/SPINE:  No gross deformities, significant lower extremity edema bilaterally SKIN:  no rash, atraumatic   *Additional and/or pertinent findings included in MDM below  Diagnostic and Interventional Summary    EKG Interpretation  Date/Time:  Sunday October 03 2022 22:26:15 EDT Ventricular Rate:  89 PR Interval:    QRS Duration: 105 QT Interval:  499 QTC Calculation: 608 R Axis:   81 Text Interpretation: Atrial fibrillation LVH with secondary repolarization abnormality Non-specific ST-t changes Confirmed by Lajean Saver [phone]) on 10/03/2022 10:28:14 PM       Labs Reviewed  RESP PANEL BY RT-PCR (RSV, FLU A&B, COVID)  RVPGX2 - Abnormal; Notable for the following components:      Result Value   Resp Syncytial Virus by PCR  POSITIVE (*)    All other components within normal limits  COMPREHENSIVE METABOLIC PANEL - Abnormal; Notable for the following components:   Glucose, Bld 134 (*)    BUN 51 (*)    Creatinine, Ser 2.90 (*)    AST 75 (*)    Total Bilirubin 1.8 (*)    GFR, Estimated 15 (*)    All other components within normal limits  CBC WITH DIFFERENTIAL/PLATELET - Abnormal; Notable for the following components:   RBC 3.44 (*)    Hemoglobin 10.2 (*)    HCT 32.9 (*)    RDW 16.1 (*)    Abs Immature Granulocytes 0.08 (*)    All other components within normal limits  PROTIME-INR - Abnormal; Notable for the following components:   Prothrombin Time 17.0 (*)    INR 1.4 (*)    All other components within normal limits  BRAIN NATRIURETIC PEPTIDE - Abnormal; Notable for the following components:   B Natriuretic Peptide 472.6 (*)    All other components within normal limits  URINALYSIS, W/ REFLEX TO CULTURE (INFECTION SUSPECTED) - Abnormal; Notable for the following components:   Protein, ur 30 (*)    All other components within normal limits  CULTURE, BLOOD (ROUTINE X 2)  CULTURE, BLOOD (ROUTINE X 2)  LACTIC ACID, PLASMA  APTT    DG Chest Port 1 View  Final Result      Medications - No data to display   Procedures  /  Critical  Care Procedures  ED Course and Medical Decision Making  I have reviewed the triage vital signs, the nursing notes, and pertinent available records from the EMR.  Social Determinants Affecting Complexity of Care: Patient has no clinically significant social determinants affecting this chief complaint..   ED Course:    Medical Decision Making Patient here with SOB.  Crackles diffusely.  Coarse cough.  Will check labs and imaging.  Amount and/or Complexity of Data Reviewed Labs: ordered.    Details: RSV positive No leukocytosis BNP 472 Radiology: ordered and independent interpretation performed.    Details: cardiomegaly ECG/medicine tests:  ordered.  Risk Decision regarding hospitalization.     Consultants: I discussed the case with Hospitalist, Dr. Bridgett Larsson, who is appreciated for admitting.   Treatment and Plan: Patient's exam and diagnostic results are concerning for RSV pneumonia.  Feel that patient will need admission to the hospital for further treatment and evaluation.    Final Clinical Impressions(s) / ED Diagnoses     ICD-10-CM   1. RSV (acute bronchiolitis due to respiratory syncytial virus)  J21.0       ED Discharge Orders     None         Discharge Instructions Discussed with and Provided to Patient:   Discharge Instructions   None      Montine Circle, PA-C 10/04/22 0131    Lajean Saver, MD 10/04/22 1732

## 2022-10-03 NOTE — ED Triage Notes (Addendum)
Has bilaterally lower extremity weakness with weeping and blisters noted. Was at rehab recently for this same complaint. But states that they look much worse now.  When she was discharged from a rehab facility she started to have a cough, has been treated for it but it has come back     EMS states that she had low oxygen when they arrived, she was placed on 2L    625 tylenol given 800 LR given  Crackles Left lower, diminished left upper lobe  160-CBG 90 RA 20 LAC

## 2022-10-04 ENCOUNTER — Encounter (HOSPITAL_COMMUNITY): Payer: Self-pay | Admitting: Internal Medicine

## 2022-10-04 DIAGNOSIS — R54 Age-related physical debility: Secondary | ICD-10-CM

## 2022-10-04 DIAGNOSIS — E7800 Pure hypercholesterolemia, unspecified: Secondary | ICD-10-CM | POA: Diagnosis present

## 2022-10-04 DIAGNOSIS — J121 Respiratory syncytial virus pneumonia: Secondary | ICD-10-CM | POA: Diagnosis present

## 2022-10-04 DIAGNOSIS — E1122 Type 2 diabetes mellitus with diabetic chronic kidney disease: Secondary | ICD-10-CM

## 2022-10-04 DIAGNOSIS — N184 Chronic kidney disease, stage 4 (severe): Secondary | ICD-10-CM

## 2022-10-04 DIAGNOSIS — Z7901 Long term (current) use of anticoagulants: Secondary | ICD-10-CM | POA: Diagnosis not present

## 2022-10-04 DIAGNOSIS — Z9049 Acquired absence of other specified parts of digestive tract: Secondary | ICD-10-CM | POA: Diagnosis not present

## 2022-10-04 DIAGNOSIS — Z85828 Personal history of other malignant neoplasm of skin: Secondary | ICD-10-CM | POA: Diagnosis not present

## 2022-10-04 DIAGNOSIS — Z90710 Acquired absence of both cervix and uterus: Secondary | ICD-10-CM | POA: Diagnosis not present

## 2022-10-04 DIAGNOSIS — L8990 Pressure ulcer of unspecified site, unspecified stage: Secondary | ICD-10-CM | POA: Insufficient documentation

## 2022-10-04 DIAGNOSIS — I10 Essential (primary) hypertension: Secondary | ICD-10-CM

## 2022-10-04 DIAGNOSIS — J210 Acute bronchiolitis due to respiratory syncytial virus: Secondary | ICD-10-CM | POA: Diagnosis present

## 2022-10-04 DIAGNOSIS — Z6835 Body mass index (BMI) 35.0-35.9, adult: Secondary | ICD-10-CM | POA: Diagnosis not present

## 2022-10-04 DIAGNOSIS — I5032 Chronic diastolic (congestive) heart failure: Secondary | ICD-10-CM | POA: Diagnosis not present

## 2022-10-04 DIAGNOSIS — Z881 Allergy status to other antibiotic agents status: Secondary | ICD-10-CM | POA: Diagnosis not present

## 2022-10-04 DIAGNOSIS — I890 Lymphedema, not elsewhere classified: Secondary | ICD-10-CM | POA: Diagnosis not present

## 2022-10-04 DIAGNOSIS — Z1152 Encounter for screening for COVID-19: Secondary | ICD-10-CM | POA: Diagnosis not present

## 2022-10-04 DIAGNOSIS — I4821 Permanent atrial fibrillation: Secondary | ICD-10-CM

## 2022-10-04 DIAGNOSIS — I5042 Chronic combined systolic (congestive) and diastolic (congestive) heart failure: Secondary | ICD-10-CM | POA: Diagnosis present

## 2022-10-04 DIAGNOSIS — B338 Other specified viral diseases: Secondary | ICD-10-CM | POA: Diagnosis not present

## 2022-10-04 DIAGNOSIS — D500 Iron deficiency anemia secondary to blood loss (chronic): Secondary | ICD-10-CM | POA: Diagnosis present

## 2022-10-04 DIAGNOSIS — Z8249 Family history of ischemic heart disease and other diseases of the circulatory system: Secondary | ICD-10-CM | POA: Diagnosis not present

## 2022-10-04 DIAGNOSIS — Z880 Allergy status to penicillin: Secondary | ICD-10-CM | POA: Diagnosis not present

## 2022-10-04 DIAGNOSIS — D631 Anemia in chronic kidney disease: Secondary | ICD-10-CM | POA: Diagnosis present

## 2022-10-04 DIAGNOSIS — I130 Hypertensive heart and chronic kidney disease with heart failure and stage 1 through stage 4 chronic kidney disease, or unspecified chronic kidney disease: Secondary | ICD-10-CM | POA: Diagnosis present

## 2022-10-04 DIAGNOSIS — I4891 Unspecified atrial fibrillation: Secondary | ICD-10-CM | POA: Diagnosis present

## 2022-10-04 DIAGNOSIS — J9811 Atelectasis: Secondary | ICD-10-CM | POA: Diagnosis present

## 2022-10-04 DIAGNOSIS — Z888 Allergy status to other drugs, medicaments and biological substances status: Secondary | ICD-10-CM | POA: Diagnosis not present

## 2022-10-04 DIAGNOSIS — Z515 Encounter for palliative care: Secondary | ICD-10-CM | POA: Diagnosis not present

## 2022-10-04 DIAGNOSIS — Z96651 Presence of right artificial knee joint: Secondary | ICD-10-CM | POA: Diagnosis present

## 2022-10-04 LAB — CBC WITH DIFFERENTIAL/PLATELET
Abs Immature Granulocytes: 0.08 10*3/uL — ABNORMAL HIGH (ref 0.00–0.07)
Basophils Absolute: 0 10*3/uL (ref 0.0–0.1)
Basophils Relative: 0 %
Eosinophils Absolute: 0 10*3/uL (ref 0.0–0.5)
Eosinophils Relative: 0 %
HCT: 31.5 % — ABNORMAL LOW (ref 36.0–46.0)
Hemoglobin: 9.8 g/dL — ABNORMAL LOW (ref 12.0–15.0)
Immature Granulocytes: 1 %
Lymphocytes Relative: 13 %
Lymphs Abs: 1 10*3/uL (ref 0.7–4.0)
MCH: 30.4 pg (ref 26.0–34.0)
MCHC: 31.1 g/dL (ref 30.0–36.0)
MCV: 97.8 fL (ref 80.0–100.0)
Monocytes Absolute: 0.7 10*3/uL (ref 0.1–1.0)
Monocytes Relative: 9 %
Neutro Abs: 5.9 10*3/uL (ref 1.7–7.7)
Neutrophils Relative %: 77 %
Platelets: 223 10*3/uL (ref 150–400)
RBC: 3.22 MIL/uL — ABNORMAL LOW (ref 3.87–5.11)
RDW: 16.1 % — ABNORMAL HIGH (ref 11.5–15.5)
WBC: 7.8 10*3/uL (ref 4.0–10.5)
nRBC: 0 % (ref 0.0–0.2)

## 2022-10-04 LAB — GLUCOSE, CAPILLARY
Glucose-Capillary: 117 mg/dL — ABNORMAL HIGH (ref 70–99)
Glucose-Capillary: 108 mg/dL — ABNORMAL HIGH (ref 70–99)
Glucose-Capillary: 105 mg/dL — ABNORMAL HIGH (ref 70–99)
Glucose-Capillary: 93 mg/dL (ref 70–99)

## 2022-10-04 LAB — COMPREHENSIVE METABOLIC PANEL
ALT: 19 U/L (ref 0–44)
AST: 63 U/L — ABNORMAL HIGH (ref 15–41)
Albumin: 3.2 g/dL — ABNORMAL LOW (ref 3.5–5.0)
Alkaline Phosphatase: 48 U/L (ref 38–126)
Anion gap: 14 (ref 5–15)
BUN: 48 mg/dL — ABNORMAL HIGH (ref 8–23)
CO2: 20 mmol/L — ABNORMAL LOW (ref 22–32)
Calcium: 8.9 mg/dL (ref 8.9–10.3)
Chloride: 104 mmol/L (ref 98–111)
Creatinine, Ser: 2.65 mg/dL — ABNORMAL HIGH (ref 0.44–1.00)
GFR, Estimated: 17 mL/min — ABNORMAL LOW (ref >60)
Glucose, Bld: 111 mg/dL — ABNORMAL HIGH (ref 70–99)
Potassium: 3 mmol/L — ABNORMAL LOW (ref 3.5–5.1)
Sodium: 138 mmol/L (ref 135–145)
Total Bilirubin: 1.5 mg/dL — ABNORMAL HIGH (ref 0.3–1.2)
Total Protein: 6.4 g/dL — ABNORMAL LOW (ref 6.5–8.1)

## 2022-10-04 LAB — PROTIME-INR
INR: 1.4 — ABNORMAL HIGH (ref 0.8–1.2)
Prothrombin Time: 17 seconds — ABNORMAL HIGH (ref 11.4–15.2)

## 2022-10-04 LAB — MAGNESIUM: Magnesium: 1.8 mg/dL (ref 1.7–2.4)

## 2022-10-04 LAB — APTT: aPTT: 31 seconds (ref 24–36)

## 2022-10-04 LAB — RESP PANEL BY RT-PCR (RSV, FLU A&B, COVID)  RVPGX2
Influenza A by PCR: NEGATIVE
Influenza B by PCR: NEGATIVE
Resp Syncytial Virus by PCR: POSITIVE — AB
SARS Coronavirus 2 by RT PCR: NEGATIVE

## 2022-10-04 LAB — BRAIN NATRIURETIC PEPTIDE: B Natriuretic Peptide: 472.6 pg/mL — ABNORMAL HIGH (ref 0.0–100.0)

## 2022-10-04 MED ORDER — METOPROLOL SUCCINATE ER 25 MG PO TB24
100.0000 mg | ORAL_TABLET | Freq: Every evening | ORAL | Status: DC
  Administered 2022-10-04 – 2022-10-05 (×2): 100 mg via ORAL
  Filled 2022-10-04 (×2): qty 4

## 2022-10-04 MED ORDER — ISOSORBIDE MONONITRATE ER 30 MG PO TB24
15.0000 mg | ORAL_TABLET | Freq: Every day | ORAL | Status: DC
  Administered 2022-10-04 – 2022-10-05 (×2): 15 mg via ORAL
  Filled 2022-10-04 (×2): qty 1

## 2022-10-04 MED ORDER — ACETAMINOPHEN 325 MG PO TABS
650.0000 mg | ORAL_TABLET | Freq: Four times a day (QID) | ORAL | Status: DC | PRN
  Administered 2022-10-04 (×2): 650 mg via ORAL
  Filled 2022-10-04 (×2): qty 2

## 2022-10-04 MED ORDER — ONDANSETRON HCL 4 MG PO TABS: 4.0000 mg | ORAL_TABLET | Freq: Four times a day (QID) | ORAL | Status: DC | PRN

## 2022-10-04 MED ORDER — GEMFIBROZIL 600 MG PO TABS
600.0000 mg | ORAL_TABLET | Freq: Every day | ORAL | Status: DC
  Administered 2022-10-05 – 2022-10-06 (×2): 600 mg via ORAL
  Filled 2022-10-04 (×2): qty 1

## 2022-10-04 MED ORDER — ACETAMINOPHEN 650 MG RE SUPP: 650.0000 mg | Freq: Four times a day (QID) | RECTAL | Status: DC | PRN

## 2022-10-04 MED ORDER — FENOFIBRATE 160 MG PO TABS: 160.0000 mg | ORAL_TABLET | Freq: Every evening | ORAL | Status: DC

## 2022-10-04 MED ORDER — ONDANSETRON HCL 4 MG/2ML IJ SOLN: 4.0000 mg | Freq: Four times a day (QID) | INTRAMUSCULAR | Status: DC | PRN

## 2022-10-04 MED ORDER — PANTOPRAZOLE SODIUM 40 MG PO TBEC: 40.0000 mg | DELAYED_RELEASE_TABLET | Freq: Two times a day (BID) | ORAL | Status: DC

## 2022-10-04 MED ORDER — INSULIN ASPART 100 UNIT/ML IJ SOLN: 0.0000 [IU] | Freq: Three times a day (TID) | INTRAMUSCULAR | Status: DC

## 2022-10-04 MED ORDER — BENZONATATE 100 MG PO CAPS
200.0000 mg | ORAL_CAPSULE | Freq: Three times a day (TID) | ORAL | Status: DC | PRN
  Administered 2022-10-04 – 2022-10-05 (×2): 200 mg via ORAL
  Filled 2022-10-04 (×3): qty 2

## 2022-10-04 MED ORDER — DOXAZOSIN MESYLATE 4 MG PO TABS
4.0000 mg | ORAL_TABLET | Freq: Every day | ORAL | Status: DC
  Administered 2022-10-04 – 2022-10-06 (×3): 4 mg via ORAL
  Filled 2022-10-04 (×3): qty 1

## 2022-10-04 MED ORDER — SODIUM CHLORIDE 0.9 % IV SOLN: INTRAVENOUS | Status: AC

## 2022-10-04 MED ORDER — APIXABAN 2.5 MG PO TABS
2.5000 mg | ORAL_TABLET | Freq: Two times a day (BID) | ORAL | Status: DC
  Administered 2022-10-04 – 2022-10-06 (×5): 2.5 mg via ORAL
  Filled 2022-10-04 (×5): qty 1

## 2022-10-04 MED ORDER — FUROSEMIDE 40 MG PO TABS: 80.0000 mg | ORAL_TABLET | Freq: Two times a day (BID) | ORAL | Status: DC

## 2022-10-04 MED ORDER — INSULIN ASPART 100 UNIT/ML IJ SOLN: 0.0000 [IU] | Freq: Every day | INTRAMUSCULAR | Status: DC

## 2022-10-04 MED ORDER — POTASSIUM CHLORIDE 10 MEQ/100ML IV SOLN
10.0000 meq | INTRAVENOUS | Status: AC
  Administered 2022-10-04 (×3): 10 meq via INTRAVENOUS
  Filled 2022-10-04 (×3): qty 100

## 2022-10-04 NOTE — H&P (Signed)
History and Physical    Tabitha Black [id number] DOB: Jul 25, 1932 DOA: 10/03/2022  DOS: the patient was seen and examined on 10/03/2022  PCP: Collene Leyden, MD   Patient coming from: Home  I have personally briefly reviewed patient's old medical records in [place]  CC: cough HPI: 89 year old female history of CKD stage IV, diabetes, chronic systolic heart failure, bilateral lower extremity lymphedema right greater than left, A-fib, hypertension, morbid obesity presents to the ER today with worsening cough and shortness of breath.  Patient recently released from [facility] home on 09/29/2022.  She had been admitted there after having a GI bleed in February 2024.  Patient's Sister Tabitha Black states that she was walking when she left the nursing home.  Patient went home.  Patient lives alone.  Her Sister Tabitha Black lives next-door.  Patient's husband died last year.  She was seen by her PCP Dr. Esmeralda Links on 09/29/2022 and then again on 10/01/2022 due to shortness of breath.  Patient was prescribed Tessalon Perles.  Patient had gone to the hospital for an infusion of iron about a week before she left the nursing home.  Sister states the patient sat in a chair and was able to get out of the chair.  Sister was unable to stand her up due to her own physical ailments.  911 had to be called.  On arrival temp 98.4 heart rate 89 blood pressure 163/57 satting 99% on room air.  Labs:  RSV positive Influenza negative, COVID-negative BNP 472 INR 1.4 Sodium 136, potassium 3.5, BUN of 51, creatinine 2.9, total protein 7.3, albumin 3.6 Urine specific gravity 1.011, pH 5, 30 protein, negative leukocyte esterase, negative nitrites Lactic acid 1.4  White count 8.1, hemoglobin 10.2, platelets of 232   Chest x-ray demonstrated patchy atelectasis of the left base  EKG my interpretation shows rate controlled A-fib  [facility] hospitalist contacted for admission.   ED Course: cxr shows atelectasis left base.  RSV Positive.  Review of Systems:  Review of Systems  Unable to perform ROS: Other  Pt is a poor historian. History obtained from pt's sister Tabitha Black  Past Medical History:  Diagnosis Date   ABLA (acute blood loss anemia) 08/23/2022   CKD (chronic kidney disease)    Diabetes mellitus without complication (HCC)    type II   Gastrointestinal hemorrhage with melena 08/23/2022   GERD (gastroesophageal reflux disease)    High cholesterol    Hypertension    Osteoarthritis    Skin cancer, basal cell     Past Surgical History:  Procedure Laterality Date   ABDOMINAL HYSTERECTOMY N/A    APPENDECTOMY N/A 1953   BACK SURGERY     BIOPSY  08/25/2022   Procedure: BIOPSY;  Surgeon: Wilford Corner, MD;  Location: [facility];  Service: Gastroenterology;;   CHOLECYSTECTOMY     COLONOSCOPY N/A 02/22/2008   ESOPHAGOGASTRODUODENOSCOPY (EGD) WITH PROPOFOL N/A 08/25/2022   Procedure: ESOPHAGOGASTRODUODENOSCOPY (EGD) WITH PROPOFOL;  Surgeon: Wilford Corner, MD;  Location: [facility];  Service: Gastroenterology;  Laterality: N/A;   TOTAL KNEE ARTHROPLASTY Right    UMBILICAL HERNIA REPAIR N/A 04/02/2015   Procedure: LAPAROSCOPIC UMBILICAL HERNIA REPAIR WITH VENTRALIGHT MESH;  Surgeon: Ralene Ok, MD;  Location: [facility];  Service: General;  Laterality: N/A;     reports that she has never smoked. She has never used smokeless tobacco. She reports current alcohol use. She reports that she does not use drugs.  Allergies  Allergen Reactions   Other Swelling  Topical mycin, unsure of which one, caused opthalmic swelling  Erythromycin    Penicillins Itching, Other (See Comments), Rash, Shortness Of Breath and Swelling   Lovastatin Other (See Comments)    Weakness and myalgias, maybe to statins?   Azithromycin     Patient doesn't remember having a reaction to this.   Clindamycin     Patient doesn't remember having a reaction to this.    Hydralazine     Other reaction(s): felt foggy    Lisinopril Other (See Comments)    Hyperkalemia    Neomycin Sulfate [Neomycin] Other (See Comments)    Redness and burning in face   Niacin Hives    Facial swelling and redness   Rofecoxib Other (See Comments)    Unknown reaction   Tetracycline    Valsartan Other (See Comments)    Hyperkalemia    Vytorin [Ezetimibe-Simvastatin] Other (See Comments)    myalgias   Zetia [Ezetimibe] Other (See Comments)    Leg pain    Family History  Problem Relation Age of Onset   Heart failure Mother    Hypertension Mother    Heart attack Father    Prostate cancer Father    Breast cancer Sister     Prior to Admission medications   Medication Sig Start Date End Date Taking? Authorizing Provider  acetaminophen (TYLENOL) 325 MG tablet Take 2 tablets (650 mg total) by mouth every 6 (six) hours as needed for mild pain (or Fever >/= 101). 08/27/22   Ghimire, Henreitta Leber, MD  apixaban (ELIQUIS) 2.5 MG TABS tablet Take 1 tablet (2.5 mg total) by mouth 2 (two) times daily. 06/08/22   Josue Hector, MD  Cholecalciferol (VITAMIN D3) 125 MCG (5000 UT) TABS Take 5,000 Units by mouth daily.    [provider]  cyclobenzaprine (FLEXERIL) 10 MG tablet Take 5 mg by mouth daily as needed for muscle spasms.    [provider]  doxazosin (CARDURA) 4 MG tablet Take 4 mg by mouth daily.    [provider]  fenofibrate 160 MG tablet Take 160 mg by mouth every evening.     [provider]  furosemide (LASIX) 80 MG tablet Take 80 mg by mouth 2 (two) times daily. AT [place]    [provider]  gemfibrozil (LOPID) 600 MG tablet Take 600 mg by mouth daily. 05/08/20   [provider]  Iron-FA-B Cmp-C-Biot-Probiotic (FUSION PLUS) CAPS Take 1 capsule by mouth daily. 04/19/20   [provider]  metoprolol succinate (TOPROL-XL) 100 MG 24 hr tablet Take 100 mg by mouth every evening. Take with or immediately following a meal.    [provider]  Multiple  Vitamins-Minerals (HAIR SKIN AND NAILS FORMULA) TABS Take 1 tablet by mouth daily.    [provider]  NIFEdipine (PROCARDIA XL/NIFEDICAL XL) 60 MG 24 hr tablet Take 60 mg by mouth daily. 08/12/21   [provider]  pantoprazole (PROTONIX) 40 MG tablet Take 1 tablet (40 mg total) by mouth 2 (two) times daily. Take twice daily for 6 weeks, and then switch to once daily dosing 08/27/22 08/27/23  Jonetta Osgood, MD    Physical Exam: Vitals:   10/03/22 2315 10/03/22 2330 10/03/22 2345 10/04/22 0000  BP: (!) 141/49 (!) 140/47 (!) 140/50 (!) 140/66  Pulse: 79 70 65 80  Resp: (!) 22 18 16 20   Temp:      TempSrc:      SpO2: 97% 98% 97% 97%  Weight:  Height:        Physical Exam Vitals and nursing note reviewed.  Constitutional:      Appearance: She is obese. She is not toxic-appearing or diaphoretic.     Comments: Appears chronically ill  HENT:     Head: Normocephalic and atraumatic.     Nose: Nose normal.  Cardiovascular:     Rate and Rhythm: Normal rate. Rhythm irregular.     Pulses: Normal pulses.  Pulmonary:     Effort: Pulmonary effort is normal. No respiratory distress.     Breath sounds: Examination of the right-upper field reveals decreased breath sounds. Examination of the left-upper field reveals decreased breath sounds. Examination of the right-lower field reveals rales. Examination of the left-lower field reveals rales. Decreased breath sounds and rales present.  Abdominal:     General: Abdomen is protuberant. Bowel sounds are normal. There is no distension.     Palpations: Abdomen is soft.     Tenderness: There is no abdominal tenderness.  Musculoskeletal:     Right lower leg: Edema present.     Left lower leg: Edema present.     Comments: Pt with compression stocking on both lower legs. Unnaboot on left leg  Skin:    General: Skin is warm and dry.     Capillary Refill: Capillary refill takes less than 2 seconds.  Neurological:     Mental Status:  She is disoriented.      Labs on Admission: I have personally reviewed following labs and imaging studies  CBC: Recent Labs  Lab 10/03/22 2242  WBC 8.1  NEUTROABS 6.7  HGB 10.2*  HCT 32.9*  MCV 95.6  PLT [id number]   Basic Metabolic Panel: Recent Labs  Lab 10/03/22 2242  NA 136  K 3.5  CL 103  CO2 22  GLUCOSE 134*  BUN 51*  CREATININE 2.90*  CALCIUM 8.9   GFR: Estimated Creatinine Clearance: 14.7 mL/min (A) (by C-G formula based on SCr of 2.9 mg/dL (H)). Liver Function Tests: Recent Labs  Lab 10/03/22 2242  AST 75*  ALT 21  ALKPHOS 51  BILITOT 1.8*  PROT 7.3  ALBUMIN 3.6    Coagulation Profile: Recent Labs  Lab 10/03/22 2242  INR 1.4*   BNP (last 3 results) Recent Labs    10/03/22 2242  BNP 472.6*   Urine analysis:    Component Value Date/Time   COLORURINE YELLOW 10/03/2022 2247   [place] 10/03/2022 2247   LABSPEC 1.011 10/03/2022 2247   PHURINE 5.0 10/03/2022 [place] 10/03/2022 2247   HGBUR NEGATIVE 10/03/2022 2247   [place] NEGATIVE 10/03/2022 2247   [place] 10/03/2022 2247   PROTEINUR 30 (A) 10/03/2022 2247   UROBILINOGEN 1.0 04/01/2015 0425   NITRITE NEGATIVE 10/03/2022 2247   LEUKOCYTESUR NEGATIVE 10/03/2022 2247    Radiological Exams on Admission: I have personally reviewed images DG Chest Port 1 View  Result Date: 10/03/2022 CLINICAL DATA:  Lower extremity weakness EXAM: PORTABLE CHEST 1 VIEW COMPARISON:  12/06/2016 FINDINGS: Cardiomegaly with possible trace pleural effusions. Patchy atelectasis or pneumonia at left base. Aortic atherosclerosis. No pneumothorax IMPRESSION: Cardiomegaly with suspected trace pleural effusions. Patchy atelectasis or pneumonia at the left base. Electronically Signed   By: Donavan Foil M.D.   On: 10/03/2022 23:18    EKG: My personal interpretation of EKG shows: NSR    Assessment/Plan Principal Problem:   RSV infection Active Problems:   Age-related physical  debility   Chronic diastolic CHF (congestive heart failure) (  [place])   Chronic kidney disease, stage 4 (severe) (HCC)   Morbid obesity (HCC)   HTN (hypertension)   A-fib (HCC)   DM2 (diabetes mellitus, type 2) (HCC)   Lymphedema of both lower extremities - R > L    Assessment and Plan: * RSV infection Observation med/surg bed. Continue with supportive care.   Lymphedema of both lower extremities - R > L Chronic. PCP had referred pt to lymphedema clinic in [place], [place]. Not sure if pt even went. She does have unnaboot on left LE and compression stockings on both legs. Will stop nifedipine to see if this helps with lymphedema. Continue with demadex. Wound care consult.  Cardiology does not think her lymphedema is related to her diastolic CHF. There are pictures from 04-2022 from podiatry visit that shows the severity of her lymphedema.                     DM2 (diabetes mellitus, type 2) (HCC) Stable. Add SSI.  A-fib (HCC) Chronic. On Eliquis. And toprol-xl. Cards had discussion with pt about systemic anticoagulation in 05-2022 and she wanted to continue with Eliquis.  HTN (hypertension) Stop nifedipine due to LE edema/lymphedema. Change to imdur. Continue with toprol-xl and cardura. Can add additional cardura at night time if needed or switch to clonidine patch for more even HTN control.  Morbid obesity (HCC) BMI 35.7  Chronic kidney disease, stage 4 (severe) (HCC) Baseline Scr 2.5. however, given the degree of her weeping lymphedema, will not give Ivf.  Chronic diastolic CHF (congestive heart failure) (HCC) Stop nifedipine due to LE edema. Add long acting imdur for BP control in place of nifedipine. Continue with toprol-xl and cardura. Could add 2nd dose of cardura at nightime for addition BP control if needed.  Age-related physical debility Just got out of SNF last week. Living at home alone. Pt has sister that lives next door. Dtr that lives in [place], [place].  Sister(brenda) states that she cannot help patient with physical lifting due to her own ailments. Pt is probably not safe to live on her own. Pt's husband died last year. Likely needs long-term alternative living arrangements. TOC consult.   DVT prophylaxis: Eliquis Code Status: Full Code Family Communication: discussed at bedside with pt's sister Tabitha Black  Disposition Plan:  home with [facility] vs SNF placement Consults called: none  Admission status: Observation, Med-Surg   Kristopher Oppenheim, DO [facility] 10/04/2022, 1:39 AM

## 2022-10-04 NOTE — Assessment & Plan Note (Signed)
BMI 35.7

## 2022-10-04 NOTE — Assessment & Plan Note (Addendum)
Stop nifedipine due to LE edema/lymphedema. Change to imdur. Continue with toprol-xl and cardura. Can add additional cardura at night time if needed or switch to clonidine patch for more even HTN control.

## 2022-10-04 NOTE — Progress Notes (Signed)
    Patient: Tabitha Black [id number] DOB: 11/02/1932      Brief hospital course: [name] is an 89 y.o. F with CKD IV baseline 2.5, lymphededema, Afib on Eliquis HTN, dCHF, and obesity who presented with 1 week cough and now weakness.    This is a no charge note, for further details, please see the H&P by my partner, Dr. Bridgett Larsson from earlier today.   Principal Problem:   RSV infection Active Problems:   Age-related physical debility   Chronic diastolic CHF (congestive heart failure) (HCC)   Chronic kidney disease, stage 4 (severe) (HCC)   Morbid obesity (HCC)   HTN (hypertension)   A-fib (HCC)   DM2 (diabetes mellitus, type 2) (HCC)   Lymphedema of both lower extremities - R > L   RSV (respiratory syncytial virus infection)   Pressure injury of skin    Patient remains tachycardic, tachypneic.  Has RSV infection.       Physical Exam: BP (!) 154/61 (BP Location: Right Arm)   Pulse (!) 102   Temp 98.2 F (36.8 C) (Oral)   Resp 20   Ht 5\' 3"  (1.6 m)   Wt 91.6 kg   SpO2 100%   BMI 35.77 kg/m   Patient seen and examined.     Family Communication: Daughter at bedsdie        Author: Edwin Dada, MD 10/04/2022 2:01 PM

## 2022-10-04 NOTE — Assessment & Plan Note (Deleted)
Chronic. PCP had referred pt to lymphedema clinic in [place], [place]. Not sure if pt even went. She does have unnaboot on left LE and compression stockings on both legs. Will stop nifedipine to see if this helps with lymphedema. Continue with demadex. Wound care consult.  Cardiology does not think her lymphedema is related to her diastolic CHF. There are pictures from 04-2022 from podiatry visit that shows the severity of her lymphedema.

## 2022-10-04 NOTE — Progress Notes (Signed)
PT demonstrated hands on understanding of Flutter device. NPC at this time. PT is drowsy and Caregiver states she will encourage usage.

## 2022-10-04 NOTE — Assessment & Plan Note (Signed)
Cr stable in baseline range 2.3-2.6

## 2022-10-04 NOTE — Assessment & Plan Note (Signed)
Stable. Add SSI. 

## 2022-10-04 NOTE — Consult Note (Addendum)
[facility] Nurse Consult Note: Reason for Consult: Consult requested for bilat legs. Pt has generalized lymphedema and wears custom fitted compression stockings to attempt to reduce swelling.  Removed to inspect skin, left leg with generalized erythremia, no open wounds.  Right leg with generalized erythremia and 2 areas of partial thickness skin loss where previous yellow fluid filled blisters have ruptured, pink and moist, some yellow drainage, 2X2X.1cm and 1X1X.1cm Dressing procedure/placement/frequency: Topical treatment orders provided for bedside nurses to perform as follows: Foam dressing to blistered or open areas on legs.  Change Q 3 days or PRN soiling.  Remove patient's lymphadenia stockings to bilat legs to inspect skin Q shift, then re-apply. Please re-consult if further assistance is needed.  Thank-you,  Julien Girt MSN, [place], [place], [place], [place]

## 2022-10-04 NOTE — Subjective & Objective (Signed)
CC: cough HPI: 89 year old female history of CKD stage IV, diabetes, chronic systolic heart failure, bilateral lower extremity lymphedema right greater than left, A-fib, hypertension, morbid obesity presents to the ER today with worsening cough and shortness of breath.  Patient recently released from [facility] home on 09/29/2022.  She had been admitted there after having a GI bleed in February 2024.  Patient's Sister [name] states that she was walking when she left the nursing home.  Patient went home.  Patient lives alone.  Her Sister [name] lives next-door.  Patient's husband died last year.  She was seen by her PCP Dr. Esmeralda Links on 09/29/2022 and then again on 10/01/2022 due to shortness of breath.  Patient was prescribed Tessalon Perles.  Patient had gone to the hospital for an infusion of iron about a week before she left the nursing home.  Sister states the patient sat in a chair and was able to get out of the chair.  Sister was unable to stand her up due to her own physical ailments.  911 had to be called.  On arrival temp 98.4 heart rate 89 blood pressure 163/57 satting 99% on room air.  Labs:  RSV positive Influenza negative, COVID-negative BNP 472 INR 1.4 Sodium 136, potassium 3.5, BUN of 51, creatinine 2.9, total protein 7.3, albumin 3.6 Urine specific gravity 1.011, pH 5, 30 protein, negative leukocyte esterase, negative nitrites Lactic acid 1.4  White count 8.1, hemoglobin 10.2, platelets of 232   Chest x-ray demonstrated patchy atelectasis of the left base  EKG my interpretation shows rate controlled A-fib  [facility] contacted for admission.

## 2022-10-04 NOTE — Evaluation (Signed)
Occupational Therapy Evaluation Patient Details Name: Tabitha Black MRN: [id number] DOB: Dec 25, 1932 Today's Date: 10/04/2022   History of Present Illness Patient is a 89 year old female who presented on 3/17 with weakness and cough. Patient was admitted with RSV. PMH: CKD, a fib, HTN, CHF, obesity.   Clinical Impression   Patient is a 89 year old female who was admitted for above. Patient recently transitioned home from SNF independently. Currently, patient is mod A for transfers with increased pain in back and BLE, patient is max A for LB dressing/bathing tasks with decreased functional activity tolerance. Patient plans to transition back home alone at time of d/c. Patient would continue to benefit from skilled OT services at this time while admitted and after d/c to address noted deficits in order to improve overall safety and independence in ADLs.       Recommendations for follow up therapy are one component of a multi-disciplinary discharge planning process, led by the attending physician.  Recommendations may be updated based on patient status, additional functional criteria and insurance authorization.   Follow Up Recommendations  Home health OT     Assistance Recommended at Discharge Frequent or constant Supervision/Assistance  Patient can return home with the following A little help with bathing/dressing/bathroom;Assistance with cooking/housework;Direct supervision/assist for medications management;Assist for transportation;Help with stairs or ramp for entrance;Direct supervision/assist for financial management;A lot of help with walking and/or transfers    Functional Status Assessment  Patient has had a recent decline in their functional status and demonstrates the ability to make significant improvements in function in a reasonable and predictable amount of time.  Equipment Recommendations  None recommended by OT       Precautions / Restrictions Precautions Precaution  Comments: monitor vitals, lymphedema BLE Restrictions Weight Bearing Restrictions: No      Mobility Bed Mobility Overal bed mobility: Needs Assistance Bed Mobility: Supine to Sit     Supine to sit: Mod assist     General bed mobility comments: with physical A for trunk control and to advance BLE to EOB. patient needed max A to scoot to EOB with increased time.        Balance Overall balance assessment: Mild deficits observed, not formally tested         ADL either performed or assessed with clinical judgement   ADL Overall ADL's : Needs assistance/impaired Eating/Feeding: Modified independent;Sitting   Grooming: Set up;Sitting   Upper Body Bathing: Min guard;Sitting   Lower Body Bathing: Maximal assistance;Bed level   Upper Body Dressing : Min guard;Sitting   Lower Body Dressing: Bed level;Maximal assistance   Toilet Transfer: Moderate assistance;Ambulation;Rolling walker (2 wheels) Toilet Transfer Details (indicate cue type and reason): to transfer from edge of bed to recliner in room. Toileting- Clothing Manipulation and Hygiene: Maximal assistance;Sit to/from stand               Vision Baseline Vision/History: 1 Wears glasses              Pertinent Vitals/Pain Pain Assessment Pain Assessment: Faces Faces Pain Scale: Hurts whole lot Pain Location: BLE with movement Pain Descriptors / Indicators: Grimacing Pain Intervention(s): Limited activity within patient's tolerance, Repositioned, Monitored during session     Hand Dominance Right   Extremity/Trunk Assessment Upper Extremity Assessment Upper Extremity Assessment: Overall WFL for tasks assessed   Lower Extremity Assessment Lower Extremity Assessment: Defer to PT evaluation   Cervical / Trunk Assessment Cervical / Trunk Assessment: Normal   Communication Communication Communication: [facility]  Cognition Arousal/Alertness: Awake/alert Behavior During Therapy: WFL for tasks  assessed/performed Overall Cognitive Status: Within Functional Limits for tasks assessed     General Comments: daughter was also present in room                Home Living Family/patient expects to be discharged to:: Private residence Living Arrangements: Alone Available Help at Discharge: Family;Available PRN/intermittently Type of Home: House Home Access: Ramped entrance     Home Layout: One level     Bathroom Shower/Tub: Occupational psychologist: Standard     Home Equipment: Toilet riser;Shower seat;Grab bars - toilet;Grab bars - tub/shower;Other (comment)   Additional Comments: does not wear O2 at home, sleeps in lift chair      Prior Functioning/Environment Prior Level of Function : Needs assist;Driving             Mobility Comments: uses RW ADLs Comments: independent but says it takes some time, drives short distances, sponge bathes due to step into shower being to high        OT Problem List: Decreased activity tolerance;Impaired balance (sitting and/or standing);Decreased coordination;Decreased safety awareness;Decreased knowledge of precautions;Pain;Decreased knowledge of use of DME or AE      OT Treatment/Interventions: Self-care/ADL training;Energy conservation;Therapeutic exercise;DME and/or AE instruction;Therapeutic activities;Patient/family education;Balance training    OT Goals(Current goals can be found in the care plan section) Acute Rehab OT Goals Patient Stated Goal: to go home OT Goal Formulation: With patient/family Time For Goal Achievement: 10/18/22 Potential to Achieve Goals: Fair  OT Frequency: Min 2X/week       AM-PAC OT "6 Clicks" Daily Activity     Outcome Measure Help from another person eating meals?: None Help from another person taking care of personal grooming?: A Little Help from another person toileting, which includes using toliet, bedpan, or urinal?: A Lot Help from another person bathing (including washing,  rinsing, drying)?: A Lot Help from another person to put on and taking off regular upper body clothing?: A Little Help from another person to put on and taking off regular lower body clothing?: A Lot 6 Click Score: 16   End of Session Equipment Utilized During Treatment: Gait belt;Rolling walker (2 wheels) Nurse Communication: Other (comment) (need for pure wic, gripper sock issue, and pain in legs)  Activity Tolerance: Patient tolerated treatment well Patient left: in chair;with call bell/phone within reach;with family/visitor present  OT Visit Diagnosis: Unsteadiness on feet (R26.81);Other abnormalities of gait and mobility (R26.89);Muscle weakness (generalized) (M62.81);Pain                Time: 1531-1600 OT Time Calculation (min): 29 min Charges:  OT General Charges $OT Visit: 1 Visit OT Evaluation $OT Eval Moderate Complexity: 1 Mod OT Treatments $Self Care/Home Management : 8-22 mins  Rennie Plowman, MS Acute Rehabilitation Department Office# [phone]   Willa Rough 10/04/2022, 4:10 PM

## 2022-10-04 NOTE — ED Notes (Signed)
ED TO INPATIENT HANDOFF REPORT  ED Nurse Name and Phone #: Alroy Bailiff Name/Age/Gender Tabitha Black 89 y.o. female Room/Bed: WA02/WA02  Code Status   Code Status: Full Code  Home/SNF/Other Home Patient oriented to: self, place, time, and situation Is this baseline? Yes   Triage Complete: Triage complete  Chief Complaint RSV infection [B33.8]  Triage Note Has bilaterally lower extremity weakness with weeping and blisters noted. Was at rehab recently for this same complaint. But states that they look much worse now.  When she was discharged from a rehab facility she started to have a cough, has been treated for it but it has come back     EMS states that she had low oxygen when they arrived, she was placed on 2L    625 tylenol given 800 LR given  Crackles Left lower, diminished left upper lobe  160-CBG 90 RA 20 LAC    Allergies Allergies  Allergen Reactions   Other Swelling    Topical mycin, unsure of which one, caused opthalmic swelling  Erythromycin    Penicillins Itching, Other (See Comments), Rash, Shortness Of Breath and Swelling   Lovastatin Other (See Comments)    Weakness and myalgias, maybe to statins?   Azithromycin     Patient doesn't remember having a reaction to this.   Clindamycin     Patient doesn't remember having a reaction to this.    Hydralazine     Other reaction(s): felt foggy   Lisinopril Other (See Comments)    Hyperkalemia    Neomycin Sulfate [Neomycin] Other (See Comments)    Redness and burning in face   Niacin Hives    Facial swelling and redness   Rofecoxib Other (See Comments)    Unknown reaction   Tetracycline    Valsartan Other (See Comments)    Hyperkalemia    Vytorin [Ezetimibe-Simvastatin] Other (See Comments)    myalgias   Zetia [Ezetimibe] Other (See Comments)    Leg pain    Level of Care/Admitting Diagnosis ED Disposition     ED Disposition  Admit   Condition  --   Comment  Hospital Area: [place] [100102]  Level of Care: Med-Surg [16]  May place patient in observation at [facility] or [place] if equivalent level of care is available:: No  Covid Evaluation: Asymptomatic - no recent exposure (last 10 days) testing not required  Diagnosis: RSV infection [id number]  Admitting Physician: Bridgett Larsson, [place]  Attending Physician: Bridgett Larsson, ERIC [[id number]]          B Medical/Surgery History Past Medical History:  Diagnosis Date   ABLA (acute blood loss anemia) 08/23/2022   CKD (chronic kidney disease)    Diabetes mellitus without complication ([place])    type II   Gastrointestinal hemorrhage with melena 08/23/2022   GERD (gastroesophageal reflux disease)    High cholesterol    Hypertension    Osteoarthritis    Skin cancer, basal cell    Past Surgical History:  Procedure Laterality Date   ABDOMINAL HYSTERECTOMY N/A    APPENDECTOMY N/A 1953   BACK SURGERY     BIOPSY  08/25/2022   Procedure: BIOPSY;  Surgeon: Wilford Corner, MD;  Location: [place];  Service: Gastroenterology;;   CHOLECYSTECTOMY     COLONOSCOPY N/A 02/22/2008   ESOPHAGOGASTRODUODENOSCOPY (EGD) WITH PROPOFOL N/A 08/25/2022   Procedure: ESOPHAGOGASTRODUODENOSCOPY (EGD) WITH PROPOFOL;  Surgeon: Wilford Corner, MD;  Location: [place];  Service: Gastroenterology;  Laterality: N/A;  TOTAL KNEE ARTHROPLASTY Right    UMBILICAL HERNIA REPAIR N/A 04/02/2015   Procedure: LAPAROSCOPIC UMBILICAL HERNIA REPAIR WITH VENTRALIGHT MESH;  Surgeon: Ralene Ok, MD;  Location: [place];  Service: General;  Laterality: N/A;     A IV Location/Drains/Wounds Patient Lines/Drains/Airways Status     Active Line/Drains/Airways     Name Placement date Placement time Site Days   Peripheral IV 10/03/22 18 G Right Antecubital 10/03/22  2258  Antecubital  1   Peripheral IV 10/03/22 20 G Left Antecubital 10/03/22  2259  Antecubital  1   Wound / Incision (Open or Dehisced) 08/24/22 Non-pressure wound  Pretibial Left;Lateral;Lower 1.3 cms x 2 cms x 0.1 cms likely r/t lymphedema 08/24/22  1000  Pretibial  41            Intake/Output Last 24 hours No intake or output data in the 24 hours ending 10/04/22 0202  Labs/Imaging Results for orders placed or performed during the hospital encounter of 10/03/22 (from the past 48 hour(s))  Comprehensive metabolic panel     Status: Abnormal   Collection Time: 10/03/22 10:42 PM  Result Value Ref Range   Sodium 136 135 - 145 mmol/L   Potassium 3.5 3.5 - 5.1 mmol/L   Chloride 103 98 - 111 mmol/L   CO2 22 22 - 32 mmol/L   Glucose, Bld 134 (H) 70 - 99 mg/dL    Comment: Glucose reference range applies only to samples taken after fasting for at least 8 hours.   BUN 51 (H) 8 - 23 mg/dL   Creatinine, Ser 2.90 (H) 0.44 - 1.00 mg/dL   Calcium 8.9 8.9 - 10.3 mg/dL   Total Protein 7.3 6.5 - 8.1 g/dL   Albumin 3.6 3.5 - 5.0 g/dL   AST 75 (H) 15 - 41 U/L   ALT 21 0 - 44 U/L   Alkaline Phosphatase 51 38 - 126 U/L   Total Bilirubin 1.8 (H) 0.3 - 1.2 mg/dL   GFR, Estimated 15 (L) >60 mL/min    Comment: (NOTE) Calculated using the CKD-EPI Creatinine Equation (2021)    Anion gap 11 5 - 15    Comment: Performed at [facility], [place] [address] [place], [place] [address]  CBC with Differential     Status: Abnormal   Collection Time: 10/03/22 10:42 PM  Result Value Ref Range   WBC 8.1 4.0 - 10.5 K/uL   RBC 3.44 (L) 3.87 - 5.11 MIL/uL   Hemoglobin 10.2 (L) 12.0 - 15.0 g/dL   HCT 32.9 (L) 36.0 - 46.0 %   MCV 95.6 80.0 - 100.0 fL   MCH 29.7 26.0 - 34.0 pg   MCHC 31.0 30.0 - 36.0 g/dL   RDW 16.1 (H) 11.5 - 15.5 %   Platelets 232 150 - 400 K/uL   nRBC 0.0 0.0 - 0.2 %   Neutrophils Relative % 83 %   Neutro Abs 6.7 1.7 - 7.7 K/uL   Lymphocytes Relative 9 %   Lymphs Abs 0.7 0.7 - 4.0 K/uL   Monocytes Relative 7 %   Monocytes Absolute 0.6 0.1 - 1.0 K/uL   Eosinophils Relative 0 %   Eosinophils Absolute 0.0 0.0 - 0.5 K/uL   Basophils  Relative 0 %   Basophils Absolute 0.0 0.0 - 0.1 K/uL   Immature Granulocytes 1 %   Abs Immature Granulocytes 0.08 (H) 0.00 - 0.07 K/uL    Comment: Performed at [facility], [place] [address] [place], [place] [address]  Protime-INR     Status: Abnormal   Collection Time: 10/03/22 10:42 PM  Result Value Ref Range   Prothrombin Time 17.0 (H) 11.4 - 15.2 seconds   INR 1.4 (H) 0.8 - 1.2    Comment: (NOTE) INR goal varies based on device and disease states. Performed at [facility], [place] [address] [place], [place] [address]   APTT     Status: None   Collection Time: 10/03/22 10:42 PM  Result Value Ref Range   aPTT 31 24 - 36 seconds    Comment: Performed at [facility], [place] [address], [place], [place] [address]  Brain natriuretic peptide     Status: Abnormal   Collection Time: 10/03/22 10:42 PM  Result Value Ref Range   B Natriuretic Peptide 472.6 (H) 0.0 - 100.0 pg/mL    Comment: Performed at [facility], [place] [address], [place], [place] [address]  Lactic acid, plasma     Status: None   Collection Time: 10/03/22 10:47 PM  Result Value Ref Range   Lactic Acid, Venous 1.4 0.5 - 1.9 mmol/L    Comment: Performed at [facility], [place] [address] [place], [place] [address]  Urinalysis, w/ Reflex to Culture (Infection Suspected) -Urine, Unspecified Source     Status: Abnormal   Collection Time: 10/03/22 10:47 PM  Result Value Ref Range   Specimen Source URINE, UNSPE    Color, Urine YELLOW YELLOW   APPearance CLEAR CLEAR   Specific Gravity, Urine 1.011 1.005 - 1.030   pH 5.0 5.0 - 8.0   Glucose, UA NEGATIVE NEGATIVE mg/dL   Hgb urine dipstick NEGATIVE NEGATIVE   Bilirubin Urine NEGATIVE NEGATIVE   Ketones, ur NEGATIVE NEGATIVE mg/dL   Protein, ur 30 (A) NEGATIVE mg/dL   Nitrite NEGATIVE NEGATIVE   Leukocytes,Ua NEGATIVE NEGATIVE   RBC / HPF 0-5 0 - 5 RBC/hpf   WBC, UA 0-5 0 - 5 WBC/hpf     Comment:        Reflex urine culture not performed if WBC <=10, OR if Squamous epithelial cells >5. If Squamous epithelial cells >5 suggest recollection.    Bacteria, UA NONE SEEN NONE SEEN   Squamous Epithelial / HPF 0-5 0 - 5 /HPF   Mucus PRESENT    Hyaline Casts, UA PRESENT     Comment: Performed at [facility], [place] [address], [place], [place] [address]  Resp panel by RT-PCR (RSV, Flu A&B, Covid)     Status: Abnormal   Collection Time: 10/03/22 10:47 PM   Specimen: Nasal Swab  Result Value Ref Range   SARS Coronavirus 2 by RT PCR NEGATIVE NEGATIVE    Comment: (NOTE) SARS-CoV-2 target nucleic acids are NOT DETECTED.  The SARS-CoV-2 RNA is generally detectable in upper respiratory specimens during the acute phase of infection. The lowest concentration of SARS-CoV-2 viral copies this assay can detect is 138 copies/mL. A negative result does not preclude SARS-Cov-2 infection and should not be used as the sole basis for treatment or other patient management decisions. A negative result may occur with  improper specimen collection/handling, submission of specimen other than nasopharyngeal swab, presence of viral mutation(s) within the areas targeted by this assay, and inadequate number of viral copies(<138 copies/mL). A negative result must be combined with clinical observations, patient history, and epidemiological information. The expected result is Negative.  Fact Sheet for Patients:  EntrepreneurPulse.com.au  Fact Sheet for Healthcare Providers:  IncredibleEmployment.be  This test is  no t yet approved or cleared by the [place] and  has been authorized for detection and/or diagnosis of SARS-CoV-2 by FDA under an Emergency Use Authorization (EUA). This EUA will remain  in effect (meaning this test can be used) for the duration of the COVID-19 declaration under Section 564(b)(1) of the Act, 21 U.S.C.section  360bbb-3(b)(1), unless the authorization is terminated  or revoked sooner.       Influenza A by PCR NEGATIVE NEGATIVE   Influenza B by PCR NEGATIVE NEGATIVE    Comment: (NOTE) The Xpert Xpress SARS-CoV-2/FLU/RSV plus assay is intended as an aid in the diagnosis of influenza from Nasopharyngeal swab specimens and should not be used as a sole basis for treatment. Nasal washings and aspirates are unacceptable for Xpert Xpress SARS-CoV-2/FLU/RSV testing.  Fact Sheet for Patients: EntrepreneurPulse.com.au  Fact Sheet for Healthcare Providers: IncredibleEmployment.be  This test is not yet approved or cleared by the [place] FDA and has been authorized for detection and/or diagnosis of SARS-CoV-2 by FDA under an Emergency Use Authorization (EUA). This EUA will remain in effect (meaning this test can be used) for the duration of the COVID-19 declaration under Section 564(b)(1) of the Act, 21 U.S.C. section 360bbb-3(b)(1), unless the authorization is terminated or revoked.     Resp Syncytial Virus by PCR POSITIVE (A) NEGATIVE    Comment: (NOTE) Fact Sheet for Patients: EntrepreneurPulse.com.au  Fact Sheet for Healthcare Providers: IncredibleEmployment.be  This test is not yet approved or cleared by the [place] FDA and has been authorized for detection and/or diagnosis of SARS-CoV-2 by FDA under an Emergency Use Authorization (EUA). This EUA will remain in effect (meaning this test can be used) for the duration of the COVID-19 declaration under Section 564(b)(1) of the Act, 21 U.S.C. section 360bbb-3(b)(1), unless the authorization is terminated or revoked.  Performed at [facility], [place] [address], [place], [place] [address]    DG Chest Port 1 View  Result Date: 10/03/2022 CLINICAL DATA:  Lower extremity weakness EXAM: PORTABLE CHEST 1 VIEW COMPARISON:  12/06/2016 FINDINGS:  Cardiomegaly with possible trace pleural effusions. Patchy atelectasis or pneumonia at left base. Aortic atherosclerosis. No pneumothorax IMPRESSION: Cardiomegaly with suspected trace pleural effusions. Patchy atelectasis or pneumonia at the left base. Electronically Signed   By: Donavan Foil M.D.   On: 10/03/2022 23:18    Pending Labs Unresulted Labs (From admission, onward)     Start     Ordered   10/03/22 2220  Blood Culture (routine x 2)  (Undifferentiated presentation (screening labs and basic nursing orders))  BLOOD CULTURE X 2,   STAT      10/03/22 2220   Signed and Held  Comprehensive metabolic panel  Tomorrow morning,   R        Signed and Held   Signed and Held  CBC with Differential/Platelet  Tomorrow morning,   R        Signed and Held   Signed and Held  Magnesium  Tomorrow morning,   R        Signed and Held            Vitals/Pain Today's Vitals   10/03/22 2315 10/03/22 2330 10/03/22 2345 10/04/22 0000  BP: (!) 141/49 (!) 140/47 (!) 140/50 (!) 140/66  Pulse: 79 70 65 80  Resp: (!) 22 18 16 20   Temp:      TempSrc:      SpO2: 97% 98% 97% 97%  Weight:  Height:      PainSc:        Isolation Precautions No active isolations  Medications Medications - No data to display  Mobility walks with person assist     Focused Assessments    R Recommendations: See Admitting Provider Note  Report given to:   Additional Notes:

## 2022-10-04 NOTE — Assessment & Plan Note (Signed)
Chronic. On Eliquis. And toprol-xl. Cards had discussion with pt about systemic anticoagulation in 05-2022 and she wanted to continue with Eliquis.

## 2022-10-04 NOTE — Assessment & Plan Note (Signed)
Observation med/surg bed. Continue with supportive care.

## 2022-10-04 NOTE — Assessment & Plan Note (Signed)
Stop nifedipine due to LE edema. Add long acting imdur for BP control in place of nifedipine. Continue with toprol-xl and cardura. Could add 2nd dose of cardura at nightime for addition BP control if needed.

## 2022-10-05 DIAGNOSIS — B338 Other specified viral diseases: Secondary | ICD-10-CM | POA: Diagnosis not present

## 2022-10-05 LAB — BASIC METABOLIC PANEL
Anion gap: 15 (ref 5–15)
BUN: 47 mg/dL — ABNORMAL HIGH (ref 8–23)
CO2: 20 mmol/L — ABNORMAL LOW (ref 22–32)
Calcium: 8.9 mg/dL (ref 8.9–10.3)
Chloride: 106 mmol/L (ref 98–111)
Creatinine, Ser: 2.3 mg/dL — ABNORMAL HIGH (ref 0.44–1.00)
GFR, Estimated: 20 mL/min — ABNORMAL LOW (ref >60)
Glucose, Bld: 89 mg/dL (ref 70–99)
Potassium: 3.5 mmol/L (ref 3.5–5.1)
Sodium: 141 mmol/L (ref 135–145)

## 2022-10-05 LAB — CBC
HCT: 27.9 % — ABNORMAL LOW (ref 36.0–46.0)
Hemoglobin: 8.6 g/dL — ABNORMAL LOW (ref 12.0–15.0)
MCH: 29.9 pg (ref 26.0–34.0)
MCHC: 30.8 g/dL (ref 30.0–36.0)
MCV: 96.9 fL (ref 80.0–100.0)
Platelets: 239 10*3/uL (ref 150–400)
RBC: 2.88 MIL/uL — ABNORMAL LOW (ref 3.87–5.11)
RDW: 16.4 % — ABNORMAL HIGH (ref 11.5–15.5)
WBC: 8.6 10*3/uL (ref 4.0–10.5)
nRBC: 0 % (ref 0.0–0.2)

## 2022-10-05 MED ORDER — EPOETIN ALFA-EPBX 10000 UNIT/ML IJ SOLN: 10000.0000 [IU] | Freq: Once | INTRAMUSCULAR | Status: DC

## 2022-10-05 MED ORDER — DARBEPOETIN ALFA 60 MCG/0.3ML IJ SOSY
60.0000 ug | PREFILLED_SYRINGE | Freq: Once | INTRAMUSCULAR | Status: AC
  Administered 2022-10-05: 60 ug via SUBCUTANEOUS
  Filled 2022-10-05: qty 0.3

## 2022-10-05 MED ORDER — TORSEMIDE 20 MG PO TABS
20.0000 mg | ORAL_TABLET | Freq: Two times a day (BID) | ORAL | Status: DC
  Administered 2022-10-06: 20 mg via ORAL
  Filled 2022-10-05: qty 1

## 2022-10-05 NOTE — Evaluation (Signed)
Physical Therapy Evaluation Patient Details Name: Tabitha Black MRN: [id number] DOB: 05/17/1933 Today's Date: 10/05/2022  History of Present Illness  Patient is a 89 year old female who presented on 3/17 with generalized weakness, SOB with pt requiring 2 L/min at arrival to ED, cough and B LE weeping edema. Patient was admitted with RSV. PMH: CKD, a fib, HTN, CHF, obesity.  Clinical Impression    Pt admitted with above diagnosis.  Pt currently with functional limitations due to the deficits listed below (see PT Problem List). Pt seated in recliner when PT arrived, daughter present t/o evaluation. Pt reported feeling sore and tired with B proximal LE due to attempting to transfer from glider style rocker for 6 hrs on 3/17 with emergency call bell/button out of reach. Pt lives alone with sister close, pt will require increased S when transitioning home with recent short term rehab stay following GI bleed 08/2022. PT recommends donning B shoes to accommodate for B LE edema and safety. Pt required mod A for STS from recliner and min A with subsequent trail, cues for proper UE and AD placement, amb 48 feet with RW and min guard. Pt left seated in recliner, all needs met and set up for lunch and daughter present. Nursing staff made aware of PT concerns with skin integrity.   Pt will benefit from skilled PT to increase their independence and safety with mobility to allow discharge to the venue.       Recommendations for follow up therapy are one component of a multi-disciplinary discharge planning process, led by the attending physician.  Recommendations may be updated based on patient status, additional functional criteria and insurance authorization.  Follow Up Recommendations Home health PT      Assistance Recommended at Discharge Frequent or constant Supervision/Assistance  Patient can return home with the following  A little help with walking and/or transfers;A little help with  bathing/dressing/bathroom;Assistance with cooking/housework;Assist for transportation;Help with stairs or ramp for entrance    Equipment Recommendations None recommended by PT  Recommendations for Other Services       Functional Status Assessment Patient has had a recent decline in their functional status and demonstrates the ability to make significant improvements in function in a reasonable and predictable amount of time.     Precautions / Restrictions Precautions Precautions: Fall Precaution Comments: monitor vitals, lymphedema BLE, has shoes in room Restrictions Weight Bearing Restrictions: No      Mobility  Bed Mobility               General bed mobility comments: in recliner when PT arrived    Transfers Overall transfer level: Needs assistance   Transfers: Sit to/from Stand Sit to Stand: Mod assist           General transfer comment: pt indicates mm weakness and pain B proximal LEs due to attempting to get out of glider rocking chair for 6 hrs on 3/17,    Ambulation/Gait Ambulation/Gait assistance: Min guard Gait Distance (Feet): 48 Feet Assistive device: Rolling walker (2 wheels) Gait Pattern/deviations: Step-to pattern, Shuffle Gait velocity: decreased     General Gait Details: flexed postures  Stairs            Wheelchair Mobility    Modified Rankin (Stroke Patients Only)       Balance Overall balance assessment: Needs assistance Sitting-balance support: Feet supported Sitting balance-Leahy Scale: Fair     Standing balance support: Reliant on assistive device for balance, During functional activity Standing balance-Leahy  Scale: Poor                               Pertinent Vitals/Pain Pain Assessment Pain Assessment: 0-10 Pain Score: 5  Pain Location: B LEs Pain Descriptors / Indicators: Constant, Heaviness, Tender Pain Intervention(s): Monitored during session, Limited activity within patient's tolerance     Home Living Family/patient expects to be discharged to:: Private residence Living Arrangements: Alone Available Help at Discharge: Family;Available PRN/intermittently Type of Home: House Home Access: Ramped entrance       Home Layout: One level Home Equipment: Toilet riser;Shower seat;Grab bars - toilet;Grab bars - tub/shower;Other (comment) Additional Comments: does not wear O2 at home, sleeps in lift chair    Prior Function Prior Level of Function : Needs assist;Driving             Mobility Comments: uses RW ADLs Comments: mod I, RW for stability but says it takes some time, drives short distances, sponge bathes due to step into shower being to high, requires assist with B LE edema wraps     Hand Dominance   Dominant Hand: Right    Extremity/Trunk Assessment        Lower Extremity Assessment Lower Extremity Assessment: Generalized weakness    Cervical / Trunk Assessment Cervical / Trunk Assessment: Normal  Communication   Communication: HOH  Cognition Arousal/Alertness: Awake/alert Behavior During Therapy: WFL for tasks assessed/performed Overall Cognitive Status: Within Functional Limits for tasks assessed                                 General Comments: daughter was also present in room        General Comments General comments (skin integrity, edema, etc.): B LE edema, R distal LE weeping and noted skin breakdown on L buttock, nursing staff made aware    Exercises     Assessment/Plan    PT Assessment Patient needs continued PT services  PT Problem List Decreased strength;Decreased activity tolerance;Decreased balance;Decreased mobility;Decreased cognition;Decreased coordination;Pain       PT Treatment Interventions DME instruction;Gait training;Functional mobility training;Therapeutic activities;Therapeutic exercise;Balance training;Neuromuscular re-education;Cognitive remediation;Patient/family education    PT Goals (Current  goals can be found in the Care Plan section)  Acute Rehab PT Goals PT Goal Formulation: With patient/family Time For Goal Achievement: 10/19/22 Potential to Achieve Goals: Good    Frequency Min 3X/week     Co-evaluation               AM-PAC PT "6 Clicks" Mobility  Outcome Measure Help needed turning from your back to your side while in a flat bed without using bedrails?: A Little Help needed moving from lying on your back to sitting on the side of a flat bed without using bedrails?: A Little Help needed moving to and from a bed to a chair (including a wheelchair)?: A Little Help needed standing up from a chair using your arms (e.g., wheelchair or bedside chair)?: A Little Help needed to walk in hospital room?: A Little Help needed climbing 3-5 steps with a railing? : Total 6 Click Score: 16    End of Session Equipment Utilized During Treatment: Gait belt Activity Tolerance: No increased pain;Patient tolerated treatment well Patient left: in chair;with call bell/phone within reach;with family/visitor present Nurse Communication: Mobility status;Other (comment) (concerns with skin integrity) PT Visit Diagnosis: Unsteadiness on feet (R26.81);Other abnormalities of gait and mobility (  R26.89);Muscle weakness (generalized) (M62.81);History of falling (Z91.81);Difficulty in walking, not elsewhere classified (R26.2);Pain Pain - Right/Left: Left Pain - part of body: Leg    Time: [id number] PT Time Calculation (min) (ACUTE ONLY): 39 min   Charges:   PT Evaluation $PT Eval Low Complexity: 1 Low PT Treatments $Gait Training: 8-22 mins $Therapeutic Activity: 8-22 mins        Baird Lyons, PT   Adair Patter 10/05/2022, 1:57 PM

## 2022-10-05 NOTE — Hospital Course (Signed)
Mrs. Gazzola is an 89 y.o. F with CKD IV baseline 2.5, lymphededema, Afib on Eliquis HTN, dCHF, and obesity who presented with 1 week cough and progressive weakness.

## 2022-10-05 NOTE — Progress Notes (Signed)
  Progress Note   Patient: Tabitha Black [id number] DOB: 09-Jun-1933 DOA: 10/03/2022     1 DOS: the patient was seen and examined on 10/05/2022        Brief hospital course: Tabitha Black is an 89 y.o. F with CKD IV baseline 2.5, lymphededema, Afib on Eliquis HTN, dCHF, and obesity who presented with 1 week cough and progressive weakness.      Assessment and Plan: * RSV infection - Anti-tussives - Acetaminophen    DM2 (diabetes mellitus, type 2) ([place]) Glucose controlled - Continue SS corrections  A-fib (HCC) - Continue Eliquis and Troprol   HTN (hypertension) - Continue Imdur new - Continue Cardura and Toprol - Hold nifedipine   Morbid obesity (HCC) BMI 35.7  Chronic kidney disease, stage 4 (severe) (HCC) Cr stable in baseline range [id number]  Chronic diastolic CHF (congestive heart failure) (HCC) Not in active CHF. - Hold nifedipine - Continue new Imdur - Continue Toprol and Cardura  Age-related physical debility            Subjective: Tired, headache, unable to get up, poor oral intake, very weak overall.     Physical Exam: BP (!) 148/50 (BP Location: Right Arm)   Pulse 89   Temp 98.1 F (36.7 C) (Oral)   Resp 16   Ht 5\' 3"  (1.6 m)   Wt 90.5 kg   SpO2 97%   BMI 35.34 kg/m   Elderly adult female, lying in bed, no acute distress, appears very tired and debilitated RRR, no murmurs, no peripheral edema Respiratory rate normal, lungs clear without rales or wheezes Abdomen soft without tenderness to palpation Attention normal, affect blunted, no confusion     Data Reviewed: Hemoglobin down to 8.6, white blood cells and platelets normal Creatinine 2.3  Family Communication: None present      Disposition: Status is: Inpatient         Author: Edwin Dada, MD 10/05/2022 2:17 PM  For on call review [email or web address].

## 2022-10-05 NOTE — Consult Note (Signed)
Consultation Note Date: 10/05/2022   Patient Name: Tabitha Black  DOB: [birth year]  MRN: [id number]  Age / Sex: 89 y.o., female  PCP: Collene Leyden, MD Referring Physician: Edwin Dada, *  Reason for Consultation: Establishing goals of care  HPI/Patient Profile: 89 y.o. female  admitted on 10/03/2022.   Clinical Assessment and Goals of Care: 89 year old lady who lives at home by herself has stage IV CKD baseline, has lymphedema, atrial fibrillation, hypertension, diastolic CHF and obesity.  Patient had cough and weakness and was brought in to the hospital was found to have RSV infection.  Remains admitted to hospital medicine service.  Palliative consult for goals of care discussions has been requested. Chart reviewed, patient seen and examined, discussed with patient and daughter present at bedside patient has advance care planning documents completed and has a MOST form indicating preferences for full code full scope of treatment. Palliative medicine is specialized medical care for people living with serious illness. It focuses on providing relief from the symptoms and stress of a serious illness. The goal is to improve quality of life for both the patient and the family. Goals of care: Broad aims of medical therapy in relation to the patient's values and preferences. Our aim is to provide medical care aimed at enabling patients to achieve the goals that matter most to them, given the circumstances of their particular medical situation and their constraints.   Goals of care discussions undertaken, goals wishes and values attempted to be explored.  Patient and daughter stated that the patient remains hopeful for " bouncing back".  She was independent up until very recently.  She is able to live by herself cook her meals and her sister lives next door.  Patient's daughter is her healthcare power of attorney  agent.  She recently got out of [facility] rehab facility and was to have base PT OT services started when she had to come into the hospital for RSV.  See below.  HCPOA  Daughter Chad Cordial.   SUMMARY OF RECOMMENDATIONS   Full code/full scope, MOST form a present on the chart reviewed, discussed with patient and daughter Home with home health home-based PT and OT as per patient and daughter's wishes. Consider outpatient palliative care after home-based PT OT trial is done. Thank you for the consult.  Code Status/Advance Care Planning: Full code   Symptom Management:     Palliative Prophylaxis:  Bowel Regimen  Additional Recommendations (Limitations, Scope, Preferences): Full Scope Treatment  Psycho-social/Spiritual:  Desire for further Chaplaincy support:yes Additional Recommendations: Caregiving  Support/Resources  Prognosis:  Unable to determine  Discharge Planning: Home with Home Health      Primary Diagnoses: Present on Admission:  RSV infection  Chronic diastolic CHF (congestive heart failure) (HCC)  Chronic kidney disease, stage 4 (severe) (HCC)  HTN (hypertension)  A-fib (HCC)  Morbid obesity ([place])  Age-related physical debility  RSV (respiratory syncytial virus infection)   I have reviewed the medical record, interviewed the patient and family, and examined the patient.  The following aspects are pertinent.  Past Medical History:  Diagnosis Date   ABLA (acute blood loss anemia) 08/23/2022   CKD (chronic kidney disease)    Diabetes mellitus without complication (HCC)    type II   Gastrointestinal hemorrhage with melena 08/23/2022   GERD (gastroesophageal reflux disease)    High cholesterol    Hypertension    Osteoarthritis    Skin cancer, basal cell    Social History   Socioeconomic History   Marital status: Married    Spouse name: Not on file   Number of children: Not on file   Years of education: Not on file   Highest education level: Not on  file  Occupational History   Not on file  Tobacco Use   Smoking status: Never   Smokeless tobacco: Never  Substance and Sexual Activity   Alcohol use: Yes    Comment: twice a year   Drug use: No   Sexual activity: Not on file  Other Topics Concern   Not on file  Social History Narrative   Not on file   Social Determinants of Health   Financial Resource Strain: Not on file  Food Insecurity: No Food Insecurity (10/04/2022)   Hunger Vital Sign    Worried About Running Out of Food in the Last Year: Never true    Ran Out of Food in the Last Year: Never true  Transportation Needs: No Transportation Needs (10/04/2022)   PRAPARE - Hydrologist (Medical): No    Lack of Transportation (Non-Medical): No  Physical Activity: Not on file  Stress: Not on file  Social Connections: Not on file   Family History  Problem Relation Age of Onset   Heart failure Mother    Hypertension Mother    Heart attack Father    Prostate cancer Father    Breast cancer Sister    Scheduled Meds:  apixaban  2.5 mg Oral BID   doxazosin  4 mg Oral Daily   gemfibrozil  600 mg Oral QAC breakfast   isosorbide mononitrate  15 mg Oral QHS   metoprolol succinate  100 mg Oral QPM   Continuous Infusions: PRN Meds:.acetaminophen **OR** acetaminophen, benzonatate, ondansetron **OR** ondansetron (ZOFRAN) IV Medications Prior to Admission:  Prior to Admission medications   Medication Sig Start Date End Date Taking? Authorizing Provider  acetaminophen (TYLENOL) 325 MG tablet Take 2 tablets (650 mg total) by mouth every 6 (six) hours as needed for mild pain (or Fever >/= 101). 08/27/22  Yes Ghimire, Henreitta Leber, MD  apixaban (ELIQUIS) 2.5 MG TABS tablet Take 1 tablet (2.5 mg total) by mouth 2 (two) times daily. 06/08/22  Yes Josue Hector, MD  benzonatate (TESSALON) 200 MG capsule Take 200 mg by mouth 3 (three) times daily as needed for cough. 10/01/22  Yes [provider]   Cholecalciferol (VITAMIN D3) 125 MCG (5000 UT) TABS Take 5,000 Units by mouth daily.   Yes [provider]  cyclobenzaprine (FLEXERIL) 10 MG tablet Take 5 mg by mouth daily as needed for muscle spasms.   Yes [provider]  doxazosin (CARDURA) 4 MG tablet Take 4 mg by mouth daily.   Yes [provider]  gemfibrozil (LOPID) 600 MG tablet Take 600 mg by mouth daily. 05/08/20  Yes [provider]  Iron-FA-B Cmp-C-Biot-Probiotic (FUSION PLUS) CAPS Take 1 capsule by mouth daily. 04/19/20  Yes [provider]  metoprolol succinate (TOPROL-XL) 100 MG 24 hr tablet Take 100  mg by mouth every evening. Take with or immediately following a meal.   Yes [provider]  Multiple Vitamins-Minerals (HAIR SKIN AND NAILS FORMULA) TABS Take 1 tablet by mouth daily.   Yes [provider]  NIFEdipine (PROCARDIA XL/NIFEDICAL XL) 60 MG 24 hr tablet Take 60 mg by mouth daily. 08/12/21  Yes [provider]  torsemide (DEMADEX) 20 MG tablet Take 20 mg by mouth 2 (two) times daily. 09/11/22  Yes [provider]  pantoprazole (PROTONIX) 40 MG tablet Take 1 tablet (40 mg total) by mouth 2 (two) times daily. Take twice daily for 6 weeks, and then switch to once daily dosing Patient not taking: Reported on 10/04/2022 08/27/22 08/27/23  Jonetta Osgood, MD   Allergies  Allergen Reactions   Other Swelling    Topical mycin, unsure of which one, caused opthalmic swelling  Erythromycin    Penicillins Itching, Other (See Comments), Rash, Shortness Of Breath and Swelling   Lovastatin Other (See Comments)    Weakness and myalgias, maybe to statins?   Azithromycin     Patient doesn't remember having a reaction to this.   Clindamycin     Patient doesn't remember having a reaction to this.    Hydralazine     Other reaction(s): felt foggy   Lisinopril Other (See Comments)    Hyperkalemia    Neomycin Sulfate [Neomycin] Other (See Comments)    Redness  and burning in face   Niacin Hives    Facial swelling and redness   Rofecoxib Other (See Comments)    Unknown reaction   Tetracycline    Valsartan Other (See Comments)    Hyperkalemia    Vytorin [Ezetimibe-Simvastatin] Other (See Comments)    myalgias   Zetia [Ezetimibe] Other (See Comments)    Leg pain   Review of Systems Complains of pain and weakness in her legs. Physical Exam Has bilateral lower extremity edema Appears weak, is able to ambulate with assistance of a walker from bathroom to chair. Regular work of breathing Awake alert oriented Abdomen not distended  Vital Signs: BP (!) 148/50 (BP Location: Right Arm)   Pulse 89   Temp 98.1 F (36.7 C) (Oral)   Resp 16   Ht 5\' 3"  (1.6 m)   Wt 90.5 kg   SpO2 97%   BMI 35.34 kg/m  Pain Scale: 0-10   Pain Score: 0-No pain   SpO2: SpO2: 97 % O2 Device:SpO2: 97 % O2 Flow Rate: .   IO: Intake/output summary:  Intake/Output Summary (Last 24 hours) at 10/05/2022 1339 Last data filed at 10/05/2022 [id number] Gross per 24 hour  Intake 60 ml  Output 950 ml  Net -890 ml    LBM: Last BM Date : 10/03/22 Baseline Weight: Weight: 91.6 kg Most recent weight: Weight: 90.5 kg     Palliative Assessment/Data:   PPS 60%  Time In:  12.30 Time Out:  1330 Time Total:  60  Greater than 50%  of this time was spent counseling and coordinating care related to the above assessment and plan.  Signed by: Loistine Chance, MD   Please contact Palliative Medicine Team phone at [phone] for questions and concerns.  For individual provider: See Shea Evans

## 2022-10-06 ENCOUNTER — Other Ambulatory Visit (HOSPITAL_COMMUNITY): Payer: Self-pay

## 2022-10-06 ENCOUNTER — Encounter (HOSPITAL_COMMUNITY): Payer: Medicare Other

## 2022-10-06 ENCOUNTER — Encounter (HOSPITAL_COMMUNITY): Payer: Self-pay

## 2022-10-06 DIAGNOSIS — B338 Other specified viral diseases: Secondary | ICD-10-CM | POA: Diagnosis not present

## 2022-10-06 LAB — BASIC METABOLIC PANEL
Anion gap: 10 (ref 5–15)
BUN: 46 mg/dL — ABNORMAL HIGH (ref 8–23)
CO2: 22 mmol/L (ref 22–32)
Calcium: 8.7 mg/dL — ABNORMAL LOW (ref 8.9–10.3)
Chloride: 106 mmol/L (ref 98–111)
Creatinine, Ser: 2.09 mg/dL — ABNORMAL HIGH (ref 0.44–1.00)
GFR, Estimated: 23 mL/min — ABNORMAL LOW (ref >60)
Glucose, Bld: 110 mg/dL — ABNORMAL HIGH (ref 70–99)
Potassium: 3.2 mmol/L — ABNORMAL LOW (ref 3.5–5.1)
Sodium: 138 mmol/L (ref 135–145)

## 2022-10-06 LAB — CBC
HCT: 26.5 % — ABNORMAL LOW (ref 36.0–46.0)
Hemoglobin: 8.2 g/dL — ABNORMAL LOW (ref 12.0–15.0)
MCH: 29.8 pg (ref 26.0–34.0)
MCHC: 30.9 g/dL (ref 30.0–36.0)
MCV: 96.4 fL (ref 80.0–100.0)
Platelets: 236 10*3/uL (ref 150–400)
RBC: 2.75 MIL/uL — ABNORMAL LOW (ref 3.87–5.11)
RDW: 16.2 % — ABNORMAL HIGH (ref 11.5–15.5)
WBC: 6.5 10*3/uL (ref 4.0–10.5)
nRBC: 0 % (ref 0.0–0.2)

## 2022-10-06 MED ORDER — POTASSIUM CHLORIDE CRYS ER 20 MEQ PO TBCR
20.0000 meq | EXTENDED_RELEASE_TABLET | Freq: Every day | ORAL | 3 refills | Status: DC
  Filled 2022-10-06: qty 30, 30d supply, fill #0

## 2022-10-06 MED ORDER — LOPERAMIDE HCL 2 MG PO CAPS: 2.0000 mg | ORAL_CAPSULE | ORAL | Status: DC | PRN

## 2022-10-06 MED ORDER — ISOSORBIDE MONONITRATE ER 30 MG PO TB24
15.0000 mg | ORAL_TABLET | Freq: Every day | ORAL | 3 refills | Status: AC
  Filled 2022-10-06: qty 15, 30d supply, fill #0

## 2022-10-06 MED ORDER — POTASSIUM CHLORIDE CRYS ER 20 MEQ PO TBCR
40.0000 meq | EXTENDED_RELEASE_TABLET | Freq: Two times a day (BID) | ORAL | Status: DC
  Administered 2022-10-06: 40 meq via ORAL

## 2022-10-06 MED ORDER — GUAIFENESIN 200 MG PO TABS: 200.0000 mg | ORAL_TABLET | ORAL | 0 refills | Status: DC | PRN

## 2022-10-06 MED ORDER — BENZONATATE 200 MG PO CAPS
200.0000 mg | ORAL_CAPSULE | Freq: Three times a day (TID) | ORAL | 0 refills | Status: DC | PRN
  Filled 2022-10-06: qty 20, 7d supply, fill #0

## 2022-10-06 NOTE — Progress Notes (Signed)
Physical Therapy Treatment Patient Details Name: Tabitha Black MRN: [id number] DOB: [birth year] Today's Date: 10/06/2022   History of Present Illness Patient is a 89 year old female who presented on 3/17 with generalized weakness, SOB with pt requiring 2 L/min at arrival to ED, cough and B LE weeping edema. Patient was admitted with RSV. PMH: CKD, a fib, HTN, CHF, obesity.    PT Comments     Pt admitted with above diagnosis.  Pt currently with functional limitations due to the deficits listed below (see PT Problem List). Pt seated in recliner when PT arrived, daughter present. Daughter indicated pt may transition home with [facility] today, daughter reported she would stay with pt until the end of the week. Pt has continued B LE edema and weeping and states decreased B proximal mm pain. Pt has productive cough t/o tx session, no reports of SOB or dizziness. PT developed written and reviewed HEP HO with exercises as below pt able to complete HEP with minimal cues and daughter indicated she will assist. Pt required min A for STS from recliner and demonstrated improved gait tolerance of 98 feet in hallway with RW and min guard. Pt left seated in recliner, all needs met and daughter present.   Pt will benefit from skilled PT to increase their independence and safety with mobility to allow discharge to the venue listed below.     Recommendations for follow up therapy are one component of a multi-disciplinary discharge planning process, led by the attending physician.  Recommendations may be updated based on patient status, additional functional criteria and insurance authorization.  Follow Up Recommendations  Home health PT     Assistance Recommended at Discharge Frequent or constant Supervision/Assistance  Patient can return home with the following A little help with walking and/or transfers;A little help with bathing/dressing/bathroom;Assistance with cooking/housework;Assist for transportation;Help with  stairs or ramp for entrance   Equipment Recommendations  None recommended by PT    Recommendations for Other Services       Precautions / Restrictions Precautions Precautions: Fall Precaution Comments: monitor vitals, lymphedema BLE, has shoes in room Restrictions Weight Bearing Restrictions: No     Mobility  Bed Mobility               General bed mobility comments: in recliner when PT arrived    Transfers Overall transfer level: Needs assistance Equipment used: Rolling walker (2 wheels) Transfers: Sit to/from Stand Sit to Stand: Min assist           General transfer comment: pt indicates mm weakness and pain B proximal LEs due to attempting to get out of glider rocking chair for 6 hrs on 3/17,    Ambulation/Gait Ambulation/Gait assistance: Min guard Gait Distance (Feet): 98 Feet Assistive device: Rolling walker (2 wheels) Gait Pattern/deviations: Step-to pattern, Shuffle Gait velocity: decreased     General Gait Details: flexed postures   Stairs             Wheelchair Mobility    Modified Rankin (Stroke Patients Only)       Balance Overall balance assessment: Needs assistance Sitting-balance support: Feet supported Sitting balance-Leahy Scale: Fair     Standing balance support: Reliant on assistive device for balance, During functional activity Standing balance-Leahy Scale: Poor                              Cognition Arousal/Alertness: Awake/alert Behavior During Therapy: WFL for tasks assessed/performed Overall  Cognitive Status: Within Functional Limits for tasks assessed                                 General Comments: daughter was also present in room        Exercises General Exercises - Upper Extremity Shoulder Flexion: AROM, Both, 10 reps Shoulder Extension: AROM, Both, 10 reps Shoulder ABduction: AROM, Both, 10 reps Shoulder Horizontal ABduction: AROM, Both, 10 reps Shoulder Horizontal  ADduction: AROM, Both, 10 reps Elbow Flexion: AROM, Both, 10 reps Elbow Extension: AROM, Both, 10 reps Chair Push Up: AAROM, Both, 5 reps General Exercises - Lower Extremity Ankle Circles/Pumps: AROM, Both, 10 reps Long Arc Quad: AROM, Both, 10 reps, Seated Hip ABduction/ADduction: AROM, Both, 10 reps, Seated Hip Flexion/Marching: AROM, Both, 10 reps, Seated    General Comments General comments (skin integrity, edema, etc.): B LE edema      Pertinent Vitals/Pain Pain Assessment Pain Assessment: 0-10 Pain Score: 5  Pain Location: B LEs Pain Descriptors / Indicators: Constant, Heaviness, Tender Pain Intervention(s): Monitored during session, Limited activity within patient's tolerance    Home Living Family/patient expects to be discharged to:: Private residence Living Arrangements: Alone Available Help at Discharge: Family;Available PRN/intermittently Type of Home: House Home Access: Ramped entrance       Home Layout: One level Home Equipment: Toilet riser;Shower seat;Grab bars - toilet;Grab bars - tub/shower;Other (comment) Additional Comments: does not wear O2 at home, sleeps in lift chair    Prior Function            PT Goals (current goals can now be found in the care plan section) Acute Rehab PT Goals PT Goal Formulation: With patient/family Time For Goal Achievement: 10/19/22 Potential to Achieve Goals: Good    Frequency    Min 3X/week      PT Plan      Co-evaluation              AM-PAC PT "6 Clicks" Mobility   Outcome Measure  Help needed turning from your back to your side while in a flat bed without using bedrails?: A Little Help needed moving from lying on your back to sitting on the side of a flat bed without using bedrails?: A Little Help needed moving to and from a bed to a chair (including a wheelchair)?: A Little Help needed standing up from a chair using your arms (e.g., wheelchair or bedside chair)?: A Little Help needed to walk in  hospital room?: A Little Help needed climbing 3-5 steps with a railing? : Total 6 Click Score: 16    End of Session Equipment Utilized During Treatment: Gait belt Activity Tolerance: No increased pain;Patient tolerated treatment well Patient left: in chair;with call bell/phone within reach;with family/visitor present Nurse Communication: Mobility status;Other (comment) (concerns with skin integrity) PT Visit Diagnosis: Unsteadiness on feet (R26.81);Other abnormalities of gait and mobility (R26.89);Muscle weakness (generalized) (M62.81);History of falling (Z91.81);Difficulty in walking, not elsewhere classified (R26.2);Pain Pain - Right/Left: Right Pain - part of body: Leg     Time: [id number] PT Time Calculation (min) (ACUTE ONLY): 29 min  Charges:  $Gait Training: 8-22 mins $Therapeutic Exercise: 8-22 mins                     Baird Lyons, PT    Adair Patter 10/06/2022, 12:22 PM

## 2022-10-06 NOTE — Discharge Summary (Addendum)
Physician Discharge Summary   Patient: Tabitha Black MRN: [id number] DOB: 08/21/1932  Admit date:     10/03/2022  Discharge date: 10/06/22  Discharge Physician: Edwin Dada   PCP: Collene Leyden, MD     Recommendations at discharge:  Follow upw with PCP Dr. Esmeralda Links in 2 days Dr. Esmeralda Links: Please recheck Hgb Please check iron stores  Follow up with Nephrology, Dr. Posey Pronto in 1 week Dr. Posey Pronto: Please confirm diuretic regimen, patient and daughter reports being off torsemide and taking furosemide 40? Mg daily     Discharge Diagnoses: Principal Problem:   RSV infection Active Problems:   Age-related physical debility   Chronic diastolic CHF (congestive heart failure) (HCC)   Chronic kidney disease, stage 4 (severe) (HCC)   Morbid obesity (HCC)   HTN (hypertension)   A-fib (HCC)   DM2 (diabetes mellitus, type 2) (HCC)   Lymphedema of both lower extremities - R > L      Hospital Course: Tabitha Black is an 89 y.o. F with CKD IV baseline 2.5, lymphededema, Afib on Eliquis HTN, dCHF, and obesity who presented with 1 week cough and progressive weakness.      * RSV infection Admitted due to weakness, inability to ambulate.    Treated with supportive cares, anti-tussives, expectorants, improved.    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  Here, her Hgb trended down during stay, but was within ~1 g/dL of her recent baseline, and without any signs of clinical bleeding.  However, she is high risk for recurrent anemia.               The [place] Controlled Substances Registry was reviewed for this patient prior to discharge.   Consultants: None Procedures performed: None  Disposition: Home health Diet recommendation:  Cardiac and Carb modified diet  DISCHARGE MEDICATION: Allergies as of 10/06/2022       Reactions   Other Swelling   Topical  mycin, unsure of which one, caused opthalmic swelling Erythromycin    Penicillins Itching, Other (See Comments), Rash, Shortness Of Breath, Swelling   Lovastatin Other (See Comments)   Weakness and myalgias, maybe to statins?   Azithromycin    Patient doesn't remember having a reaction to this.   Clindamycin    Patient doesn't remember having a reaction to this.   Hydralazine    Other reaction(s): felt foggy   Lisinopril Other (See Comments)   Hyperkalemia   Neomycin Sulfate [neomycin] Other (See Comments)   Redness and burning in face   Niacin Hives   Facial swelling and redness   Rofecoxib Other (See Comments)   Unknown reaction   Tetracycline    Valsartan Other (See Comments)   Hyperkalemia   Vytorin [ezetimibe-simvastatin] Other (See Comments)   myalgias   Zetia [ezetimibe] Other (See Comments)   Leg pain        Medication List     STOP taking these medications    NIFEdipine 60 MG 24 hr tablet Commonly known as: PROCARDIA XL/NIFEDICAL XL       TAKE these medications    acetaminophen 325 MG tablet Commonly known as: TYLENOL Take 2 tablets (650 mg total) by mouth every 6 (six) hours as needed for mild pain (or Fever >/= 101).   apixaban 2.5 MG Tabs tablet Commonly known as: ELIQUIS Take 1 tablet (2.5 mg total) by mouth 2 (two) times daily.  benzonatate 200 MG capsule Commonly known as: TESSALON Take 1 capsule (200 mg) by mouth 3 times daily as needed for cough.   cyclobenzaprine 10 MG tablet Commonly known as: FLEXERIL Take 5 mg by mouth daily as needed for muscle spasms.   doxazosin 4 MG tablet Commonly known as: CARDURA Take 4 mg by mouth daily.   Fusion Plus Caps Take 1 capsule by mouth daily.   gemfibrozil 600 MG tablet Commonly known as: LOPID Take 600 mg by mouth daily.   guaiFENesin 200 MG tablet Take 1 tablet (200 mg total) by mouth every 4 (four) hours as needed for cough or to loosen phlegm.   Hair Skin and Nails Formula Tabs Take  1 tablet by mouth daily.   isosorbide mononitrate 30 MG 24 hr tablet Commonly known as: IMDUR Take 1/2 tablet (15 mg) by mouth at bedtime.   metoprolol succinate 100 MG 24 hr tablet Commonly known as: TOPROL-XL Take 100 mg by mouth every evening. Take with or immediately following a meal.   potassium chloride SA 20 MEQ tablet Commonly known as: KLOR-CON M Take 1 tablet (20 mEq) by mouth daily.   furosemide ? MG tablet Take by mouth 2 (two) times daily.   Vitamin D3 125 MCG (5000 UT) Tabs Generic drug: Cholecalciferol Take 5,000 Units by mouth daily.               Discharge Care Instructions  (From admission, onward)           Start     Ordered   10/06/22 0000  Discharge wound care:       Comments: Remove stockings to both legs and inspect skin daily. Apply foam dressing to blistered open areas on legs.  If no foam dressing available, over the counter non-stick dressings are okay.  Change dressings every three days or if soiled Follow up with Lymphedema clinic.   10/06/22 [id number]            Follow-up Information     Collene Leyden, MD Follow up.   Specialty: Family Medicine Contact information: [address] [place] [address] [phone]                 Discharge Instructions     Discharge instructions   Complete by: As directed    **IMPORTANT DISCHARGE INSTRUCTIONS**   From Dr. Loleta Books: You were admitted for RSV  Here, you were treated with cough medicines and expectorants (medicines/devices to loosen phlegm) and you appeared stable  You should resume your home medicines with one exception: Nifedipine is associated with leg swelling.  Because you already have an issue with leg swelling, we recommend replacing this with isosorbide mononitrate/Imdur  Stop nifedipine Start Imdur for blood pressure  Go back to the Lymphedema clinic for management of the chronic leg swelling See below for how to manage the ulcers on the legs     For the cough: Tessalon perles is a pill cough suppressant Use it at night or if you are having an excessive cough during the day  Guaifenesin is an expectorant (makes phlegm "looser") Find a Mucinex product or generic guaifenesin equivalent at the pharmacy that has only that ingredient (no other ingredients besides the guaifenesin) Take 200 mg (over the counter) up to 3 times daily as needed to loosen phlegm  The flutter and incentive spirometer also loosen phlegm, so use them too!  Go see Dr. Esmeralda Links this week  Have him check your labs  Take potassium daily until you see him and ask him if you should continue   Discharge wound care:   Complete by: As directed    Remove stockings to both legs and inspect skin daily. Apply foam dressing to blistered open areas on legs.  If no foam dressing available, over the counter non-stick dressings are okay.  Change dressings every three days or if soiled Follow up with Lymphedema clinic.   Increase activity slowly   Complete by: As directed        Discharge Exam: Filed Weights   10/03/22 2224 10/05/22 0618 10/06/22 0500  Weight: 91.6 kg 90.5 kg 78.8 kg    General: Pt is alert, awake, not in acute distress, sitting on edge of bed, eating breakfast, frequent cough Cardiovascular: RRR, nl S1-S2, no murmurs appreciated.   Chronic LE edema, unchagned from prior.   Respiratory: Normal respiratory rate and rhythm.  CTAB without rales or wheezes. Abdominal: Abdomen soft and non-tender.  No distension or HSM.   Neuro/Psych: Strength symmetric in upper and lower extremities.  Judgment and insight appear normal.   Condition at discharge: stable  The results of significant diagnostics from this hospitalization (including imaging, microbiology, ancillary and laboratory) are listed below for reference.   Imaging Studies: DG Chest Port 1 View  Result Date: 10/03/2022 CLINICAL DATA:  Lower extremity weakness EXAM: PORTABLE CHEST 1 VIEW  COMPARISON:  12/06/2016 FINDINGS: Cardiomegaly with possible trace pleural effusions. Patchy atelectasis or pneumonia at left base. Aortic atherosclerosis. No pneumothorax IMPRESSION: Cardiomegaly with suspected trace pleural effusions. Patchy atelectasis or pneumonia at the left base. Electronically Signed   By: Donavan Foil M.D.   On: 10/03/2022 23:18    Microbiology: Results for orders placed or performed during the hospital encounter of 10/03/22  Blood Culture (routine x 2)     Status: None (Preliminary result)   Collection Time: 10/03/22 10:40 PM   Specimen: BLOOD  Result Value Ref Range Status   Specimen Description   Final    BLOOD RIGHT ANTECUBITAL Performed at [facility] [address], [place], [place] [address]    Special Requests   Final    BOTTLES DRAWN AEROBIC AND ANAEROBIC Blood Culture results may not be optimal due to an inadequate volume of blood received in culture bottles Performed at [facility] [address], [place], [place] [address]    Culture   Final    NO GROWTH 2 DAYS Performed at [facility] [address], [place], [place] [address]    Report Status PENDING  Incomplete  Blood Culture (routine x 2)     Status: None (Preliminary result)   Collection Time: 10/03/22 10:47 PM   Specimen: BLOOD  Result Value Ref Range Status   Specimen Description   Final    BLOOD LEFT ANTECUBITAL Performed at [facility] [address] [place], [place] [address]    Special Requests   Final    BOTTLES DRAWN AEROBIC AND ANAEROBIC Blood Culture adequate volume Performed at [facility] [address], [place], [place] [address]    Culture   Final    NO GROWTH 2 DAYS Performed at [facility] [address] [place],  [address]    Report Status PENDING  Incomplete  Resp panel by RT-PCR (RSV, Flu A&B, Covid)     Status: Abnormal   Collection Time: 10/03/22 10:47 PM    Specimen: Nasal Swab  Result Value Ref  Range Status   SARS Coronavirus 2 by RT PCR NEGATIVE NEGATIVE Final    Comment: (NOTE) SARS-CoV-2 target nucleic acids are NOT DETECTED.  The SARS-CoV-2 RNA is generally detectable in upper respiratory specimens during the acute phase of infection. The lowest concentration of SARS-CoV-2 viral copies this assay can detect is 138 copies/mL. A negative result does not preclude SARS-Cov-2 infection and should not be used as the sole basis for treatment or other patient management decisions. A negative result may occur with  improper specimen collection/handling, submission of specimen other than nasopharyngeal swab, presence of viral mutation(s) within the areas targeted by this assay, and inadequate number of viral copies(<138 copies/mL). A negative result must be combined with clinical observations, patient history, and epidemiological information. The expected result is Negative.  Fact Sheet for Patients:  EntrepreneurPulse.com.au  Fact Sheet for Healthcare Providers:  IncredibleEmployment.be  This test is no t yet approved or cleared by the [place] FDA and  has been authorized for detection and/or diagnosis of SARS-CoV-2 by FDA under an Emergency Use Authorization (EUA). This EUA will remain  in effect (meaning this test can be used) for the duration of the COVID-19 declaration under Section 564(b)(1) of the Act, 21 U.S.C.section 360bbb-3(b)(1), unless the authorization is terminated  or revoked sooner.       Influenza A by PCR NEGATIVE NEGATIVE Final   Influenza B by PCR NEGATIVE NEGATIVE Final    Comment: (NOTE) The Xpert Xpress SARS-CoV-2/FLU/RSV plus assay is intended as an aid in the diagnosis of influenza from Nasopharyngeal swab specimens and should not be used as a sole basis for treatment. Nasal washings and aspirates are unacceptable for Xpert Xpress SARS-CoV-2/FLU/RSV testing.  Fact  Sheet for Patients: EntrepreneurPulse.com.au  Fact Sheet for Healthcare Providers: IncredibleEmployment.be  This test is not yet approved or cleared by the [place] FDA and has been authorized for detection and/or diagnosis of SARS-CoV-2 by FDA under an Emergency Use Authorization (EUA). This EUA will remain in effect (meaning this test can be used) for the duration of the COVID-19 declaration under Section 564(b)(1) of the Act, 21 U.S.C. section 360bbb-3(b)(1), unless the authorization is terminated or revoked.     Resp Syncytial Virus by PCR POSITIVE (A) NEGATIVE Final    Comment: (NOTE) Fact Sheet for Patients: EntrepreneurPulse.com.au  Fact Sheet for Healthcare Providers: IncredibleEmployment.be  This test is not yet approved or cleared by the [place] FDA and has been authorized for detection and/or diagnosis of SARS-CoV-2 by FDA under an Emergency Use Authorization (EUA). This EUA will remain in effect (meaning this test can be used) for the duration of the COVID-19 declaration under Section 564(b)(1) of the Act, 21 U.S.C. section 360bbb-3(b)(1), unless the authorization is terminated or revoked.  Performed at [facility], [place] [address], [place], [place] [address]     Labs: CBC: Recent Labs  Lab 10/03/22 2242 10/04/22 0343 10/05/22 0416 10/06/22 0348  WBC 8.1 7.8 8.6 6.5  NEUTROABS 6.7 5.9  --   --   HGB 10.2* 9.8* 8.6* 8.2*  HCT 32.9* 31.5* 27.9* 26.5*  MCV 95.6 97.8 96.9 96.4  PLT 232 223 239 [id number]   Basic Metabolic Panel: Recent Labs  Lab 10/03/22 2242 10/04/22 0343 10/05/22 0416 10/06/22 0348  NA 136 138 141 138  K 3.5 3.0* 3.5 3.2*  CL 103 104 106 106  CO2 22 20* 20* 22  GLUCOSE 134* 111* 89 110*  BUN 51* 48* 47* 46*  CREATININE 2.90* 2.65* 2.30*  2.09*  CALCIUM 8.9 8.9 8.9 8.7*  MG  --  1.8  --   --    Liver Function Tests: Recent Labs   Lab 10/03/22 2242 10/04/22 0343  AST 75* 63*  ALT 21 19  ALKPHOS 51 48  BILITOT 1.8* 1.5*  PROT 7.3 6.4*  ALBUMIN 3.6 3.2*   CBG: Recent Labs  Lab 10/04/22 0331 10/04/22 0820 10/04/22 1144 10/04/22 2124  GLUCAP 117* 108* 105* 93    Discharge time spent: approximately 35 minutes spent on discharge counseling, evaluation of patient on day of discharge, and coordination of discharge planning with nursing, social work, pharmacy and case management  Signed: Edwin Dada, MD [facility] 10/06/2022

## 2022-10-06 NOTE — Progress Notes (Signed)
Pharmacist Heart Failure Core Measure Documentation  Assessment: Tabitha Black has an EF documented as  chronic systolic heart failure, but no EF since 2018  Rationale: Heart failure patients with left ventricular systolic dysfunction (LVSD) and an EF < 40% should be prescribed an angiotensin converting enzyme inhibitor (ACEI) or angiotensin receptor blocker (ARB) at discharge unless a contraindication is documented in the medical record.  This patient is not currently on an ACEI or ARB for HF.  This note is being placed in the record in order to provide documentation that a contraindication to the use of these agents is present for this encounter.  ACE Inhibitor or Angiotensin Receptor Blocker is contraindicated (specify all that apply)  [x]   ACEI allergy AND ARB allergy--allergy to Valsartan,- Patient on Imdur, but allergic to hydralazine []   Angioedema []   Moderate or severe aortic stenosis []   Hyperkalemia []   Hypotension []   Renal artery stenosis [x]   Worsening renal function, preexisting renal disease or dysfunction    Brookes Craine S. Alford Highland, PharmD, BCPS Clinical Staff Pharmacist [email or web address] [facility] 10/06/2022 8:22 AM

## 2022-10-06 NOTE — Progress Notes (Signed)
PMT no charge note.   Patient noted to be resting in a chair, does not appear to be in acute distress, discussed with bedside nursing staff, chart reviewed.  Does not seem to have uncontrolled symptoms.  Possibly ready for discharge today.  As per previous goals of care discussions on 10-05-2022, at the time of initial palliative consultation, patient and HCPOA daughter's goals are for continuation of full code full scope status, patient has a completed MOST form on the chart indicating preferences for full code full scope care, and she is a to have a home-based PT OT.  No palliative specific recommendations at this time. Charge Loistine Chance, MD [facility] palliative team

## 2022-10-08 DIAGNOSIS — K219 Gastro-esophageal reflux disease without esophagitis: Secondary | ICD-10-CM | POA: Diagnosis not present

## 2022-10-08 DIAGNOSIS — I130 Hypertensive heart and chronic kidney disease with heart failure and stage 1 through stage 4 chronic kidney disease, or unspecified chronic kidney disease: Secondary | ICD-10-CM | POA: Diagnosis not present

## 2022-10-08 DIAGNOSIS — N184 Chronic kidney disease, stage 4 (severe): Secondary | ICD-10-CM | POA: Diagnosis not present

## 2022-10-08 DIAGNOSIS — Z8719 Personal history of other diseases of the digestive system: Secondary | ICD-10-CM | POA: Diagnosis not present

## 2022-10-08 DIAGNOSIS — J210 Acute bronchiolitis due to respiratory syncytial virus: Secondary | ICD-10-CM | POA: Diagnosis not present

## 2022-10-08 DIAGNOSIS — R600 Localized edema: Secondary | ICD-10-CM | POA: Diagnosis not present

## 2022-10-08 DIAGNOSIS — I5032 Chronic diastolic (congestive) heart failure: Secondary | ICD-10-CM | POA: Diagnosis not present

## 2022-10-08 DIAGNOSIS — D509 Iron deficiency anemia, unspecified: Secondary | ICD-10-CM | POA: Diagnosis not present

## 2022-10-09 DIAGNOSIS — I130 Hypertensive heart and chronic kidney disease with heart failure and stage 1 through stage 4 chronic kidney disease, or unspecified chronic kidney disease: Secondary | ICD-10-CM | POA: Diagnosis not present

## 2022-10-09 DIAGNOSIS — E1122 Type 2 diabetes mellitus with diabetic chronic kidney disease: Secondary | ICD-10-CM | POA: Diagnosis not present

## 2022-10-09 DIAGNOSIS — N184 Chronic kidney disease, stage 4 (severe): Secondary | ICD-10-CM | POA: Diagnosis not present

## 2022-10-09 DIAGNOSIS — I5032 Chronic diastolic (congestive) heart failure: Secondary | ICD-10-CM | POA: Diagnosis not present

## 2022-10-09 DIAGNOSIS — D62 Acute posthemorrhagic anemia: Secondary | ICD-10-CM | POA: Diagnosis not present

## 2022-10-09 DIAGNOSIS — K922 Gastrointestinal hemorrhage, unspecified: Secondary | ICD-10-CM | POA: Diagnosis not present

## 2022-10-09 LAB — CULTURE, BLOOD (ROUTINE X 2)
Culture: NO GROWTH
Culture: NO GROWTH
Special Requests: ADEQUATE

## 2022-10-12 DIAGNOSIS — N189 Chronic kidney disease, unspecified: Secondary | ICD-10-CM | POA: Diagnosis not present

## 2022-10-12 DIAGNOSIS — N2581 Secondary hyperparathyroidism of renal origin: Secondary | ICD-10-CM | POA: Diagnosis not present

## 2022-10-12 DIAGNOSIS — I5032 Chronic diastolic (congestive) heart failure: Secondary | ICD-10-CM | POA: Diagnosis not present

## 2022-10-12 DIAGNOSIS — D631 Anemia in chronic kidney disease: Secondary | ICD-10-CM | POA: Diagnosis not present

## 2022-10-12 DIAGNOSIS — N184 Chronic kidney disease, stage 4 (severe): Secondary | ICD-10-CM | POA: Diagnosis not present

## 2022-10-12 DIAGNOSIS — I130 Hypertensive heart and chronic kidney disease with heart failure and stage 1 through stage 4 chronic kidney disease, or unspecified chronic kidney disease: Secondary | ICD-10-CM | POA: Diagnosis not present

## 2022-10-12 DIAGNOSIS — E1122 Type 2 diabetes mellitus with diabetic chronic kidney disease: Secondary | ICD-10-CM | POA: Diagnosis not present

## 2022-10-12 DIAGNOSIS — D62 Acute posthemorrhagic anemia: Secondary | ICD-10-CM | POA: Diagnosis not present

## 2022-10-12 DIAGNOSIS — K922 Gastrointestinal hemorrhage, unspecified: Secondary | ICD-10-CM | POA: Diagnosis not present

## 2022-10-12 DIAGNOSIS — I129 Hypertensive chronic kidney disease with stage 1 through stage 4 chronic kidney disease, or unspecified chronic kidney disease: Secondary | ICD-10-CM | POA: Diagnosis not present

## 2022-10-13 DIAGNOSIS — K922 Gastrointestinal hemorrhage, unspecified: Secondary | ICD-10-CM | POA: Diagnosis not present

## 2022-10-13 DIAGNOSIS — I130 Hypertensive heart and chronic kidney disease with heart failure and stage 1 through stage 4 chronic kidney disease, or unspecified chronic kidney disease: Secondary | ICD-10-CM | POA: Diagnosis not present

## 2022-10-13 DIAGNOSIS — N184 Chronic kidney disease, stage 4 (severe): Secondary | ICD-10-CM | POA: Diagnosis not present

## 2022-10-13 DIAGNOSIS — D62 Acute posthemorrhagic anemia: Secondary | ICD-10-CM | POA: Diagnosis not present

## 2022-10-13 DIAGNOSIS — E1122 Type 2 diabetes mellitus with diabetic chronic kidney disease: Secondary | ICD-10-CM | POA: Diagnosis not present

## 2022-10-13 DIAGNOSIS — I5032 Chronic diastolic (congestive) heart failure: Secondary | ICD-10-CM | POA: Diagnosis not present

## 2022-10-14 DIAGNOSIS — H52203 Unspecified astigmatism, bilateral: Secondary | ICD-10-CM | POA: Diagnosis not present

## 2022-10-14 DIAGNOSIS — H2513 Age-related nuclear cataract, bilateral: Secondary | ICD-10-CM | POA: Diagnosis not present

## 2022-10-14 DIAGNOSIS — E119 Type 2 diabetes mellitus without complications: Secondary | ICD-10-CM | POA: Diagnosis not present

## 2022-10-18 DIAGNOSIS — N184 Chronic kidney disease, stage 4 (severe): Secondary | ICD-10-CM | POA: Diagnosis not present

## 2022-10-18 DIAGNOSIS — I130 Hypertensive heart and chronic kidney disease with heart failure and stage 1 through stage 4 chronic kidney disease, or unspecified chronic kidney disease: Secondary | ICD-10-CM | POA: Diagnosis not present

## 2022-10-18 DIAGNOSIS — I5032 Chronic diastolic (congestive) heart failure: Secondary | ICD-10-CM | POA: Diagnosis not present

## 2022-10-18 DIAGNOSIS — D62 Acute posthemorrhagic anemia: Secondary | ICD-10-CM | POA: Diagnosis not present

## 2022-10-18 DIAGNOSIS — E1122 Type 2 diabetes mellitus with diabetic chronic kidney disease: Secondary | ICD-10-CM | POA: Diagnosis not present

## 2022-10-18 DIAGNOSIS — K922 Gastrointestinal hemorrhage, unspecified: Secondary | ICD-10-CM | POA: Diagnosis not present

## 2022-10-20 ENCOUNTER — Encounter (HOSPITAL_COMMUNITY): Payer: Medicare Other

## 2022-10-20 DIAGNOSIS — N184 Chronic kidney disease, stage 4 (severe): Secondary | ICD-10-CM | POA: Diagnosis not present

## 2022-10-20 DIAGNOSIS — I5032 Chronic diastolic (congestive) heart failure: Secondary | ICD-10-CM | POA: Diagnosis not present

## 2022-10-20 DIAGNOSIS — E1122 Type 2 diabetes mellitus with diabetic chronic kidney disease: Secondary | ICD-10-CM | POA: Diagnosis not present

## 2022-10-20 DIAGNOSIS — I130 Hypertensive heart and chronic kidney disease with heart failure and stage 1 through stage 4 chronic kidney disease, or unspecified chronic kidney disease: Secondary | ICD-10-CM | POA: Diagnosis not present

## 2022-10-20 DIAGNOSIS — D62 Acute posthemorrhagic anemia: Secondary | ICD-10-CM | POA: Diagnosis not present

## 2022-10-20 DIAGNOSIS — K922 Gastrointestinal hemorrhage, unspecified: Secondary | ICD-10-CM | POA: Diagnosis not present

## 2022-10-22 DIAGNOSIS — I5032 Chronic diastolic (congestive) heart failure: Secondary | ICD-10-CM | POA: Diagnosis not present

## 2022-10-22 DIAGNOSIS — D62 Acute posthemorrhagic anemia: Secondary | ICD-10-CM | POA: Diagnosis not present

## 2022-10-22 DIAGNOSIS — E1122 Type 2 diabetes mellitus with diabetic chronic kidney disease: Secondary | ICD-10-CM | POA: Diagnosis not present

## 2022-10-22 DIAGNOSIS — I130 Hypertensive heart and chronic kidney disease with heart failure and stage 1 through stage 4 chronic kidney disease, or unspecified chronic kidney disease: Secondary | ICD-10-CM | POA: Diagnosis not present

## 2022-10-22 DIAGNOSIS — N184 Chronic kidney disease, stage 4 (severe): Secondary | ICD-10-CM | POA: Diagnosis not present

## 2022-10-22 DIAGNOSIS — K922 Gastrointestinal hemorrhage, unspecified: Secondary | ICD-10-CM | POA: Diagnosis not present

## 2022-10-23 DIAGNOSIS — N184 Chronic kidney disease, stage 4 (severe): Secondary | ICD-10-CM | POA: Diagnosis not present

## 2022-10-23 DIAGNOSIS — I130 Hypertensive heart and chronic kidney disease with heart failure and stage 1 through stage 4 chronic kidney disease, or unspecified chronic kidney disease: Secondary | ICD-10-CM | POA: Diagnosis not present

## 2022-10-23 DIAGNOSIS — M1990 Unspecified osteoarthritis, unspecified site: Secondary | ICD-10-CM | POA: Diagnosis not present

## 2022-10-23 DIAGNOSIS — Z792 Long term (current) use of antibiotics: Secondary | ICD-10-CM | POA: Diagnosis not present

## 2022-10-23 DIAGNOSIS — I482 Chronic atrial fibrillation, unspecified: Secondary | ICD-10-CM | POA: Diagnosis not present

## 2022-10-23 DIAGNOSIS — D631 Anemia in chronic kidney disease: Secondary | ICD-10-CM | POA: Diagnosis not present

## 2022-10-23 DIAGNOSIS — I890 Lymphedema, not elsewhere classified: Secondary | ICD-10-CM | POA: Diagnosis not present

## 2022-10-23 DIAGNOSIS — I872 Venous insufficiency (chronic) (peripheral): Secondary | ICD-10-CM | POA: Diagnosis not present

## 2022-10-23 DIAGNOSIS — M545 Low back pain, unspecified: Secondary | ICD-10-CM | POA: Diagnosis not present

## 2022-10-23 DIAGNOSIS — I5032 Chronic diastolic (congestive) heart failure: Secondary | ICD-10-CM | POA: Diagnosis not present

## 2022-10-23 DIAGNOSIS — H9193 Unspecified hearing loss, bilateral: Secondary | ICD-10-CM | POA: Diagnosis not present

## 2022-10-23 DIAGNOSIS — E1122 Type 2 diabetes mellitus with diabetic chronic kidney disease: Secondary | ICD-10-CM | POA: Diagnosis not present

## 2022-10-23 DIAGNOSIS — Z7901 Long term (current) use of anticoagulants: Secondary | ICD-10-CM | POA: Diagnosis not present

## 2022-10-23 DIAGNOSIS — E876 Hypokalemia: Secondary | ICD-10-CM | POA: Diagnosis not present

## 2022-10-23 DIAGNOSIS — K259 Gastric ulcer, unspecified as acute or chronic, without hemorrhage or perforation: Secondary | ICD-10-CM | POA: Diagnosis not present

## 2022-10-23 DIAGNOSIS — Z8619 Personal history of other infectious and parasitic diseases: Secondary | ICD-10-CM | POA: Diagnosis not present

## 2022-10-23 DIAGNOSIS — Z9049 Acquired absence of other specified parts of digestive tract: Secondary | ICD-10-CM | POA: Diagnosis not present

## 2022-10-23 DIAGNOSIS — Z6829 Body mass index (BMI) 29.0-29.9, adult: Secondary | ICD-10-CM | POA: Diagnosis not present

## 2022-10-23 DIAGNOSIS — E559 Vitamin D deficiency, unspecified: Secondary | ICD-10-CM | POA: Diagnosis not present

## 2022-10-23 DIAGNOSIS — K219 Gastro-esophageal reflux disease without esophagitis: Secondary | ICD-10-CM | POA: Diagnosis not present

## 2022-10-23 DIAGNOSIS — D62 Acute posthemorrhagic anemia: Secondary | ICD-10-CM | POA: Diagnosis not present

## 2022-10-23 DIAGNOSIS — E7849 Other hyperlipidemia: Secondary | ICD-10-CM | POA: Diagnosis not present

## 2022-10-23 DIAGNOSIS — E1151 Type 2 diabetes mellitus with diabetic peripheral angiopathy without gangrene: Secondary | ICD-10-CM | POA: Diagnosis not present

## 2022-10-23 DIAGNOSIS — G8929 Other chronic pain: Secondary | ICD-10-CM | POA: Diagnosis not present

## 2022-10-25 DIAGNOSIS — R630 Anorexia: Secondary | ICD-10-CM | POA: Diagnosis not present

## 2022-10-25 DIAGNOSIS — I130 Hypertensive heart and chronic kidney disease with heart failure and stage 1 through stage 4 chronic kidney disease, or unspecified chronic kidney disease: Secondary | ICD-10-CM | POA: Diagnosis not present

## 2022-10-25 DIAGNOSIS — I890 Lymphedema, not elsewhere classified: Secondary | ICD-10-CM | POA: Diagnosis not present

## 2022-10-25 DIAGNOSIS — N184 Chronic kidney disease, stage 4 (severe): Secondary | ICD-10-CM | POA: Diagnosis not present

## 2022-10-26 ENCOUNTER — Encounter (HOSPITAL_COMMUNITY)
Admission: RE | Admit: 2022-10-26 | Discharge: 2022-10-26 | Disposition: A | Discharge status: Home / Self Care | Payer: Medicare Other | Source: Ambulatory Visit | Attending: Nephrology | Admitting: Nephrology

## 2022-10-26 VITALS — BP 148/55 | HR 62 | Temp 97.4°F | Resp 17

## 2022-10-26 DIAGNOSIS — D6832 Hemorrhagic disorder due to extrinsic circulating anticoagulants: Secondary | ICD-10-CM | POA: Diagnosis present

## 2022-10-26 DIAGNOSIS — E1165 Type 2 diabetes mellitus with hyperglycemia: Secondary | ICD-10-CM | POA: Diagnosis present

## 2022-10-26 DIAGNOSIS — K922 Gastrointestinal hemorrhage, unspecified: Secondary | ICD-10-CM | POA: Diagnosis not present

## 2022-10-26 DIAGNOSIS — R58 Hemorrhage, not elsewhere classified: Secondary | ICD-10-CM | POA: Diagnosis not present

## 2022-10-26 DIAGNOSIS — K259 Gastric ulcer, unspecified as acute or chronic, without hemorrhage or perforation: Secondary | ICD-10-CM | POA: Diagnosis not present

## 2022-10-26 DIAGNOSIS — I878 Other specified disorders of veins: Secondary | ICD-10-CM | POA: Diagnosis present

## 2022-10-26 DIAGNOSIS — I482 Chronic atrial fibrillation, unspecified: Secondary | ICD-10-CM | POA: Diagnosis present

## 2022-10-26 DIAGNOSIS — I4821 Permanent atrial fibrillation: Secondary | ICD-10-CM | POA: Diagnosis not present

## 2022-10-26 DIAGNOSIS — I509 Heart failure, unspecified: Secondary | ICD-10-CM | POA: Diagnosis not present

## 2022-10-26 DIAGNOSIS — E669 Obesity, unspecified: Secondary | ICD-10-CM | POA: Diagnosis present

## 2022-10-26 DIAGNOSIS — I110 Hypertensive heart disease with heart failure: Secondary | ICD-10-CM | POA: Diagnosis not present

## 2022-10-26 DIAGNOSIS — E7800 Pure hypercholesterolemia, unspecified: Secondary | ICD-10-CM | POA: Diagnosis present

## 2022-10-26 DIAGNOSIS — I4891 Unspecified atrial fibrillation: Secondary | ICD-10-CM | POA: Diagnosis not present

## 2022-10-26 DIAGNOSIS — E1122 Type 2 diabetes mellitus with diabetic chronic kidney disease: Secondary | ICD-10-CM | POA: Diagnosis not present

## 2022-10-26 DIAGNOSIS — D62 Acute posthemorrhagic anemia: Secondary | ICD-10-CM | POA: Diagnosis not present

## 2022-10-26 DIAGNOSIS — K5730 Diverticulosis of large intestine without perforation or abscess without bleeding: Secondary | ICD-10-CM | POA: Diagnosis not present

## 2022-10-26 DIAGNOSIS — K921 Melena: Secondary | ICD-10-CM | POA: Diagnosis not present

## 2022-10-26 DIAGNOSIS — K5731 Diverticulosis of large intestine without perforation or abscess with bleeding: Secondary | ICD-10-CM | POA: Diagnosis not present

## 2022-10-26 DIAGNOSIS — K644 Residual hemorrhoidal skin tags: Secondary | ICD-10-CM | POA: Diagnosis not present

## 2022-10-26 DIAGNOSIS — I890 Lymphedema, not elsewhere classified: Secondary | ICD-10-CM | POA: Diagnosis not present

## 2022-10-26 DIAGNOSIS — K2980 Duodenitis without bleeding: Secondary | ICD-10-CM | POA: Diagnosis present

## 2022-10-26 DIAGNOSIS — R000 Tachycardia, unspecified: Secondary | ICD-10-CM | POA: Diagnosis not present

## 2022-10-26 DIAGNOSIS — A419 Sepsis, unspecified organism: Secondary | ICD-10-CM | POA: Diagnosis not present

## 2022-10-26 DIAGNOSIS — N179 Acute kidney failure, unspecified: Secondary | ICD-10-CM | POA: Diagnosis not present

## 2022-10-26 DIAGNOSIS — K219 Gastro-esophageal reflux disease without esophagitis: Secondary | ICD-10-CM | POA: Diagnosis present

## 2022-10-26 DIAGNOSIS — L03115 Cellulitis of right lower limb: Secondary | ICD-10-CM | POA: Diagnosis not present

## 2022-10-26 DIAGNOSIS — I959 Hypotension, unspecified: Secondary | ICD-10-CM | POA: Diagnosis not present

## 2022-10-26 DIAGNOSIS — N184 Chronic kidney disease, stage 4 (severe): Secondary | ICD-10-CM | POA: Diagnosis not present

## 2022-10-26 DIAGNOSIS — Z6829 Body mass index (BMI) 29.0-29.9, adult: Secondary | ICD-10-CM | POA: Diagnosis not present

## 2022-10-26 DIAGNOSIS — Z96651 Presence of right artificial knee joint: Secondary | ICD-10-CM | POA: Diagnosis present

## 2022-10-26 DIAGNOSIS — K2990 Gastroduodenitis, unspecified, without bleeding: Secondary | ICD-10-CM | POA: Diagnosis not present

## 2022-10-26 DIAGNOSIS — N281 Cyst of kidney, acquired: Secondary | ICD-10-CM | POA: Diagnosis not present

## 2022-10-26 DIAGNOSIS — I130 Hypertensive heart and chronic kidney disease with heart failure and stage 1 through stage 4 chronic kidney disease, or unspecified chronic kidney disease: Secondary | ICD-10-CM | POA: Diagnosis not present

## 2022-10-26 DIAGNOSIS — K2991 Gastroduodenitis, unspecified, with bleeding: Secondary | ICD-10-CM | POA: Diagnosis not present

## 2022-10-26 DIAGNOSIS — R079 Chest pain, unspecified: Secondary | ICD-10-CM | POA: Diagnosis not present

## 2022-10-26 DIAGNOSIS — D631 Anemia in chronic kidney disease: Secondary | ICD-10-CM | POA: Diagnosis not present

## 2022-10-26 DIAGNOSIS — K254 Chronic or unspecified gastric ulcer with hemorrhage: Secondary | ICD-10-CM | POA: Diagnosis not present

## 2022-10-26 DIAGNOSIS — Z85828 Personal history of other malignant neoplasm of skin: Secondary | ICD-10-CM | POA: Diagnosis not present

## 2022-10-26 DIAGNOSIS — E1149 Type 2 diabetes mellitus with other diabetic neurological complication: Secondary | ICD-10-CM | POA: Diagnosis not present

## 2022-10-26 DIAGNOSIS — I5032 Chronic diastolic (congestive) heart failure: Secondary | ICD-10-CM | POA: Diagnosis not present

## 2022-10-26 DIAGNOSIS — K2970 Gastritis, unspecified, without bleeding: Secondary | ICD-10-CM | POA: Diagnosis not present

## 2022-10-26 DIAGNOSIS — D649 Anemia, unspecified: Secondary | ICD-10-CM | POA: Diagnosis not present

## 2022-10-26 DIAGNOSIS — I10 Essential (primary) hypertension: Secondary | ICD-10-CM | POA: Diagnosis not present

## 2022-10-26 DIAGNOSIS — Z7901 Long term (current) use of anticoagulants: Secondary | ICD-10-CM | POA: Diagnosis not present

## 2022-10-26 DIAGNOSIS — T45515A Adverse effect of anticoagulants, initial encounter: Secondary | ICD-10-CM | POA: Diagnosis present

## 2022-10-26 LAB — IRON AND TIBC
Iron: 45 ug/dL (ref 28–170)
Saturation Ratios: 13 % (ref 10.4–31.8)
TIBC: 349 ug/dL (ref 250–450)
UIBC: 304 ug/dL

## 2022-10-26 LAB — CBC WITH DIFFERENTIAL/PLATELET
Abs Immature Granulocytes: 0.04 10*3/uL (ref 0.00–0.07)
Basophils Absolute: 0 10*3/uL (ref 0.0–0.1)
Basophils Relative: 0 %
Eosinophils Absolute: 0 10*3/uL (ref 0.0–0.5)
Eosinophils Relative: 1 %
HCT: 32.1 % — ABNORMAL LOW (ref 36.0–46.0)
Hemoglobin: 10.3 g/dL — ABNORMAL LOW (ref 12.0–15.0)
Immature Granulocytes: 1 %
Lymphocytes Relative: 18 %
Lymphs Abs: 1.2 10*3/uL (ref 0.7–4.0)
MCH: 30.4 pg (ref 26.0–34.0)
MCHC: 32.1 g/dL (ref 30.0–36.0)
MCV: 94.7 fL (ref 80.0–100.0)
Monocytes Absolute: 0.4 10*3/uL (ref 0.1–1.0)
Monocytes Relative: 6 %
Neutro Abs: 4.9 10*3/uL (ref 1.7–7.7)
Neutrophils Relative %: 74 %
Platelets: 271 10*3/uL (ref 150–400)
RBC: 3.39 MIL/uL — ABNORMAL LOW (ref 3.87–5.11)
RDW: 15.2 % (ref 11.5–15.5)
WBC: 6.6 10*3/uL (ref 4.0–10.5)
nRBC: 0 % (ref 0.0–0.2)

## 2022-10-26 LAB — RENAL FUNCTION PANEL
Albumin: 3.3 g/dL — ABNORMAL LOW (ref 3.5–5.0)
Anion gap: 16 — ABNORMAL HIGH (ref 5–15)
BUN: 74 mg/dL — ABNORMAL HIGH (ref 8–23)
CO2: 25 mmol/L (ref 22–32)
Calcium: 9.8 mg/dL (ref 8.9–10.3)
Chloride: 96 mmol/L — ABNORMAL LOW (ref 98–111)
Creatinine, Ser: 3.11 mg/dL — ABNORMAL HIGH (ref 0.44–1.00)
GFR, Estimated: 14 mL/min — ABNORMAL LOW (ref >60)
Glucose, Bld: 114 mg/dL — ABNORMAL HIGH (ref 70–99)
Phosphorus: 4.4 mg/dL (ref 2.5–4.6)
Potassium: 4.2 mmol/L (ref 3.5–5.1)
Sodium: 137 mmol/L (ref 135–145)

## 2022-10-26 LAB — FERRITIN: Ferritin: 971 ng/mL — ABNORMAL HIGH (ref 11–307)

## 2022-10-26 LAB — POCT HEMOGLOBIN-HEMACUE: Hemoglobin: 10.1 g/dL — ABNORMAL LOW (ref 12.0–15.0)

## 2022-10-26 MED ORDER — EPOETIN ALFA-EPBX 10000 UNIT/ML IJ SOLN
INTRAMUSCULAR | Status: AC
  Filled 2022-10-26: qty 1

## 2022-10-26 MED ORDER — EPOETIN ALFA-EPBX 10000 UNIT/ML IJ SOLN
10000.0000 [IU] | INTRAMUSCULAR | Status: DC
  Administered 2022-10-26: 10000 [IU] via SUBCUTANEOUS

## 2022-10-27 DIAGNOSIS — D631 Anemia in chronic kidney disease: Secondary | ICD-10-CM | POA: Diagnosis not present

## 2022-10-27 DIAGNOSIS — N184 Chronic kidney disease, stage 4 (severe): Secondary | ICD-10-CM | POA: Diagnosis not present

## 2022-10-27 DIAGNOSIS — D62 Acute posthemorrhagic anemia: Secondary | ICD-10-CM | POA: Diagnosis not present

## 2022-10-27 DIAGNOSIS — I130 Hypertensive heart and chronic kidney disease with heart failure and stage 1 through stage 4 chronic kidney disease, or unspecified chronic kidney disease: Secondary | ICD-10-CM | POA: Diagnosis not present

## 2022-10-27 DIAGNOSIS — E1122 Type 2 diabetes mellitus with diabetic chronic kidney disease: Secondary | ICD-10-CM | POA: Diagnosis not present

## 2022-10-27 DIAGNOSIS — I5032 Chronic diastolic (congestive) heart failure: Secondary | ICD-10-CM | POA: Diagnosis not present

## 2022-10-29 DIAGNOSIS — D631 Anemia in chronic kidney disease: Secondary | ICD-10-CM | POA: Diagnosis not present

## 2022-10-29 DIAGNOSIS — N184 Chronic kidney disease, stage 4 (severe): Secondary | ICD-10-CM | POA: Diagnosis not present

## 2022-10-29 DIAGNOSIS — I5032 Chronic diastolic (congestive) heart failure: Secondary | ICD-10-CM | POA: Diagnosis not present

## 2022-10-29 DIAGNOSIS — E1122 Type 2 diabetes mellitus with diabetic chronic kidney disease: Secondary | ICD-10-CM | POA: Diagnosis not present

## 2022-10-29 DIAGNOSIS — D62 Acute posthemorrhagic anemia: Secondary | ICD-10-CM | POA: Diagnosis not present

## 2022-10-29 DIAGNOSIS — I130 Hypertensive heart and chronic kidney disease with heart failure and stage 1 through stage 4 chronic kidney disease, or unspecified chronic kidney disease: Secondary | ICD-10-CM | POA: Diagnosis not present

## 2022-11-01 DIAGNOSIS — D62 Acute posthemorrhagic anemia: Secondary | ICD-10-CM | POA: Diagnosis not present

## 2022-11-01 DIAGNOSIS — I5032 Chronic diastolic (congestive) heart failure: Secondary | ICD-10-CM | POA: Diagnosis not present

## 2022-11-01 DIAGNOSIS — N184 Chronic kidney disease, stage 4 (severe): Secondary | ICD-10-CM | POA: Diagnosis not present

## 2022-11-01 DIAGNOSIS — I130 Hypertensive heart and chronic kidney disease with heart failure and stage 1 through stage 4 chronic kidney disease, or unspecified chronic kidney disease: Secondary | ICD-10-CM | POA: Diagnosis not present

## 2022-11-01 DIAGNOSIS — E1122 Type 2 diabetes mellitus with diabetic chronic kidney disease: Secondary | ICD-10-CM | POA: Diagnosis not present

## 2022-11-01 DIAGNOSIS — D631 Anemia in chronic kidney disease: Secondary | ICD-10-CM | POA: Diagnosis not present

## 2022-11-02 DIAGNOSIS — I130 Hypertensive heart and chronic kidney disease with heart failure and stage 1 through stage 4 chronic kidney disease, or unspecified chronic kidney disease: Secondary | ICD-10-CM | POA: Diagnosis not present

## 2022-11-02 DIAGNOSIS — D62 Acute posthemorrhagic anemia: Secondary | ICD-10-CM | POA: Diagnosis not present

## 2022-11-02 DIAGNOSIS — N184 Chronic kidney disease, stage 4 (severe): Secondary | ICD-10-CM | POA: Diagnosis not present

## 2022-11-02 DIAGNOSIS — D631 Anemia in chronic kidney disease: Secondary | ICD-10-CM | POA: Diagnosis not present

## 2022-11-02 DIAGNOSIS — E1122 Type 2 diabetes mellitus with diabetic chronic kidney disease: Secondary | ICD-10-CM | POA: Diagnosis not present

## 2022-11-02 DIAGNOSIS — I5032 Chronic diastolic (congestive) heart failure: Secondary | ICD-10-CM | POA: Diagnosis not present

## 2022-11-04 DIAGNOSIS — I5032 Chronic diastolic (congestive) heart failure: Secondary | ICD-10-CM | POA: Diagnosis not present

## 2022-11-04 DIAGNOSIS — E1122 Type 2 diabetes mellitus with diabetic chronic kidney disease: Secondary | ICD-10-CM | POA: Diagnosis not present

## 2022-11-04 DIAGNOSIS — D62 Acute posthemorrhagic anemia: Secondary | ICD-10-CM | POA: Diagnosis not present

## 2022-11-04 DIAGNOSIS — D631 Anemia in chronic kidney disease: Secondary | ICD-10-CM | POA: Diagnosis not present

## 2022-11-04 DIAGNOSIS — I130 Hypertensive heart and chronic kidney disease with heart failure and stage 1 through stage 4 chronic kidney disease, or unspecified chronic kidney disease: Secondary | ICD-10-CM | POA: Diagnosis not present

## 2022-11-04 DIAGNOSIS — N184 Chronic kidney disease, stage 4 (severe): Secondary | ICD-10-CM | POA: Diagnosis not present

## 2022-11-09 ENCOUNTER — Encounter (HOSPITAL_COMMUNITY)
Admission: RE | Admit: 2022-11-09 | Discharge: 2022-11-09 | Disposition: A | Discharge status: Home / Self Care | Payer: Medicare Other | Source: Ambulatory Visit | Attending: Nephrology | Admitting: Nephrology

## 2022-11-09 VITALS — BP 143/49 | HR 86 | Temp 97.1°F | Resp 17

## 2022-11-09 DIAGNOSIS — I130 Hypertensive heart and chronic kidney disease with heart failure and stage 1 through stage 4 chronic kidney disease, or unspecified chronic kidney disease: Secondary | ICD-10-CM | POA: Diagnosis not present

## 2022-11-09 DIAGNOSIS — D62 Acute posthemorrhagic anemia: Secondary | ICD-10-CM | POA: Diagnosis not present

## 2022-11-09 DIAGNOSIS — D631 Anemia in chronic kidney disease: Secondary | ICD-10-CM | POA: Diagnosis not present

## 2022-11-09 DIAGNOSIS — N184 Chronic kidney disease, stage 4 (severe): Secondary | ICD-10-CM

## 2022-11-09 DIAGNOSIS — E1122 Type 2 diabetes mellitus with diabetic chronic kidney disease: Secondary | ICD-10-CM | POA: Diagnosis not present

## 2022-11-09 DIAGNOSIS — I5032 Chronic diastolic (congestive) heart failure: Secondary | ICD-10-CM | POA: Diagnosis not present

## 2022-11-09 LAB — RENAL FUNCTION PANEL
Albumin: 3.2 g/dL — ABNORMAL LOW (ref 3.5–5.0)
Anion gap: 12 (ref 5–15)
BUN: 97 mg/dL — ABNORMAL HIGH (ref 8–23)
CO2: 23 mmol/L (ref 22–32)
Calcium: 9.3 mg/dL (ref 8.9–10.3)
Chloride: 99 mmol/L (ref 98–111)
Creatinine, Ser: 3.09 mg/dL — ABNORMAL HIGH (ref 0.44–1.00)
GFR, Estimated: 14 mL/min — ABNORMAL LOW (ref >60)
Glucose, Bld: 117 mg/dL — ABNORMAL HIGH (ref 70–99)
Phosphorus: 4.5 mg/dL (ref 2.5–4.6)
Potassium: 4 mmol/L (ref 3.5–5.1)
Sodium: 134 mmol/L — ABNORMAL LOW (ref 135–145)

## 2022-11-09 LAB — MAGNESIUM: Magnesium: 2 mg/dL (ref 1.7–2.4)

## 2022-11-09 MED ORDER — EPOETIN ALFA-EPBX 10000 UNIT/ML IJ SOLN
10000.0000 [IU] | INTRAMUSCULAR | Status: DC
  Administered 2022-11-09: 10000 [IU] via SUBCUTANEOUS

## 2022-11-09 MED ORDER — EPOETIN ALFA-EPBX 10000 UNIT/ML IJ SOLN
INTRAMUSCULAR | Status: AC
  Filled 2022-11-09: qty 1

## 2022-11-10 DIAGNOSIS — N184 Chronic kidney disease, stage 4 (severe): Secondary | ICD-10-CM | POA: Diagnosis not present

## 2022-11-10 DIAGNOSIS — D631 Anemia in chronic kidney disease: Secondary | ICD-10-CM | POA: Diagnosis not present

## 2022-11-10 DIAGNOSIS — E1122 Type 2 diabetes mellitus with diabetic chronic kidney disease: Secondary | ICD-10-CM | POA: Diagnosis not present

## 2022-11-10 DIAGNOSIS — I130 Hypertensive heart and chronic kidney disease with heart failure and stage 1 through stage 4 chronic kidney disease, or unspecified chronic kidney disease: Secondary | ICD-10-CM | POA: Diagnosis not present

## 2022-11-10 DIAGNOSIS — I5032 Chronic diastolic (congestive) heart failure: Secondary | ICD-10-CM | POA: Diagnosis not present

## 2022-11-10 DIAGNOSIS — D62 Acute posthemorrhagic anemia: Secondary | ICD-10-CM | POA: Diagnosis not present

## 2022-11-10 LAB — POCT HEMOGLOBIN-HEMACUE: Hemoglobin: 8.2 g/dL — ABNORMAL LOW (ref 12.0–15.0)

## 2022-11-11 DIAGNOSIS — I130 Hypertensive heart and chronic kidney disease with heart failure and stage 1 through stage 4 chronic kidney disease, or unspecified chronic kidney disease: Secondary | ICD-10-CM | POA: Diagnosis not present

## 2022-11-11 DIAGNOSIS — N184 Chronic kidney disease, stage 4 (severe): Secondary | ICD-10-CM | POA: Diagnosis not present

## 2022-11-11 DIAGNOSIS — D631 Anemia in chronic kidney disease: Secondary | ICD-10-CM | POA: Diagnosis not present

## 2022-11-11 DIAGNOSIS — I5032 Chronic diastolic (congestive) heart failure: Secondary | ICD-10-CM | POA: Diagnosis not present

## 2022-11-11 DIAGNOSIS — E1122 Type 2 diabetes mellitus with diabetic chronic kidney disease: Secondary | ICD-10-CM | POA: Diagnosis not present

## 2022-11-11 DIAGNOSIS — D62 Acute posthemorrhagic anemia: Secondary | ICD-10-CM | POA: Diagnosis not present

## 2022-11-12 ENCOUNTER — Inpatient Hospital Stay (HOSPITAL_COMMUNITY)
Admission: EM | Admit: 2022-11-12 | Discharge: 2022-11-16 | DRG: 378 | Disposition: A | Discharge status: Home Health | Payer: Medicare Other | Attending: Family Medicine | Admitting: Family Medicine

## 2022-11-12 ENCOUNTER — Encounter (HOSPITAL_COMMUNITY): Payer: Self-pay

## 2022-11-12 ENCOUNTER — Emergency Department (HOSPITAL_COMMUNITY): Payer: Medicare Other

## 2022-11-12 ENCOUNTER — Other Ambulatory Visit: Payer: Self-pay

## 2022-11-12 DIAGNOSIS — I4819 Other persistent atrial fibrillation: Secondary | ICD-10-CM | POA: Diagnosis present

## 2022-11-12 DIAGNOSIS — R000 Tachycardia, unspecified: Secondary | ICD-10-CM | POA: Diagnosis not present

## 2022-11-12 DIAGNOSIS — T45515A Adverse effect of anticoagulants, initial encounter: Secondary | ICD-10-CM | POA: Diagnosis present

## 2022-11-12 DIAGNOSIS — R58 Hemorrhage, not elsewhere classified: Secondary | ICD-10-CM | POA: Diagnosis not present

## 2022-11-12 DIAGNOSIS — N179 Acute kidney failure, unspecified: Secondary | ICD-10-CM | POA: Diagnosis not present

## 2022-11-12 DIAGNOSIS — E669 Obesity, unspecified: Secondary | ICD-10-CM | POA: Diagnosis present

## 2022-11-12 DIAGNOSIS — K921 Melena: Secondary | ICD-10-CM | POA: Diagnosis not present

## 2022-11-12 DIAGNOSIS — Z85828 Personal history of other malignant neoplasm of skin: Secondary | ICD-10-CM | POA: Diagnosis not present

## 2022-11-12 DIAGNOSIS — K5730 Diverticulosis of large intestine without perforation or abscess without bleeding: Secondary | ICD-10-CM | POA: Diagnosis present

## 2022-11-12 DIAGNOSIS — I482 Chronic atrial fibrillation, unspecified: Secondary | ICD-10-CM | POA: Diagnosis present

## 2022-11-12 DIAGNOSIS — L03115 Cellulitis of right lower limb: Secondary | ICD-10-CM

## 2022-11-12 DIAGNOSIS — I878 Other specified disorders of veins: Secondary | ICD-10-CM | POA: Diagnosis present

## 2022-11-12 DIAGNOSIS — I890 Lymphedema, not elsewhere classified: Secondary | ICD-10-CM | POA: Diagnosis present

## 2022-11-12 DIAGNOSIS — Z8249 Family history of ischemic heart disease and other diseases of the circulatory system: Secondary | ICD-10-CM

## 2022-11-12 DIAGNOSIS — I130 Hypertensive heart and chronic kidney disease with heart failure and stage 1 through stage 4 chronic kidney disease, or unspecified chronic kidney disease: Secondary | ICD-10-CM | POA: Diagnosis present

## 2022-11-12 DIAGNOSIS — D6832 Hemorrhagic disorder due to extrinsic circulating anticoagulants: Secondary | ICD-10-CM | POA: Diagnosis present

## 2022-11-12 DIAGNOSIS — K219 Gastro-esophageal reflux disease without esophagitis: Secondary | ICD-10-CM | POA: Diagnosis present

## 2022-11-12 DIAGNOSIS — I4891 Unspecified atrial fibrillation: Secondary | ICD-10-CM | POA: Diagnosis present

## 2022-11-12 DIAGNOSIS — N184 Chronic kidney disease, stage 4 (severe): Secondary | ICD-10-CM | POA: Diagnosis present

## 2022-11-12 DIAGNOSIS — K259 Gastric ulcer, unspecified as acute or chronic, without hemorrhage or perforation: Secondary | ICD-10-CM | POA: Diagnosis not present

## 2022-11-12 DIAGNOSIS — N185 Chronic kidney disease, stage 5: Secondary | ICD-10-CM | POA: Diagnosis present

## 2022-11-12 DIAGNOSIS — A419 Sepsis, unspecified organism: Secondary | ICD-10-CM

## 2022-11-12 DIAGNOSIS — K2970 Gastritis, unspecified, without bleeding: Secondary | ICD-10-CM | POA: Diagnosis present

## 2022-11-12 DIAGNOSIS — D62 Acute posthemorrhagic anemia: Secondary | ICD-10-CM | POA: Diagnosis not present

## 2022-11-12 DIAGNOSIS — K2991 Gastroduodenitis, unspecified, with bleeding: Secondary | ICD-10-CM | POA: Diagnosis not present

## 2022-11-12 DIAGNOSIS — K2980 Duodenitis without bleeding: Secondary | ICD-10-CM | POA: Diagnosis present

## 2022-11-12 DIAGNOSIS — E1165 Type 2 diabetes mellitus with hyperglycemia: Secondary | ICD-10-CM | POA: Diagnosis present

## 2022-11-12 DIAGNOSIS — K2990 Gastroduodenitis, unspecified, without bleeding: Secondary | ICD-10-CM | POA: Diagnosis not present

## 2022-11-12 DIAGNOSIS — Z79899 Other long term (current) drug therapy: Secondary | ICD-10-CM

## 2022-11-12 DIAGNOSIS — Z9049 Acquired absence of other specified parts of digestive tract: Secondary | ICD-10-CM

## 2022-11-12 DIAGNOSIS — D649 Anemia, unspecified: Secondary | ICD-10-CM

## 2022-11-12 DIAGNOSIS — N281 Cyst of kidney, acquired: Secondary | ICD-10-CM | POA: Diagnosis not present

## 2022-11-12 DIAGNOSIS — E1149 Type 2 diabetes mellitus with other diabetic neurological complication: Secondary | ICD-10-CM | POA: Diagnosis not present

## 2022-11-12 DIAGNOSIS — I4821 Permanent atrial fibrillation: Secondary | ICD-10-CM

## 2022-11-12 DIAGNOSIS — Z8042 Family history of malignant neoplasm of prostate: Secondary | ICD-10-CM

## 2022-11-12 DIAGNOSIS — E119 Type 2 diabetes mellitus without complications: Secondary | ICD-10-CM

## 2022-11-12 DIAGNOSIS — Z803 Family history of malignant neoplasm of breast: Secondary | ICD-10-CM

## 2022-11-12 DIAGNOSIS — R079 Chest pain, unspecified: Secondary | ICD-10-CM | POA: Diagnosis not present

## 2022-11-12 DIAGNOSIS — Z888 Allergy status to other drugs, medicaments and biological substances status: Secondary | ICD-10-CM

## 2022-11-12 DIAGNOSIS — I959 Hypotension, unspecified: Secondary | ICD-10-CM | POA: Diagnosis not present

## 2022-11-12 DIAGNOSIS — K254 Chronic or unspecified gastric ulcer with hemorrhage: Principal | ICD-10-CM | POA: Diagnosis present

## 2022-11-12 DIAGNOSIS — K922 Gastrointestinal hemorrhage, unspecified: Secondary | ICD-10-CM | POA: Diagnosis present

## 2022-11-12 DIAGNOSIS — I5032 Chronic diastolic (congestive) heart failure: Secondary | ICD-10-CM | POA: Diagnosis present

## 2022-11-12 DIAGNOSIS — Z7901 Long term (current) use of anticoagulants: Secondary | ICD-10-CM

## 2022-11-12 DIAGNOSIS — E7800 Pure hypercholesterolemia, unspecified: Secondary | ICD-10-CM | POA: Diagnosis present

## 2022-11-12 DIAGNOSIS — I509 Heart failure, unspecified: Secondary | ICD-10-CM | POA: Diagnosis not present

## 2022-11-12 DIAGNOSIS — E1122 Type 2 diabetes mellitus with diabetic chronic kidney disease: Secondary | ICD-10-CM | POA: Diagnosis present

## 2022-11-12 DIAGNOSIS — K5731 Diverticulosis of large intestine without perforation or abscess with bleeding: Secondary | ICD-10-CM | POA: Diagnosis not present

## 2022-11-12 DIAGNOSIS — I10 Essential (primary) hypertension: Secondary | ICD-10-CM | POA: Diagnosis not present

## 2022-11-12 DIAGNOSIS — Z6829 Body mass index (BMI) 29.0-29.9, adult: Secondary | ICD-10-CM

## 2022-11-12 DIAGNOSIS — I5030 Unspecified diastolic (congestive) heart failure: Secondary | ICD-10-CM | POA: Diagnosis present

## 2022-11-12 DIAGNOSIS — D631 Anemia in chronic kidney disease: Secondary | ICD-10-CM | POA: Diagnosis not present

## 2022-11-12 DIAGNOSIS — I110 Hypertensive heart disease with heart failure: Secondary | ICD-10-CM | POA: Diagnosis not present

## 2022-11-12 DIAGNOSIS — K644 Residual hemorrhoidal skin tags: Secondary | ICD-10-CM | POA: Diagnosis present

## 2022-11-12 DIAGNOSIS — Z880 Allergy status to penicillin: Secondary | ICD-10-CM

## 2022-11-12 DIAGNOSIS — Z96651 Presence of right artificial knee joint: Secondary | ICD-10-CM | POA: Diagnosis present

## 2022-11-12 DIAGNOSIS — Z881 Allergy status to other antibiotic agents status: Secondary | ICD-10-CM

## 2022-11-12 LAB — TYPE AND SCREEN
ABO/RH(D): O POS
Antibody Screen: NEGATIVE

## 2022-11-12 LAB — COMPREHENSIVE METABOLIC PANEL
ALT: 14 U/L (ref 0–44)
AST: 38 U/L (ref 15–41)
Albumin: 2.7 g/dL — ABNORMAL LOW (ref 3.5–5.0)
Alkaline Phosphatase: 26 U/L — ABNORMAL LOW (ref 38–126)
Anion gap: 14 (ref 5–15)
BUN: 108 mg/dL — ABNORMAL HIGH (ref 8–23)
CO2: 20 mmol/L — ABNORMAL LOW (ref 22–32)
Calcium: 8.8 mg/dL — ABNORMAL LOW (ref 8.9–10.3)
Chloride: 101 mmol/L (ref 98–111)
Creatinine, Ser: 3.21 mg/dL — ABNORMAL HIGH (ref 0.44–1.00)
GFR, Estimated: 13 mL/min — ABNORMAL LOW (ref >60)
Glucose, Bld: 106 mg/dL — ABNORMAL HIGH (ref 70–99)
Potassium: 3.8 mmol/L (ref 3.5–5.1)
Sodium: 135 mmol/L (ref 135–145)
Total Bilirubin: 1.3 mg/dL — ABNORMAL HIGH (ref 0.3–1.2)
Total Protein: 5.8 g/dL — ABNORMAL LOW (ref 6.5–8.1)

## 2022-11-12 LAB — CBC WITH DIFFERENTIAL/PLATELET
Abs Immature Granulocytes: 0.09 10*3/uL — ABNORMAL HIGH (ref 0.00–0.07)
Basophils Absolute: 0 10*3/uL (ref 0.0–0.1)
Basophils Relative: 0 %
Eosinophils Absolute: 0 10*3/uL (ref 0.0–0.5)
Eosinophils Relative: 0 %
HCT: 21.3 % — ABNORMAL LOW (ref 36.0–46.0)
Hemoglobin: 6.4 g/dL — CL (ref 12.0–15.0)
Immature Granulocytes: 1 %
Lymphocytes Relative: 8 %
Lymphs Abs: 0.8 10*3/uL (ref 0.7–4.0)
MCH: 30.2 pg (ref 26.0–34.0)
MCHC: 30 g/dL (ref 30.0–36.0)
MCV: 100.5 fL — ABNORMAL HIGH (ref 80.0–100.0)
Monocytes Absolute: 0.3 10*3/uL (ref 0.1–1.0)
Monocytes Relative: 2 %
Neutro Abs: 9.2 10*3/uL — ABNORMAL HIGH (ref 1.7–7.7)
Neutrophils Relative %: 89 %
Platelets: 166 10*3/uL (ref 150–400)
RBC: 2.12 MIL/uL — ABNORMAL LOW (ref 3.87–5.11)
RDW: 15.9 % — ABNORMAL HIGH (ref 11.5–15.5)
WBC: 10.3 10*3/uL (ref 4.0–10.5)
nRBC: 0 % (ref 0.0–0.2)

## 2022-11-12 LAB — APTT: aPTT: 33 seconds (ref 24–36)

## 2022-11-12 LAB — BPAM RBC
Unit Type and Rh: 5100
Unit Type and Rh: 5100

## 2022-11-12 LAB — LACTIC ACID, PLASMA: Lactic Acid, Venous: 1.1 mmol/L (ref 0.5–1.9)

## 2022-11-12 LAB — PROTIME-INR
INR: 1.4 — ABNORMAL HIGH (ref 0.8–1.2)
Prothrombin Time: 17.4 seconds — ABNORMAL HIGH (ref 11.4–15.2)

## 2022-11-12 LAB — PREPARE RBC (CROSSMATCH)

## 2022-11-12 MED ORDER — SODIUM CHLORIDE 0.9 % IV SOLN: 1.0000 g | Freq: Once | INTRAVENOUS | Status: DC

## 2022-11-12 MED ORDER — LACTATED RINGERS IV SOLN: INTRAVENOUS | Status: DC

## 2022-11-12 MED ORDER — LACTATED RINGERS IV BOLUS (SEPSIS)
500.0000 mL | Freq: Once | INTRAVENOUS | Status: AC
  Administered 2022-11-12: 500 mL via INTRAVENOUS

## 2022-11-12 MED ORDER — ACETAMINOPHEN 650 MG RE SUPP: 650.0000 mg | Freq: Four times a day (QID) | RECTAL | Status: DC | PRN

## 2022-11-12 MED ORDER — FUROSEMIDE 10 MG/ML IJ SOLN
80.0000 mg | INTRAMUSCULAR | Status: AC | PRN
  Administered 2022-11-12: 80 mg via INTRAVENOUS
  Filled 2022-11-12: qty 8

## 2022-11-12 MED ORDER — INSULIN ASPART 100 UNIT/ML IJ SOLN
0.0000 [IU] | Freq: Three times a day (TID) | INTRAMUSCULAR | Status: DC
  Administered 2022-11-13 – 2022-11-16 (×3): 1 [IU] via SUBCUTANEOUS

## 2022-11-12 MED ORDER — ONDANSETRON HCL 4 MG/2ML IJ SOLN
4.0000 mg | Freq: Four times a day (QID) | INTRAMUSCULAR | Status: DC | PRN
  Administered 2022-11-16: 4 mg via INTRAVENOUS
  Filled 2022-11-12 (×2): qty 2

## 2022-11-12 MED ORDER — CEPHALEXIN 250 MG PO CAPS
500.0000 mg | ORAL_CAPSULE | Freq: Three times a day (TID) | ORAL | Status: DC
  Administered 2022-11-12 – 2022-11-13 (×4): 500 mg via ORAL
  Filled 2022-11-12 (×5): qty 2

## 2022-11-12 MED ORDER — SODIUM CHLORIDE 0.9 % IV SOLN
2.0000 g | Freq: Once | INTRAVENOUS | Status: AC
  Administered 2022-11-12: 2 g via INTRAVENOUS
  Filled 2022-11-12: qty 20

## 2022-11-12 MED ORDER — PANTOPRAZOLE SODIUM 40 MG IV SOLR
40.0000 mg | Freq: Once | INTRAVENOUS | Status: AC
  Administered 2022-11-12: 40 mg via INTRAVENOUS
  Filled 2022-11-12: qty 10

## 2022-11-12 MED ORDER — PANTOPRAZOLE INFUSION (NEW) - SIMPLE MED
8.0000 mg/h | INTRAVENOUS | Status: DC
  Administered 2022-11-12 – 2022-11-14 (×5): 8 mg/h via INTRAVENOUS
  Filled 2022-11-12 (×5): qty 100

## 2022-11-12 MED ORDER — SODIUM CHLORIDE 0.9% IV SOLUTION: Freq: Once | INTRAVENOUS | Status: AC

## 2022-11-12 MED ORDER — ACETAMINOPHEN 500 MG PO TABS
1000.0000 mg | ORAL_TABLET | Freq: Once | ORAL | Status: AC
  Administered 2022-11-12: 1000 mg via ORAL
  Filled 2022-11-12: qty 2

## 2022-11-12 MED ORDER — METOPROLOL SUCCINATE ER 50 MG PO TB24
50.0000 mg | ORAL_TABLET | Freq: Every evening | ORAL | Status: DC
  Administered 2022-11-13 – 2022-11-15 (×3): 50 mg via ORAL
  Filled 2022-11-12 (×3): qty 1

## 2022-11-12 MED ORDER — ACETAMINOPHEN 325 MG PO TABS
650.0000 mg | ORAL_TABLET | Freq: Four times a day (QID) | ORAL | Status: DC | PRN
  Administered 2022-11-12 – 2022-11-15 (×2): 650 mg via ORAL
  Filled 2022-11-12 (×3): qty 2

## 2022-11-12 MED ORDER — PANTOPRAZOLE SODIUM 40 MG IV SOLR: 40.0000 mg | Freq: Two times a day (BID) | INTRAVENOUS | Status: DC

## 2022-11-12 MED ORDER — INSULIN ASPART 100 UNIT/ML IJ SOLN: 0.0000 [IU] | Freq: Every day | INTRAMUSCULAR | Status: DC

## 2022-11-12 MED ORDER — PANTOPRAZOLE SODIUM 40 MG IV SOLR
40.0000 mg | Freq: Once | INTRAVENOUS | Status: AC
  Administered 2022-11-12: 40 mg via INTRAVENOUS
  Filled 2022-11-12 (×2): qty 10

## 2022-11-12 MED ORDER — ONDANSETRON HCL 4 MG PO TABS: 4.0000 mg | ORAL_TABLET | Freq: Four times a day (QID) | ORAL | Status: DC | PRN

## 2022-11-12 NOTE — H&P (Signed)
History and Physical    Tabitha Black RUE:[id number] DOB: 07-30-1932 DOA: 11/12/2022  DOS: the patient was seen and examined on 11/12/2022  PCP: Irven Coe, MD   Patient coming from: Home  I have personally briefly reviewed patient's old medical records in [facility]  CC: GI bleeding HPI: 89 year old female history of morbid obesity, bilateral lower extremity lymphedema, chronic diastolic heart failure, CKD stage IV baseline creatinine 2.9-3.1, chronic A-fib on Eliquis, hypertension, presents the ER today with GI bleeding for the last 5 days.  Patient had a GI bleed in February 2024.  She underwent EGD which demonstrated gastric ulcerations.  Pathology was negative for H. pylori.  She was restarted on her outpatient Eliquis.  Patient states that she has been on her Eliquis this whole week.  This is despite having GI bleeding that started on Monday.  Patient's been feeling increasingly weak.  Arrival temp 99.3 which increased to 101.9 heart rate 93 blood pressure 130/53 satting 100% on room air.  Sodium 135, potassium 3.8, chloride 101, bicarb of 20, BUN of 108, creatinine 3.2, glucose 106  White count 10.3, hemoglobin 6.4, platelets of 166  CT abdomen pelvis without contrast demonstrated no acute bleeding.  She is a chronic right lateral peritoneal wall defect unchanged from 03/2015  Chest x-ray demonstrated cardiomegaly without failure.  Patient had melanotic stools.  [facility] hospitalist contacted for admission.     ED Course: HgB 6.4, BUN 108, Scr 3.2  Review of Systems:  Review of Systems  Constitutional:  Positive for malaise/fatigue.  HENT: Negative.    Eyes: Negative.   Respiratory: Negative.    Cardiovascular:  Positive for leg swelling.  Gastrointestinal:        Dark, bloody stools x 5 days. Last dose of Eliquis was this morning 11-12-2022.  Genitourinary: Negative.   Skin:        Chronic leg edema  Neurological: Negative.   Endo/Heme/Allergies: Negative.    Psychiatric/Behavioral: Negative.    All other systems reviewed and are negative.   Past Medical History:  Diagnosis Date   ABLA (acute blood loss anemia) 08/23/2022   CKD (chronic kidney disease)    Diabetes mellitus without complication (HCC)    type II   Gastrointestinal hemorrhage with melena 08/23/2022   GERD (gastroesophageal reflux disease)    High cholesterol    Hypertension    Osteoarthritis    Skin cancer, basal cell     Past Surgical History:  Procedure Laterality Date   ABDOMINAL HYSTERECTOMY N/A    APPENDECTOMY N/A 1953   BACK SURGERY     BIOPSY  08/25/2022   Procedure: BIOPSY;  Surgeon: Charlott Rakes, MD;  Location: [facility] ENDOSCOPY;  Service: Gastroenterology;;   CHOLECYSTECTOMY     COLONOSCOPY N/A 02/22/2008   ESOPHAGOGASTRODUODENOSCOPY (EGD) WITH PROPOFOL N/A 08/25/2022   Procedure: ESOPHAGOGASTRODUODENOSCOPY (EGD) WITH PROPOFOL;  Surgeon: Charlott Rakes, MD;  Location: [facility] ENDOSCOPY;  Service: Gastroenterology;  Laterality: N/A;   TOTAL KNEE ARTHROPLASTY Right    UMBILICAL HERNIA REPAIR N/A 04/02/2015   Procedure: LAPAROSCOPIC UMBILICAL HERNIA REPAIR WITH VENTRALIGHT MESH;  Surgeon: Axel Filler, MD;  Location: [facility] OR;  Service: General;  Laterality: N/A;     reports that she has never smoked. She has never used smokeless tobacco. She reports current alcohol use. She reports that she does not use drugs.  Allergies  Allergen Reactions   Other Swelling    Topical mycin, unsure of which one, caused opthalmic swelling  Erythromycin    Penicillins Itching,  Other (See Comments), Rash, Shortness Of Breath and Swelling   Lovastatin Other (See Comments)    Weakness and myalgias, maybe to statins?   Azithromycin     Patient doesn't remember having a reaction to this.   Clindamycin     Patient doesn't remember having a reaction to this.    Hydralazine     Other reaction(s): felt foggy   Lisinopril Other (See Comments)    Hyperkalemia    Neomycin Sulfate  [Neomycin] Other (See Comments)    Redness and burning in face   Niacin Hives    Facial swelling and redness   Rofecoxib Other (See Comments)    Unknown reaction   Tetracycline    Valsartan Other (See Comments)    Hyperkalemia    Vytorin [Ezetimibe-Simvastatin] Other (See Comments)    myalgias   Zetia [Ezetimibe] Other (See Comments)    Leg pain    Family History  Problem Relation Age of Onset   Heart failure Mother    Hypertension Mother    Heart attack Father    Prostate cancer Father    Breast cancer Sister     Prior to Admission medications   Medication Sig Start Date End Date Taking? Authorizing Provider  acetaminophen (TYLENOL) 325 MG tablet Take 2 tablets (650 mg total) by mouth every 6 (six) hours as needed for mild pain (or Fever >/= 101). 08/27/22   Ghimire, Werner Lean, MD  apixaban (ELIQUIS) 2.5 MG TABS tablet Take 1 tablet (2.5 mg total) by mouth 2 (two) times daily. 06/08/22   Wendall Stade, MD  benzonatate (TESSALON) 200 MG capsule Take 1 capsule (200 mg) by mouth 3 times daily as needed for cough. 10/06/22   Danford, Earl Lites, MD  Cholecalciferol (VITAMIN D3) 125 MCG (5000 UT) TABS Take 5,000 Units by mouth daily.    [provider]  cyclobenzaprine (FLEXERIL) 10 MG tablet Take 5 mg by mouth daily as needed for muscle spasms.    [provider]  doxazosin (CARDURA) 4 MG tablet Take 4 mg by mouth daily.    [provider]  gemfibrozil (LOPID) 600 MG tablet Take 600 mg by mouth daily. 05/08/20   [provider]  guaiFENesin 200 MG tablet Take 1 tablet (200 mg total) by mouth every 4 (four) hours as needed for cough or to loosen phlegm. 10/06/22   Danford, Earl Lites, MD  Iron-FA-B Cmp-C-Biot-Probiotic (FUSION PLUS) CAPS Take 1 capsule by mouth daily. 04/19/20   [provider]  isosorbide mononitrate (IMDUR) 30 MG 24 hr tablet Take 1/2 tablet (15 mg) by mouth at bedtime. 10/06/22   Danford, Earl Lites, MD  metoprolol  succinate (TOPROL-XL) 100 MG 24 hr tablet Take 100 mg by mouth every evening. Take with or immediately following a meal.    [provider]  Multiple Vitamins-Minerals (HAIR SKIN AND NAILS FORMULA) TABS Take 1 tablet by mouth daily.    [provider]  potassium chloride SA (KLOR-CON M) 20 MEQ tablet Take 1 tablet (20 mEq) by mouth daily. 10/06/22   Danford, Earl Lites, MD  torsemide (DEMADEX) 20 MG tablet Take 20 mg by mouth 2 (two) times daily. 09/11/22   [provider]    Physical Exam: Vitals:   11/12/22 2030 11/12/22 2045 11/12/22 2100 11/12/22 2115  BP: (!) 131/50 128/62 135/86 (!) 121/47  Pulse: 92 88 94 63  Resp: (!) 24 (!) 26 (!) 23 20  Temp:    97.9 F (36.6 C)  TempSrc:  Oral  SpO2: 100% 100% 100% 99%    Physical Exam Vitals and nursing note reviewed.  Constitutional:      General: She is not in acute distress.    Appearance: She is obese. She is not toxic-appearing.     Comments: Chronically ill appearing  HENT:     Head: Normocephalic and atraumatic.     Nose: Nose normal.  Cardiovascular:     Rate and Rhythm: Normal rate. Rhythm irregular.  Pulmonary:     Effort: Pulmonary effort is normal.     Breath sounds: Normal breath sounds.  Abdominal:     General: Abdomen is protuberant. Bowel sounds are normal.     Palpations: Abdomen is soft.     Tenderness: There is no abdominal tenderness.  Musculoskeletal:     Right lower leg: Edema present.     Left lower leg: Edema present.     Comments: See pictures  Skin:    Capillary Refill: Capillary refill takes less than 2 seconds.  Neurological:     Mental Status: She is oriented to person, place, and time.      Labs on Admission: I have personally reviewed following labs and imaging studies  CBC: Recent Labs  Lab 11/09/22 1244 11/12/22 1631  WBC  --  10.3  NEUTROABS  --  9.2*  HGB 8.2* 6.4*  HCT  --  21.3*  MCV  --  100.5*  PLT  --  166   Basic Metabolic Panel: Recent  Labs  Lab 11/09/22 1300 11/12/22 1631  NA 134* 135  K 4.0 3.8  CL 99 101  CO2 23 20*  GLUCOSE 117* 106*  BUN 97* 108*  CREATININE 3.09* 3.21*  CALCIUM 9.3 8.8*  MG 2.0  --   PHOS 4.5  --    GFR: CrCl cannot be calculated (Unknown ideal weight.). Liver Function Tests: Recent Labs  Lab 11/09/22 1300 11/12/22 1631  AST  --  38  ALT  --  14  ALKPHOS  --  26*  BILITOT  --  1.3*  PROT  --  5.8*  ALBUMIN 3.2* 2.7*   Coagulation Profile: Recent Labs  Lab 11/12/22 1631  INR 1.4*   BNP (last 3 results) Recent Labs    10/03/22 2242  BNP 472.6*   Radiological Exams on Admission: I have personally reviewed images CT ABDOMEN PELVIS WO CONTRAST  Result Date: 11/12/2022 CLINICAL DATA:  Abdominal pain and dark stools for 1 week EXAM: CT ABDOMEN AND PELVIS WITHOUT CONTRAST TECHNIQUE: Multidetector CT imaging of the abdomen and pelvis was performed following the standard protocol without IV contrast. RADIATION DOSE REDUCTION: This exam was performed according to the departmental dose-optimization program which includes automated exposure control, adjustment of the mA and/or kV according to patient size and/or use of iterative reconstruction technique. COMPARISON:  03/31/2015 FINDINGS: Lower chest: Small right-sided pleural effusion is noted. Mild bibasilar atelectasis is seen. Hepatobiliary: No focal liver abnormality is seen. Status post cholecystectomy. No biliary dilatation. Pancreas: Pancreas is somewhat atrophic.  No focal mass is seen. Spleen: Normal in size without focal abnormality. Adrenals/Urinary Tract: Stable left adrenal adenoma is noted. The right kidney is somewhat ptotic in nature. Cystic changes are seen. No further follow-up is recommended. No obstructive changes are seen. The bladder is well distended without focal abnormality. Left lateral bladder diverticulum is noted Stomach/Bowel: Scattered diverticular change of the colon is noted without evidence of diverticulitis.  Stomach is decompressed. Small bowel appears within normal limits. Vascular/Lymphatic: Aortic atherosclerosis.  No enlarged abdominal or pelvic lymph nodes. Reproductive: Status post hysterectomy. No adnexal masses. Other: Laxity of the anterior abdominal wall is noted although no true herniation is seen. This lies in an area of prior umbilical hernia repair. Additionally there is a peritoneal wall defect laterally on the right containing omental fat. No herniated bowel is noted within. This appears overall stable when compared with the prior exam. Musculoskeletal: Postsurgical and degenerative changes of lumbar spine are noted. IMPRESSION: No findings to correspond with the given clinical history of dark tarry stools. Stable right lateral peritoneal wall defect unchanged from prior exam. The previously seen umbilical hernia has been. Although significant laxity of the anterior abdominal wall is noted without definitive herniation. Diverticulosis without diverticulitis. Electronically Signed   By: Alcide Clever M.D.   On: 11/12/2022 19:37   DG Chest Port 1 View  Result Date: 11/12/2022 CLINICAL DATA:  Questionable sepsis. Evaluate for abnormality. Dark stools. Abnormal pain. EXAM: PORTABLE CHEST 1 VIEW COMPARISON:  One-view chest x-ray 3/17/4 FINDINGS: Cardiomegaly is stable. Edema or effusion is present. No focal airspace disease is present. Visualized soft tissues bony thorax are unremarkable. IMPRESSION: 1. Cardiomegaly without failure. 2. Edema or effusion. Electronically Signed   By: Marin Roberts M.D.   On: 11/12/2022 16:25    EKG: My personal interpretation of EKG shows: afib    Assessment/Plan Principal Problem:   GI bleeding Active Problems:   Cellulitis of right leg   Chronic diastolic CHF (congestive heart failure) (HCC)   Chronic kidney disease, stage 4 (severe) (HCC) -Baseline Scr 2.9-3.1   Gastroesophageal reflux disease   Morbid obesity (HCC)   HTN (hypertension)   A-fib  (HCC)   DM2 (diabetes mellitus, type 2) (HCC)   Lymphedema of both lower extremities - R > L    Assessment and Plan: * GI bleeding Admit to progressive bed. Transfuse 2 units PRBC. Give IV lasix 80 mg in between PRBC units. GI consulted by EDP. Keep NPO. On IV protonix gtts. Hx of gastric ulcers on EGD in 08-2022.  Cellulitis of right leg Start po keflex 500 mg q8h  Lymphedema of both lower extremities - R > L Chronic.  11/12/2022 04/19/2022                DM2 (diabetes mellitus, type 2) (HCC) Add SSI q4h.  A-fib (HCC) Continue Toprol-xL for rate control. Holding Eliquis due to GI bleeding.  HTN (hypertension) Hold BP meds until anemia corrected.  Morbid obesity (HCC) Chronic.  Chronic kidney disease, stage 4 (severe) (HCC) -Baseline Scr 2.9-3.1 Chronic. Baseline Scr 2.9-3.1  Chronic diastolic CHF (congestive heart failure) (HCC) Will need diuresis after PRBC transfusion.   DVT prophylaxis: SCDs Code Status: Full Code Family Communication: no family at bedside  Disposition Plan: return home  Consults called: EDP has consulted GI([place]) Admission status: Inpatient,  progressive   Carollee Herter, DO [facility] 11/12/2022, 9:26 PM

## 2022-11-12 NOTE — ED Provider Notes (Signed)
[facility] Provider Note   CSN: [id number] Arrival date & time: 11/12/22  1514     History  Chief Complaint  Patient presents with   GI Problem    GI bleed    RIAN KOON is a 89 y.o. female.  Patient is a 89 year old female with a history of atrial fibrillation on Eliquis, hypertension, diabetes, chronic kidney disease, GI bleed with recent endoscopy 6 weeks ago showing gastric antrum ulcers that patient reports were getting better but she reports that this week she has noticed dark stools usually about 1 a day but they have become more runny and feeling generally rundown.  She also reported some right-sided abdominal pain but has not had any nausea or vomiting.  She is not aware of any fevers or urinary issues.  She has been getting her light legs wrapped regularly for edema and stasis.  She does note significant pain in her right lower leg.  She last took her Eliquis yesterday.  The history is provided by the patient and medical records.  GI Problem       Home Medications Prior to Admission medications   Medication Sig Start Date End Date Taking? Authorizing Provider  acetaminophen (TYLENOL) 325 MG tablet Take 2 tablets (650 mg total) by mouth every 6 (six) hours as needed for mild pain (or Fever >/= 101). 08/27/22   Ghimire, Werner Lean, MD  apixaban (ELIQUIS) 2.5 MG TABS tablet Take 1 tablet (2.5 mg total) by mouth 2 (two) times daily. 06/08/22   Wendall Stade, MD  benzonatate (TESSALON) 200 MG capsule Take 1 capsule (200 mg) by mouth 3 times daily as needed for cough. 10/06/22   Danford, Earl Lites, MD  Cholecalciferol (VITAMIN D3) 125 MCG (5000 UT) TABS Take 5,000 Units by mouth daily.    [provider]  cyclobenzaprine (FLEXERIL) 10 MG tablet Take 5 mg by mouth daily as needed for muscle spasms.    [provider]  doxazosin (CARDURA) 4 MG tablet Take 4 mg by mouth daily.    [provider]   gemfibrozil (LOPID) 600 MG tablet Take 600 mg by mouth daily. 05/08/20   [provider]  guaiFENesin 200 MG tablet Take 1 tablet (200 mg total) by mouth every 4 (four) hours as needed for cough or to loosen phlegm. 10/06/22   Danford, Earl Lites, MD  Iron-FA-B Cmp-C-Biot-Probiotic (FUSION PLUS) CAPS Take 1 capsule by mouth daily. 04/19/20   [provider]  isosorbide mononitrate (IMDUR) 30 MG 24 hr tablet Take 1/2 tablet (15 mg) by mouth at bedtime. 10/06/22   Danford, Earl Lites, MD  metoprolol succinate (TOPROL-XL) 100 MG 24 hr tablet Take 100 mg by mouth every evening. Take with or immediately following a meal.    [provider]  Multiple Vitamins-Minerals (HAIR SKIN AND NAILS FORMULA) TABS Take 1 tablet by mouth daily.    [provider]  potassium chloride SA (KLOR-CON M) 20 MEQ tablet Take 1 tablet (20 mEq) by mouth daily. 10/06/22   Danford, Earl Lites, MD  torsemide (DEMADEX) 20 MG tablet Take 20 mg by mouth 2 (two) times daily. 09/11/22   [provider]      Allergies    Other, Penicillins, Lovastatin, Azithromycin, Clindamycin, Hydralazine, Lisinopril, Neomycin sulfate [neomycin], Niacin, Rofecoxib, Tetracycline, Valsartan, Vytorin [ezetimibe-simvastatin], and Zetia [ezetimibe]    Review of Systems   Review of Systems  Physical Exam Updated Vital Signs BP (!) 126/50  Pulse 90   Temp (!) 101.9 F (38.8 C) (Rectal)   Resp (!) 29   SpO2 97%  Physical Exam Vitals and nursing note reviewed.  Constitutional:      General: She is not in acute distress.    Appearance: She is well-developed.  HENT:     Head: Normocephalic and atraumatic.  Eyes:     Pupils: Pupils are equal, round, and reactive to light.  Cardiovascular:     Rate and Rhythm: Tachycardia present. Rhythm irregularly irregular.     Heart sounds: Normal heart sounds. No murmur heard.    No friction rub.  Pulmonary:     Effort: Pulmonary effort is normal.      Breath sounds: Normal breath sounds. No wheezing or rales.  Abdominal:     General: Bowel sounds are normal. There is no distension.     Palpations: Abdomen is soft.     Tenderness: There is abdominal tenderness in the right lower quadrant. There is no guarding or rebound.  Genitourinary:    Comments: Stool is dark Musculoskeletal:        General: No tenderness. Normal range of motion.     Right lower leg: Edema present.     Left lower leg: Edema present.     Comments: Pitting edema in bilateral lower extremities and skin changes consistent with chronic venous stasis however significant erythema noted in the right lower extremity starting between the thigh and the knee.  No induration, fluctuance or pointing.  Skin:    General: Skin is warm and dry.     Findings: No rash.  Neurological:     Mental Status: She is alert and oriented to person, place, and time.     Cranial Nerves: No cranial nerve deficit.  Psychiatric:        Behavior: Behavior normal.     ED Results / Procedures / Treatments   Labs (all labs ordered are listed, but only abnormal results are displayed) Labs Reviewed  COMPREHENSIVE METABOLIC PANEL - Abnormal; Notable for the following components:      Result Value   CO2 20 (*)    Glucose, Bld 106 (*)    BUN 108 (*)    Creatinine, Ser 3.21 (*)    Calcium 8.8 (*)    Total Protein 5.8 (*)    Albumin 2.7 (*)    Alkaline Phosphatase 26 (*)    Total Bilirubin 1.3 (*)    GFR, Estimated 13 (*)    All other components within normal limits  CBC WITH DIFFERENTIAL/PLATELET - Abnormal; Notable for the following components:   RBC 2.12 (*)    Hemoglobin 6.4 (*)    HCT 21.3 (*)    MCV 100.5 (*)    RDW 15.9 (*)    Neutro Abs 9.2 (*)    Abs Immature Granulocytes 0.09 (*)    All other components within normal limits  PROTIME-INR - Abnormal; Notable for the following components:   Prothrombin Time 17.4 (*)    INR 1.4 (*)    All other components within normal limits   CULTURE, BLOOD (ROUTINE X 2)  CULTURE, BLOOD (ROUTINE X 2)  LACTIC ACID, PLASMA  APTT  LACTIC ACID, PLASMA  URINALYSIS, W/ REFLEX TO CULTURE (INFECTION SUSPECTED)  POC OCCULT BLOOD, ED  TYPE AND SCREEN  PREPARE RBC (CROSSMATCH)    EKG EKG Interpretation  Date/Time:  Friday November 12 2022 16:23:46 EDT Ventricular Rate:  94 PR Interval:    QRS Duration: 101 QT Interval:  353 QTC Calculation: 442 R Axis:   81 Text Interpretation: Atrial fibrillation Borderline right axis deviation Anteroseptal infarct, old T wave inversion Inferolateral leads Unchanged from 08/23/22 Confirmed by Gwyneth Sprout ([id number]) on 11/12/2022 4:37:24 PM  Radiology CT ABDOMEN PELVIS WO CONTRAST  Result Date: 11/12/2022 CLINICAL DATA:  Abdominal pain and dark stools for 1 week EXAM: CT ABDOMEN AND PELVIS WITHOUT CONTRAST TECHNIQUE: Multidetector CT imaging of the abdomen and pelvis was performed following the standard protocol without IV contrast. RADIATION DOSE REDUCTION: This exam was performed according to the departmental dose-optimization program which includes automated exposure control, adjustment of the mA and/or kV according to patient size and/or use of iterative reconstruction technique. COMPARISON:  03/31/2015 FINDINGS: Lower chest: Small right-sided pleural effusion is noted. Mild bibasilar atelectasis is seen. Hepatobiliary: No focal liver abnormality is seen. Status post cholecystectomy. No biliary dilatation. Pancreas: Pancreas is somewhat atrophic.  No focal mass is seen. Spleen: Normal in size without focal abnormality. Adrenals/Urinary Tract: Stable left adrenal adenoma is noted. The right kidney is somewhat ptotic in nature. Cystic changes are seen. No further follow-up is recommended. No obstructive changes are seen. The bladder is well distended without focal abnormality. Left lateral bladder diverticulum is noted Stomach/Bowel: Scattered diverticular change of the colon is noted without evidence of  diverticulitis. Stomach is decompressed. Small bowel appears within normal limits. Vascular/Lymphatic: Aortic atherosclerosis. No enlarged abdominal or pelvic lymph nodes. Reproductive: Status post hysterectomy. No adnexal masses. Other: Laxity of the anterior abdominal wall is noted although no true herniation is seen. This lies in an area of prior umbilical hernia repair. Additionally there is a peritoneal wall defect laterally on the right containing omental fat. No herniated bowel is noted within. This appears overall stable when compared with the prior exam. Musculoskeletal: Postsurgical and degenerative changes of lumbar spine are noted. IMPRESSION: No findings to correspond with the given clinical history of dark tarry stools. Stable right lateral peritoneal wall defect unchanged from prior exam. The previously seen umbilical hernia has been. Although significant laxity of the anterior abdominal wall is noted without definitive herniation. Diverticulosis without diverticulitis. Electronically Signed   By: Alcide Clever M.D.   On: 11/12/2022 19:37   DG Chest Port 1 View  Result Date: 11/12/2022 CLINICAL DATA:  Questionable sepsis. Evaluate for abnormality. Dark stools. Abnormal pain. EXAM: PORTABLE CHEST 1 VIEW COMPARISON:  One-view chest x-ray 3/17/4 FINDINGS: Cardiomegaly is stable. Edema or effusion is present. No focal airspace disease is present. Visualized soft tissues bony thorax are unremarkable. IMPRESSION: 1. Cardiomegaly without failure. 2. Edema or effusion. Electronically Signed   By: Marin Roberts M.D.   On: 11/12/2022 16:25    Procedures Procedures    Medications Ordered in ED Medications  lactated ringers infusion ( Intravenous New Bag/Given 11/12/22 1630)  0.9 %  sodium chloride infusion (Manually program via Guardrails IV Fluids) (has no administration in time range)  lactated ringers bolus 500 mL (0 mLs Intravenous Stopped 11/12/22 1745)  acetaminophen (TYLENOL) tablet  1,000 mg (1,000 mg Oral Given 11/12/22 1627)  cefTRIAXone (ROCEPHIN) 2 g in sodium chloride 0.9 % 100 mL IVPB (0 g Intravenous Stopped 11/12/22 1700)  pantoprazole (PROTONIX) injection 40 mg (40 mg Intravenous Given 11/12/22 1834)    ED Course/ Medical Decision Making/ A&P                             Medical Decision Making Amount and/or Complexity of Data Reviewed Independent  Historian: EMS External Data Reviewed: notes. Labs: ordered. Decision-making details documented in ED Course. Radiology: ordered and independent interpretation performed. Decision-making details documented in ED Course. ECG/medicine tests: ordered and independent interpretation performed. Decision-making details documented in ED Course.  Risk OTC drugs. Prescription drug management. Decision regarding hospitalization.   Pt with multiple medical problems and comorbidities and presenting today with a complaint that caries a high risk for morbidity and mortality.  Here today with complaints of dark stools (has not taking any Pepto-Bismol )and feeling rundown.  However after arrival here noted that patient had significant redness of her leg that appeared to be cellulitis and rectal temperature was 101.9.  Code sepsis was initiated.  Concern for GI bleed patient last took Eliquis yesterday but also concern for possible cellulitis and sepsis.  Lower suspicion for DVT as patient is anticoagulated.  No findings to suggest necrotizing fasciitis or abscess.  She also has pain in her right lower quadrant.  Labs, CT of the abdomen are pending.  Hemoccult is pending. I discussed with pharmacy due to patient's multiple drug allergies and they confirmed that Rocephin was okay to start for cellulitis as she has had it before and tolerated it.  I have independently visualized and interpreted pt's images today.  Chest x-ray today which shows cardiomegaly but no evidence of pleural effusions or pulmonary edema.  I independently entered the  patient's labs and EKG.  EKG with atrial fibrillation which is unchanged from prior, CBC with recurrent anemia with hemoglobin down to 6.4 from 8 prior to discharge and Hemoccult was positive.  CMP with worsening renal function now at 3.2 which is up from 2.69-month ago.  Is been gradually increasing.  Lactate wnl.  7:50 PM Findings discussed with the patient.  Will transfuse 2 units and give Protonix.  Patient has not had Eliquis today and was held while she was in the hospital but she resumed it prior to discharge home.  Will discuss with GI.  Patient will need admission to the hospital for care for GI bleeding as well as sepsis.  She was comfortable with this plan.  7:50 PM Spoke with Dr. Levora Angel with [facility] who will consult on the pt. CT of the abdomen pelvis is negative for acute findings.  Patient admitted for further care.  CRITICAL CARE Performed by: Waylen Depaolo Total critical care time: 45 minutes Critical care time was exclusive of separately billable procedures and treating other patients. Critical care was necessary to treat or prevent imminent or life-threatening deterioration. Critical care was time spent personally by me on the following activities: development of treatment plan with patient and/or surrogate as well as nursing, discussions with consultants, evaluation of patient's response to treatment, examination of patient, obtaining history from patient or surrogate, ordering and performing treatments and interventions, ordering and review of laboratory studies, ordering and review of radiographic studies, pulse oximetry and re-evaluation of patient's condition.        Final Clinical Impression(s) / ED Diagnoses Final diagnoses:  Upper GI bleeding  Symptomatic anemia  Sepsis without acute organ dysfunction, due to unspecified organism (HCC)  Cellulitis of right lower extremity without foot    Rx / DC Orders ED Discharge Orders     None          Gwyneth Sprout, MD 11/12/22 1950

## 2022-11-12 NOTE — Subjective & Objective (Signed)
CC: GI bleeding HPI: 89 year old female history of morbid obesity, bilateral lower extremity lymphedema, chronic diastolic heart failure, CKD stage IV baseline creatinine 2.9-3.1, chronic A-fib on Eliquis, hypertension, presents the ER today with GI bleeding for the last 5 days.  Patient had a GI bleed in February 2024.  She underwent EGD which demonstrated gastric ulcerations.  Pathology was negative for H. pylori.  She was restarted on her outpatient Eliquis.  Patient states that she has been on her Eliquis this whole week.  This is despite having GI bleeding that started on Monday.  Patient's been feeling increasingly weak.  Arrival temp 99.3 which increased to 101.9 heart rate 93 blood pressure 130/53 satting 100% on room air.  Sodium 135, potassium 3.8, chloride 101, bicarb of 20, BUN of 108, creatinine 3.2, glucose 106  White count 10.3, hemoglobin 6.4, platelets of 166  CT abdomen pelvis without contrast demonstrated no acute bleeding.  She is a chronic right lateral peritoneal wall defect unchanged from 03/2015  Chest x-ray demonstrated cardiomegaly without failure.  Patient had melanotic stools.  [facility] hospitalist contacted for admission.

## 2022-11-12 NOTE — Assessment & Plan Note (Signed)
Chronic. 

## 2022-11-12 NOTE — Sepsis Progress Note (Signed)
Sepsis protocol monitored by eLink ?

## 2022-11-12 NOTE — ED Notes (Signed)
ED TO INPATIENT HANDOFF REPORT  ED Nurse Name and Phone #: Gillis Ends [phone]  S Name/Age/Gender Tabitha Black 89 y.o. female Room/Bed: 017C/017C  Code Status   Code Status: Full Code  Home/SNF/Other Skilled nursing facility Patient oriented to: self, place, time, and situation Is this baseline? Yes   Triage Complete: Triage complete  Chief Complaint GI bleeding [K92.2]  Triage Note PT BIB EMS for dark stools and abdominal pain for a week, PT was at the home, states the stools are black, patient reports a history of GI bleeds.   Patient also has her legs wrapped with some weeping from the right leg.    Allergies Allergies  Allergen Reactions   Other Swelling    Topical mycin, unsure of which one, caused opthalmic swelling  Erythromycin    Penicillins Itching, Other (See Comments), Rash, Shortness Of Breath and Swelling   Lovastatin Other (See Comments)    Weakness and myalgias, maybe to statins?   Azithromycin     Patient doesn't remember having a reaction to this.   Clindamycin     Patient doesn't remember having a reaction to this.    Hydralazine     Other reaction(s): felt foggy   Lisinopril Other (See Comments)    Hyperkalemia    Neomycin Sulfate [Neomycin] Other (See Comments)    Redness and burning in face   Niacin Hives    Facial swelling and redness   Rofecoxib Other (See Comments)    Unknown reaction   Tetracycline    Valsartan Other (See Comments)    Hyperkalemia    Vytorin [Ezetimibe-Simvastatin] Other (See Comments)    myalgias   Zetia [Ezetimibe] Other (See Comments)    Leg pain    Level of Care/Admitting Diagnosis ED Disposition     ED Disposition  Admit   Condition  --   Comment  Hospital Area: [facility] [[id number]]  Level of Care: Progressive [102]  Admit to Progressive based on following criteria: GI, ENDOCRINE disease patients with GI bleeding, acute liver failure or pancreatitis, stable with diabetic ketoacidosis  or thyrotoxicosis (hypothyroid) state.  May admit patient to [facility] or [facility] if equivalent level of care is available:: No  Covid Evaluation: Asymptomatic - no recent exposure (last 10 days) testing not required  Diagnosis: GI bleeding [119147]  Admitting Physician: Imogene Burn, ERIC [[id number]]  Attending Physician: Imogene Burn, ERIC [[id number]]  Certification:: I certify this patient will need inpatient services for at least 2 midnights  Estimated Length of Stay: 3          B Medical/Surgery History Past Medical History:  Diagnosis Date   ABLA (acute blood loss anemia) 08/23/2022   CKD (chronic kidney disease)    Diabetes mellitus without complication (HCC)    type II   Gastrointestinal hemorrhage with melena 08/23/2022   GERD (gastroesophageal reflux disease)    High cholesterol    Hypertension    Osteoarthritis    Skin cancer, basal cell    Past Surgical History:  Procedure Laterality Date   ABDOMINAL HYSTERECTOMY N/A    APPENDECTOMY N/A 1953   BACK SURGERY     BIOPSY  08/25/2022   Procedure: BIOPSY;  Surgeon: Charlott Rakes, MD;  Location: [facility] ENDOSCOPY;  Service: Gastroenterology;;   CHOLECYSTECTOMY     COLONOSCOPY N/A 02/22/2008   ESOPHAGOGASTRODUODENOSCOPY (EGD) WITH PROPOFOL N/A 08/25/2022   Procedure: ESOPHAGOGASTRODUODENOSCOPY (EGD) WITH PROPOFOL;  Surgeon: Charlott Rakes, MD;  Location: [facility] ENDOSCOPY;  Service: Gastroenterology;  Laterality: N/A;  TOTAL KNEE ARTHROPLASTY Right    UMBILICAL HERNIA REPAIR N/A 04/02/2015   Procedure: LAPAROSCOPIC UMBILICAL HERNIA REPAIR WITH VENTRALIGHT MESH;  Surgeon: Axel Filler, MD;  Location: [facility] OR;  Service: General;  Laterality: N/A;     A IV Location/Drains/Wounds Patient Lines/Drains/Airways Status     Active Line/Drains/Airways     Name Placement date Placement time Site Days   Peripheral IV 11/12/22 20 G Anterior;Left Forearm 11/12/22  --  Forearm  less than 1   Peripheral IV 11/12/22 20 G Posterior;Right Forearm 11/12/22   1820  Forearm  less than 1   External Urinary Catheter 10/04/22  0200  --  39   Pressure Injury 10/04/22 Buttocks Left Stage 2 -  Partial thickness loss of dermis presenting as a shallow open injury with a red, pink wound bed without slough. 10/04/22  0300  -- 39   Pressure Injury 10/04/22 Buttocks Right Stage 2 -  Partial thickness loss of dermis presenting as a shallow open injury with a red, pink wound bed without slough. 10/04/22  0300  -- 39   Wound / Incision (Open or Dehisced) 08/24/22 Non-pressure wound Pretibial Left;Lateral;Lower 1.3 cms x 2 cms x 0.1 cms likely r/t lymphedema 08/24/22  1000  Pretibial  80            Intake/Output Last 24 hours  Intake/Output Summary (Last 24 hours) at 11/12/2022 2146 Last data filed at 11/12/2022 2128 Gross per 24 hour  Intake 1003.32 ml  Output --  Net 1003.32 ml    Labs/Imaging Results for orders placed or performed during the hospital encounter of 11/12/22 (from the past 48 hour(s))  Lactic acid, plasma     Status: None   Collection Time: 11/12/22  4:02 PM  Result Value Ref Range   Lactic Acid, Venous 1.1 0.5 - 1.9 mmol/L    Comment: Performed at [facility], [address], [place], [place] [address]  Comprehensive metabolic panel     Status: Abnormal   Collection Time: 11/12/22  4:31 PM  Result Value Ref Range   Sodium 135 135 - 145 mmol/L   Potassium 3.8 3.5 - 5.1 mmol/L   Chloride 101 98 - 111 mmol/L   CO2 20 (L) 22 - 32 mmol/L   Glucose, Bld 106 (H) 70 - 99 mg/dL    Comment: Glucose reference range applies only to samples taken after fasting for at least 8 hours.   BUN 108 (H) 8 - 23 mg/dL   Creatinine, Ser 5.78 (H) 0.44 - 1.00 mg/dL   Calcium 8.8 (L) 8.9 - 10.3 mg/dL   Total Protein 5.8 (L) 6.5 - 8.1 g/dL   Albumin 2.7 (L) 3.5 - 5.0 g/dL   AST 38 15 - 41 U/L   ALT 14 0 - 44 U/L   Alkaline Phosphatase 26 (L) 38 - 126 U/L   Total Bilirubin 1.3 (H) 0.3 - 1.2 mg/dL   GFR, Estimated 13 (L) >60 mL/min    Comment:  (NOTE) Calculated using the CKD-EPI Creatinine Equation (2021)    Anion gap 14 5 - 15    Comment: Performed at [facility], [address], [place], [place] [address]  CBC with Differential     Status: Abnormal   Collection Time: 11/12/22  4:31 PM  Result Value Ref Range   WBC 10.3 4.0 - 10.5 K/uL   RBC 2.12 (L) 3.87 - 5.11 MIL/uL   Hemoglobin 6.4 (LL) 12.0 - 15.0 g/dL    Comment:  REPEATED TO VERIFY THIS CRITICAL RESULT HAS VERIFIED AND BEEN CALLED TO K.OBER,RN BY JOHN VANG ON 04 26 2024 AT 1730, AND HAS BEEN READ BACK.     HCT 21.3 (L) 36.0 - 46.0 %   MCV 100.5 (H) 80.0 - 100.0 fL   MCH 30.2 26.0 - 34.0 pg   MCHC 30.0 30.0 - 36.0 g/dL   RDW 16.1 (H) 09.6 - 04.5 %   Platelets 166 150 - 400 K/uL   nRBC 0.0 0.0 - 0.2 %   Neutrophils Relative % 89 %   Neutro Abs 9.2 (H) 1.7 - 7.7 K/uL   Lymphocytes Relative 8 %   Lymphs Abs 0.8 0.7 - 4.0 K/uL   Monocytes Relative 2 %   Monocytes Absolute 0.3 0.1 - 1.0 K/uL   Eosinophils Relative 0 %   Eosinophils Absolute 0.0 0.0 - 0.5 K/uL   Basophils Relative 0 %   Basophils Absolute 0.0 0.0 - 0.1 K/uL   Immature Granulocytes 1 %   Abs Immature Granulocytes 0.09 (H) 0.00 - 0.07 K/uL    Comment: Performed at [facility], [address], [place], [place] [address]  Protime-INR     Status: Abnormal   Collection Time: 11/12/22  4:31 PM  Result Value Ref Range   Prothrombin Time 17.4 (H) 11.4 - 15.2 seconds   INR 1.4 (H) 0.8 - 1.2    Comment: (NOTE) INR goal varies based on device and disease states. Performed at [facility], [address], [place], [place] [address]   APTT     Status: None   Collection Time: 11/12/22  4:31 PM  Result Value Ref Range   aPTT 33 24 - 36 seconds    Comment: Performed at [facility], [address] [place], [place] [address]  Prepare RBC (crossmatch)     Status: None   Collection Time: 11/12/22  5:34 PM  Result Value Ref Range   Order Confirmation      ORDERS RECEIVED TO  CROSSMATCH Performed at [facility], [address] [place], [place] [address]   Type and screen [facility]     Status: None (Preliminary result)   Collection Time: 11/12/22  5:50 PM  Result Value Ref Range   ABO/RH(D) O POS    Antibody Screen NEG    Sample Expiration 11/15/2022,2359    Unit Number [id number]    Blood Component Type RBC LR PHER1    Unit division 00    Status of Unit ISSUED    Transfusion Status OK TO TRANSFUSE    Crossmatch Result Compatible    Unit Number [id number]    Blood Component Type RED CELLS,LR    Unit division 00    Status of Unit ISSUED    Transfusion Status OK TO TRANSFUSE    Crossmatch Result      Compatible Performed at [facility], [address], [place], [place] [address]    CT ABDOMEN PELVIS WO CONTRAST  Result Date: 11/12/2022 CLINICAL DATA:  Abdominal pain and dark stools for 1 week EXAM: CT ABDOMEN AND PELVIS WITHOUT CONTRAST TECHNIQUE: Multidetector CT imaging of the abdomen and pelvis was performed following the standard protocol without IV contrast. RADIATION DOSE REDUCTION: This exam was performed according to the departmental dose-optimization program which includes automated exposure control, adjustment of the mA and/or kV according to patient size and/or use of iterative reconstruction technique. COMPARISON:  03/31/2015 FINDINGS: Lower chest: Small right-sided  pleural effusion is noted. Mild bibasilar atelectasis is seen. Hepatobiliary: No focal liver abnormality is seen. Status post cholecystectomy. No biliary dilatation. Pancreas: Pancreas is somewhat atrophic.  No focal mass is seen. Spleen: Normal in size without focal abnormality. Adrenals/Urinary Tract: Stable left adrenal adenoma is noted. The right kidney is somewhat ptotic in nature. Cystic changes are seen. No further follow-up is recommended. No obstructive changes are seen. The bladder is well distended without focal abnormality. Left lateral  bladder diverticulum is noted Stomach/Bowel: Scattered diverticular change of the colon is noted without evidence of diverticulitis. Stomach is decompressed. Small bowel appears within normal limits. Vascular/Lymphatic: Aortic atherosclerosis. No enlarged abdominal or pelvic lymph nodes. Reproductive: Status post hysterectomy. No adnexal masses. Other: Laxity of the anterior abdominal wall is noted although no true herniation is seen. This lies in an area of prior umbilical hernia repair. Additionally there is a peritoneal wall defect laterally on the right containing omental fat. No herniated bowel is noted within. This appears overall stable when compared with the prior exam. Musculoskeletal: Postsurgical and degenerative changes of lumbar spine are noted. IMPRESSION: No findings to correspond with the given clinical history of dark tarry stools. Stable right lateral peritoneal wall defect unchanged from prior exam. The previously seen umbilical hernia has been. Although significant laxity of the anterior abdominal wall is noted without definitive herniation. Diverticulosis without diverticulitis. Electronically Signed   By: Alcide Clever M.D.   On: 11/12/2022 19:37   DG Chest Port 1 View  Result Date: 11/12/2022 CLINICAL DATA:  Questionable sepsis. Evaluate for abnormality. Dark stools. Abnormal pain. EXAM: PORTABLE CHEST 1 VIEW COMPARISON:  One-view chest x-ray 3/17/4 FINDINGS: Cardiomegaly is stable. Edema or effusion is present. No focal airspace disease is present. Visualized soft tissues bony thorax are unremarkable. IMPRESSION: 1. Cardiomegaly without failure. 2. Edema or effusion. Electronically Signed   By: Marin Roberts M.D.   On: 11/12/2022 16:25    Pending Labs Unresulted Labs (From admission, onward)     Start     Ordered   11/13/22 0500  Comprehensive metabolic panel  Tomorrow morning,   R        11/12/22 2137   11/13/22 0500  Magnesium  Tomorrow morning,   R        11/12/22 2137    11/13/22 0500  CBC with Differential/Platelet  Tomorrow morning,   R        11/12/22 2137   11/12/22 1603  Urinalysis, w/ Reflex to Culture (Infection Suspected) -Urine, Clean Catch  Once,   URGENT       Question:  Specimen Source  Answer:  Urine, Clean Catch   11/12/22 1603   11/12/22 1602  Lactic acid, plasma  (Septic presentation on arrival (screening labs, nursing and treatment orders for obvious sepsis))  Now then every 2 hours,   R      11/12/22 1603   11/12/22 1602  Blood Culture (routine x 2)  (Septic presentation on arrival (screening labs, nursing and treatment orders for obvious sepsis))  BLOOD CULTURE X 2,   STAT      11/12/22 1603            Vitals/Pain Today's Vitals   11/12/22 2030 11/12/22 2045 11/12/22 2100 11/12/22 2115  BP: (!) 131/50 128/62 135/86 (!) 121/47  Pulse: 92 88 94 63  Resp: (!) 24 (!) 26 (!) 23 20  Temp:    97.9 F (36.6 C)  TempSrc:    Oral  SpO2: 100% 100%  100% 99%  PainSc:        Isolation Precautions No active isolations  Medications Medications  0.9 %  sodium chloride infusion (Manually program via Guardrails IV Fluids) (has no administration in time range)  pantoprozole (PROTONIX) 80 mg /NS 100 mL infusion (has no administration in time range)  pantoprazole (PROTONIX) injection 40 mg (has no administration in time range)  pantoprazole (PROTONIX) injection 40 mg (has no administration in time range)  metoprolol succinate (TOPROL-XL) 24 hr tablet 50 mg (has no administration in time range)  furosemide (LASIX) injection 80 mg (has no administration in time range)  cephALEXin (KEFLEX) capsule 500 mg (has no administration in time range)  acetaminophen (TYLENOL) tablet 650 mg (has no administration in time range)    Or  acetaminophen (TYLENOL) suppository 650 mg (has no administration in time range)  ondansetron (ZOFRAN) tablet 4 mg (has no administration in time range)    Or  ondansetron (ZOFRAN) injection 4 mg (has no administration  in time range)  lactated ringers bolus 500 mL (0 mLs Intravenous Stopped 11/12/22 1745)  acetaminophen (TYLENOL) tablet 1,000 mg (1,000 mg Oral Given 11/12/22 1627)  cefTRIAXone (ROCEPHIN) 2 g in sodium chloride 0.9 % 100 mL IVPB (0 g Intravenous Stopped 11/12/22 1700)  pantoprazole (PROTONIX) injection 40 mg (40 mg Intravenous Given 11/12/22 1834)    Mobility non-ambulatory     Focused Assessments Cardiac Assessment Handoff:    No results found for: "CKTOTAL", "CKMB", "CKMBINDEX", "TROPONINI" Lab Results  Component Value Date   DDIMER 1.10 (H) 11/30/2016   Does the Patient currently have chest pain? No    R Recommendations: See Admitting Provider Note  Report given to:   Additional Notes: 1st unit RBCs started, 2nd unit will be transported in cooler with pt

## 2022-11-12 NOTE — ED Notes (Signed)
Attempted to update her sister Steward Drone per pt request. Called her at [phone] twice but was unable to speak with her.

## 2022-11-12 NOTE — ED Triage Notes (Signed)
PT BIB EMS for dark stools and abdominal pain for a week, PT was at the home, states the stools are black, patient reports a history of GI bleeds.   Patient also has her legs wrapped with some weeping from the right leg.

## 2022-11-12 NOTE — ED Notes (Signed)
Lab called critical result Hemoglobin of 6.4. Anitra Lauth, MD notified

## 2022-11-12 NOTE — Assessment & Plan Note (Signed)
Add SSI q4h.

## 2022-11-12 NOTE — Assessment & Plan Note (Signed)
Hold BP meds until anemia corrected.

## 2022-11-12 NOTE — Assessment & Plan Note (Signed)
Continue Toprol-xL for rate control. Holding Eliquis due to GI bleeding.

## 2022-11-12 NOTE — Assessment & Plan Note (Signed)
Admit to progressive bed. Transfuse 2 units PRBC. Give IV lasix 80 mg in between PRBC units. GI consulted by EDP. Keep NPO. On IV protonix gtts. Hx of gastric ulcers on EGD in 08-2022.

## 2022-11-12 NOTE — Assessment & Plan Note (Signed)
Will need diuresis after PRBC transfusion.

## 2022-11-12 NOTE — Assessment & Plan Note (Signed)
Chronic.  11/12/2022 04/19/2022

## 2022-11-12 NOTE — ED Notes (Signed)
Report called to unit RN. °

## 2022-11-12 NOTE — Assessment & Plan Note (Signed)
Start po keflex 500 mg q8h

## 2022-11-12 NOTE — Assessment & Plan Note (Signed)
Chronic. Baseline Scr 2.9-3.1

## 2022-11-12 NOTE — Progress Notes (Signed)
Pls call [phone]

## 2022-11-13 DIAGNOSIS — K922 Gastrointestinal hemorrhage, unspecified: Secondary | ICD-10-CM | POA: Diagnosis not present

## 2022-11-13 LAB — COMPREHENSIVE METABOLIC PANEL
ALT: 15 U/L (ref 0–44)
AST: 30 U/L (ref 15–41)
Albumin: 2.4 g/dL — ABNORMAL LOW (ref 3.5–5.0)
Alkaline Phosphatase: 26 U/L — ABNORMAL LOW (ref 38–126)
Anion gap: 13 (ref 5–15)
BUN: 99 mg/dL — ABNORMAL HIGH (ref 8–23)
CO2: 20 mmol/L — ABNORMAL LOW (ref 22–32)
Calcium: 8.6 mg/dL — ABNORMAL LOW (ref 8.9–10.3)
Chloride: 102 mmol/L (ref 98–111)
Creatinine, Ser: 3.19 mg/dL — ABNORMAL HIGH (ref 0.44–1.00)
GFR, Estimated: 14 mL/min — ABNORMAL LOW (ref >60)
Glucose, Bld: 105 mg/dL — ABNORMAL HIGH (ref 70–99)
Potassium: 3.2 mmol/L — ABNORMAL LOW (ref 3.5–5.1)
Sodium: 135 mmol/L (ref 135–145)
Total Bilirubin: 1.7 mg/dL — ABNORMAL HIGH (ref 0.3–1.2)
Total Protein: 5.3 g/dL — ABNORMAL LOW (ref 6.5–8.1)

## 2022-11-13 LAB — URINALYSIS, W/ REFLEX TO CULTURE (INFECTION SUSPECTED)
Bilirubin Urine: NEGATIVE
Glucose, UA: NEGATIVE mg/dL
Hgb urine dipstick: NEGATIVE
Ketones, ur: NEGATIVE mg/dL
Leukocytes,Ua: NEGATIVE
Nitrite: NEGATIVE
Protein, ur: NEGATIVE mg/dL
Specific Gravity, Urine: 1.01 (ref 1.005–1.030)
pH: 5 (ref 5.0–8.0)

## 2022-11-13 LAB — BPAM RBC: ISSUE DATE / TIME: 202404262118

## 2022-11-13 LAB — TYPE AND SCREEN
Unit division: 0
Unit division: 0

## 2022-11-13 LAB — MAGNESIUM: Magnesium: 1.7 mg/dL (ref 1.7–2.4)

## 2022-11-13 LAB — GLUCOSE, CAPILLARY
Glucose-Capillary: 104 mg/dL — ABNORMAL HIGH (ref 70–99)
Glucose-Capillary: 108 mg/dL — ABNORMAL HIGH (ref 70–99)
Glucose-Capillary: 112 mg/dL — ABNORMAL HIGH (ref 70–99)
Glucose-Capillary: 131 mg/dL — ABNORMAL HIGH (ref 70–99)
Glucose-Capillary: 98 mg/dL (ref 70–99)
Glucose-Capillary: 98 mg/dL (ref 70–99)

## 2022-11-13 LAB — CBC WITH DIFFERENTIAL/PLATELET
Abs Immature Granulocytes: 0.11 10*3/uL — ABNORMAL HIGH (ref 0.00–0.07)
Basophils Absolute: 0 10*3/uL (ref 0.0–0.1)
Basophils Relative: 0 %
Eosinophils Absolute: 0 10*3/uL (ref 0.0–0.5)
Eosinophils Relative: 0 %
HCT: 26.2 % — ABNORMAL LOW (ref 36.0–46.0)
Hemoglobin: 8.3 g/dL — ABNORMAL LOW (ref 12.0–15.0)
Immature Granulocytes: 1 %
Lymphocytes Relative: 9 %
Lymphs Abs: 1 10*3/uL (ref 0.7–4.0)
MCH: 29.6 pg (ref 26.0–34.0)
MCHC: 31.7 g/dL (ref 30.0–36.0)
MCV: 93.6 fL (ref 80.0–100.0)
Monocytes Absolute: 0.3 10*3/uL (ref 0.1–1.0)
Monocytes Relative: 2 %
Neutro Abs: 8.9 10*3/uL — ABNORMAL HIGH (ref 1.7–7.7)
Neutrophils Relative %: 88 %
Platelets: 161 10*3/uL (ref 150–400)
RBC: 2.8 MIL/uL — ABNORMAL LOW (ref 3.87–5.11)
RDW: 17.2 % — ABNORMAL HIGH (ref 11.5–15.5)
WBC: 10.2 10*3/uL (ref 4.0–10.5)
nRBC: 0 % (ref 0.0–0.2)

## 2022-11-13 LAB — LACTIC ACID, PLASMA: Lactic Acid, Venous: 0.7 mmol/L (ref 0.5–1.9)

## 2022-11-13 LAB — CULTURE, BLOOD (ROUTINE X 2)

## 2022-11-13 MED ORDER — MAGNESIUM SULFATE 2 GM/50ML IV SOLN
2.0000 g | Freq: Once | INTRAVENOUS | Status: AC
  Administered 2022-11-13: 2 g via INTRAVENOUS
  Filled 2022-11-13: qty 50

## 2022-11-13 MED ORDER — POTASSIUM CHLORIDE CRYS ER 20 MEQ PO TBCR
40.0000 meq | EXTENDED_RELEASE_TABLET | Freq: Once | ORAL | Status: AC
  Administered 2022-11-13: 40 meq via ORAL
  Filled 2022-11-13: qty 2

## 2022-11-13 NOTE — Plan of Care (Signed)
  Problem: Education: Goal: Ability to describe self-care measures that may prevent or decrease complications (Diabetes Survival Skills Education) will improve 11/13/2022 0016 by Elnita Maxwell, RN Outcome: Progressing 11/13/2022 0015 by Elnita Maxwell, RN Outcome: Progressing Goal: Individualized Educational Video(s) 11/13/2022 0016 by Elnita Maxwell, RN Outcome: Progressing 11/13/2022 0015 by Elnita Maxwell, RN Outcome: Progressing   Problem: Coping: Goal: Ability to adjust to condition or change in health will improve 11/13/2022 0016 by Elnita Maxwell, RN Outcome: Progressing 11/13/2022 0015 by Elnita Maxwell, RN Outcome: Progressing   Problem: Fluid Volume: Goal: Ability to maintain a balanced intake and output will improve 11/13/2022 0016 by Elnita Maxwell, RN Outcome: Progressing 11/13/2022 0015 by Elnita Maxwell, RN Outcome: Progressing

## 2022-11-13 NOTE — Progress Notes (Signed)
PROGRESS NOTE    Tabitha Black  ZOX:[id number] DOB: September 22, 1932 DOA: 11/12/2022 PCP: Irven Coe, MD   Brief Narrative:  This 89 year old female with PMH significant for morbid obesity, bilateral lower extremity lymphedema, Chronic diastolic heart failure, CKD stage IV with baseline creatinine 2.9-3.1, chronic A-fib on Eliquis, hypertension, presented in the ED  with GI bleeding for the last 5 days. Patient had a GI bleed in February 2024. She underwent EGD which demonstrated gastric ulcerations. Pathology was negative for H. pylori. She was restarted on her outpatient Eliquis. Patient states that she has been on her Eliquis this whole week. This is despite having GI bleeding that started on Monday. Patient reports feeling fatigued and weak.  She is found to have hemoglobin 6.4.  S/p 2 unit PRBC.  CT abdomen and pelvis demonstrated no acute bleeding.  Patient is admitted for further evaluation.  GI is consulted and scheduled for EGD tomorrow.  Eliquis is on hold  Assessment & Plan:   Principal Problem:   GI bleeding Active Problems:   Cellulitis of right leg   Chronic diastolic CHF (congestive heart failure) (HCC)   Chronic kidney disease, stage 4 (severe) (HCC) -Baseline Scr 2.9-3.1   Gastroesophageal reflux disease   Morbid obesity (HCC)   HTN (hypertension)   A-fib (HCC)   DM2 (diabetes mellitus, type 2) (HCC)   Lymphedema of both lower extremities - R > L  GI bleeding: Patient reports having black-colored stools in the setting of Eliquis use. She reports having bleeding for last 1 week, She continued to take Eliquis. Hemoglobin on arrival 6.4, transfused 2 unit PRBC. Hemoglobin s/p transfusion improved to 8.2.  GI is consulted.   Patient is scheduled for EGD in the morning.  Continue Eliquis on hold.  Cellulitis of right leg Continue Keflex 500 every 8 hours.  Lymphedema of both lower extremities:  Chronic , stable.  Diabetes mellitus with hyperglycemia: Continue sliding  scale. Carb modified diet. Obtain Hb A1c  Atrial fibrillation: Heart rate well-controlled. Continue Toprol-XL Hold Eliquis due to GI bleeding  Essential hypertension: Resume blood pressure medications.  Chronic kidney disease stage IV: Serum creatinine at baseline. Avoid nephrotoxic medications.  Chronic diastolic CHF: Does not appear euvolemic.   Continue diuresis in between and after transfusion.   DVT prophylaxis:SCDs Code Status:Full code. Family Communication: No family at bed side. Disposition Plan:   Status is: Inpatient Remains inpatient appropriate because: Admitted for GI bleeding in the setting of Eliquis use.  GI is consulted and scheduled for EGD tomorrow    Consultants:  Gastroenterology  Procedures: Scheduled EGD tomorrow Antimicrobials: Keflex p.o.  Subjective: Patient was seen and examined at bedside.  Overnight events noted.   Patient appears anxious,  states she has not had a bowel movement today.   She is scared to have colonoscopy.  Objective: Vitals:   11/13/22 0215 11/13/22 0500 11/13/22 0524 11/13/22 0836  BP: (!) 127/59  (!) 149/58 (!) 140/47  Pulse: 82  88 86  Resp: (!) 21  19 19   Temp: 98.2 F (36.8 C)  98.4 F (36.9 C) 98.4 F (36.9 C)  TempSrc: Oral  Oral Oral  SpO2: 99%  100% 97%  Weight:  75.6 kg    Height:  5\' 3"  (1.6 m)      Intake/Output Summary (Last 24 hours) at 11/13/2022 1105 Last data filed at 11/13/2022 0523 Gross per 24 hour  Intake 2076.54 ml  Output 1200 ml  Net 876.54 ml   [employer]  11/13/22 0500  Weight: 75.6 kg    Examination:  General exam: Appears calm and comfortable, deconditioned, not in any distress. Respiratory system: CTA Bilaterally. Respiratory effort normal.  RR 15 Cardiovascular system: S1 & S2 heard, Irregular rhythm, no murmur. Gastrointestinal system: Abdomen is soft, non tender, non distended, BS+ Central nervous system: Alert and oriented x 3. No focal neurological  deficits. Extremities: Chronic lymphedematous changes with slight erythema. Skin: No rashes, lesions or ulcers Psychiatry: Judgement and insight appear normal. Mood & affect appropriate.     Data Reviewed: I have personally reviewed following labs and imaging studies  CBC: Recent Labs  Lab 11/09/22 1244 11/12/22 1631 11/13/22 0422  WBC  --  10.3 10.2  NEUTROABS  --  9.2* 8.9*  HGB 8.2* 6.4* 8.3*  HCT  --  21.3* 26.2*  MCV  --  100.5* 93.6  PLT  --  166 161   Basic Metabolic Panel: Recent Labs  Lab 11/09/22 1300 11/12/22 1631 11/13/22 0422  NA 134* 135 135  K 4.0 3.8 3.2*  CL 99 101 102  CO2 23 20* 20*  GLUCOSE 117* 106* 105*  BUN 97* 108* 99*  CREATININE 3.09* 3.21* 3.19*  CALCIUM 9.3 8.8* 8.6*  MG 2.0  --  1.7  PHOS 4.5  --   --    GFR: Estimated Creatinine Clearance: 12.1 mL/min (A) (by C-G formula based on SCr of 3.19 mg/dL (H)). Liver Function Tests: Recent Labs  Lab 11/09/22 1300 11/12/22 1631 11/13/22 0422  AST  --  38 30  ALT  --  14 15  ALKPHOS  --  26* 26*  BILITOT  --  1.3* 1.7*  PROT  --  5.8* 5.3*  ALBUMIN 3.2* 2.7* 2.4*   No results for input(s): "LIPASE", "AMYLASE" in the last 168 hours. No results for input(s): "AMMONIA" in the last 168 hours. Coagulation Profile: Recent Labs  Lab 11/12/22 1631  INR 1.4*   Cardiac Enzymes: No results for input(s): "CKTOTAL", "CKMB", "CKMBINDEX", "TROPONINI" in the last 168 hours. BNP (last 3 results) No results for input(s): "PROBNP" in the last 8760 hours. HbA1C: No results for input(s): "HGBA1C" in the last 72 hours. CBG: Recent Labs  Lab 11/13/22 0003 11/13/22 0609 11/13/22 0632  GLUCAP 108* 98 98   Lipid Profile: No results for input(s): "CHOL", "HDL", "LDLCALC", "TRIG", "CHOLHDL", "LDLDIRECT" in the last 72 hours. Thyroid Function Tests: No results for input(s): "TSH", "T4TOTAL", "FREET4", "T3FREE", "THYROIDAB" in the last 72 hours. Anemia Panel: No results for input(s):  "VITAMINB12", "FOLATE", "FERRITIN", "TIBC", "IRON", "RETICCTPCT" in the last 72 hours. Sepsis Labs: Recent Labs  Lab 11/12/22 1602 11/13/22 0422  LATICACIDVEN 1.1 0.7    Recent Results (from the past 240 hour(s))  Blood Culture (routine x 2)     Status: None (Preliminary result)   Collection Time: 11/12/22  4:31 PM   Specimen: BLOOD  Result Value Ref Range Status   Specimen Description BLOOD SITE NOT SPECIFIED  Final   Special Requests   Final    BOTTLES DRAWN AEROBIC AND ANAEROBIC Blood Culture results may not be optimal due to an inadequate volume of blood received in culture bottles   Culture   Final    NO GROWTH < 24 HOURS Performed at [facility], [address] [place], [place] [address]    Report Status PENDING  Incomplete  Blood Culture (routine x 2)     Status: None (Preliminary result)   Collection Time: 11/12/22  5:20 PM  Specimen: BLOOD RIGHT ARM  Result Value Ref Range Status   Specimen Description BLOOD RIGHT ARM  Final   Special Requests   Final    BOTTLES DRAWN AEROBIC AND ANAEROBIC Blood Culture adequate volume   Culture   Final    NO GROWTH < 24 HOURS Performed at [facility], [address] [place], [place] [address]    Report Status PENDING  Incomplete    Radiology Studies: CT ABDOMEN PELVIS WO CONTRAST  Result Date: 11/12/2022 CLINICAL DATA:  Abdominal pain and dark stools for 1 week EXAM: CT ABDOMEN AND PELVIS WITHOUT CONTRAST TECHNIQUE: Multidetector CT imaging of the abdomen and pelvis was performed following the standard protocol without IV contrast. RADIATION DOSE REDUCTION: This exam was performed according to the departmental dose-optimization program which includes automated exposure control, adjustment of the mA and/or kV according to patient size and/or use of iterative reconstruction technique. COMPARISON:  03/31/2015 FINDINGS: Lower chest: Small right-sided pleural effusion is noted. Mild bibasilar atelectasis is seen.  Hepatobiliary: No focal liver abnormality is seen. Status post cholecystectomy. No biliary dilatation. Pancreas: Pancreas is somewhat atrophic.  No focal mass is seen. Spleen: Normal in size without focal abnormality. Adrenals/Urinary Tract: Stable left adrenal adenoma is noted. The right kidney is somewhat ptotic in nature. Cystic changes are seen. No further follow-up is recommended. No obstructive changes are seen. The bladder is well distended without focal abnormality. Left lateral bladder diverticulum is noted Stomach/Bowel: Scattered diverticular change of the colon is noted without evidence of diverticulitis. Stomach is decompressed. Small bowel appears within normal limits. Vascular/Lymphatic: Aortic atherosclerosis. No enlarged abdominal or pelvic lymph nodes. Reproductive: Status post hysterectomy. No adnexal masses. Other: Laxity of the anterior abdominal wall is noted although no true herniation is seen. This lies in an area of prior umbilical hernia repair. Additionally there is a peritoneal wall defect laterally on the right containing omental fat. No herniated bowel is noted within. This appears overall stable when compared with the prior exam. Musculoskeletal: Postsurgical and degenerative changes of lumbar spine are noted. IMPRESSION: No findings to correspond with the given clinical history of dark tarry stools. Stable right lateral peritoneal wall defect unchanged from prior exam. The previously seen umbilical hernia has been. Although significant laxity of the anterior abdominal wall is noted without definitive herniation. Diverticulosis without diverticulitis. Electronically Signed   By: Alcide Clever M.D.   On: 11/12/2022 19:37   DG Chest Port 1 View  Result Date: 11/12/2022 CLINICAL DATA:  Questionable sepsis. Evaluate for abnormality. Dark stools. Abnormal pain. EXAM: PORTABLE CHEST 1 VIEW COMPARISON:  One-view chest x-ray 3/17/4 FINDINGS: Cardiomegaly is stable. Edema or effusion is  present. No focal airspace disease is present. Visualized soft tissues bony thorax are unremarkable. IMPRESSION: 1. Cardiomegaly without failure. 2. Edema or effusion. Electronically Signed   By: Marin Roberts M.D.   On: 11/12/2022 16:25    Scheduled Meds:  cephALEXin  500 mg Oral Q8H   insulin aspart  0-5 Units Subcutaneous QHS   insulin aspart  0-9 Units Subcutaneous TID WC   metoprolol succinate  50 mg Oral QPM   [START ON 11/16/2022] pantoprazole  40 mg Intravenous Q12H   Continuous Infusions:  pantoprazole 8 mg/hr (11/13/22 0609)     LOS: 1 day    Time spent: 50 mins    Willeen Niece, MD [facility]   If 7PM-7AM, please contact night-coverage

## 2022-11-13 NOTE — H&P (View-Only) (Signed)
Referring Provider: ED Primary Care Physician:  Hammer, Eli, MD Primary Gastroenterologist: [facility]  Reason for Consultation: GI bleed  HPI: Tabitha Black is a 89 y.o. female with past medical history of chronic A-fib currently on Eliquis, lower extremity lymphedema, history of chronic kidney disease, chronic CHF, morbid obesity presented to the hospital with dark stools and weakness.  She was admitted to the hospital in February 2024 with melena.  EGD at that time showed gastritis with shallow ulcerations.  Biopsies were negative for H. pylori.  Hemoglobin was 10.1 earlier this month.  Hemoglobin on admission yesterday was 6.4.  Normal LFTs.  Mild elevated INR at 1.4.  CT abdomen pelvis without contrast showed no acute changes.  Patient seen and examined at bedside.  According to her, she has been having intermittent black stool since last 1 week.  Denies any vomiting.  Complaining of nausea and weakness.  Complaining of right lower quadrant discomfort also for last 1 week.  Past Medical History:  Diagnosis Date   ABLA (acute blood loss anemia) 08/23/2022   CKD (chronic kidney disease)    Diabetes mellitus without complication (HCC)    type II   Gastrointestinal hemorrhage with melena 08/23/2022   GERD (gastroesophageal reflux disease)    High cholesterol    Hypertension    Osteoarthritis    Skin cancer, basal cell     Past Surgical History:  Procedure Laterality Date   ABDOMINAL HYSTERECTOMY N/A    APPENDECTOMY N/A 1953   BACK SURGERY     BIOPSY  08/25/2022   Procedure: BIOPSY;  Surgeon: Schooler, Vincent, MD;  Location: [facility];  Service: Gastroenterology;;   CHOLECYSTECTOMY     COLONOSCOPY N/A 02/22/2008   ESOPHAGOGASTRODUODENOSCOPY (EGD) WITH PROPOFOL N/A 08/25/2022   Procedure: ESOPHAGOGASTRODUODENOSCOPY (EGD) WITH PROPOFOL;  Surgeon: Schooler, Vincent, MD;  Location: [facility];  Service: Gastroenterology;  Laterality: N/A;   TOTAL KNEE ARTHROPLASTY Right    UMBILICAL  HERNIA REPAIR N/A 04/02/2015   Procedure: LAPAROSCOPIC UMBILICAL HERNIA REPAIR WITH VENTRALIGHT MESH;  Surgeon: Armando Ramirez, MD;  Location: [facility] OR;  Service: General;  Laterality: N/A;    Prior to Admission medications   Medication Sig Start Date End Date Taking? Authorizing Provider  acetaminophen (TYLENOL) 325 MG tablet Take 2 tablets (650 mg total) by mouth every 6 (six) hours as needed for mild pain (or Fever >/= 101). 08/27/22   Ghimire, Shanker M, MD  apixaban (ELIQUIS) 2.5 MG TABS tablet Take 1 tablet (2.5 mg total) by mouth 2 (two) times daily. 06/08/22   Nishan, Peter C, MD  benzonatate (TESSALON) 200 MG capsule Take 1 capsule (200 mg) by mouth 3 times daily as needed for cough. 10/06/22   Danford, Christopher P, MD  Cholecalciferol (VITAMIN D3) 125 MCG (5000 UT) TABS Take 5,000 Units by mouth daily.    [provider]  cyclobenzaprine (FLEXERIL) 10 MG tablet Take 5 mg by mouth daily as needed for muscle spasms.    [provider]  doxazosin (CARDURA) 4 MG tablet Take 4 mg by mouth daily.    [provider]  gemfibrozil (LOPID) 600 MG tablet Take 600 mg by mouth daily. 05/08/20   [provider]  guaiFENesin 200 MG tablet Take 1 tablet (200 mg total) by mouth every 4 (four) hours as needed for cough or to loosen phlegm. 10/06/22   Danford, Christopher P, MD  Iron-FA-B Cmp-C-Biot-Probiotic (FUSION PLUS) CAPS Take 1 capsule by mouth daily. 04/19/20   [provider]  isosorbide   mononitrate (IMDUR) 30 MG 24 hr tablet Take 1/2 tablet (15 mg) by mouth at bedtime. 10/06/22   Danford, Christopher P, MD  metoprolol succinate (TOPROL-XL) 100 MG 24 hr tablet Take 100 mg by mouth every evening. Take with or immediately following a meal.    [provider]  Multiple Vitamins-Minerals (HAIR SKIN AND NAILS FORMULA) TABS Take 1 tablet by mouth daily.    [provider]  potassium chloride SA (KLOR-CON M) 20 MEQ tablet Take 1 tablet (20 mEq) by  mouth daily. 10/06/22   Danford, Christopher P, MD  torsemide (DEMADEX) 20 MG tablet Take 20 mg by mouth 2 (two) times daily. 09/11/22   [provider]    Scheduled Meds:  cephALEXin  500 mg Oral Q8H   insulin aspart  0-5 Units Subcutaneous QHS   insulin aspart  0-9 Units Subcutaneous TID WC   metoprolol succinate  50 mg Oral QPM   [START ON 11/16/2022] pantoprazole  40 mg Intravenous Q12H   Continuous Infusions:  pantoprazole 8 mg/hr (11/13/22 0609)   PRN Meds:.acetaminophen **OR** acetaminophen, ondansetron **OR** ondansetron (ZOFRAN) IV  Allergies as of 11/12/2022 - Review Complete 11/12/2022  Allergen Reaction Noted   Other Swelling 03/31/2015   Penicillins Itching, Other (See Comments), Rash, Shortness Of Breath, and Swelling 03/31/2015   Lovastatin Other (See Comments) 03/31/2015   Azithromycin  03/20/2021   Clindamycin  03/20/2021   Hydralazine  05/25/2021   Lisinopril Other (See Comments) 11/29/2016   Neomycin sulfate [neomycin] Other (See Comments) 11/29/2016   Niacin Hives 11/29/2016   Rofecoxib Other (See Comments) 05/16/2020   Tetracycline  03/20/2021   Valsartan Other (See Comments) 11/29/2016   Vytorin [ezetimibe-simvastatin] Other (See Comments) 11/29/2016   Zetia [ezetimibe] Other (See Comments) 11/29/2016    Family History  Problem Relation Age of Onset   Heart failure Mother    Hypertension Mother    Heart attack Father    Prostate cancer Father    Breast cancer Sister     Social History   Socioeconomic History   Marital status: Married    Spouse name: Not on file   Number of children: Not on file   Years of education: Not on file   Highest education level: Not on file  Occupational History   Not on file  Tobacco Use   Smoking status: Never   Smokeless tobacco: Never  Substance and Sexual Activity   Alcohol use: Yes    Comment: twice a year   Drug use: No   Sexual activity: Not on file  Other Topics Concern   Not on file  Social  History Narrative   Not on file   Social Determinants of Health   Financial Resource Strain: Not on file  Food Insecurity: No Food Insecurity (11/12/2022)   Hunger Vital Sign    Worried About Running Out of Food in the Last Year: Never true    Ran Out of Food in the Last Year: Never true  Transportation Needs: No Transportation Needs (11/12/2022)   PRAPARE - Transportation    Lack of Transportation (Medical): No    Lack of Transportation (Non-Medical): No  Physical Activity: Not on file  Stress: Not on file  Social Connections: Not on file  Intimate Partner Violence: Not At Risk (11/12/2022)   Humiliation, Afraid, Rape, and Kick questionnaire    Fear of Current or Ex-Partner: No    Emotionally Abused: No    Physically Abused: No    Sexually Abused: No      Review of Systems: All negative except as stated above in HPI.  Physical Exam: Vital signs: Vitals:   11/13/22 0215 11/13/22 0524  BP: (!) 127/59 (!) 149/58  Pulse: 82 88  Resp: (!) 21 19  Temp: 98.2 F (36.8 C) 98.4 F (36.9 C)  SpO2: 99% 100%   Last BM Date : 11/11/22 Physical Exam Vitals reviewed.  Constitutional:      General: She is not in acute distress.    Appearance: Normal appearance.  HENT:     Head: Normocephalic and atraumatic.     Nose: Nose normal.  Eyes:     General: No scleral icterus.    Extraocular Movements: Extraocular movements intact.  Cardiovascular:     Rate and Rhythm: Normal rate.     Heart sounds: Normal heart sounds.  Pulmonary:     Effort: Pulmonary effort is normal. No respiratory distress.     Comments: Anterior exam only Abdominal:     General: Bowel sounds are normal. There is no distension.     Palpations: Abdomen is soft.     Tenderness: There is abdominal tenderness. There is no guarding or rebound.     Comments: Right lower quadrant tenderness to palpation  Skin:    General: Skin is warm and dry.     Coloration: Skin is not jaundiced.  Neurological:     Mental Status:  She is alert and oriented to person, place, and time. Mental status is at baseline.  Psychiatric:        Mood and Affect: Mood normal.        Behavior: Behavior normal.        Thought Content: Thought content normal.        Judgment: Judgment normal.      GI:  Lab Results: Recent Labs    11/12/22 1631 11/13/22 0422  WBC 10.3 10.2  HGB 6.4* 8.3*  HCT 21.3* 26.2*  PLT 166 161   BMET Recent Labs    11/12/22 1631 11/13/22 0422  NA 135 135  K 3.8 3.2*  CL 101 102  CO2 20* 20*  GLUCOSE 106* 105*  BUN 108* 99*  CREATININE 3.21* 3.19*  CALCIUM 8.8* 8.6*   LFT Recent Labs    11/13/22 0422  PROT 5.3*  ALBUMIN 2.4*  AST 30  ALT 15  ALKPHOS 26*  BILITOT 1.7*   PT/INR Recent Labs    11/12/22 1631  LABPROT 17.4*  INR 1.4*     Studies/Results: CT ABDOMEN PELVIS WO CONTRAST  Result Date: 11/12/2022 CLINICAL DATA:  Abdominal pain and dark stools for 1 week EXAM: CT ABDOMEN AND PELVIS WITHOUT CONTRAST TECHNIQUE: Multidetector CT imaging of the abdomen and pelvis was performed following the standard protocol without IV contrast. RADIATION DOSE REDUCTION: This exam was performed according to the departmental dose-optimization program which includes automated exposure control, adjustment of the mA and/or kV according to patient size and/or use of iterative reconstruction technique. COMPARISON:  03/31/2015 FINDINGS: Lower chest: Small right-sided pleural effusion is noted. Mild bibasilar atelectasis is seen. Hepatobiliary: No focal liver abnormality is seen. Status post cholecystectomy. No biliary dilatation. Pancreas: Pancreas is somewhat atrophic.  No focal mass is seen. Spleen: Normal in size without focal abnormality. Adrenals/Urinary Tract: Stable left adrenal adenoma is noted. The right kidney is somewhat ptotic in nature. Cystic changes are seen. No further follow-up is recommended. No obstructive changes are seen. The bladder is well distended without focal abnormality.  Left lateral bladder diverticulum is noted Stomach/Bowel: Scattered diverticular   change of the colon is noted without evidence of diverticulitis. Stomach is decompressed. Small bowel appears within normal limits. Vascular/Lymphatic: Aortic atherosclerosis. No enlarged abdominal or pelvic lymph nodes. Reproductive: Status post hysterectomy. No adnexal masses. Other: Laxity of the anterior abdominal wall is noted although no true herniation is seen. This lies in an area of prior umbilical hernia repair. Additionally there is a peritoneal wall defect laterally on the right containing omental fat. No herniated bowel is noted within. This appears overall stable when compared with the prior exam. Musculoskeletal: Postsurgical and degenerative changes of lumbar spine are noted. IMPRESSION: No findings to correspond with the given clinical history of dark tarry stools. Stable right lateral peritoneal wall defect unchanged from prior exam. The previously seen umbilical hernia has been. Although significant laxity of the anterior abdominal wall is noted without definitive herniation. Diverticulosis without diverticulitis. Electronically Signed   By: Mark  Lukens M.D.   On: 11/12/2022 19:37   DG Chest Port 1 View  Result Date: 11/12/2022 CLINICAL DATA:  Questionable sepsis. Evaluate for abnormality. Dark stools. Abnormal pain. EXAM: PORTABLE CHEST 1 VIEW COMPARISON:  One-view chest x-ray 3/17/4 FINDINGS: Cardiomegaly is stable. Edema or effusion is present. No focal airspace disease is present. Visualized soft tissues bony thorax are unremarkable. IMPRESSION: 1. Cardiomegaly without failure. 2. Edema or effusion. Electronically Signed   By: Christopher  Mattern M.D.   On: 11/12/2022 16:25    Impression/Plan: -Melena in setting of use of anticoagulation.  EGD in February 2024 showed gastritis with shallow ulcerations. -Acute blood loss anemia.  Hemoglobin was down to 6.4. -Right lower quadrant abdominal pain.  CT  without contrast showed stable right retroperitoneal wall defect -chronic A-fib -Eliquis on hold since admission. -Chronic lymphedema  Recommendations -------------------------- -Okay to have full liquid diet today. -Continue to hold Eliquis. -Tentative plan for EGD tomorrow.  Patient is not sure about colonoscopy at this time. -Repeat CBC in the morning.  Risks (bleeding, infection, bowel perforation that could require surgery, sedation-related changes in cardiopulmonary systems), benefits (identification and possible treatment of source of symptoms, exclusion of certain causes of symptoms), and alternatives (watchful waiting, radiographic imaging studies, empiric medical treatment)  were explained to patient/family in detail and patient wishes to proceed.     LOS: 1 day   Andromeda Poppen  MD, FACP 11/13/2022, 8:29 AM  Contact #  [phone]  

## 2022-11-13 NOTE — Plan of Care (Signed)
  Problem: Education: Goal: Ability to describe self-care measures that may prevent or decrease complications (Diabetes Survival Skills Education) will improve Outcome: Progressing Goal: Individualized Educational Video(s) Outcome: Progressing   

## 2022-11-13 NOTE — Progress Notes (Signed)
   11/13/22 0054  Vitals  Temp 98.1 F (36.7 C)  Temp Source Oral  Pulse Rate 72  ECG Heart Rate 73  Resp 18  BP (!) 110/42  Oxygen Therapy  SpO2 98 %  O2 Device Room Air   Dr Arville Care notified of 7 beat run of vtach. Verbal order to get stat Magnesium and potassium lab after blood transfusion completes.

## 2022-11-13 NOTE — Consult Note (Signed)
Referring Provider: ED Primary Care Physician:  Irven Coe, MD Primary Gastroenterologist: [facility]  Reason for Consultation: GI bleed  HPI: Tabitha Black is a 89 y.o. female with past medical history of chronic A-fib currently on Eliquis, lower extremity lymphedema, history of chronic kidney disease, chronic CHF, morbid obesity presented to the hospital with dark stools and weakness.  She was admitted to the hospital in February 2024 with melena.  EGD at that time showed gastritis with shallow ulcerations.  Biopsies were negative for H. pylori.  Hemoglobin was 10.1 earlier this month.  Hemoglobin on admission yesterday was 6.4.  Normal LFTs.  Mild elevated INR at 1.4.  CT abdomen pelvis without contrast showed no acute changes.  Patient seen and examined at bedside.  According to her, she has been having intermittent black stool since last 1 week.  Denies any vomiting.  Complaining of nausea and weakness.  Complaining of right lower quadrant discomfort also for last 1 week.  Past Medical History:  Diagnosis Date   ABLA (acute blood loss anemia) 08/23/2022   CKD (chronic kidney disease)    Diabetes mellitus without complication (HCC)    type II   Gastrointestinal hemorrhage with melena 08/23/2022   GERD (gastroesophageal reflux disease)    High cholesterol    Hypertension    Osteoarthritis    Skin cancer, basal cell     Past Surgical History:  Procedure Laterality Date   ABDOMINAL HYSTERECTOMY N/A    APPENDECTOMY N/A 1953   BACK SURGERY     BIOPSY  08/25/2022   Procedure: BIOPSY;  Surgeon: Charlott Rakes, MD;  Location: [facility] ENDOSCOPY;  Service: Gastroenterology;;   CHOLECYSTECTOMY     COLONOSCOPY N/A 02/22/2008   ESOPHAGOGASTRODUODENOSCOPY (EGD) WITH PROPOFOL N/A 08/25/2022   Procedure: ESOPHAGOGASTRODUODENOSCOPY (EGD) WITH PROPOFOL;  Surgeon: Charlott Rakes, MD;  Location: [facility] ENDOSCOPY;  Service: Gastroenterology;  Laterality: N/A;   TOTAL KNEE ARTHROPLASTY Right    UMBILICAL  HERNIA REPAIR N/A 04/02/2015   Procedure: LAPAROSCOPIC UMBILICAL HERNIA REPAIR WITH VENTRALIGHT MESH;  Surgeon: Axel Filler, MD;  Location: [facility] OR;  Service: General;  Laterality: N/A;    Prior to Admission medications   Medication Sig Start Date End Date Taking? Authorizing Provider  acetaminophen (TYLENOL) 325 MG tablet Take 2 tablets (650 mg total) by mouth every 6 (six) hours as needed for mild pain (or Fever >/= 101). 08/27/22   Ghimire, Werner Lean, MD  apixaban (ELIQUIS) 2.5 MG TABS tablet Take 1 tablet (2.5 mg total) by mouth 2 (two) times daily. 06/08/22   Wendall Stade, MD  benzonatate (TESSALON) 200 MG capsule Take 1 capsule (200 mg) by mouth 3 times daily as needed for cough. 10/06/22   Danford, Earl Lites, MD  Cholecalciferol (VITAMIN D3) 125 MCG (5000 UT) TABS Take 5,000 Units by mouth daily.    [provider]  cyclobenzaprine (FLEXERIL) 10 MG tablet Take 5 mg by mouth daily as needed for muscle spasms.    [provider]  doxazosin (CARDURA) 4 MG tablet Take 4 mg by mouth daily.    [provider]  gemfibrozil (LOPID) 600 MG tablet Take 600 mg by mouth daily. 05/08/20   [provider]  guaiFENesin 200 MG tablet Take 1 tablet (200 mg total) by mouth every 4 (four) hours as needed for cough or to loosen phlegm. 10/06/22   Danford, Earl Lites, MD  Iron-FA-B Cmp-C-Biot-Probiotic (FUSION PLUS) CAPS Take 1 capsule by mouth daily. 04/19/20   [provider]  isosorbide  mononitrate (IMDUR) 30 MG 24 hr tablet Take 1/2 tablet (15 mg) by mouth at bedtime. 10/06/22   Danford, Earl Lites, MD  metoprolol succinate (TOPROL-XL) 100 MG 24 hr tablet Take 100 mg by mouth every evening. Take with or immediately following a meal.    [provider]  Multiple Vitamins-Minerals (HAIR SKIN AND NAILS FORMULA) TABS Take 1 tablet by mouth daily.    [provider]  potassium chloride SA (KLOR-CON M) 20 MEQ tablet Take 1 tablet (20 mEq) by  mouth daily. 10/06/22   Danford, Earl Lites, MD  torsemide (DEMADEX) 20 MG tablet Take 20 mg by mouth 2 (two) times daily. 09/11/22   [provider]    Scheduled Meds:  cephALEXin  500 mg Oral Q8H   insulin aspart  0-5 Units Subcutaneous QHS   insulin aspart  0-9 Units Subcutaneous TID WC   metoprolol succinate  50 mg Oral QPM   [START ON 11/16/2022] pantoprazole  40 mg Intravenous Q12H   Continuous Infusions:  pantoprazole 8 mg/hr (11/13/22 0609)   PRN Meds:.acetaminophen **OR** acetaminophen, ondansetron **OR** ondansetron (ZOFRAN) IV  Allergies as of 11/12/2022 - Review Complete 11/12/2022  Allergen Reaction Noted   Other Swelling 03/31/2015   Penicillins Itching, Other (See Comments), Rash, Shortness Of Breath, and Swelling 03/31/2015   Lovastatin Other (See Comments) 03/31/2015   Azithromycin  03/20/2021   Clindamycin  03/20/2021   Hydralazine  05/25/2021   Lisinopril Other (See Comments) 11/29/2016   Neomycin sulfate [neomycin] Other (See Comments) 11/29/2016   Niacin Hives 11/29/2016   Rofecoxib Other (See Comments) 05/16/2020   Tetracycline  03/20/2021   Valsartan Other (See Comments) 11/29/2016   Vytorin [ezetimibe-simvastatin] Other (See Comments) 11/29/2016   Zetia [ezetimibe] Other (See Comments) 11/29/2016    Family History  Problem Relation Age of Onset   Heart failure Mother    Hypertension Mother    Heart attack Father    Prostate cancer Father    Breast cancer Sister     Social History   Socioeconomic History   Marital status: Married    Spouse name: Not on file   Number of children: Not on file   Years of education: Not on file   Highest education level: Not on file  Occupational History   Not on file  Tobacco Use   Smoking status: Never   Smokeless tobacco: Never  Substance and Sexual Activity   Alcohol use: Yes    Comment: twice a year   Drug use: No   Sexual activity: Not on file  Other Topics Concern   Not on file  Social  History Narrative   Not on file   Social Determinants of Health   Financial Resource Strain: Not on file  Food Insecurity: No Food Insecurity (11/12/2022)   Hunger Vital Sign    Worried About Running Out of Food in the Last Year: Never true    Ran Out of Food in the Last Year: Never true  Transportation Needs: No Transportation Needs (11/12/2022)   PRAPARE - Administrator, Civil Service (Medical): No    Lack of Transportation (Non-Medical): No  Physical Activity: Not on file  Stress: Not on file  Social Connections: Not on file  Intimate Partner Violence: Not At Risk (11/12/2022)   Humiliation, Afraid, Rape, and Kick questionnaire    Fear of Current or Ex-Partner: No    Emotionally Abused: No    Physically Abused: No    Sexually Abused: No  Review of Systems: All negative except as stated above in HPI.  Physical Exam: Vital signs: Vitals:   11/13/22 0215 11/13/22 0524  BP: (!) 127/59 (!) 149/58  Pulse: 82 88  Resp: (!) 21 19  Temp: 98.2 F (36.8 C) 98.4 F (36.9 C)  SpO2: 99% 100%   Last BM Date : 11/11/22 Physical Exam Vitals reviewed.  Constitutional:      General: She is not in acute distress.    Appearance: Normal appearance.  HENT:     Head: Normocephalic and atraumatic.     Nose: Nose normal.  Eyes:     General: No scleral icterus.    Extraocular Movements: Extraocular movements intact.  Cardiovascular:     Rate and Rhythm: Normal rate.     Heart sounds: Normal heart sounds.  Pulmonary:     Effort: Pulmonary effort is normal. No respiratory distress.     Comments: Anterior exam only Abdominal:     General: Bowel sounds are normal. There is no distension.     Palpations: Abdomen is soft.     Tenderness: There is abdominal tenderness. There is no guarding or rebound.     Comments: Right lower quadrant tenderness to palpation  Skin:    General: Skin is warm and dry.     Coloration: Skin is not jaundiced.  Neurological:     Mental Status:  She is alert and oriented to person, place, and time. Mental status is at baseline.  Psychiatric:        Mood and Affect: Mood normal.        Behavior: Behavior normal.        Thought Content: Thought content normal.        Judgment: Judgment normal.      GI:  Lab Results: Recent Labs    11/12/22 1631 11/13/22 0422  WBC 10.3 10.2  HGB 6.4* 8.3*  HCT 21.3* 26.2*  PLT 166 161   BMET Recent Labs    11/12/22 1631 11/13/22 0422  NA 135 135  K 3.8 3.2*  CL 101 102  CO2 20* 20*  GLUCOSE 106* 105*  BUN 108* 99*  CREATININE 3.21* 3.19*  CALCIUM 8.8* 8.6*   LFT Recent Labs    11/13/22 0422  PROT 5.3*  ALBUMIN 2.4*  AST 30  ALT 15  ALKPHOS 26*  BILITOT 1.7*   PT/INR Recent Labs    11/12/22 1631  LABPROT 17.4*  INR 1.4*     Studies/Results: CT ABDOMEN PELVIS WO CONTRAST  Result Date: 11/12/2022 CLINICAL DATA:  Abdominal pain and dark stools for 1 week EXAM: CT ABDOMEN AND PELVIS WITHOUT CONTRAST TECHNIQUE: Multidetector CT imaging of the abdomen and pelvis was performed following the standard protocol without IV contrast. RADIATION DOSE REDUCTION: This exam was performed according to the departmental dose-optimization program which includes automated exposure control, adjustment of the mA and/or kV according to patient size and/or use of iterative reconstruction technique. COMPARISON:  03/31/2015 FINDINGS: Lower chest: Small right-sided pleural effusion is noted. Mild bibasilar atelectasis is seen. Hepatobiliary: No focal liver abnormality is seen. Status post cholecystectomy. No biliary dilatation. Pancreas: Pancreas is somewhat atrophic.  No focal mass is seen. Spleen: Normal in size without focal abnormality. Adrenals/Urinary Tract: Stable left adrenal adenoma is noted. The right kidney is somewhat ptotic in nature. Cystic changes are seen. No further follow-up is recommended. No obstructive changes are seen. The bladder is well distended without focal abnormality.  Left lateral bladder diverticulum is noted Stomach/Bowel: Scattered diverticular  change of the colon is noted without evidence of diverticulitis. Stomach is decompressed. Small bowel appears within normal limits. Vascular/Lymphatic: Aortic atherosclerosis. No enlarged abdominal or pelvic lymph nodes. Reproductive: Status post hysterectomy. No adnexal masses. Other: Laxity of the anterior abdominal wall is noted although no true herniation is seen. This lies in an area of prior umbilical hernia repair. Additionally there is a peritoneal wall defect laterally on the right containing omental fat. No herniated bowel is noted within. This appears overall stable when compared with the prior exam. Musculoskeletal: Postsurgical and degenerative changes of lumbar spine are noted. IMPRESSION: No findings to correspond with the given clinical history of dark tarry stools. Stable right lateral peritoneal wall defect unchanged from prior exam. The previously seen umbilical hernia has been. Although significant laxity of the anterior abdominal wall is noted without definitive herniation. Diverticulosis without diverticulitis. Electronically Signed   By: Alcide Clever M.D.   On: 11/12/2022 19:37   DG Chest Port 1 View  Result Date: 11/12/2022 CLINICAL DATA:  Questionable sepsis. Evaluate for abnormality. Dark stools. Abnormal pain. EXAM: PORTABLE CHEST 1 VIEW COMPARISON:  One-view chest x-ray 3/17/4 FINDINGS: Cardiomegaly is stable. Edema or effusion is present. No focal airspace disease is present. Visualized soft tissues bony thorax are unremarkable. IMPRESSION: 1. Cardiomegaly without failure. 2. Edema or effusion. Electronically Signed   By: Marin Roberts M.D.   On: 11/12/2022 16:25    Impression/Plan: -Melena in setting of use of anticoagulation.  EGD in February 2024 showed gastritis with shallow ulcerations. -Acute blood loss anemia.  Hemoglobin was down to 6.4. -Right lower quadrant abdominal pain.  CT  without contrast showed stable right retroperitoneal wall defect -chronic A-fib -Eliquis on hold since admission. -Chronic lymphedema  Recommendations -------------------------- -Okay to have full liquid diet today. -Continue to hold Eliquis. -Tentative plan for EGD tomorrow.  Patient is not sure about colonoscopy at this time. -Repeat CBC in the morning.  Risks (bleeding, infection, bowel perforation that could require surgery, sedation-related changes in cardiopulmonary systems), benefits (identification and possible treatment of source of symptoms, exclusion of certain causes of symptoms), and alternatives (watchful waiting, radiographic imaging studies, empiric medical treatment)  were explained to patient/family in detail and patient wishes to proceed.     LOS: 1 day   Kathi Der  MD, FACP 11/13/2022, 8:29 AM  Contact #  [phone]

## 2022-11-14 ENCOUNTER — Encounter (HOSPITAL_COMMUNITY)
Admission: EM | Disposition: A | Discharge status: Home / Self Care | Payer: Self-pay | Source: Home / Self Care | Attending: Family Medicine

## 2022-11-14 ENCOUNTER — Encounter (HOSPITAL_COMMUNITY): Payer: Self-pay | Admitting: Internal Medicine

## 2022-11-14 ENCOUNTER — Inpatient Hospital Stay (HOSPITAL_COMMUNITY): Payer: Medicare Other | Admitting: Anesthesiology

## 2022-11-14 DIAGNOSIS — I110 Hypertensive heart disease with heart failure: Secondary | ICD-10-CM

## 2022-11-14 DIAGNOSIS — K2991 Gastroduodenitis, unspecified, with bleeding: Secondary | ICD-10-CM

## 2022-11-14 DIAGNOSIS — I4891 Unspecified atrial fibrillation: Secondary | ICD-10-CM

## 2022-11-14 DIAGNOSIS — K254 Chronic or unspecified gastric ulcer with hemorrhage: Secondary | ICD-10-CM

## 2022-11-14 DIAGNOSIS — K922 Gastrointestinal hemorrhage, unspecified: Secondary | ICD-10-CM | POA: Diagnosis not present

## 2022-11-14 DIAGNOSIS — I509 Heart failure, unspecified: Secondary | ICD-10-CM

## 2022-11-14 HISTORY — PX: ESOPHAGOGASTRODUODENOSCOPY (EGD) WITH PROPOFOL: SHX5813

## 2022-11-14 LAB — TYPE AND SCREEN

## 2022-11-14 LAB — BASIC METABOLIC PANEL
Anion gap: 12 (ref 5–15)
BUN: 91 mg/dL — ABNORMAL HIGH (ref 8–23)
CO2: 20 mmol/L — ABNORMAL LOW (ref 22–32)
Calcium: 8.9 mg/dL (ref 8.9–10.3)
Chloride: 104 mmol/L (ref 98–111)
Creatinine, Ser: 3.26 mg/dL — ABNORMAL HIGH (ref 0.44–1.00)
GFR, Estimated: 13 mL/min — ABNORMAL LOW (ref >60)
Glucose, Bld: 112 mg/dL — ABNORMAL HIGH (ref 70–99)
Potassium: 3.6 mmol/L (ref 3.5–5.1)
Sodium: 136 mmol/L (ref 135–145)

## 2022-11-14 LAB — MAGNESIUM: Magnesium: 2.3 mg/dL (ref 1.7–2.4)

## 2022-11-14 LAB — CBC
HCT: 27 % — ABNORMAL LOW (ref 36.0–46.0)
Hemoglobin: 8.7 g/dL — ABNORMAL LOW (ref 12.0–15.0)
MCH: 30.3 pg (ref 26.0–34.0)
MCHC: 32.2 g/dL (ref 30.0–36.0)
MCV: 94.1 fL (ref 80.0–100.0)
Platelets: 173 10*3/uL (ref 150–400)
RBC: 2.87 MIL/uL — ABNORMAL LOW (ref 3.87–5.11)
RDW: 17.6 % — ABNORMAL HIGH (ref 11.5–15.5)
WBC: 9.5 10*3/uL (ref 4.0–10.5)
nRBC: 0 % (ref 0.0–0.2)

## 2022-11-14 LAB — GLUCOSE, CAPILLARY
Glucose-Capillary: 109 mg/dL — ABNORMAL HIGH (ref 70–99)
Glucose-Capillary: 102 mg/dL — ABNORMAL HIGH (ref 70–99)
Glucose-Capillary: 101 mg/dL — ABNORMAL HIGH (ref 70–99)
Glucose-Capillary: 102 mg/dL — ABNORMAL HIGH (ref 70–99)

## 2022-11-14 LAB — BPAM RBC
Blood Product Expiration Date: 202405222359
Blood Product Expiration Date: 202405222359
ISSUE DATE / TIME: 202404262118

## 2022-11-14 LAB — CULTURE, BLOOD (ROUTINE X 2): Culture: NO GROWTH

## 2022-11-14 LAB — PHOSPHORUS: Phosphorus: 4 mg/dL (ref 2.5–4.6)

## 2022-11-14 SURGERY — ESOPHAGOGASTRODUODENOSCOPY (EGD) WITH PROPOFOL
Anesthesia: Monitor Anesthesia Care

## 2022-11-14 MED ORDER — CEPHALEXIN 250 MG PO CAPS
500.0000 mg | ORAL_CAPSULE | ORAL | Status: DC
  Administered 2022-11-15 – 2022-11-16 (×2): 500 mg via ORAL
  Filled 2022-11-14 (×2): qty 2

## 2022-11-14 MED ORDER — SODIUM CHLORIDE 0.9 % IV SOLN: INTRAVENOUS | Status: DC

## 2022-11-14 MED ORDER — PEG 3350-KCL-NA BICARB-NACL 420 G PO SOLR
4000.0000 mL | Freq: Once | ORAL | Status: AC
  Administered 2022-11-14: 4000 mL via ORAL
  Filled 2022-11-14: qty 4000

## 2022-11-14 MED ORDER — PANTOPRAZOLE SODIUM 40 MG IV SOLR
40.0000 mg | Freq: Two times a day (BID) | INTRAVENOUS | Status: DC
  Administered 2022-11-14 – 2022-11-16 (×5): 40 mg via INTRAVENOUS
  Filled 2022-11-14 (×5): qty 10

## 2022-11-14 MED ORDER — LIDOCAINE 2% (20 MG/ML) 5 ML SYRINGE
INTRAMUSCULAR | Status: DC | PRN
  Administered 2022-11-14: 40 mg via INTRAVENOUS

## 2022-11-14 MED ORDER — PROPOFOL 500 MG/50ML IV EMUL
INTRAVENOUS | Status: DC | PRN
  Administered 2022-11-14: 180 ug/kg/min via INTRAVENOUS

## 2022-11-14 MED ORDER — PROPOFOL 10 MG/ML IV BOLUS
INTRAVENOUS | Status: DC | PRN
  Administered 2022-11-14 (×2): 20 mg via INTRAVENOUS

## 2022-11-14 SURGICAL SUPPLY — 15 items

## 2022-11-14 NOTE — Anesthesia Postprocedure Evaluation (Signed)
Anesthesia Post Note  Patient: Tabitha Black  Procedure(s) Performed: ESOPHAGOGASTRODUODENOSCOPY (EGD) WITH PROPOFOL     Patient location during evaluation: Endoscopy Anesthesia Type: MAC Level of consciousness: awake and alert Pain management: pain level controlled Vital Signs Assessment: post-procedure vital signs reviewed and stable Respiratory status: spontaneous breathing, nonlabored ventilation, respiratory function stable and patient connected to nasal cannula oxygen Cardiovascular status: stable and blood pressure returned to baseline Postop Assessment: no apparent nausea or vomiting Anesthetic complications: no   No notable events documented.  Last Vitals:  Vitals:   11/14/22 1619 11/14/22 1944  BP: (!) 153/54 (!) 165/80  Pulse: 85 93  Resp: 20 18  Temp: 36.9 C 36.8 C  SpO2: 93% 94%    Last Pain:  Vitals:   11/14/22 1944  TempSrc: Oral  PainSc:                  Collene Schlichter

## 2022-11-14 NOTE — Progress Notes (Signed)
PHARMACY NOTE:  ANTIMICROBIAL RENAL DOSAGE ADJUSTMENT  Current antimicrobial regimen includes a mismatch between antimicrobial dosage and estimated renal function.  As per policy approved by the Pharmacy & Therapeutics and Medical Executive Committees, the antimicrobial dosage will be adjusted accordingly.  Current antimicrobial dosage:  cephalexin 500mg  q8h  Indication: cellulitis  Renal Function:  Estimated Creatinine Clearance: 11.7 mL/min (A) (by C-G formula based on SCr of 3.26 mg/dL (H)). []      On intermittent HD, scheduled: []      On CRRT    Antimicrobial dosage has been changed to:  q24h  Additional comments:   Thank you for allowing pharmacy to be a part of this patient's care.  Mosetta Anis, [facility] 11/14/2022 10:32 AM

## 2022-11-14 NOTE — Op Note (Signed)
[facility] Patient Name: Tabitha Black Procedure Date : 11/14/2022 MRN: [id number] Attending MD: Kathi Der , MD, [id number] Date of Birth: 1933-01-24 CSN: [id number] Age: 89 Admit Type: Inpatient Procedure:                Upper GI endoscopy Indications:              Melena Providers:                Kathi Der, MD, Margaree Mackintosh, RN,                            Marja Kays, Technician Referring MD:              Medicines:                Sedation Administered by an Anesthesia Professional Complications:            No immediate complications. Estimated Blood Loss:     Estimated blood loss was minimal. Procedure:                Pre-Anesthesia Assessment:                           - Prior to the procedure, a History and Physical                            was performed, and patient medications and                            allergies were reviewed. The patient's tolerance of                            previous anesthesia was also reviewed. The risks                            and benefits of the procedure and the sedation                            options and risks were discussed with the patient.                            All questions were answered, and informed consent                            was obtained. Prior Anticoagulants: The patient has                            taken Eliquis (apixaban), last dose was 2 days                            prior to procedure. ASA Grade Assessment: III - A                            patient with severe systemic disease. After  reviewing the risks and benefits, the patient was                            deemed in satisfactory condition to undergo the                            procedure.                           After obtaining informed consent, the endoscope was                            passed under direct vision. Throughout the                            procedure, the patient's  blood pressure, pulse, and                            oxygen saturations were monitored continuously. The                            [id number]-[id number] ([id number]) Olympus endoscope was introduced                            through the mouth, and advanced to the second part                            of duodenum. The upper GI endoscopy was                            accomplished without difficulty. The patient                            tolerated the procedure well. Scope In: Scope Out: Findings:      The Z-line was regular and was found 36 cm from the incisors.      One non-bleeding superficial gastric ulcer with no stigmata of bleeding       was found in the prepyloric region of the stomach. The lesion was 2 mm       in largest dimension.      Scattered mild inflammation characterized by congestion (edema) and       erythema was found in the gastric body.      The cardia and gastric fundus were normal on retroflexion.      Scattered mild inflammation characterized by congestion (edema),       erosions and erythema was found in the duodenal bulb.      The first portion of the duodenum and second portion of the duodenum       were normal. Impression:               - Z-line regular, 36 cm from the incisors.                           - Non-bleeding gastric ulcer with no stigmata of  bleeding.                           - Gastritis.                           - Duodenitis.                           - Normal first portion of the duodenum and second                            portion of the duodenum.                           - No specimens collected. Recommendation:           - Perform a colonoscopy tomorrow. Procedure Code(s):        --- Professional ---                           [phone], Esophagogastroduodenoscopy, flexible,                            transoral; diagnostic, including collection of                            specimen(s) by brushing or washing, when performed                             (separate procedure) Diagnosis Code(s):        --- Professional ---                           K25.9, Gastric ulcer, unspecified as acute or                            chronic, without hemorrhage or perforation                           K29.70, Gastritis, unspecified, without bleeding                           K29.80, Duodenitis without bleeding                           K92.1, Melena (includes Hematochezia) CPT copyright 2022 [employer] Association. All rights reserved. The codes documented in this report are preliminary and upon coder review may  be revised to meet current compliance requirements. Kathi Der, MD Kathi Der, MD 11/14/2022 12:03:17 PM Number of Addenda: 0

## 2022-11-14 NOTE — Brief Op Note (Signed)
11/14/2022  11:54 AM  PATIENT:  Tabitha Black  89 y.o. female  PRE-OPERATIVE DIAGNOSIS:  Melena, GI bleed  POST-OPERATIVE DIAGNOSIS:  no bleeding noted, gastritis  PROCEDURE:  Procedure(s): ESOPHAGOGASTRODUODENOSCOPY (EGD) WITH PROPOFOL (N/A)  SURGEON:  Surgeon(s) and Role:    * Kiaraliz Rafuse, MD - Primary  Findings ------------ -EGD showed tiny prepyloric gastric ulcer and gastritis as well as duodenitis.  No evidence of active bleeding.  Recommendations ----------------------- -Given patient's recurrent anemia, recommend colonoscopy for further evaluation tomorrow. -Okay to have clear liquids diet today.  Keep n.p.o. past midnight.  Risks (bleeding, infection, bowel perforation that could require surgery, sedation-related changes in cardiopulmonary systems), benefits (identification and possible treatment of source of symptoms, exclusion of certain causes of symptoms), and alternatives (watchful waiting, radiographic imaging studies, empiric medical treatment)  were explained to patient/family in detail and patient wishes to proceed.  Kathi Der MD, FACP 11/14/2022, 11:55 AM  Contact #  [phone]

## 2022-11-14 NOTE — Progress Notes (Signed)
PROGRESS NOTE    ROSHELL BRIGHAM  ONG:[id number] DOB: Feb 22, 1933 DOA: 11/12/2022  PCP: Irven Coe, MD   Brief Narrative:  This 89 year old female with PMH significant for morbid obesity, bilateral lower extremity lymphedema, Chronic diastolic heart failure, CKD stage IV with baseline creatinine 2.9-3.1, chronic A-fib on Eliquis, hypertension, presented in the ED  with GI bleeding for the last 5 days. Patient had a GI bleed in February 2024. She underwent EGD which demonstrated gastric ulcerations. Pathology was negative for H. pylori. She was restarted on her outpatient Eliquis. Patient states that she has been on her Eliquis this whole week. This is despite having GI bleeding that started on Monday. Patient reports feeling fatigued and weak.  She is found to have hemoglobin 6.4.  S/p 2 unit PRBC.  CT abdomen and pelvis demonstrated no acute bleeding.  Patient is admitted for further evaluation.  GI is consulted and scheduled for EGD today. Eliquis is on hold.  Assessment & Plan:   Principal Problem:   GI bleeding Active Problems:   Cellulitis of right leg   Chronic diastolic CHF (congestive heart failure) (HCC)   Chronic kidney disease, stage 4 (severe) (HCC) -Baseline Scr 2.9-3.1   Gastroesophageal reflux disease   Morbid obesity (HCC)   HTN (hypertension)   A-fib (HCC)   DM2 (diabetes mellitus, type 2) (HCC)   Lymphedema of both lower extremities - R > L  GI bleeding: Patient reports having black-colored stools in the setting of Eliquis use. She reports having bleeding for last 1 week, She continued to take Eliquis. Hemoglobin on arrival 6.4, transfused 2 unit PRBC. Hemoglobin s/p transfusion improved to 8.2.  [facility] is consulted.   Patient is scheduled for EGD today.  Continue Eliquis on hold.  Cellulitis of right leg Continue Keflex 500 every 8 hours.  Lymphedema of both lower extremities:  Chronic , stable.  Diabetes mellitus with hyperglycemia: Continue sliding  scale. Carb modified diet. Obtain Hb A1c  Atrial fibrillation: Heart rate is well-controlled. Continue Toprol-XL Hold Eliquis due to GI bleeding.  Essential hypertension: Continue Toprol-XL.  Chronic kidney disease stage IV: Serum creatinine at baseline. Avoid nephrotoxic medications.  Chronic diastolic CHF: Does not appear euvolemic.   Continue diuresis in between and after transfusion.   DVT prophylaxis:SCDs Code Status:Full code. Family Communication: No family at bed side. Disposition Plan:   Status is: Inpatient Remains inpatient appropriate because: Admitted for GI bleeding in the setting of Eliquis use.  GI is consulted and scheduled for EGD today.    Consultants:  Gastroenterology  Procedures: Scheduled EGD today. Antimicrobials: Keflex p.o.  Subjective: Patient was seen and examined at bedside.  Overnight events noted.   Patient appears very anxious and states she is scared to have a procedure. I explained to the patient and calmed her down.  She feels better.  Objective: Vitals:   11/14/22 0300 11/14/22 0321 11/14/22 0641 11/14/22 0753  BP:  (!) 147/66  (!) 139/51  Pulse: 82 94  84  Resp: 16 19  19   Temp:  98.1 F (36.7 C)  98.1 F (36.7 C)  TempSrc:  Oral  Oral  SpO2: 95% 95%  95%  Weight:   73.5 kg   Height:        Intake/Output Summary (Last 24 hours) at 11/14/2022 1015 Last data filed at 11/14/2022 0755 Gross per 24 hour  Intake 490.79 ml  Output 1400 ml  Net -909.21 ml   Filed Weights   11/13/22 0500 11/14/22 4401  Weight: 75.6 kg 73.5 kg    Examination:  General exam: Appears comfortable, deconditioned, not in any acute distress. Respiratory system: CTA bilaterally, respiratory effort normal, RR 13. Cardiovascular system: S1 & S2 heard, Irregular rhythm, no murmur. Gastrointestinal system: Abdomen is soft, non tender, non distended, BS+ Central nervous system: Alert and oriented x 3. No focal neurological deficits. Extremities:  Chronic lymphedematous changes with slight erythema. Skin: No rashes, lesions or ulcers Psychiatry: Judgement and insight appear normal. Mood & affect appropriate.     Data Reviewed: I have personally reviewed following labs and imaging studies  CBC: Recent Labs  Lab 11/09/22 1244 11/12/22 1631 11/13/22 0422 11/14/22 0050  WBC  --  10.3 10.2 9.5  NEUTROABS  --  9.2* 8.9*  --   HGB 8.2* 6.4* 8.3* 8.7*  HCT  --  21.3* 26.2* 27.0*  MCV  --  100.5* 93.6 94.1  PLT  --  166 161 173   Basic Metabolic Panel: Recent Labs  Lab 11/09/22 1300 11/12/22 1631 11/13/22 0422 11/14/22 0050  NA 134* 135 135 136  K 4.0 3.8 3.2* 3.6  CL 99 101 102 104  CO2 23 20* 20* 20*  GLUCOSE 117* 106* 105* 112*  BUN 97* 108* 99* 91*  CREATININE 3.09* 3.21* 3.19* 3.26*  CALCIUM 9.3 8.8* 8.6* 8.9  MG 2.0  --  1.7 2.3  PHOS 4.5  --   --  4.0   GFR: Estimated Creatinine Clearance: 11.7 mL/min (A) (by C-G formula based on SCr of 3.26 mg/dL (H)). Liver Function Tests: Recent Labs  Lab 11/09/22 1300 11/12/22 1631 11/13/22 0422  AST  --  38 30  ALT  --  14 15  ALKPHOS  --  26* 26*  BILITOT  --  1.3* 1.7*  PROT  --  5.8* 5.3*  ALBUMIN 3.2* 2.7* 2.4*   No results for input(s): "LIPASE", "AMYLASE" in the last 168 hours. No results for input(s): "AMMONIA" in the last 168 hours. Coagulation Profile: Recent Labs  Lab 11/12/22 1631  INR 1.4*   Cardiac Enzymes: No results for input(s): "CKTOTAL", "CKMB", "CKMBINDEX", "TROPONINI" in the last 168 hours. BNP (last 3 results) No results for input(s): "PROBNP" in the last 8760 hours. HbA1C: No results for input(s): "HGBA1C" in the last 72 hours. CBG: Recent Labs  Lab 11/13/22 0632 11/13/22 1151 11/13/22 1643 11/13/22 2205 11/14/22 0635  GLUCAP 98 131* 112* 104* 109*   Lipid Profile: No results for input(s): "CHOL", "HDL", "LDLCALC", "TRIG", "CHOLHDL", "LDLDIRECT" in the last 72 hours. Thyroid Function Tests: No results for input(s):  "TSH", "T4TOTAL", "FREET4", "T3FREE", "THYROIDAB" in the last 72 hours. Anemia Panel: No results for input(s): "VITAMINB12", "FOLATE", "FERRITIN", "TIBC", "IRON", "RETICCTPCT" in the last 72 hours. Sepsis Labs: Recent Labs  Lab 11/12/22 1602 11/13/22 0422  LATICACIDVEN 1.1 0.7    Recent Results (from the past 240 hour(s))  Blood Culture (routine x 2)     Status: None (Preliminary result)   Collection Time: 11/12/22  4:31 PM   Specimen: BLOOD  Result Value Ref Range Status   Specimen Description BLOOD SITE NOT SPECIFIED  Final   Special Requests   Final    BOTTLES DRAWN AEROBIC AND ANAEROBIC Blood Culture results may not be optimal due to an inadequate volume of blood received in culture bottles   Culture   Final    NO GROWTH 2 DAYS Performed at [facility], [address] [place], [place] [address]    Report Status PENDING  Incomplete  Blood Culture (routine x 2)     Status: None (Preliminary result)   Collection Time: 11/12/22  5:20 PM   Specimen: BLOOD RIGHT ARM  Result Value Ref Range Status   Specimen Description BLOOD RIGHT ARM  Final   Special Requests   Final    BOTTLES DRAWN AEROBIC AND ANAEROBIC Blood Culture adequate volume   Culture   Final    NO GROWTH 2 DAYS Performed at [facility], [address] [place], [place] [address]    Report Status PENDING  Incomplete    Radiology Studies: CT ABDOMEN PELVIS WO CONTRAST  Result Date: 11/12/2022 CLINICAL DATA:  Abdominal pain and dark stools for 1 week EXAM: CT ABDOMEN AND PELVIS WITHOUT CONTRAST TECHNIQUE: Multidetector CT imaging of the abdomen and pelvis was performed following the standard protocol without IV contrast. RADIATION DOSE REDUCTION: This exam was performed according to the departmental dose-optimization program which includes automated exposure control, adjustment of the mA and/or kV according to patient size and/or use of iterative reconstruction technique. COMPARISON:  03/31/2015 FINDINGS:  Lower chest: Small right-sided pleural effusion is noted. Mild bibasilar atelectasis is seen. Hepatobiliary: No focal liver abnormality is seen. Status post cholecystectomy. No biliary dilatation. Pancreas: Pancreas is somewhat atrophic.  No focal mass is seen. Spleen: Normal in size without focal abnormality. Adrenals/Urinary Tract: Stable left adrenal adenoma is noted. The right kidney is somewhat ptotic in nature. Cystic changes are seen. No further follow-up is recommended. No obstructive changes are seen. The bladder is well distended without focal abnormality. Left lateral bladder diverticulum is noted Stomach/Bowel: Scattered diverticular change of the colon is noted without evidence of diverticulitis. Stomach is decompressed. Small bowel appears within normal limits. Vascular/Lymphatic: Aortic atherosclerosis. No enlarged abdominal or pelvic lymph nodes. Reproductive: Status post hysterectomy. No adnexal masses. Other: Laxity of the anterior abdominal wall is noted although no true herniation is seen. This lies in an area of prior umbilical hernia repair. Additionally there is a peritoneal wall defect laterally on the right containing omental fat. No herniated bowel is noted within. This appears overall stable when compared with the prior exam. Musculoskeletal: Postsurgical and degenerative changes of lumbar spine are noted. IMPRESSION: No findings to correspond with the given clinical history of dark tarry stools. Stable right lateral peritoneal wall defect unchanged from prior exam. The previously seen umbilical hernia has been. Although significant laxity of the anterior abdominal wall is noted without definitive herniation. Diverticulosis without diverticulitis. Electronically Signed   By: Alcide Clever M.D.   On: 11/12/2022 19:37   DG Chest Port 1 View  Result Date: 11/12/2022 CLINICAL DATA:  Questionable sepsis. Evaluate for abnormality. Dark stools. Abnormal pain. EXAM: PORTABLE CHEST 1 VIEW  COMPARISON:  One-view chest x-ray 3/17/4 FINDINGS: Cardiomegaly is stable. Edema or effusion is present. No focal airspace disease is present. Visualized soft tissues bony thorax are unremarkable. IMPRESSION: 1. Cardiomegaly without failure. 2. Edema or effusion. Electronically Signed   By: Marin Roberts M.D.   On: 11/12/2022 16:25    Scheduled Meds:  cephALEXin  500 mg Oral Q8H   insulin aspart  0-5 Units Subcutaneous QHS   insulin aspart  0-9 Units Subcutaneous TID WC   metoprolol succinate  50 mg Oral QPM   [START ON 11/16/2022] pantoprazole  40 mg Intravenous Q12H   Continuous Infusions:  sodium chloride 20 mL/hr at 11/14/22 0639   pantoprazole 8 mg/hr (11/14/22 0322)     LOS: 2 days    Time  spent: 35 mins    Willeen Niece, MD [facility]   If 7PM-7AM, please contact night-coverage

## 2022-11-14 NOTE — Transfer of Care (Signed)
Immediate Anesthesia Transfer of Care Note  Patient: Tabitha Black  Procedure(s) Performed: ESOPHAGOGASTRODUODENOSCOPY (EGD) WITH PROPOFOL  Patient Location: PACU  Anesthesia Type:MAC  Level of Consciousness: drowsy and patient cooperative  Airway & Oxygen Therapy: Patient Spontanous Breathing  Post-op Assessment: Report given to RN and Post -op Vital signs reviewed and stable  Post vital signs: Reviewed and stable  Last Vitals:  Vitals Value Taken Time  BP 137/52 11/14/22 1200  Temp 36.7 C 11/14/22 1200  Pulse 85 11/14/22 1203  Resp 22 11/14/22 1203  SpO2 96 % 11/14/22 1203  Vitals shown include unvalidated device data.  Last Pain:  Vitals:   11/14/22 1113  TempSrc: Temporal  PainSc: 0-No pain         Complications: No notable events documented.

## 2022-11-14 NOTE — Interval H&P Note (Signed)
History and Physical Interval Note:  11/14/2022 11:09 AM  Tabitha Black  has presented today for surgery, with the diagnosis of Melena, GI bleed.  The various methods of treatment have been discussed with the patient and family. After consideration of risks, benefits and other options for treatment, the patient has consented to  Procedure(s): ESOPHAGOGASTRODUODENOSCOPY (EGD) WITH PROPOFOL (N/A) as a surgical intervention.  The patient's history has been reviewed, patient examined, no change in status, stable for surgery.  I have reviewed the patient's chart and labs.  Questions were answered to the patient's satisfaction.     Rosamae Rocque

## 2022-11-14 NOTE — Anesthesia Preprocedure Evaluation (Addendum)
Anesthesia Evaluation  Patient identified by MRN, date of birth, ID band Patient awake    Reviewed: Allergy & Precautions, NPO status , Patient's Chart, lab work & pertinent test results, reviewed documented beta blocker date and time   Airway Mallampati: II  TM Distance: >3 FB Neck ROM: Full    Dental  (+) Teeth Intact, Dental Advisory Given   Pulmonary neg pulmonary ROS   Pulmonary exam normal breath sounds clear to auscultation       Cardiovascular hypertension, Pt. on home beta blockers +CHF  + dysrhythmias Atrial Fibrillation  Rhythm:Irregular Rate:Abnormal     Neuro/Psych  Neuromuscular disease    GI/Hepatic Neg liver ROS,GERD  ,,GIB   Endo/Other  diabetes    Renal/GU Renal Insufficiency and CRFRenal disease     Musculoskeletal  (+) Arthritis ,    Abdominal   Peds  Hematology  (+) Blood dyscrasia (Eliquis), anemia   Anesthesia Other Findings Day of surgery medications reviewed with the patient.  Reproductive/Obstetrics                             Anesthesia Physical Anesthesia Plan  ASA: 3  Anesthesia Plan: MAC   Post-op Pain Management:    Induction: Intravenous  PONV Risk Score and Plan: 2 and TIVA and Treatment may vary due to age or medical condition  Airway Management Planned: Natural Airway and Simple Face Mask  Additional Equipment:   Intra-op Plan:   Post-operative Plan:   Informed Consent: I have reviewed the patients History and Physical, chart, labs and discussed the procedure including the risks, benefits and alternatives for the proposed anesthesia with the patient or authorized representative who has indicated his/her understanding and acceptance.     Dental advisory given  Plan Discussed with: CRNA and Anesthesiologist  Anesthesia Plan Comments:        Anesthesia Quick Evaluation

## 2022-11-14 NOTE — Progress Notes (Signed)
Notified by tele that sats 83%; RN went to room and pt sleeping with mouth open, sats 83% RA. Respirations unlabored. Pt woke easily to voice, denies sob. Sats back up to 95% RA when pt woke.  Returned to sleep and sats 95% RA.

## 2022-11-15 ENCOUNTER — Encounter (HOSPITAL_COMMUNITY)
Admission: EM | Disposition: A | Discharge status: Home / Self Care | Payer: Self-pay | Source: Home / Self Care | Attending: Family Medicine

## 2022-11-15 ENCOUNTER — Encounter (HOSPITAL_COMMUNITY): Payer: Self-pay | Admitting: Gastroenterology

## 2022-11-15 DIAGNOSIS — K922 Gastrointestinal hemorrhage, unspecified: Secondary | ICD-10-CM | POA: Diagnosis not present

## 2022-11-15 LAB — GLUCOSE, CAPILLARY
Glucose-Capillary: 99 mg/dL (ref 70–99)
Glucose-Capillary: 91 mg/dL (ref 70–99)
Glucose-Capillary: 135 mg/dL — ABNORMAL HIGH (ref 70–99)
Glucose-Capillary: 138 mg/dL — ABNORMAL HIGH (ref 70–99)

## 2022-11-15 LAB — OCCULT BLOOD, POC DEVICE: Fecal Occult Bld: POSITIVE — AB

## 2022-11-15 SURGERY — COLONOSCOPY WITH PROPOFOL
Anesthesia: Monitor Anesthesia Care

## 2022-11-15 MED ORDER — SODIUM CHLORIDE 0.9 % IV SOLN: INTRAVENOUS | Status: DC

## 2022-11-15 MED ORDER — POLYETHYLENE GLYCOL 3350 17 GM/SCOOP PO POWD
1.0000 | Freq: Once | ORAL | Status: AC
  Administered 2022-11-15: 255 g via ORAL
  Filled 2022-11-15: qty 255

## 2022-11-15 NOTE — Progress Notes (Signed)
PROGRESS NOTE    Tabitha Black  ZOX:[id number] DOB: 05-29-1933 DOA: 11/12/2022  PCP: Irven Coe, MD   Brief Narrative:  This 89 year old female with PMH significant for morbid obesity, bilateral lower extremity lymphedema, Chronic diastolic heart failure, CKD stage IV with baseline creatinine 2.9-3.1, chronic A-fib on Eliquis, hypertension, presented in the ED with GI bleeding for the last 5 days. Patient had a GI bleed in February 2024. She underwent EGD which demonstrated gastric ulcerations. Pathology was negative for H. pylori. She was restarted on her outpatient Eliquis. Patient states that she has been on her Eliquis this whole week. This is despite having GI bleeding that started on Monday. Patient reports feeling fatigued and weak.  She is found to have hemoglobin 6.4.  S/p 2 unit PRBC.  CT abdomen and pelvis demonstrated no acute bleeding.  Patient is admitted.  GI is consulted.  Patient underwent EGD found to have nonbleeding gastric ulcers, gastritis, duodenitis.  Colonoscopy is scheduled.  Assessment & Plan:   Principal Problem:   GI bleeding Active Problems:   Cellulitis of right leg   Chronic diastolic CHF (congestive heart failure) (HCC)   Chronic kidney disease, stage 4 (severe) (HCC) -Baseline Scr 2.9-3.1   Gastroesophageal reflux disease   Morbid obesity (HCC)   HTN (hypertension)   A-fib (HCC)   DM2 (diabetes mellitus, type 2) (HCC)   Lymphedema of both lower extremities - R > L  GI bleeding: Patient reports having black-colored stools in the setting of Eliquis use. She reports having bleeding for last 1 week, She continued to take Eliquis. Hemoglobin on arrival 6.4, transfused 2 unit PRBC. Hemoglobin s/p transfusion improved to 8.2.  [facility] is consulted.   Patient underwent EGD found to have nonbleeding gastric ulcer,  duodenitis gastritis. Patient is scheduled for colonoscopy today but has poor prep.  Canceled for tomorrow.   Continue Eliquis on  hold.  Cellulitis of right leg Continue Keflex 500 every 8 hours.  Lymphedema of both lower extremities:  Chronic , stable.  Diabetes mellitus with hyperglycemia: Continue sliding scale. Carb modified diet.  Atrial fibrillation: Heart rate is well-controlled. Continue Toprol-XL Hold Eliquis due to GI bleeding.  Essential hypertension: Continue Toprol-XL.  AKI on chronic kidney disease stage IV: Serum creatinine slightly trended up to 3.24 Likely in the setting of GI bleed, continue IV fluid resuscitation. Avoid nephrotoxic medications.  Recheck am lab  Chronic diastolic CHF: Does not appear euvolemic.   Continue diuresis in between and after transfusion.   DVT prophylaxis:SCDs Code Status:Full code. Family Communication: Daughter at bedside. Disposition Plan:   Status is: Inpatient Remains inpatient appropriate because: Admitted for GI bleeding in the setting of Eliquis use.   GI is consulted , underwent EGD, scheduled for colonoscopy today because of poor preparation    Consultants:  Gastroenterology  Procedures: EGD and colonoscopy Antimicrobials: Keflex p.o.  Subjective: Patient was seen and examined at bedside.  Overnight events noted.   Patient appears very anxious, underwent EGD found to have gastritis,  duodenitis. She is scheduled to have colonoscopy but due to poor preparation , it is canceled for tomorrow.  Objective: Vitals:   11/14/22 2135 11/14/22 2358 11/15/22 0318 11/15/22 0751  BP:  (!) 140/72 (!) 161/77 (!) 153/61  Pulse:  95 95 89  Resp: 20 19 20 19   Temp:  97.6 F (36.4 C) 98.6 F (37 C) (!) 97.5 F (36.4 C)  TempSrc:  Oral Oral Oral  SpO2:  95% 91% 93%  Weight:  74.6 kg   Height:        Intake/Output Summary (Last 24 hours) at 11/15/2022 1036 Last data filed at 11/14/2022 2135 Gross per 24 hour  Intake 540 ml  Output 350 ml  Net 190 ml   Filed Weights   11/13/22 0500 11/14/22 0641 11/15/22 0318  Weight: 75.6 kg 73.5 kg  74.6 kg    Examination:  General exam: Appears comfortable, deconditioned, not in any acute distress. Respiratory system: CTA bilaterally, respiratory effort normal, RR 14. Cardiovascular system: S1 & S2 heard, Irregular rhythm, no murmur. Gastrointestinal system: Abdomen is soft, non tender, non distended, BS+ Central nervous system: Alert and oriented x 3. No focal neurological deficits. Extremities: Chronic lymphedematous changes with slight erythema. Skin: No rashes, lesions or ulcers Psychiatry: Judgement and insight appear normal. Mood & affect appropriate.     Data Reviewed: I have personally reviewed following labs and imaging studies  CBC: Recent Labs  Lab 11/09/22 1244 11/12/22 1631 11/13/22 0422 11/14/22 0050  WBC  --  10.3 10.2 9.5  NEUTROABS  --  9.2* 8.9*  --   HGB 8.2* 6.4* 8.3* 8.7*  HCT  --  21.3* 26.2* 27.0*  MCV  --  100.5* 93.6 94.1  PLT  --  166 161 173   Basic Metabolic Panel: Recent Labs  Lab 11/09/22 1300 11/12/22 1631 11/13/22 0422 11/14/22 0050  NA 134* 135 135 136  K 4.0 3.8 3.2* 3.6  CL 99 101 102 104  CO2 23 20* 20* 20*  GLUCOSE 117* 106* 105* 112*  BUN 97* 108* 99* 91*  CREATININE 3.09* 3.21* 3.19* 3.26*  CALCIUM 9.3 8.8* 8.6* 8.9  MG 2.0  --  1.7 2.3  PHOS 4.5  --   --  4.0   GFR: Estimated Creatinine Clearance: 11.8 mL/min (A) (by C-G formula based on SCr of 3.26 mg/dL (H)). Liver Function Tests: Recent Labs  Lab 11/09/22 1300 11/12/22 1631 11/13/22 0422  AST  --  38 30  ALT  --  14 15  ALKPHOS  --  26* 26*  BILITOT  --  1.3* 1.7*  PROT  --  5.8* 5.3*  ALBUMIN 3.2* 2.7* 2.4*   No results for input(s): "LIPASE", "AMYLASE" in the last 168 hours. No results for input(s): "AMMONIA" in the last 168 hours. Coagulation Profile: Recent Labs  Lab 11/12/22 1631  INR 1.4*   Cardiac Enzymes: No results for input(s): "CKTOTAL", "CKMB", "CKMBINDEX", "TROPONINI" in the last 168 hours. BNP (last 3 results) No results for  input(s): "PROBNP" in the last 8760 hours. HbA1C: No results for input(s): "HGBA1C" in the last 72 hours. CBG: Recent Labs  Lab 11/14/22 0635 11/14/22 1319 11/14/22 1649 11/14/22 2119 11/15/22 0614  GLUCAP 109* 102* 101* 102* 99   Lipid Profile: No results for input(s): "CHOL", "HDL", "LDLCALC", "TRIG", "CHOLHDL", "LDLDIRECT" in the last 72 hours. Thyroid Function Tests: No results for input(s): "TSH", "T4TOTAL", "FREET4", "T3FREE", "THYROIDAB" in the last 72 hours. Anemia Panel: No results for input(s): "VITAMINB12", "FOLATE", "FERRITIN", "TIBC", "IRON", "RETICCTPCT" in the last 72 hours. Sepsis Labs: Recent Labs  Lab 11/12/22 1602 11/13/22 0422  LATICACIDVEN 1.1 0.7    Recent Results (from the past 240 hour(s))  Blood Culture (routine x 2)     Status: None (Preliminary result)   Collection Time: 11/12/22  4:31 PM   Specimen: BLOOD  Result Value Ref Range Status   Specimen Description BLOOD SITE NOT SPECIFIED  Final   Special Requests   Final  BOTTLES DRAWN AEROBIC AND ANAEROBIC Blood Culture results may not be optimal due to an inadequate volume of blood received in culture bottles   Culture   Final    NO GROWTH 3 DAYS Performed at [facility], [address] [place], [place] [address]    Report Status PENDING  Incomplete  Blood Culture (routine x 2)     Status: None (Preliminary result)   Collection Time: 11/12/22  5:20 PM   Specimen: BLOOD RIGHT ARM  Result Value Ref Range Status   Specimen Description BLOOD RIGHT ARM  Final   Special Requests   Final    BOTTLES DRAWN AEROBIC AND ANAEROBIC Blood Culture adequate volume   Culture   Final    NO GROWTH 3 DAYS Performed at [facility], [address], [place], [place] [address]    Report Status PENDING  Incomplete    Radiology Studies: No results found.  Scheduled Meds:  cephALEXin  500 mg Oral Q24H   insulin aspart  0-5 Units Subcutaneous QHS   insulin aspart  0-9 Units Subcutaneous TID WC    metoprolol succinate  50 mg Oral QPM   pantoprazole (PROTONIX) IV  40 mg Intravenous Q12H   Continuous Infusions:     LOS: 3 days    Time spent: 35 mins    Willeen Niece, MD [facility]   If 7PM-7AM, please contact night-coverage

## 2022-11-15 NOTE — Progress Notes (Signed)
[facility] Progress Note  SUBJECTIVE:   Interval history: Tabitha Black was seen and evaluated today at bedside.  Her daughter was present at bedside.  She was unable to tolerate bowel preparation overnight.  She is having brown-colored liquid stool with some formed pieces.  She is agreeable to attempting a different bowel preparation which may be more palatable for her.  Past Medical History:  Diagnosis Date   ABLA (acute blood loss anemia) 08/23/2022   CKD (chronic kidney disease)    Diabetes mellitus without complication (HCC)    type II   Gastrointestinal hemorrhage with melena 08/23/2022   GERD (gastroesophageal reflux disease)    High cholesterol    Hypertension    Osteoarthritis    Skin cancer, basal cell    Past Surgical History:  Procedure Laterality Date   ABDOMINAL HYSTERECTOMY N/A    APPENDECTOMY N/A 1953   BACK SURGERY     BIOPSY  08/25/2022   Procedure: BIOPSY;  Surgeon: Charlott Rakes, MD;  Location: [facility] ENDOSCOPY;  Service: Gastroenterology;;   CHOLECYSTECTOMY     COLONOSCOPY N/A 02/22/2008   ESOPHAGOGASTRODUODENOSCOPY (EGD) WITH PROPOFOL N/A 08/25/2022   Procedure: ESOPHAGOGASTRODUODENOSCOPY (EGD) WITH PROPOFOL;  Surgeon: Charlott Rakes, MD;  Location: [facility] ENDOSCOPY;  Service: Gastroenterology;  Laterality: N/A;   TOTAL KNEE ARTHROPLASTY Right    UMBILICAL HERNIA REPAIR N/A 04/02/2015   Procedure: LAPAROSCOPIC UMBILICAL HERNIA REPAIR WITH VENTRALIGHT MESH;  Surgeon: Axel Filler, MD;  Location: [facility] OR;  Service: General;  Laterality: N/A;   Current Facility-Administered Medications  Medication Dose Route Frequency Provider Last Rate Last Admin   acetaminophen (TYLENOL) tablet 650 mg  650 mg Oral Q6H PRN Carollee Herter, DO   650 mg at 11/15/22 1536   Or   acetaminophen (TYLENOL) suppository 650 mg  650 mg Rectal Q6H PRN Carollee Herter, DO       cephALEXin (KEFLEX) capsule 500 mg  500 mg Oral Q24H Mosetta Anis, RPH   500 mg at 11/15/22 4098   insulin  aspart (novoLOG) injection 0-5 Units  0-5 Units Subcutaneous QHS Carollee Herter, DO       insulin aspart (novoLOG) injection 0-9 Units  0-9 Units Subcutaneous TID WC Carollee Herter, DO   1 Units at 11/13/22 1226   metoprolol succinate (TOPROL-XL) 24 hr tablet 50 mg  50 mg Oral QPM Carollee Herter, DO   50 mg at 11/14/22 1704   ondansetron (ZOFRAN) tablet 4 mg  4 mg Oral Q6H PRN Carollee Herter, DO       Or   ondansetron [facility]) injection 4 mg  4 mg Intravenous Q6H PRN Carollee Herter, DO       pantoprazole (PROTONIX) injection 40 mg  40 mg Intravenous Q12H Brahmbhatt, Parag, MD   40 mg at 11/15/22 0843   Allergies as of 11/12/2022 - Review Complete 11/12/2022  Allergen Reaction Noted   Other Swelling 03/31/2015   Penicillins Itching, Other (See Comments), Rash, Shortness Of Breath, and Swelling 03/31/2015   Lovastatin Other (See Comments) 03/31/2015   Azithromycin  03/20/2021   Clindamycin  03/20/2021   Hydralazine  05/25/2021   Lisinopril Other (See Comments) 11/29/2016   Neomycin sulfate [neomycin] Other (See Comments) 11/29/2016   Niacin Hives 11/29/2016   Rofecoxib Other (See Comments) 05/16/2020   Tetracycline  03/20/2021   Valsartan Other (See Comments) 11/29/2016   Vytorin [ezetimibe-simvastatin] Other (See Comments) 11/29/2016   Zetia [ezetimibe] Other (See Comments) 11/29/2016   Review of Systems:  Review of Systems  Respiratory:  Positive for shortness of breath.   Cardiovascular:  Negative for chest pain.  Gastrointestinal:  Positive for diarrhea. Negative for blood in stool, nausea and vomiting.    OBJECTIVE:   Temp:  [97.5 F (36.4 C)-98.6 F (37 C)] 97.6 F (36.4 C) (04/29 1513) Pulse Rate:  [85-95] 92 (04/29 1513) Resp:  [17-21] 19 (04/29 1515) BP: (140-165)/(54-80) 146/70 (04/29 1513) SpO2:  [91 %-96 %] 93 % (04/29 1513) Weight:  [74.6 kg] 74.6 kg (04/29 0318) Last BM Date : 11/14/22 Physical Exam Constitutional:      General: She is not in acute distress.    Appearance: She  is not ill-appearing, toxic-appearing or diaphoretic.  Cardiovascular:     Rate and Rhythm: Rhythm irregular.  Pulmonary:     Effort: No respiratory distress.     Breath sounds: Normal breath sounds.  Abdominal:     General: Bowel sounds are normal. There is no distension.     Palpations: Abdomen is soft.     Tenderness: There is no abdominal tenderness. There is no guarding.  Musculoskeletal:     Right lower leg: Edema (Lymphedema) present.     Left lower leg: Edema (Lymphedema) present.  Neurological:     Mental Status: She is alert.     Labs: Recent Labs    11/12/22 1631 11/13/22 0422 11/14/22 0050  WBC 10.3 10.2 9.5  HGB 6.4* 8.3* 8.7*  HCT 21.3* 26.2* 27.0*  PLT 166 161 173   BMET Recent Labs    11/12/22 1631 11/13/22 0422 11/14/22 0050  NA 135 135 136  K 3.8 3.2* 3.6  CL 101 102 104  CO2 20* 20* 20*  GLUCOSE 106* 105* 112*  BUN 108* 99* 91*  CREATININE 3.21* 3.19* 3.26*  CALCIUM 8.8* 8.6* 8.9   LFT Recent Labs    11/13/22 0422  PROT 5.3*  ALBUMIN 2.4*  AST 30  ALT 15  ALKPHOS 26*  BILITOT 1.7*   PT/INR Recent Labs    11/12/22 1631  LABPROT 17.4*  INR 1.4*   Diagnostic imaging: No results found.  IMPRESSION: Melena  -EGD 11/14/2022 showed 1 nonbleeding superficial gastric ulcer, gastritis Acute blood loss anemia  -Iron studies appear normal on 10/26/2022 Atrial fibrillation, on Eliquis prior to admission Chronic kidney disease Hypertension Osteoarthritis  PLAN: -Patient agreeable to attempting Gatorade/MiraLAX bowel preparation this afternoon -Patient agreeable to colonoscopy 11/16/2022 -Trend H/H, transfuse for hemoglobin less than 7 -Okay for clear liquid diet until midnight tonight -Further recommendations to follow pending endoscopy   LOS: 3 days   Liliane Shi, [facility]

## 2022-11-15 NOTE — Care Management Important Message (Signed)
Important Message  Patient Details  Name: Tabitha Black MRN: [id number] Date of Birth: 28-Jan-1933   Medicare Important Message Given:  Yes     Renie Ora 11/15/2022, 11:05 AM

## 2022-11-15 NOTE — Progress Notes (Signed)
Patient refused to complete full bowel prep despite frequent reminders and encouragement from RN. Primary RN educated patient on risks associated with refusal. Patient understand teaching from RN. MD on call (Dr. Lorenso Quarry) notified. Patient care continues.

## 2022-11-15 NOTE — TOC Progression Note (Signed)
Transition of Care [facility]) - Progression Note    Patient Details  Name: Tabitha Black MRN: [id number] Date of Birth: 1932-11-26  [facility] of [facility] CM/SW Contact  Leone Haven, RN Phone Number: 11/15/2022, 3:53 PM  Clinical Narrative:    from home, GIB, for colonscopy today, back on clears today. Will benefit from pt eval.  TOC following.        Expected Discharge Plan and Services                                               Social Determinants of Health (SDOH) Interventions SDOH Screenings   Food Insecurity: No Food Insecurity (11/12/2022)  Housing: Low Risk  (11/12/2022)  Transportation Needs: No Transportation Needs (11/12/2022)  Utilities: Not At Risk (11/12/2022)  Tobacco Use: Low Risk  (11/14/2022)    Readmission Risk Interventions     No data to display

## 2022-11-15 NOTE — H&P (View-Only) (Signed)
[facility] Progress Note  SUBJECTIVE:   Interval history: Tabitha Black was seen and evaluated today at bedside.  Her daughter was present at bedside.  Tabitha Black was unable to tolerate bowel preparation overnight.  Tabitha Black is having brown-colored liquid stool with some formed pieces.  Tabitha Black is agreeable to attempting a different bowel preparation which may be more palatable for her.  Past Medical History:  Diagnosis Date   ABLA (acute blood loss anemia) 08/23/2022   CKD (chronic kidney disease)    Diabetes mellitus without complication (HCC)    type II   Gastrointestinal hemorrhage with melena 08/23/2022   GERD (gastroesophageal reflux disease)    High cholesterol    Hypertension    Osteoarthritis    Skin cancer, basal cell    Past Surgical History:  Procedure Laterality Date   ABDOMINAL HYSTERECTOMY N/A    APPENDECTOMY N/A 1953   BACK SURGERY     BIOPSY  08/25/2022   Procedure: BIOPSY;  Surgeon: Schooler, Vincent, MD;  Location: [facility] ENDOSCOPY;  Service: Gastroenterology;;   CHOLECYSTECTOMY     COLONOSCOPY N/A 02/22/2008   ESOPHAGOGASTRODUODENOSCOPY (EGD) WITH PROPOFOL N/A 08/25/2022   Procedure: ESOPHAGOGASTRODUODENOSCOPY (EGD) WITH PROPOFOL;  Surgeon: Schooler, Vincent, MD;  Location: [facility] ENDOSCOPY;  Service: Gastroenterology;  Laterality: N/A;   TOTAL KNEE ARTHROPLASTY Right    UMBILICAL HERNIA REPAIR N/A 04/02/2015   Procedure: LAPAROSCOPIC UMBILICAL HERNIA REPAIR WITH VENTRALIGHT MESH;  Surgeon: Armando Ramirez, MD;  Location: [facility] OR;  Service: General;  Laterality: N/A;   Current Facility-Administered Medications  Medication Dose Route Frequency Provider Last Rate Last Admin   acetaminophen (TYLENOL) tablet 650 mg  650 mg Oral Q6H PRN Chen, Eric, DO   650 mg at 11/15/22 1536   Or   acetaminophen (TYLENOL) suppository 650 mg  650 mg Rectal Q6H PRN Chen, Eric, DO       cephALEXin (KEFLEX) capsule 500 mg  500 mg Oral Q24H Bitonti, Michael T, RPH   500 mg at 11/15/22 0647   insulin  aspart (novoLOG) injection 0-5 Units  0-5 Units Subcutaneous QHS Chen, Eric, DO       insulin aspart (novoLOG) injection 0-9 Units  0-9 Units Subcutaneous TID WC Chen, Eric, DO   1 Units at 11/13/22 1226   metoprolol succinate (TOPROL-XL) 24 hr tablet 50 mg  50 mg Oral QPM Chen, Eric, DO   50 mg at 11/14/22 1704   ondansetron (ZOFRAN) tablet 4 mg  4 mg Oral Q6H PRN Chen, Eric, DO       Or   ondansetron (ZOFRAN) injection 4 mg  4 mg Intravenous Q6H PRN Chen, Eric, DO       pantoprazole (PROTONIX) injection 40 mg  40 mg Intravenous Q12H Brahmbhatt, Parag, MD   40 mg at 11/15/22 0843   Allergies as of 11/12/2022 - Review Complete 11/12/2022  Allergen Reaction Noted   Other Swelling 03/31/2015   Penicillins Itching, Other (See Comments), Rash, Shortness Of Breath, and Swelling 03/31/2015   Lovastatin Other (See Comments) 03/31/2015   Azithromycin  03/20/2021   Clindamycin  03/20/2021   Hydralazine  05/25/2021   Lisinopril Other (See Comments) 11/29/2016   Neomycin sulfate [neomycin] Other (See Comments) 11/29/2016   Niacin Hives 11/29/2016   Rofecoxib Other (See Comments) 05/16/2020   Tetracycline  03/20/2021   Valsartan Other (See Comments) 11/29/2016   Vytorin [ezetimibe-simvastatin] Other (See Comments) 11/29/2016   Zetia [ezetimibe] Other (See Comments) 11/29/2016   Review of Systems:  Review of Systems  Respiratory:    Positive for shortness of breath.   Cardiovascular:  Negative for chest pain.  Gastrointestinal:  Positive for diarrhea. Negative for blood in stool, nausea and vomiting.    OBJECTIVE:   Temp:  [97.5 F (36.4 C)-98.6 F (37 C)] 97.6 F (36.4 C) (04/29 1513) Pulse Rate:  [85-95] 92 (04/29 1513) Resp:  [17-21] 19 (04/29 1515) BP: (140-165)/(54-80) 146/70 (04/29 1513) SpO2:  [91 %-96 %] 93 % (04/29 1513) Weight:  [74.6 kg] 74.6 kg (04/29 0318) Last BM Date : 11/14/22 Physical Exam Constitutional:      General: Tabitha Black is not in acute distress.    Appearance: Tabitha Black  is not ill-appearing, toxic-appearing or diaphoretic.  Cardiovascular:     Rate and Rhythm: Rhythm irregular.  Pulmonary:     Effort: No respiratory distress.     Breath sounds: Normal breath sounds.  Abdominal:     General: Bowel sounds are normal. There is no distension.     Palpations: Abdomen is soft.     Tenderness: There is no abdominal tenderness. There is no guarding.  Musculoskeletal:     Right lower leg: Edema (Lymphedema) present.     Left lower leg: Edema (Lymphedema) present.  Neurological:     Mental Status: Tabitha Black is alert.     Labs: Recent Labs    11/12/22 1631 11/13/22 0422 11/14/22 0050  WBC 10.3 10.2 9.5  HGB 6.4* 8.3* 8.7*  HCT 21.3* 26.2* 27.0*  PLT 166 161 173   BMET Recent Labs    11/12/22 1631 11/13/22 0422 11/14/22 0050  NA 135 135 136  K 3.8 3.2* 3.6  CL 101 102 104  CO2 20* 20* 20*  GLUCOSE 106* 105* 112*  BUN 108* 99* 91*  CREATININE 3.21* 3.19* 3.26*  CALCIUM 8.8* 8.6* 8.9   LFT Recent Labs    11/13/22 0422  PROT 5.3*  ALBUMIN 2.4*  AST 30  ALT 15  ALKPHOS 26*  BILITOT 1.7*   PT/INR Recent Labs    11/12/22 1631  LABPROT 17.4*  INR 1.4*   Diagnostic imaging: No results found.  IMPRESSION: Melena  -EGD 11/14/2022 showed 1 nonbleeding superficial gastric ulcer, gastritis Acute blood loss anemia  -Iron studies appear normal on 10/26/2022 Atrial fibrillation, on Eliquis prior to admission Chronic kidney disease Hypertension Osteoarthritis  PLAN: -Patient agreeable to attempting Gatorade/MiraLAX bowel preparation this afternoon -Patient agreeable to colonoscopy 11/16/2022 -Trend H/H, transfuse for hemoglobin less than 7 -Okay for clear liquid diet until midnight tonight -Further recommendations to follow pending endoscopy   LOS: 3 days   Gisselle Galvis, DO [facility]    

## 2022-11-16 ENCOUNTER — Encounter (HOSPITAL_COMMUNITY)
Admission: EM | Disposition: A | Discharge status: Home / Self Care | Payer: Self-pay | Source: Home / Self Care | Attending: Family Medicine

## 2022-11-16 ENCOUNTER — Inpatient Hospital Stay (HOSPITAL_COMMUNITY): Payer: Medicare Other | Admitting: Certified Registered Nurse Anesthetist

## 2022-11-16 DIAGNOSIS — K644 Residual hemorrhoidal skin tags: Secondary | ICD-10-CM

## 2022-11-16 DIAGNOSIS — D62 Acute posthemorrhagic anemia: Secondary | ICD-10-CM | POA: Diagnosis not present

## 2022-11-16 DIAGNOSIS — I110 Hypertensive heart disease with heart failure: Secondary | ICD-10-CM | POA: Diagnosis not present

## 2022-11-16 DIAGNOSIS — K5731 Diverticulosis of large intestine without perforation or abscess with bleeding: Secondary | ICD-10-CM

## 2022-11-16 DIAGNOSIS — K922 Gastrointestinal hemorrhage, unspecified: Secondary | ICD-10-CM | POA: Diagnosis not present

## 2022-11-16 DIAGNOSIS — I509 Heart failure, unspecified: Secondary | ICD-10-CM

## 2022-11-16 HISTORY — PX: COLONOSCOPY: SHX5424

## 2022-11-16 LAB — CBC
HCT: 26.4 % — ABNORMAL LOW (ref 36.0–46.0)
Hemoglobin: 8.7 g/dL — ABNORMAL LOW (ref 12.0–15.0)
MCH: 30.6 pg (ref 26.0–34.0)
MCHC: 33 g/dL (ref 30.0–36.0)
MCV: 93 fL (ref 80.0–100.0)
Platelets: 180 10*3/uL (ref 150–400)
RBC: 2.84 MIL/uL — ABNORMAL LOW (ref 3.87–5.11)
RDW: 16.9 % — ABNORMAL HIGH (ref 11.5–15.5)
WBC: 4.9 10*3/uL (ref 4.0–10.5)
nRBC: 0 % (ref 0.0–0.2)

## 2022-11-16 LAB — HEMOGLOBIN AND HEMATOCRIT, BLOOD
HCT: 29.1 % — ABNORMAL LOW (ref 36.0–46.0)
Hemoglobin: 9.7 g/dL — ABNORMAL LOW (ref 12.0–15.0)

## 2022-11-16 LAB — GLUCOSE, CAPILLARY
Glucose-Capillary: 106 mg/dL — ABNORMAL HIGH (ref 70–99)
Glucose-Capillary: 139 mg/dL — ABNORMAL HIGH (ref 70–99)

## 2022-11-16 LAB — BASIC METABOLIC PANEL
Anion gap: 9 (ref 5–15)
BUN: 69 mg/dL — ABNORMAL HIGH (ref 8–23)
CO2: 21 mmol/L — ABNORMAL LOW (ref 22–32)
Calcium: 8.8 mg/dL — ABNORMAL LOW (ref 8.9–10.3)
Chloride: 104 mmol/L (ref 98–111)
Creatinine, Ser: 2.81 mg/dL — ABNORMAL HIGH (ref 0.44–1.00)
GFR, Estimated: 16 mL/min — ABNORMAL LOW (ref >60)
Glucose, Bld: 108 mg/dL — ABNORMAL HIGH (ref 70–99)
Potassium: 3.1 mmol/L — ABNORMAL LOW (ref 3.5–5.1)
Sodium: 134 mmol/L — ABNORMAL LOW (ref 135–145)

## 2022-11-16 LAB — PHOSPHORUS: Phosphorus: 3.5 mg/dL (ref 2.5–4.6)

## 2022-11-16 LAB — MAGNESIUM: Magnesium: 2.1 mg/dL (ref 1.7–2.4)

## 2022-11-16 LAB — CULTURE, BLOOD (ROUTINE X 2): Culture: NO GROWTH

## 2022-11-16 SURGERY — COLONOSCOPY
Anesthesia: Monitor Anesthesia Care

## 2022-11-16 MED ORDER — PROPOFOL 500 MG/50ML IV EMUL
INTRAVENOUS | Status: DC | PRN
  Administered 2022-11-16: 75 ug/kg/min via INTRAVENOUS

## 2022-11-16 MED ORDER — PROPOFOL 10 MG/ML IV BOLUS
INTRAVENOUS | Status: DC | PRN
  Administered 2022-11-16: 40 mg via INTRAVENOUS

## 2022-11-16 MED ORDER — METOPROLOL SUCCINATE ER 50 MG PO TB24: 50.0000 mg | ORAL_TABLET | Freq: Every evening | ORAL | 1 refills | Status: AC

## 2022-11-16 MED ORDER — POTASSIUM CHLORIDE 10 MEQ/100ML IV SOLN
10.0000 meq | INTRAVENOUS | Status: DC
  Administered 2022-11-16: 10 meq via INTRAVENOUS
  Filled 2022-11-16 (×2): qty 100

## 2022-11-16 MED ORDER — POTASSIUM CHLORIDE 20 MEQ PO PACK
40.0000 meq | PACK | Freq: Once | ORAL | Status: AC
  Administered 2022-11-16: 40 meq via ORAL
  Filled 2022-11-16: qty 2

## 2022-11-16 NOTE — Progress Notes (Signed)
PROGRESS NOTE    Tabitha Black  ZOX:[id number] DOB: October 09, 1932 DOA: 11/12/2022  PCP: Irven Coe, MD   Brief Narrative:  This 89 year old female with PMH significant for morbid obesity, bilateral lower extremity lymphedema, Chronic diastolic heart failure, CKD stage IV with baseline creatinine 2.9-3.1, chronic A-fib on Eliquis, hypertension, presented in the ED with GI bleeding for the last 5 days. Patient had a GI bleed in February 2024. She underwent EGD which demonstrated gastric ulcerations. Pathology was negative for H. pylori. She was restarted on her outpatient Eliquis. Patient states that she has been on her Eliquis this whole week. This is despite having GI bleeding that started on Monday. Patient reports feeling fatigued and weak.  She is found to have hemoglobin 6.4.  S/p 2 unit PRBC.  CT abdomen and pelvis demonstrated no acute bleeding.  Patient is admitted for further evaluation..  GI is consulted.  Patient underwent EGD found to have nonbleeding gastric ulcers, gastritis, duodenitis. Colonoscopy showed diverticulosis, no signs of bleeding.  PT and OT recommended skilled nursing facility.  Assessment & Plan:   Principal Problem:   GI bleeding Active Problems:   Cellulitis of right leg   Chronic diastolic CHF (congestive heart failure) (HCC)   Chronic kidney disease, stage 4 (severe) (HCC) -Baseline Scr 2.9-3.1   Gastroesophageal reflux disease   Morbid obesity (HCC)   HTN (hypertension)   A-fib (HCC)   DM2 (diabetes mellitus, type 2) (HCC)   Lymphedema of both lower extremities - R > L  GI bleeding: Patient reports having black-colored stools in the setting of Eliquis use. She reports having bleeding for last 1 week, She continued to take Eliquis. Hemoglobin on arrival 6.4, transfused 2 unit PRBC. Hemoglobin s/p transfusion improved to 8.2.  [facility] is consulted.   Patient underwent EGD found to have nonbleeding gastric ulcer,  duodenitis gastritis. Patient underwent  colonoscopy found to have diverticulosis, no active bleeding. Eliquis resumed. GI signed off.  Cellulitis of right leg Continue Keflex 500 every 8 hours.  Lymphedema of both lower extremities:  Chronic , stable.  Diabetes mellitus with hyperglycemia: Continue sliding scale. Carb modified diet.  Atrial fibrillation: Heart rate is well-controlled. Continue Toprol-XL Resume Eliquis.  Essential hypertension: Continue Toprol-XL.  AKI on chronic kidney disease stage IV: Serum creatinine slightly trended up to 3.24 Likely in the setting of GI bleed, continue IV fluid resuscitation. Avoid nephrotoxic medications.  Recheck am lab  Chronic diastolic CHF: Does not appear euvolemic.   Continue diuresis in between and after transfusion.   DVT prophylaxis:SCDs Code Status:Full code. Family Communication: Daughter at bedside. Disposition Plan:   Status is: Inpatient Remains inpatient appropriate because: Admitted for GI bleeding in the setting of Eliquis use.   GI is consulted , underwent EGD, colonoscopy found to have diverticulosis, no signs of active bleeding.  PT and OT recommended SNF.  Awaiting insurance authorization.    Consultants:  Gastroenterology  Procedures: EGD and colonoscopy Antimicrobials: Keflex p.o.  Subjective: Patient was seen and examined at bedside.  Overnight events noted.   Patient appears very anxious, underwent colonoscopy, tolerated well. Daughter is concerned about unsteadiness.  PT and OT recommended SNF.  Objective: Vitals:   11/16/22 0920 11/16/22 0930 11/16/22 0940 11/16/22 1048  BP: (!) 110/48 (!) 141/76 (!) 149/71 (!) 136/50  Pulse: 88 94 94 84  Resp: (!) 21 (!) 21 (!) 23 17  Temp:    (!) 97.5 F (36.4 C)  TempSrc:    Oral  SpO2: 95% 97%  99% 95%  Weight:      Height:        Intake/Output Summary (Last 24 hours) at 11/16/2022 1346 Last data filed at 11/16/2022 0910 Gross per 24 hour  Intake 253.1 ml  Output 175 ml  Net 78.1 ml    Filed Weights   11/14/22 0641 11/15/22 0318 11/16/22 0419  Weight: 73.5 kg 74.6 kg 74.6 kg    Examination:  General exam: Appears comfortable, deconditioned, not in any acute distress. Respiratory system: CTA bilaterally, respiratory effort normal, RR 14. Cardiovascular system: S1 & S2 heard, Irregular rhythm, no murmur. Gastrointestinal system: Abdomen is soft, non tender, non distended, BS+ Central nervous system: Alert and oriented x 3. No focal neurological deficits. Extremities: Chronic lymphedematous changes with slight erythema. Skin: No rashes, lesions or ulcers Psychiatry: Judgement and insight appear normal. Mood & affect appropriate.     Data Reviewed: I have personally reviewed following labs and imaging studies  CBC: Recent Labs  Lab 11/12/22 1631 11/13/22 0422 11/14/22 0050 11/16/22 0033 11/16/22 1256  WBC 10.3 10.2 9.5 4.9  --   NEUTROABS 9.2* 8.9*  --   --   --   HGB 6.4* 8.3* 8.7* 8.7* 9.7*  HCT 21.3* 26.2* 27.0* 26.4* 29.1*  MCV 100.5* 93.6 94.1 93.0  --   PLT 166 161 173 180  --    Basic Metabolic Panel: Recent Labs  Lab 11/12/22 1631 11/13/22 0422 11/14/22 0050 11/16/22 0033  NA 135 135 136 134*  K 3.8 3.2* 3.6 3.1*  CL 101 102 104 104  CO2 20* 20* 20* 21*  GLUCOSE 106* 105* 112* 108*  BUN 108* 99* 91* 69*  CREATININE 3.21* 3.19* 3.26* 2.81*  CALCIUM 8.8* 8.6* 8.9 8.8*  MG  --  1.7 2.3 2.1  PHOS  --   --  4.0 3.5   GFR: Estimated Creatinine Clearance: 13.6 mL/min (A) (by C-G formula based on SCr of 2.81 mg/dL (H)). Liver Function Tests: Recent Labs  Lab 11/12/22 1631 11/13/22 0422  AST 38 30  ALT 14 15  ALKPHOS 26* 26*  BILITOT 1.3* 1.7*  PROT 5.8* 5.3*  ALBUMIN 2.7* 2.4*   No results for input(s): "LIPASE", "AMYLASE" in the last 168 hours. No results for input(s): "AMMONIA" in the last 168 hours. Coagulation Profile: Recent Labs  Lab 11/12/22 1631  INR 1.4*   Cardiac Enzymes: No results for input(s): "CKTOTAL",  "CKMB", "CKMBINDEX", "TROPONINI" in the last 168 hours. BNP (last 3 results) No results for input(s): "PROBNP" in the last 8760 hours. HbA1C: No results for input(s): "HGBA1C" in the last 72 hours. CBG: Recent Labs  Lab 11/15/22 1103 11/15/22 1629 11/15/22 2120 11/16/22 0643 11/16/22 1052  GLUCAP 91 135* 138* 106* 139*   Lipid Profile: No results for input(s): "CHOL", "HDL", "LDLCALC", "TRIG", "CHOLHDL", "LDLDIRECT" in the last 72 hours. Thyroid Function Tests: No results for input(s): "TSH", "T4TOTAL", "FREET4", "T3FREE", "THYROIDAB" in the last 72 hours. Anemia Panel: No results for input(s): "VITAMINB12", "FOLATE", "FERRITIN", "TIBC", "IRON", "RETICCTPCT" in the last 72 hours. Sepsis Labs: Recent Labs  Lab 11/12/22 1602 11/13/22 0422  LATICACIDVEN 1.1 0.7    Recent Results (from the past 240 hour(s))  Blood Culture (routine x 2)     Status: None (Preliminary result)   Collection Time: 11/12/22  4:31 PM   Specimen: BLOOD  Result Value Ref Range Status   Specimen Description BLOOD SITE NOT SPECIFIED  Final   Special Requests   Final    BOTTLES DRAWN AEROBIC  AND ANAEROBIC Blood Culture results may not be optimal due to an inadequate volume of blood received in culture bottles   Culture   Final    NO GROWTH 4 DAYS Performed at [facility], [address] [place], [place] [address]    Report Status PENDING  Incomplete  Blood Culture (routine x 2)     Status: None (Preliminary result)   Collection Time: 11/12/22  5:20 PM   Specimen: BLOOD RIGHT ARM  Result Value Ref Range Status   Specimen Description BLOOD RIGHT ARM  Final   Special Requests   Final    BOTTLES DRAWN AEROBIC AND ANAEROBIC Blood Culture adequate volume   Culture   Final    NO GROWTH 4 DAYS Performed at [facility], [address] [place], [place] [address]    Report Status PENDING  Incomplete    Radiology Studies: No results found.  Scheduled Meds:  cephALEXin  500 mg Oral Q24H    insulin aspart  0-5 Units Subcutaneous QHS   insulin aspart  0-9 Units Subcutaneous TID WC   metoprolol succinate  50 mg Oral QPM   pantoprazole (PROTONIX) IV  40 mg Intravenous Q12H   Continuous Infusions:     LOS: 4 days    Time spent: 35 mins    Willeen Niece, MD [facility]   If 7PM-7AM, please contact night-coverage

## 2022-11-16 NOTE — Evaluation (Signed)
Occupational Therapy Evaluation Patient Details Name: Tabitha Black MRN: [id number] DOB: 1933-04-19 Today's Date: 11/16/2022   History of Present Illness Pt is an 89 y/o F presenting to ED on 4/26 with dark stools, weakness, admitted for GI Bleed. PMH includes BLE lymphedema, chronic diastolic heart failure, CKD IV, morbid obesity, A fib on Eliquis, HTN, recent admission 08/2022 for GI bleed.    Clinical Impression   Pt reports independence at baseline with ADLs, uses RW for functional mobility. Lives alone but reports sister lives next door, and daughter plans to stay until Saturday (11/20/2022). Pt needing min-max A for ADLs, mod A +2 for bed mobility, and min A +2 for transfers with RW. Pt reports dizziness and feeling weak, BP WNL. Pt presenting with impairments listed below, will follow acutely. Patient will benefit from continued inpatient follow up therapy, <3 hours/day to maximize safety and independence with ADLs/functional mobility.      Recommendations for follow up therapy are one component of a multi-disciplinary discharge planning process, led by the attending physician.  Recommendations may be updated based on patient status, additional functional criteria and insurance authorization.   Assistance Recommended at Discharge Frequent or constant Supervision/Assistance  Patient can return home with the following A lot of help with walking and/or transfers;A lot of help with bathing/dressing/bathroom;Direct supervision/assist for medications management;Assistance with cooking/housework;Direct supervision/assist for financial management;Assist for transportation;Help with stairs or ramp for entrance    Functional Status Assessment  Patient has had a recent decline in their functional status and demonstrates the ability to make significant improvements in function in a reasonable and predictable amount of time.  Equipment Recommendations  Other (comment) (defer)    Recommendations for  Other Services PT consult     Precautions / Restrictions Precautions Precautions: Fall Precaution Comments: Pt has BLE lymphedema. Has socks and shoes in room. Restrictions Weight Bearing Restrictions: No      Mobility Bed Mobility Overal bed mobility: Needs Assistance Bed Mobility: Supine to Sit     Supine to sit: Mod assist, +2 for physical assistance          Transfers Overall transfer level: Needs assistance Equipment used: Rolling walker (2 wheels) Transfers: Sit to/from Stand Sit to Stand: Min assist, +2 physical assistance           General transfer comment: reports feeling weak      Balance Overall balance assessment: Needs assistance Sitting-balance support: Feet supported Sitting balance-Leahy Scale: Fair Sitting balance - Comments: posterior lean in sitting   Standing balance support: During functional activity, Reliant on assistive device for balance Standing balance-Leahy Scale: Poor Standing balance comment: reliant on external support                           ADL either performed or assessed with clinical judgement   ADL Overall ADL's : Needs assistance/impaired Eating/Feeding: Set up;Sitting   Grooming: Minimal assistance;Standing   Upper Body Bathing: Moderate assistance;Sitting   Lower Body Bathing: Maximal assistance;Sitting/lateral leans;Sit to/from stand   Upper Body Dressing : Moderate assistance;Sitting   Lower Body Dressing: Maximal assistance;Sitting/lateral leans;Sit to/from stand   Toilet Transfer: Minimal assistance;+2 for physical assistance   Toileting- Clothing Manipulation and Hygiene: Maximal assistance               Vision   Vision Assessment?: No apparent visual deficits     Perception Perception Perception Tested?: No   Praxis Praxis Praxis tested?: Not tested  Pertinent Vitals/Pain Pain Assessment Pain Assessment: No/denies pain     Hand Dominance Right   Extremity/Trunk  Assessment Upper Extremity Assessment Upper Extremity Assessment: Defer to OT evaluation   Lower Extremity Assessment Lower Extremity Assessment: Overall WFL for tasks assessed   Cervical / Trunk Assessment Cervical / Trunk Assessment: Kyphotic   Communication Communication Communication: HOH   Cognition Arousal/Alertness: Awake/alert Behavior During Therapy: Flat affect Overall Cognitive Status: Within Functional Limits for tasks assessed                                 General Comments: able to state why she is in the hospital, slowed processing noted     General Comments  VSS on RA    Exercises     Shoulder Instructions      Home Living Family/patient expects to be discharged to:: Private residence Living Arrangements: Alone Available Help at Discharge: Family;Available PRN/intermittently Type of Home: House Home Access: Ramped entrance     Home Layout: One level     Bathroom Shower/Tub: Producer, television/film/video: Standard     Home Equipment: Toilet riser;Shower seat;Grab bars - toilet;Grab bars - tub/shower;Other (comment);Rolling Walker (2 wheels)   Additional Comments: Sleeps in lift chair      Prior Functioning/Environment Prior Level of Function : Needs assist             Mobility Comments: uses RW ADLs Comments: ind; family assists with transportation        OT Problem List: Decreased strength;Decreased range of motion;Decreased activity tolerance;Impaired balance (sitting and/or standing);Decreased safety awareness      OT Treatment/Interventions: Self-care/ADL training;Therapeutic exercise;DME and/or AE instruction;Energy conservation;Therapeutic activities;Patient/family education;Balance training    OT Goals(Current goals can be found in the care plan section) Acute Rehab OT Goals Patient Stated Goal: none stated OT Goal Formulation: With patient Time For Goal Achievement: 11/30/22 Potential to Achieve Goals:  Good  OT Frequency: Min 1X/week    Co-evaluation PT/OT/SLP Co-Evaluation/Treatment: Yes Reason for Co-Treatment: For patient/therapist safety;To address functional/ADL transfers;Other (comment) (Imminent DC) PT goals addressed during session: Mobility/safety with mobility;Proper use of DME OT goals addressed during session: ADL's and self-care      AM-PAC OT "6 Clicks" Daily Activity     Outcome Measure Help from another person eating meals?: A Little Help from another person taking care of personal grooming?: A Little Help from another person toileting, which includes using toliet, bedpan, or urinal?: A Lot Help from another person bathing (including washing, rinsing, drying)?: A Lot Help from another person to put on and taking off regular upper body clothing?: A Lot Help from another person to put on and taking off regular lower body clothing?: A Lot 6 Click Score: 14   End of Session Equipment Utilized During Treatment: Gait belt;Rolling walker (2 wheels) Nurse Communication: Mobility status  Activity Tolerance: Patient tolerated treatment well Patient left: in chair;with call bell/phone within reach;with family/visitor present  OT Visit Diagnosis: Unsteadiness on feet (R26.81);Other abnormalities of gait and mobility (R26.89);Muscle weakness (generalized) (M62.81)                Time: 1610-9604 OT Time Calculation (min): 27 min Charges:  OT General Charges $OT Visit: 1 Visit OT Evaluation $OT Eval Moderate Complexity: 1 Mod  Gustave Lindeman K, OTD, OTR/L SecureChat Preferred Acute Rehab [phone]   Mccauley Diehl K Koonce 11/16/2022, 1:02 PM

## 2022-11-16 NOTE — NC FL2 (Signed)
[place] MEDICAID FL2 LEVEL OF CARE FORM     IDENTIFICATION  Patient Name: Tabitha Black Birthdate: [birth year] Sex: female Admission Date (Current Location): 11/12/2022  [facility] and [place] Number:  Producer, television/film/video and Address:  [employer] [facility], [address], [place], [place] [address]      Provider Number: [id number]  Attending Physician Name and Address:  Willeen Niece, MD  Relative Name and Phone Number:  [name] (daughter) [phone]    Current Level of Care: Hospital Recommended Level of Care: Skilled Nursing Facility Prior Approval Number:    Date Approved/Denied:   PASRR Number: [id number]  Discharge Plan: SNF    Current Diagnoses: Patient Active Problem List   Diagnosis Date Noted   GI bleeding 11/12/2022   Cellulitis of right leg 11/12/2022   Lymphedema of both lower extremities - R > L 10/04/2022   HTN (hypertension) 08/23/2022   A-fib (HCC) 08/23/2022   DM2 (diabetes mellitus, type 2) (HCC) 08/23/2022   Pain in joint of left knee 01/06/2021   Adverse effect of antihyperlipidemic and antiarteriosclerotic drugs, initial encounter 08/22/2020   Age-related physical debility 08/22/2020   Chronic diastolic CHF (congestive heart failure) (HCC) 08/22/2020   Chronic kidney disease, stage 4 (severe) (HCC) -Baseline Scr 2.9-3.1 08/22/2020   Diabetic renal disease (HCC) 08/22/2020   Gastroesophageal reflux disease 08/22/2020   History of malignant neoplasm of skin 08/22/2020   Hypertensive heart and chronic kidney disease with heart failure and stage 1 through stage 4 chronic kidney disease, or unspecified chronic kidney disease (HCC) 08/22/2020   Iron deficiency anemia 08/22/2020   Mixed hyperlipidemia 08/22/2020   Morbid obesity (HCC) 08/22/2020   Osteoarthritis 08/22/2020   Personal history of colonic polyps 08/22/2020   Skin ulcer (HCC) 08/22/2020   Cervical spondylosis with radiculopathy 07/26/2017    Orientation  RESPIRATION BLADDER Height & Weight     Self, Time, Situation, Place  Normal (2 liters nasal cannula) Incontinent, External catheter Weight: 74.6 kg Height:  5\' 3"  (160 cm)  BEHAVIORAL SYMPTOMS/MOOD NEUROLOGICAL BOWEL NUTRITION STATUS      Incontinent Diet (see DC summary)  AMBULATORY STATUS COMMUNICATION OF NEEDS Skin   Extensive Assist Verbally Normal                       Personal Care Assistance Level of Assistance  Bathing, Feeding, Dressing Bathing Assistance: Maximum assistance Feeding assistance: Limited assistance Dressing Assistance: Maximum assistance     Functional Limitations Info  Sight, Hearing, Speech Sight Info: Adequate Hearing Info: Adequate Speech Info: Adequate    SPECIAL CARE FACTORS FREQUENCY  PT (By licensed PT), OT (By licensed OT)     PT Frequency: 5x/week OT Frequency: 5x/week            Contractures Contractures Info: Not present    Additional Factors Info  Code Status, Allergies Code Status Info: Full Allergies Info: Other, Penicillins, Lovastatin, Azithromycin, Clindamycin, Hydralazine, Lisinopril, Neomycin Sulfate (Neomycin), Niacin, Rofecoxib, Tetracycline, Valsartan, Vytorin (Ezetimibe-simvastatin), Zetia (Ezetimibe)           Current Medications (11/16/2022):  This is the current hospital active medication list Current Facility-Administered Medications  Medication Dose Route Frequency Provider Last Rate Last Admin   acetaminophen (TYLENOL) tablet 650 mg  650 mg Oral Q6H PRN Carollee Herter, DO   650 mg at 11/15/22 1536   Or   acetaminophen (TYLENOL) suppository 650 mg  650 mg Rectal Q6H PRN Carollee Herter, DO  cephALEXin (KEFLEX) capsule 500 mg  500 mg Oral Q24H Mosetta Anis, RPH   500 mg at 11/16/22 4098   insulin aspart (novoLOG) injection 0-5 Units  0-5 Units Subcutaneous QHS Carollee Herter, DO       insulin aspart (novoLOG) injection 0-9 Units  0-9 Units Subcutaneous TID WC Carollee Herter, DO   1 Units at 11/16/22 1130    metoprolol succinate (TOPROL-XL) 24 hr tablet 50 mg  50 mg Oral QPM Carollee Herter, DO   50 mg at 11/15/22 1637   ondansetron (ZOFRAN) tablet 4 mg  4 mg Oral Q6H PRN Carollee Herter, DO       Or   ondansetron [facility]) injection 4 mg  4 mg Intravenous Q6H PRN Carollee Herter, DO   4 mg at 11/16/22 0645   pantoprazole (PROTONIX) injection 40 mg  40 mg Intravenous Q12H Brahmbhatt, Parag, MD   40 mg at 11/16/22 1002     Discharge Medications: Please see discharge summary for a list of discharge medications.  Relevant Imaging Results:  Relevant Lab Results:   Additional Information [phone]  Leone Haven, RN

## 2022-11-16 NOTE — Anesthesia Postprocedure Evaluation (Signed)
Anesthesia Post Note  Patient: Tabitha Black  Procedure(s) Performed: COLONOSCOPY     Patient location during evaluation: PACU Anesthesia Type: MAC Level of consciousness: awake and alert Pain management: pain level controlled Vital Signs Assessment: post-procedure vital signs reviewed and stable Respiratory status: spontaneous breathing, nonlabored ventilation and respiratory function stable Cardiovascular status: blood pressure returned to baseline and stable Postop Assessment: no apparent nausea or vomiting Anesthetic complications: no   No notable events documented.  Last Vitals:  Vitals:   11/16/22 0930 11/16/22 0940  BP: (!) 141/76 (!) 149/71  Pulse: 94 94  Resp: (!) 21 (!) 23  Temp:    SpO2: 97% 99%    Last Pain:  Vitals:   11/16/22 0940  TempSrc:   PainSc: 0-No pain                 Lowella Curb

## 2022-11-16 NOTE — TOC Progression Note (Signed)
Transition of Care [facility]) - Progression Note    Patient Details  Name: Tabitha Black MRN: [id number] Date of Birth: 1933/04/05  [facility] CM/SW Contact  Leander Rams, LCSW Phone Number: 11/16/2022, 3:20 PM  Clinical Narrative:    CSW received consult for SNF placement. CSW met with pt at bedside with daughter present. Pt refused SNF and wants to go home. Daughter reported that pt has caregivers in place. Pt has DME at home.   TOC will remain available should any additional needs arise.         Expected Discharge Plan and Services         Expected Discharge Date: 11/16/22                                     Social Determinants of Health (SDOH) Interventions SDOH Screenings   Food Insecurity: No Food Insecurity (11/12/2022)  Housing: Low Risk  (11/12/2022)  Transportation Needs: No Transportation Needs (11/12/2022)  Utilities: Not At Risk (11/12/2022)  Tobacco Use: Low Risk  (11/15/2022)    Readmission Risk Interventions     No data to display        Oletta Lamas, MSW, LCSWA, LCASA Transitions of Care  Clinical Social Worker I

## 2022-11-16 NOTE — Discharge Summary (Addendum)
Physician Discharge Summary  Tabitha Black:[id number] DOB: 06-Feb-1933 DOA: 11/12/2022  PCP: Irven Coe, MD  Admit date: 11/12/2022  Discharge date: 11/16/2022  Admitted From: Home  Disposition:  Home.  Recommendations for Outpatient Follow-up:  Follow up with PCP in 1-2 weeks. Please obtain BMP/CBC in one week. Advised to follow-up with gastroenterology as scheduled. Patient had EGD and colonoscopy,  advised to take pantoprazole 40 mg daily  Home Health:None Equipment/Devices:None  Discharge Condition: Good CODE STATUS:Full code Diet recommendation: Heart Healthy  Brief Summary/ Hospital Course: This 89 year old female with PMH significant for morbid obesity, bilateral lower extremity lymphedema, Chronic diastolic heart failure, CKD stage IV with baseline creatinine 2.9-3.1, chronic A-fib on Eliquis, hypertension, presented in the ED with GI bleeding for the last 5 days. Patient had a GI bleed in February 2024. She underwent EGD which demonstrated gastric ulcerations. Pathology was negative for H. pylori. She was restarted on her outpatient Eliquis. Patient states that she has been on her Eliquis this whole week. This is despite having GI bleeding that started on Monday. Patient reports feeling fatigued and weak.  She is found to have hemoglobin 6.4.  S/p 2 unit PRBC.  CT abdomen and pelvis demonstrated no acute bleeding.  Patient was admitted for further evaluation..  GI was consulted.  Patient underwent EGD found to have nonbleeding gastric ulcers, gastritis, duodenitis. Colonoscopy showed diverticulosis, no signs of bleeding.  PT and OT recommended SNF.   Patient wanted to be discharged home.  Home health services arranged.  Discharge Diagnoses:  Principal Problem:   GI bleeding Active Problems:   Cellulitis of right leg   Chronic diastolic CHF (congestive heart failure) (HCC)   Chronic kidney disease, stage 4 (severe) (HCC) -Baseline Scr 2.9-3.1   Gastroesophageal reflux  disease   Morbid obesity (HCC)   HTN (hypertension)   A-fib (HCC)   DM2 (diabetes mellitus, type 2) (HCC)   Lymphedema of both lower extremities - R > L  GI bleeding: Patient reports having black-colored stools in the setting of Eliquis use. She reports having bleeding for last 1 week, She continued to take Eliquis. Hemoglobin on arrival 6.4, transfused 2 unit PRBC. Hemoglobin s/p transfusion improved to 8.2.  [facility] is consulted.   Patient underwent EGD found to have nonbleeding gastric ulcer,  duodenitis gastritis. Patient underwent colonoscopy found to have diverticulosis, no active bleeding. Eliquis resumed. GI signed off.   Cellulitis of right leg Completed Keflex for right cellulitis.   Lymphedema of both lower extremities:  Chronic , stable.   Diabetes mellitus with hyperglycemia: Continue sliding scale. Carb modified diet.   Atrial fibrillation: Heart rate is well-controlled. Continue Toprol-XL Resume Eliquis.   Essential hypertension: Continue Toprol-XL.   AKI on chronic kidney disease stage IV: Serum creatinine slightly trended up to 3.24 Likely in the setting of GI bleed, continue IV fluid resuscitation. Avoid nephrotoxic medications.  Recheck am lab   Chronic diastolic CHF: Does not appear euvolemic.   Continue diuresis in between and after transfusion.    Discharge Instructions  Discharge Instructions     Call MD for:  difficulty breathing, headache or visual disturbances   Complete by: As directed    Call MD for:  persistant dizziness or light-headedness   Complete by: As directed    Call MD for:  persistant nausea and vomiting   Complete by: As directed    Diet - low sodium heart healthy   Complete by: As directed    Diet Carb Modified  Complete by: As directed    Discharge instructions   Complete by: As directed    Advised to follow-up with primary care physician in 1 week. Advised to follow-up with gastroenterology as  scheduled. Patient had EGD and colonoscopy advised to take pantoprazole 40 mg daily   Increase activity slowly   Complete by: As directed       Allergies as of 11/16/2022       Reactions   Other Swelling   Topical mycin, unsure of which one, caused opthalmic swelling Erythromycin    Penicillins Shortness Of Breath, Itching, Swelling, Rash   Lovastatin Other (See Comments)   Weakness and myalgias, maybe to statins?   Azithromycin    Patient doesn't remember having a reaction to this.   Clindamycin    Patient doesn't remember having a reaction to this.   Hydralazine    Other reaction(s): felt foggy   Lisinopril Other (See Comments)   Hyperkalemia   Neomycin Sulfate [neomycin] Other (See Comments)   Redness and burning in face   Niacin Hives   Facial swelling and redness   Rofecoxib Other (See Comments)   Unknown reaction   Tetracycline    Valsartan Other (See Comments)   Hyperkalemia   Vytorin [ezetimibe-simvastatin] Other (See Comments)   myalgias   Zetia [ezetimibe] Other (See Comments)   Leg pain        Medication List     STOP taking these medications    benzonatate 200 MG capsule Commonly known as: TESSALON   Fusion Plus Caps   metolazone 5 MG tablet Commonly known as: ZAROXOLYN       TAKE these medications    acetaminophen 325 MG tablet Commonly known as: TYLENOL Take 2 tablets (650 mg total) by mouth every 6 (six) hours as needed for mild pain (or Fever >/= 101).   apixaban 2.5 MG Tabs tablet Commonly known as: ELIQUIS Take 1 tablet (2.5 mg total) by mouth 2 (two) times daily.   cyclobenzaprine 10 MG tablet Commonly known as: FLEXERIL Take 5 mg by mouth daily as needed for muscle spasms.   doxazosin 4 MG tablet Commonly known as: CARDURA Take 4 mg by mouth daily.   fenofibrate 160 MG tablet Take 160 mg by mouth daily.   guaiFENesin 200 MG tablet Take 1 tablet (200 mg total) by mouth every 4 (four) hours as needed for cough or to  loosen phlegm.   Hair Skin and Nails Formula Tabs Take 1 tablet by mouth daily.   HYDROcodone-acetaminophen 10-325 MG tablet Commonly known as: NORCO Take 1 tablet by mouth at bedtime.   isosorbide mononitrate 30 MG 24 hr tablet Commonly known as: IMDUR Take 1/2 tablet (15 mg) by mouth at bedtime.   metoprolol succinate 50 MG 24 hr tablet Commonly known as: TOPROL-XL Take 1 tablet (50 mg total) by mouth every evening. Take with or immediately following a meal. What changed:  medication strength how much to take   Potassium Chloride ER 20 MEQ Tbcr Take 1 tablet by mouth daily.   potassium chloride SA 20 MEQ tablet Commonly known as: KLOR-CON M Take 1 tablet (20 mEq) by mouth daily.   Vitamin D3 125 MCG (5000 UT) Tabs Take 5,000 Units by mouth daily.        Follow-up Information     Irven Coe, MD Follow up.   Specialty: Family Medicine Why: The office will call patient. Contact information: [address] [employer] [address] [place] [address] [phone]  [facility] Follow up.   Specialty: Home Health Services Why: Agency will contact you to set up apt times Contact information: [address] [place] [address] [phone]                Allergies  Allergen Reactions   Other Swelling    Topical mycin, unsure of which one, caused opthalmic swelling  Erythromycin    Penicillins Shortness Of Breath, Itching, Swelling and Rash   Lovastatin Other (See Comments)    Weakness and myalgias, maybe to statins?   Azithromycin     Patient doesn't remember having a reaction to this.   Clindamycin     Patient doesn't remember having a reaction to this.    Hydralazine     Other reaction(s): felt foggy   Lisinopril Other (See Comments)    Hyperkalemia    Neomycin Sulfate [Neomycin] Other (See Comments)    Redness and burning in face   Niacin Hives    Facial swelling and redness   Rofecoxib Other (See  Comments)    Unknown reaction   Tetracycline    Valsartan Other (See Comments)    Hyperkalemia    Vytorin [Ezetimibe-Simvastatin] Other (See Comments)    myalgias   Zetia [Ezetimibe] Other (See Comments)    Leg pain    Consultations: Gastroenterology   Procedures/Studies: CT ABDOMEN PELVIS WO CONTRAST  Result Date: 11/12/2022 CLINICAL DATA:  Abdominal pain and dark stools for 1 week EXAM: CT ABDOMEN AND PELVIS WITHOUT CONTRAST TECHNIQUE: Multidetector CT imaging of the abdomen and pelvis was performed following the standard protocol without IV contrast. RADIATION DOSE REDUCTION: This exam was performed according to the departmental dose-optimization program which includes automated exposure control, adjustment of the mA and/or kV according to patient size and/or use of iterative reconstruction technique. COMPARISON:  03/31/2015 FINDINGS: Lower chest: Small right-sided pleural effusion is noted. Mild bibasilar atelectasis is seen. Hepatobiliary: No focal liver abnormality is seen. Status post cholecystectomy. No biliary dilatation. Pancreas: Pancreas is somewhat atrophic.  No focal mass is seen. Spleen: Normal in size without focal abnormality. Adrenals/Urinary Tract: Stable left adrenal adenoma is noted. The right kidney is somewhat ptotic in nature. Cystic changes are seen. No further follow-up is recommended. No obstructive changes are seen. The bladder is well distended without focal abnormality. Left lateral bladder diverticulum is noted Stomach/Bowel: Scattered diverticular change of the colon is noted without evidence of diverticulitis. Stomach is decompressed. Small bowel appears within normal limits. Vascular/Lymphatic: Aortic atherosclerosis. No enlarged abdominal or pelvic lymph nodes. Reproductive: Status post hysterectomy. No adnexal masses. Other: Laxity of the anterior abdominal wall is noted although no true herniation is seen. This lies in an area of prior umbilical hernia repair.  Additionally there is a peritoneal wall defect laterally on the right containing omental fat. No herniated bowel is noted within. This appears overall stable when compared with the prior exam. Musculoskeletal: Postsurgical and degenerative changes of lumbar spine are noted. IMPRESSION: No findings to correspond with the given clinical history of dark tarry stools. Stable right lateral peritoneal wall defect unchanged from prior exam. The previously seen umbilical hernia has been. Although significant laxity of the anterior abdominal wall is noted without definitive herniation. Diverticulosis without diverticulitis. Electronically Signed   By: Alcide Clever M.D.   On: 11/12/2022 19:37   DG Chest Port 1 View  Result Date: 11/12/2022 CLINICAL DATA:  Questionable sepsis. Evaluate for abnormality. Dark stools. Abnormal pain. EXAM: PORTABLE CHEST  1 VIEW COMPARISON:  One-view chest x-ray 3/17/4 FINDINGS: Cardiomegaly is stable. Edema or effusion is present. No focal airspace disease is present. Visualized soft tissues bony thorax are unremarkable. IMPRESSION: 1. Cardiomegaly without failure. 2. Edema or effusion. Electronically Signed   By: Marin Roberts M.D.   On: 11/12/2022 16:25   EGD/colonoscopy   Subjective: Patient was seen and examined at bedside.  Overnight events noted.   Patient reports doing much better.  She underwent EGD and colonoscopy.  Discharge Exam: Vitals:   11/16/22 0940 11/16/22 1048  BP: (!) 149/71 (!) 136/50  Pulse: 94 84  Resp: (!) 23 17  Temp:  (!) 97.5 F (36.4 C)  SpO2: 99% 95%   Vitals:   11/16/22 0920 11/16/22 0930 11/16/22 0940 11/16/22 1048  BP: (!) 110/48 (!) 141/76 (!) 149/71 (!) 136/50  Pulse: 88 94 94 84  Resp: (!) 21 (!) 21 (!) 23 17  Temp:    (!) 97.5 F (36.4 C)  TempSrc:    Oral  SpO2: 95% 97% 99% 95%  Weight:      Height:        General: Pt is alert, awake, not in acute distress Cardiovascular: RRR, S1/S2 +, no rubs, no gallops Respiratory:  CTA bilaterally, no wheezing, no rhonchi Abdominal: Soft, NT, ND, bowel sounds + Extremities: no edema, no cyanosis    The results of significant diagnostics from this hospitalization (including imaging, microbiology, ancillary and laboratory) are listed below for reference.     Microbiology: Recent Results (from the past 240 hour(s))  Blood Culture (routine x 2)     Status: None (Preliminary result)   Collection Time: 11/12/22  4:31 PM   Specimen: BLOOD  Result Value Ref Range Status   Specimen Description BLOOD SITE NOT SPECIFIED  Final   Special Requests   Final    BOTTLES DRAWN AEROBIC AND ANAEROBIC Blood Culture results may not be optimal due to an inadequate volume of blood received in culture bottles   Culture   Final    NO GROWTH 4 DAYS Performed at [facility], [address] [place], [place] [address]    Report Status PENDING  Incomplete  Blood Culture (routine x 2)     Status: None (Preliminary result)   Collection Time: 11/12/22  5:20 PM   Specimen: BLOOD RIGHT ARM  Result Value Ref Range Status   Specimen Description BLOOD RIGHT ARM  Final   Special Requests   Final    BOTTLES DRAWN AEROBIC AND ANAEROBIC Blood Culture adequate volume   Culture   Final    NO GROWTH 4 DAYS Performed at [facility], [address], [place], [place] [address]    Report Status PENDING  Incomplete     Labs: BNP (last 3 results) Recent Labs    10/03/22 2242  BNP 472.6*   Basic Metabolic Panel: Recent Labs  Lab 11/12/22 1631 11/13/22 0422 11/14/22 0050 11/16/22 0033  NA 135 135 136 134*  K 3.8 3.2* 3.6 3.1*  CL 101 102 104 104  CO2 20* 20* 20* 21*  GLUCOSE 106* 105* 112* 108*  BUN 108* 99* 91* 69*  CREATININE 3.21* 3.19* 3.26* 2.81*  CALCIUM 8.8* 8.6* 8.9 8.8*  MG  --  1.7 2.3 2.1  PHOS  --   --  4.0 3.5   Liver Function Tests: Recent Labs  Lab 11/12/22 1631 11/13/22 0422  AST 38 30  ALT 14 15  ALKPHOS 26* 26*  BILITOT  1.3* 1.7*  PROT 5.8*  5.3*  ALBUMIN 2.7* 2.4*   No results for input(s): "LIPASE", "AMYLASE" in the last 168 hours. No results for input(s): "AMMONIA" in the last 168 hours. CBC: Recent Labs  Lab 11/12/22 1631 11/13/22 0422 11/14/22 0050 11/16/22 0033 11/16/22 1256  WBC 10.3 10.2 9.5 4.9  --   NEUTROABS 9.2* 8.9*  --   --   --   HGB 6.4* 8.3* 8.7* 8.7* 9.7*  HCT 21.3* 26.2* 27.0* 26.4* 29.1*  MCV 100.5* 93.6 94.1 93.0  --   PLT 166 161 173 180  --    Cardiac Enzymes: No results for input(s): "CKTOTAL", "CKMB", "CKMBINDEX", "TROPONINI" in the last 168 hours. BNP: Invalid input(s): "POCBNP" CBG: Recent Labs  Lab 11/15/22 1103 11/15/22 1629 11/15/22 2120 11/16/22 0643 11/16/22 1052  GLUCAP 91 135* 138* 106* 139*   D-Dimer No results for input(s): "DDIMER" in the last 72 hours. Hgb A1c No results for input(s): "HGBA1C" in the last 72 hours. Lipid Profile No results for input(s): "CHOL", "HDL", "LDLCALC", "TRIG", "CHOLHDL", "LDLDIRECT" in the last 72 hours. Thyroid function studies No results for input(s): "TSH", "T4TOTAL", "T3FREE", "THYROIDAB" in the last 72 hours.  Invalid input(s): "FREET3" Anemia work up No results for input(s): "VITAMINB12", "FOLATE", "FERRITIN", "TIBC", "IRON", "RETICCTPCT" in the last 72 hours. Urinalysis    Component Value Date/Time   COLORURINE YELLOW 11/12/2022 2357   APPEARANCEUR CLEAR 11/12/2022 2357   LABSPEC 1.010 11/12/2022 2357   PHURINE 5.0 11/12/2022 2357   GLUCOSEU NEGATIVE 11/12/2022 2357   HGBUR NEGATIVE 11/12/2022 2357   BILIRUBINUR NEGATIVE 11/12/2022 2357   KETONESUR NEGATIVE 11/12/2022 2357   PROTEINUR NEGATIVE 11/12/2022 2357   UROBILINOGEN 1.0 04/01/2015 0425   NITRITE NEGATIVE 11/12/2022 2357   LEUKOCYTESUR NEGATIVE 11/12/2022 2357   Sepsis Labs Recent Labs  Lab 11/12/22 1631 11/13/22 0422 11/14/22 0050 11/16/22 0033  WBC 10.3 10.2 9.5 4.9   Microbiology Recent Results (from the past 240 hour(s))  Blood Culture (routine x 2)      Status: None (Preliminary result)   Collection Time: 11/12/22  4:31 PM   Specimen: BLOOD  Result Value Ref Range Status   Specimen Description BLOOD SITE NOT SPECIFIED  Final   Special Requests   Final    BOTTLES DRAWN AEROBIC AND ANAEROBIC Blood Culture results may not be optimal due to an inadequate volume of blood received in culture bottles   Culture   Final    NO GROWTH 4 DAYS Performed at [facility], [address], [place], [place] [address]    Report Status PENDING  Incomplete  Blood Culture (routine x 2)     Status: None (Preliminary result)   Collection Time: 11/12/22  5:20 PM   Specimen: BLOOD RIGHT ARM  Result Value Ref Range Status   Specimen Description BLOOD RIGHT ARM  Final   Special Requests   Final    BOTTLES DRAWN AEROBIC AND ANAEROBIC Blood Culture adequate volume   Culture   Final    NO GROWTH 4 DAYS Performed at [facility], [address] [place], [place] [address]    Report Status PENDING  Incomplete     Time coordinating discharge: Over 30 minutes  SIGNED:   Willeen Niece, MD  [facility] 11/16/2022, 3:54 PM Pager

## 2022-11-16 NOTE — TOC Transition Note (Signed)
Transition of Care [facility]) - CM/SW Discharge Note   Patient Details  Name: Tabitha Black MRN: [id number] Date of Birth: June 30, 1933  Transition of Care [facility]) CM/SW Contact:  Leone Haven, RN Phone Number: 11/16/2022, 3:32 PM   Clinical Narrative:    For dc today, daughter will transport home and she is set up with [facility].   Final next level of care: Home w Home Health Services Barriers to Discharge: No Barriers Identified   Patient Goals and CMS Choice CMS Medicare.gov Compare Post Acute Care list provided to:: Patient Represenative (must comment) Choice offered to / list presented to : Adult Children  Discharge Placement                         Discharge Plan and Services Additional resources added to the After Visit Summary for   In-house Referral: NA Discharge Planning Services: CM Consult Post Acute Care Choice: Resumption of Svcs/PTA Provider, Home Health          DME Arranged: N/A DME Agency: NA       HH Arranged: RN, PT, OT HH Agency: [facility] Date HH Agency Contacted: 11/16/22 Time HH Agency Contacted: 1528 Representative spoke with at [facility] Agency: Ephriam Knuckles  Social Determinants of Health (SDOH) Interventions SDOH Screenings   Food Insecurity: No Food Insecurity (11/12/2022)  Housing: Low Risk  (11/12/2022)  Transportation Needs: No Transportation Needs (11/12/2022)  Utilities: Not At Risk (11/12/2022)  Tobacco Use: Low Risk  (11/15/2022)     Readmission Risk Interventions    11/16/2022    3:25 PM  Readmission Risk Prevention Plan  Transportation Screening Complete  PCP or Specialist Appt within 3-5 Days Complete  HRI or Home Care Consult Complete  Palliative Care Screening Not Applicable  Medication Review (RN Care Manager) Complete

## 2022-11-16 NOTE — Evaluation (Addendum)
Physical Therapy Evaluation Patient Details Name: Tabitha Black MRN: [id number] DOB: [birth year] Today's Date: 11/16/2022  History of Present Illness  Pt is an 89 y/o F presenting to ED on 4/26 with weakness, dark stools, admitted for GI bleed. PMH includes BLE lymphedema, chronic diastolic heart failure, CKD IV, morbid obesity, A fib on Eliquis, HTN, recent admission 08/2022 for GI bleed.  Clinical Impression  Pt presents with admitting diagnosis above. Co treat with OT. Today pt required +2 Mod A with Bed mobility and +2 Min A for sit to stand however once up pt was able to ambulate short distance around room with RW at min guard level. At baseline, Pt currently lives alone and sleeps in a lift chair however pt daughter was present during session and states that she will stay for the rest of the week to provide assistance. Pt sister currently provides assistance as she lives next door but will be out of town until Saturday. Despite this, pt states that she does not feel comfortable returning home and would like SNF. Recommend SNF at DC to decrease caregiver and increase functional independence. PT will continue to follow.      Recommendations for follow up therapy are one component of a multi-disciplinary discharge planning process, led by the attending physician.  Recommendations may be updated based on patient status, additional functional criteria and insurance authorization.  Follow Up Recommendations Can patient physically be transported by private vehicle: No     Assistance Recommended at Discharge Frequent or constant Supervision/Assistance  Patient can return home with the following  A lot of help with walking and/or transfers;A lot of help with bathing/dressing/bathroom;Assistance with cooking/housework;Direct supervision/assist for medications management;Assist for transportation;Help with stairs or ramp for entrance    Equipment Recommendations None recommended by PT (Pt has needed  DME)  Recommendations for Other Services       Functional Status Assessment Patient has had a recent decline in their functional status and demonstrates the ability to make significant improvements in function in a reasonable and predictable amount of time.     Precautions / Restrictions Precautions Precautions: Fall Precaution Comments: Pt has BLE lymphedema. Has socks and shoes in room. Restrictions Weight Bearing Restrictions: No      Mobility  Bed Mobility Overal bed mobility: Needs Assistance Bed Mobility: Supine to Sit     Supine to sit: Mod assist, +2 for physical assistance          Transfers Overall transfer level: Needs assistance Equipment used: Rolling walker (2 wheels) Transfers: Sit to/from Stand Sit to Stand: Min assist, +2 physical assistance           General transfer comment: reports feeling weak    Ambulation/Gait Ambulation/Gait assistance: Min guard, +2 safety/equipment Gait Distance (Feet): 25 Feet Assistive device: Rolling walker (2 wheels) Gait Pattern/deviations: Decreased stride length, Step-through pattern (R LE externally rotated) Gait velocity: decreased     General Gait Details: Slow steady gait. No LOB noted.  Stairs            Wheelchair Mobility    Modified Rankin (Stroke Patients Only)       Balance Overall balance assessment: Mild deficits observed, not formally tested                                           Pertinent Vitals/Pain Pain Assessment Pain Assessment: No/denies pain  Home Living Family/patient expects to be discharged to:: Private residence Living Arrangements: Alone Available Help at Discharge: Family;Available PRN/intermittently Type of Home: House Home Access: Ramped entrance       Home Layout: One level Home Equipment: Toilet riser;Shower seat;Grab bars - toilet;Grab bars - tub/shower;Other (comment);Rolling Walker (2 wheels) Additional Comments: Sleeps in lift  chair    Prior Function Prior Level of Function : Needs assist             Mobility Comments: uses RW ADLs Comments: ind; family assists with transportation     Hand Dominance   Dominant Hand: Right    Extremity/Trunk Assessment   Upper Extremity Assessment Upper Extremity Assessment: Defer to OT evaluation    Lower Extremity Assessment Lower Extremity Assessment: Overall WFL for tasks assessed    Cervical / Trunk Assessment Cervical / Trunk Assessment: Kyphotic  Communication   Communication: HOH  Cognition Arousal/Alertness: Awake/alert Behavior During Therapy: Anxious, Restless Overall Cognitive Status: Within Functional Limits for tasks assessed                                          General Comments General comments (skin integrity, edema, etc.): VSS on RA    Exercises     Assessment/Plan    PT Assessment Patient needs continued PT services  PT Problem List Decreased strength;Decreased range of motion;Decreased activity tolerance;Decreased balance;Decreased mobility;Decreased knowledge of use of DME;Decreased safety awareness;Cardiopulmonary status limiting activity       PT Treatment Interventions DME instruction;Gait training;Functional mobility training;Therapeutic activities;Therapeutic exercise;Balance training;Neuromuscular re-education;Patient/family education    PT Goals (Current goals can be found in the Care Plan section)  Acute Rehab PT Goals Patient Stated Goal: to go home PT Goal Formulation: With patient/family Time For Goal Achievement: 11/30/22 Potential to Achieve Goals: Fair    Frequency Min 1X/week     Co-evaluation PT/OT/SLP Co-Evaluation/Treatment: Yes Reason for Co-Treatment: For patient/therapist safety;To address functional/ADL transfers;Other (comment) (Imminent DC) PT goals addressed during session: Mobility/safety with mobility;Proper use of DME OT goals addressed during session: ADL's and  self-care       AM-PAC PT "6 Clicks" Mobility  Outcome Measure Help needed turning from your back to your side while in a flat bed without using bedrails?: A Lot Help needed moving from lying on your back to sitting on the side of a flat bed without using bedrails?: A Lot Help needed moving to and from a bed to a chair (including a wheelchair)?: A Lot Help needed standing up from a chair using your arms (e.g., wheelchair or bedside chair)?: A Lot Help needed to walk in hospital room?: A Little Help needed climbing 3-5 steps with a railing? : A Lot 6 Click Score: 13    End of Session Equipment Utilized During Treatment: Gait belt Activity Tolerance: Patient tolerated treatment well Patient left: in chair;with call bell/phone within reach;with family/visitor present (Daughter present) Nurse Communication: Mobility status PT Visit Diagnosis: Other abnormalities of gait and mobility (R26.89)    Time: 1610-9604 PT Time Calculation (min) (ACUTE ONLY): 23 min   Charges:   PT Evaluation $PT Eval Low Complexity: 1 Low PT Treatments $Gait Training: 8-22 mins        Shela Nevin, PT, DPT Acute Rehab Services [phone]   [name] 11/16/2022, 1:04 PM

## 2022-11-16 NOTE — Transfer of Care (Signed)
Immediate Anesthesia Transfer of Care Note  Patient: Tabitha Black  Procedure(s) Performed: COLONOSCOPY  Patient Location: Endoscopy Unit  Anesthesia Type:MAC  Level of Consciousness: drowsy and patient cooperative  Airway & Oxygen Therapy: Patient Spontanous Breathing  Post-op Assessment: Report given to RN, Post -op Vital signs reviewed and stable, and Patient moving all extremities X 4  Post vital signs: Reviewed and stable  Last Vitals:  Vitals Value Taken Time  BP    Temp    Pulse 84 11/16/22 0916  Resp 19 11/16/22 0916  SpO2 98 % 11/16/22 0916  Vitals shown include unvalidated device data.  Last Pain:  Vitals:   11/16/22 0822  TempSrc: Tympanic  PainSc: 0-No pain      Patients Stated Pain Goal: 0 (11/15/22 1536)  Complications: No notable events documented.

## 2022-11-16 NOTE — Progress Notes (Signed)
PT Cancellation Note  Patient Details Name: Tabitha Black MRN: [id number] DOB: 1933-03-13   Cancelled Treatment:    Reason Eval/Treat Not Completed: Patient at procedure or test/unavailable (Pt off the floor. Will try again later if time permits)   [name] 11/16/2022, 8:26 AM

## 2022-11-16 NOTE — Plan of Care (Signed)

## 2022-11-16 NOTE — Interval H&P Note (Signed)
History and Physical Interval Note:  11/16/2022 8:29 AM  Tabitha Black  has presented today for surgery, with the diagnosis of Anemia, GIB.  The various methods of treatment have been discussed with the patient and family. After consideration of risks, benefits and other options for treatment, the patient has consented to  Procedure(s): COLONOSCOPY (N/A) as a surgical intervention.  The patient's history has been reviewed, patient examined, no change in status, stable for surgery.  I have reviewed the patient's chart and labs.  Questions were answered to the patient's satisfaction.     Lynann Bologna

## 2022-11-16 NOTE — Brief Op Note (Signed)
11/16/2022  9:10 AM  PATIENT:  Tabitha Black  89 y.o. female  PRE-OPERATIVE DIAGNOSIS:  Anemia, GIB  POST-OPERATIVE DIAGNOSIS:  diverticulosis, no signs of active GI blood loss  PROCEDURE:  Procedure(s): COLONOSCOPY (N/A)  SURGEON:  Surgeon(s) and Role:    Lynann Bologna, DO - Primary  Recommendations:  -Continue PPI therapy -Trend H/H -Ok to resume anticoagulant from GI perspective -Monitor stool output -Recommend outpatient video capsule endoscopy with [facility] -[facility] team will respectfully sign off and be available for any questions that arise  Liliane Shi, DO [facility]

## 2022-11-16 NOTE — Discharge Instructions (Signed)
Advised to follow-up with primary care physician in 1 week. Advised to follow-up with gastroenterology as scheduled. Patient had EGD and colonoscopy advised to take pantoprazole 40 mg daily

## 2022-11-16 NOTE — TOC Initial Note (Signed)
Transition of Care [facility]) - Initial/Assessment Note    Patient Details  Name: Tabitha Black MRN: [id number] Date of Birth: 01-Nov-1932  Transition of Care [facility]) CM/SW Contact:    Leone Haven, RN Phone Number: 11/16/2022, 3:29 PM  Clinical Narrative:                 From home alone, daughter is at the bedside, patient has all the DME she needs.  Daughter and patient does not want to go to SNF they would like to go home with [facility] per MD and CSW.  She is active with [facility] for [facility], HHPT, HHOT , they would like to continue with [facility] per daughter.  Daughte will stay with patient thru Sunday.  She will transport her home today. NCM confirmed with Christian with [facility] and notified him of dc today.  Expected Discharge Plan: Home w Home Health Services Barriers to Discharge: No Barriers Identified   Patient Goals and CMS Choice Patient states their goals for this hospitalization and ongoing recovery are:: return home with daughter CMS Medicare.gov Compare Post Acute Care list provided to:: Patient Represenative (must comment) Choice offered to / list presented to : Adult Children      Expected Discharge Plan and Services In-house Referral: NA Discharge Planning Services: CM Consult Post Acute Care Choice: Resumption of Svcs/PTA Provider, Home Health Living arrangements for the past 2 months: Single Family Home Expected Discharge Date: 11/16/22               DME Arranged: N/A DME Agency: NA       HH Arranged: RN, PT, OT HH Agency: [facility] Date HH Agency Contacted: 11/16/22 Time HH Agency Contacted: 1528 Representative spoke with at [facility] Agency: Christian  Prior Living Arrangements/Services Living arrangements for the past 2 months: Single Family Home Lives with:: Self (daughter will be with her til Sunday) Patient language and need for interpreter reviewed:: Yes Do you feel safe going back to the place where you live?: Yes      Need for Family  Participation in Patient Care: Yes (Comment) Care giver support system in place?: Yes (comment) Current home services: DME (all DME she needs. ramp, grab bars, walk in shower, lift chair) Criminal Activity/Legal Involvement Pertinent to Current Situation/Hospitalization: No - Comment as needed  Activities of Daily Living Home Assistive Devices/Equipment: Environmental consultant (specify type), Shower chair without back, Eyeglasses ADL Screening (condition at time of admission) Patient's cognitive ability adequate to safely complete daily activities?: Yes Is the patient deaf or have difficulty hearing?: Yes Does the patient have difficulty seeing, even when wearing glasses/contacts?: No Does the patient have difficulty concentrating, remembering, or making decisions?: No Patient able to express need for assistance with ADLs?: Yes Does the patient have difficulty dressing or bathing?: No Independently performs ADLs?: Yes (appropriate for developmental age) Does the patient have difficulty walking or climbing stairs?: Yes Weakness of Legs: Both Weakness of Arms/Hands: None  Permission Sought/Granted                  Emotional Assessment       Orientation: : Oriented to Self, Oriented to Place, Oriented to  Time, Oriented to Situation Alcohol / Substance Use: Not Applicable Psych Involvement: No (comment)  Admission diagnosis:  GI bleeding [K92.2] Upper GI bleeding [K92.2] Symptomatic anemia [D64.9] Cellulitis of right lower extremity without foot [L03.115] Sepsis without acute organ dysfunction, due to unspecified organism [facility]) [A41.9] Patient Active Problem List   Diagnosis Date  Noted   GI bleeding 11/12/2022   Cellulitis of right leg 11/12/2022   Lymphedema of both lower extremities - R > L 10/04/2022   HTN (hypertension) 08/23/2022   A-fib (HCC) 08/23/2022   DM2 (diabetes mellitus, type 2) (HCC) 08/23/2022   Pain in joint of left knee 01/06/2021   Adverse effect of antihyperlipidemic  and antiarteriosclerotic drugs, initial encounter 08/22/2020   Age-related physical debility 08/22/2020   Chronic diastolic CHF (congestive heart failure) (HCC) 08/22/2020   Chronic kidney disease, stage 4 (severe) (HCC) -Baseline Scr 2.9-3.1 08/22/2020   Diabetic renal disease (HCC) 08/22/2020   Gastroesophageal reflux disease 08/22/2020   History of malignant neoplasm of skin 08/22/2020   Hypertensive heart and chronic kidney disease with heart failure and stage 1 through stage 4 chronic kidney disease, or unspecified chronic kidney disease (HCC) 08/22/2020   Iron deficiency anemia 08/22/2020   Mixed hyperlipidemia 08/22/2020   Morbid obesity (HCC) 08/22/2020   Osteoarthritis 08/22/2020   Personal history of colonic polyps 08/22/2020   Skin ulcer (HCC) 08/22/2020   Cervical spondylosis with radiculopathy 07/26/2017   PCP:  Irven Coe, MD Pharmacy:   [facility] [phone] G[place] Otto, [place] - [address] [place] [address] Phone: [phone] Fax: [phone]   - [facility] [address] [place] [address] Phone: [phone] Fax: [phone]     Social Determinants of Health (SDOH) Social History: SDOH Screenings   Food Insecurity: No Food Insecurity (11/12/2022)  Housing: Low Risk  (11/12/2022)  Transportation Needs: No Transportation Needs (11/12/2022)  Utilities: Not At Risk (11/12/2022)  Tobacco Use: Low Risk  (11/15/2022)   SDOH Interventions:     Readmission Risk Interventions    11/16/2022    3:25 PM  Readmission Risk Prevention Plan  Transportation Screening Complete  PCP or Specialist Appt within 3-5 Days Complete  HRI or Home Care Consult Complete  Palliative Care Screening Not Applicable  Medication Review (RN Care Manager) Complete

## 2022-11-16 NOTE — Op Note (Signed)
[facility] Patient Name: Tabitha Black Procedure Date : 11/16/2022 MRN: [id number] Attending MD: Liliane Shi DO, DO, [id number] Date of Birth: 06/26/1933 CSN: [id number] Age: 89 Admit Type: Inpatient Procedure:                Colonoscopy Indications:              Melena, Acute post hemorrhagic anemia Providers:                Liliane Shi DO, DO, Dartha Lodge, RN, Rozetta Nunnery, Technician Referring MD:              Medicines:                See the Anesthesia note for documentation of the                            administered medications Complications:             Estimated Blood Loss:     Estimated blood loss: none. Estimated blood loss:                            none. Procedure:                Pre-Anesthesia Assessment:                           - ASA Grade Assessment: III - A patient with severe                            systemic disease.                           - The risks and benefits of the procedure and the                            sedation options and risks were discussed with the                            patient. All questions were answered and informed                            consent was obtained.                           After obtaining informed consent, the colonoscope                            was passed under direct vision. Throughout the                            procedure, the patient's blood pressure, pulse, and                            oxygen saturations were monitored continuously. The  PCF-HQ190TL ([id number] Olympus peds colonoscope was                            introduced through the anus and advanced to the the                            cecum, identified by appendiceal orifice and                            ileocecal valve. The colonoscopy was performed                            without difficulty. The patient tolerated the                            procedure well.  The quality of the bowel                            preparation was evaluated using the BBPS [facility]                            Bowel Preparation Scale) with scores of: Right                            Colon = 2 (minor amount of residual staining, small                            fragments of stool and/or opaque liquid, but mucosa                            seen well), Transverse Colon = 3 (entire mucosa                            seen well with no residual staining, small                            fragments of stool or opaque liquid) and Left Colon                            = 2 (minor amount of residual staining, small                            fragments of stool and/or opaque liquid, but mucosa                            seen well). The total BBPS score equals 7. The                            quality of the bowel preparation was good. Scope In: 8:44:55 AM Scope Out: 9:08:09 AM Scope Withdrawal Time: 0 hours 11 minutes 54 seconds  Total Procedure Duration: 0 hours 23 minutes 14 seconds  Findings:      The digital rectal exam findings include  non-thrombosed external       hemorrhoids.      A few small-mouthed diverticula were found in the sigmoid colon.      An area of mildly congested mucosa was found in the ascending colon and       transverse colon suspected secondary to bowel preparation      No signs of active GI blood loss on colonoscopy. Impression:               - Non-thrombosed external hemorrhoids found on                            digital rectal exam.                           - Diverticulosis in the sigmoid colon.                           - No specimens collected. Recommendation:           - Return patient to hospital ward for ongoing care.                           - Resume previous diet.                           - Continue present medications.                           - Check hemoglobin daily.                           - Recommend outpatient video capsule  endoscopy with                            [facility] office.                           - Resume Eliquis (apixaban) at prior dose today. Procedure Code(s):        --- Professional ---                           [id number], Colonoscopy, flexible; diagnostic, including                            collection of specimen(s) by brushing or washing,                            when performed (separate procedure) Diagnosis Code(s):        --- Professional ---                           K64.4, Residual hemorrhoidal skin tags                           K92.1, Melena (includes Hematochezia)                           D62, Acute posthemorrhagic  anemia                           K57.30, Diverticulosis of large intestine without                            perforation or abscess without bleeding CPT copyright 2022 American Medical Association. All rights reserved. The codes documented in this report are preliminary and upon coder review may  be revised to meet current compliance requirements. Dr Liliane Shi, DO Liliane Shi DO, DO 11/16/2022 9:21:05 AM Number of Addenda: 0

## 2022-11-16 NOTE — Anesthesia Preprocedure Evaluation (Signed)
Anesthesia Evaluation  Patient identified by MRN, date of birth, ID band Patient awake    Reviewed: Allergy & Precautions, NPO status , Patient's Chart, lab work & pertinent test results, reviewed documented beta blocker date and time   Airway Mallampati: II  TM Distance: >3 FB Neck ROM: Full    Dental  (+) Teeth Intact, Dental Advisory Given   Pulmonary neg pulmonary ROS   Pulmonary exam normal breath sounds clear to auscultation       Cardiovascular hypertension, Pt. on home beta blockers +CHF  + dysrhythmias Atrial Fibrillation  Rhythm:Irregular Rate:Abnormal     Neuro/Psych  Neuromuscular disease    GI/Hepatic Neg liver ROS,GERD  ,,GIB   Endo/Other  diabetes    Renal/GU Renal Insufficiency and CRFRenal disease     Musculoskeletal  (+) Arthritis ,    Abdominal   Peds  Hematology  (+) Blood dyscrasia (Eliquis), anemia   Anesthesia Other Findings Day of surgery medications reviewed with the patient.  Reproductive/Obstetrics                             Anesthesia Physical Anesthesia Plan  ASA: 3  Anesthesia Plan: MAC   Post-op Pain Management:    Induction: Intravenous  PONV Risk Score and Plan: 2 and TIVA and Treatment may vary due to age or medical condition  Airway Management Planned: Natural Airway and Simple Face Mask  Additional Equipment:   Intra-op Plan:   Post-operative Plan:   Informed Consent: I have reviewed the patients History and Physical, chart, labs and discussed the procedure including the risks, benefits and alternatives for the proposed anesthesia with the patient or authorized representative who has indicated his/her understanding and acceptance.     Dental advisory given  Plan Discussed with: CRNA and Anesthesiologist  Anesthesia Plan Comments:        Anesthesia Quick Evaluation  

## 2022-11-17 ENCOUNTER — Ambulatory Visit: Payer: Medicare Other | Admitting: Podiatry

## 2022-11-17 LAB — CULTURE, BLOOD (ROUTINE X 2): Special Requests: ADEQUATE

## 2022-11-18 ENCOUNTER — Encounter (HOSPITAL_COMMUNITY): Payer: Self-pay | Admitting: Internal Medicine

## 2022-11-18 DIAGNOSIS — D62 Acute posthemorrhagic anemia: Secondary | ICD-10-CM | POA: Diagnosis not present

## 2022-11-18 DIAGNOSIS — I5032 Chronic diastolic (congestive) heart failure: Secondary | ICD-10-CM | POA: Diagnosis not present

## 2022-11-18 DIAGNOSIS — I130 Hypertensive heart and chronic kidney disease with heart failure and stage 1 through stage 4 chronic kidney disease, or unspecified chronic kidney disease: Secondary | ICD-10-CM | POA: Diagnosis not present

## 2022-11-18 DIAGNOSIS — N184 Chronic kidney disease, stage 4 (severe): Secondary | ICD-10-CM | POA: Diagnosis not present

## 2022-11-18 DIAGNOSIS — D631 Anemia in chronic kidney disease: Secondary | ICD-10-CM | POA: Diagnosis not present

## 2022-11-18 DIAGNOSIS — E1122 Type 2 diabetes mellitus with diabetic chronic kidney disease: Secondary | ICD-10-CM | POA: Diagnosis not present

## 2022-11-19 DIAGNOSIS — K922 Gastrointestinal hemorrhage, unspecified: Secondary | ICD-10-CM | POA: Diagnosis not present

## 2022-11-19 DIAGNOSIS — K219 Gastro-esophageal reflux disease without esophagitis: Secondary | ICD-10-CM | POA: Diagnosis not present

## 2022-11-19 DIAGNOSIS — I890 Lymphedema, not elsewhere classified: Secondary | ICD-10-CM | POA: Diagnosis not present

## 2022-11-19 DIAGNOSIS — Z8719 Personal history of other diseases of the digestive system: Secondary | ICD-10-CM | POA: Diagnosis not present

## 2022-11-19 DIAGNOSIS — I4891 Unspecified atrial fibrillation: Secondary | ICD-10-CM | POA: Diagnosis not present

## 2022-11-19 DIAGNOSIS — I5032 Chronic diastolic (congestive) heart failure: Secondary | ICD-10-CM | POA: Diagnosis not present

## 2022-11-20 LAB — LAB REPORT - SCANNED: EGFR: 15

## 2022-11-21 NOTE — Progress Notes (Unsigned)
Cardiology Office Note:    Date:  11/22/2022  ID:  [name], DOB Oct 02, 1932, MRN [id number] PCP: Irven Coe, MD   HeartCare Providers Cardiologist:  Charlton Haws, MD          Patient Profile:   Apical variant HCM TTE 11/18/2016: EF 60-65, no RWMA, GR 2 DD, trivial MR, moderate LAE, normal RVSF, PASP 48, asymmetric LVH of LV apex (spade shaped ventricle) Persistent atrial fibrillation  (HFpEF) heart failure with preserved ejection fraction  Hypertension   ? Creatinine with ACE/ARB; intolerant of hydralazine Hyperlipidemia  Diabetes mellitus  Chronic kidney disease  Morbid obesity  Anemia  Lower ext edema; Lymphedema  Myoview 12/28/2016: No ischemia or infarction, EF 65, low risk History of upper GI bleed (08/2022; 10/2022) >> requiring PRBCs      History of Present Illness:   Tabitha Black is a 89 y.o. female who returns for follow-up on atrial fibrillation and apical variant hypertrophic cardiomyopathy.  She was last seen by Dr. Eden Emms 06/08/2022.  She was admitted in February 2024 with acute upper GI bleed requiring transfusion with 1 unit PRBCs.  She was noted to have gastritis on EGD.  GI cleared her to resume Eliquis.  She was also admitted with RSV in March 2024.  She was most recently admitted 4/26-4/30 with recurrent upper GI bleed.  She underwent transfusion with 2 units PRBCs.  EGD demonstrated nonbleeding gastric ulcer, duodenitis, gastritis.  Colonoscopy demonstrated diverticulosis without active bleeding.  GI cleared her to resume Eliquis. She is here today with her daughter. She has an appt with GI (Dr. Lorenso Quarry at [facility]) later this week for a capsule endo. She had labs with primary care 11/19/22. She has not had chest pain, shortness of breath, syncope. She has significant LE edema, mainly from lymphedema. This was much worse when she was admitted. This is better. She sleeps on an incline.   Review of Systems  Gastrointestinal:  Negative for hematochezia and melena.   Genitourinary:  Negative for hematuria.   See HPI    Studies Reviewed:    EKG:  AFib, HR 104, normal axis, TW inversions 1, aVL, QTc 433, no change from prior EKG  Risk Assessment/Calculations:    CHA2DS2-VASc Score = 6   This indicates a 9.7% annual risk of stroke. The patient's score is based upon: CHF History: 0 HTN History: 1 Diabetes History: 1 Stroke History: 0 Vascular Disease History: 1 Age Score: 2 Gender Score: 1            Physical Exam:   VS:  BP (!) 108/40   Pulse (!) 104   Ht 5\' 3"  (1.6 m)   Wt 156 lb (70.8 kg)   SpO2 98%   BMI 27.63 kg/m    Wt Readings from Last 3 Encounters:  11/22/22 156 lb (70.8 kg)  11/16/22 164 lb 7.4 oz (74.6 kg)  10/06/22 173 lb 11.6 oz (78.8 kg)    Constitutional:      Appearance: Not in distress.  Neck:     Vascular: No JVR.  Pulmonary:     Breath sounds: Normal breath sounds. No wheezing. No rales.  Cardiovascular:     Normal rate. HR 96 by ausc Irregularly irregular rhythm.     Murmurs: There is no murmur.  Edema:    Peripheral edema present.    Pretibial: bilateral 4+ edema of the pretibial area.    Ankle: bilateral 4+ edema of the ankle. Abdominal:     Palpations: Abdomen  is soft.       ASSESSMENT AND PLAN:   Persistent atrial fibrillation (HCC) She remains in atrial fibrillation. She has fair control of her HR. Her BP is soft. I do not think we need to adjust her rate controlling medications any further. She has had 2 GI bleeds since starting on Eliquis in 05/2022. She required transfusions with PRBCs both times. She is likely too high risk to continue anticoagulation long term. CHA2DS2-VASc Score = 6 [CHF History: 0, HTN History: 1, Diabetes History: 1, Stroke History: 0, Vascular Disease History: 1, Age Score: 2, Gender Score: 1].  Therefore, the patient's annual risk of stroke is 9.7 %. HAS-BLED score is 4 indicating a Yearly Major Bleeding Risk of 8.7%. I reviewed her case with Dr. Eden Emms who agreed we should  refer her to Dr. Lalla Brothers for consideration of LAAOD (Watchman).  Continue Eliquis 2.5 mg twice daily Continue Metoprolol succinate 50 mg once daily  Refer to Dr. Lalla Brothers for consideration of LAAOD F/u with Dr. Eden Emms in 3 mos  (HFpEF) heart failure with preserved ejection fraction [facility]) She has massive LE edema. This is mainly lymphedema. However she also has a component of (HFpEF) heart failure with preserved ejection fraction. Her nephrologist (Dr. Allena Katz) has recently placed her on Metolazone 5 mg once daily for 30 days in addition to her Lasix 80 mg twice daily. She is also getting set up for pneumatic compression devices at home. Continue f/u with Nephrology for management of diuretics. I will request recent labs from primary care.   Chronic kidney disease, stage 4 (severe) (HCC) -Baseline Scr 2.9-3.1 F/u with nephrology as planned.   HTN (hypertension) BP is controlled on current Rx. Continue Doxazosin 4 mg once daily, Imdur 15 mg once daily, Metoprolol succinate 50 mg once daily.      Dispo:  Return in about 3 months (around 02/22/2023) for Routine Follow Up  w/ Dr. Eden Emms.  Signed, Tereso Newcomer, PA-C

## 2022-11-22 ENCOUNTER — Encounter: Payer: Self-pay | Admitting: Physician Assistant

## 2022-11-22 ENCOUNTER — Ambulatory Visit: Payer: Medicare Other | Attending: Cardiovascular Disease | Admitting: Physician Assistant

## 2022-11-22 VITALS — BP 108/40 | HR 104 | Ht 63.0 in | Wt 156.0 lb

## 2022-11-22 DIAGNOSIS — I4891 Unspecified atrial fibrillation: Secondary | ICD-10-CM | POA: Diagnosis not present

## 2022-11-22 DIAGNOSIS — D631 Anemia in chronic kidney disease: Secondary | ICD-10-CM | POA: Diagnosis not present

## 2022-11-22 DIAGNOSIS — K219 Gastro-esophageal reflux disease without esophagitis: Secondary | ICD-10-CM | POA: Diagnosis not present

## 2022-11-22 DIAGNOSIS — L03115 Cellulitis of right lower limb: Secondary | ICD-10-CM | POA: Diagnosis not present

## 2022-11-22 DIAGNOSIS — L89892 Pressure ulcer of other site, stage 2: Secondary | ICD-10-CM | POA: Diagnosis not present

## 2022-11-22 DIAGNOSIS — I4819 Other persistent atrial fibrillation: Secondary | ICD-10-CM | POA: Insufficient documentation

## 2022-11-22 DIAGNOSIS — N184 Chronic kidney disease, stage 4 (severe): Secondary | ICD-10-CM | POA: Insufficient documentation

## 2022-11-22 DIAGNOSIS — E1122 Type 2 diabetes mellitus with diabetic chronic kidney disease: Secondary | ICD-10-CM | POA: Diagnosis not present

## 2022-11-22 DIAGNOSIS — I5032 Chronic diastolic (congestive) heart failure: Secondary | ICD-10-CM

## 2022-11-22 DIAGNOSIS — E1165 Type 2 diabetes mellitus with hyperglycemia: Secondary | ICD-10-CM | POA: Diagnosis not present

## 2022-11-22 DIAGNOSIS — I10 Essential (primary) hypertension: Secondary | ICD-10-CM | POA: Diagnosis not present

## 2022-11-22 DIAGNOSIS — K2980 Duodenitis without bleeding: Secondary | ICD-10-CM | POA: Diagnosis not present

## 2022-11-22 DIAGNOSIS — E1151 Type 2 diabetes mellitus with diabetic peripheral angiopathy without gangrene: Secondary | ICD-10-CM | POA: Diagnosis not present

## 2022-11-22 DIAGNOSIS — E559 Vitamin D deficiency, unspecified: Secondary | ICD-10-CM | POA: Diagnosis not present

## 2022-11-22 DIAGNOSIS — K259 Gastric ulcer, unspecified as acute or chronic, without hemorrhage or perforation: Secondary | ICD-10-CM | POA: Diagnosis not present

## 2022-11-22 DIAGNOSIS — H9193 Unspecified hearing loss, bilateral: Secondary | ICD-10-CM | POA: Diagnosis not present

## 2022-11-22 DIAGNOSIS — D62 Acute posthemorrhagic anemia: Secondary | ICD-10-CM | POA: Diagnosis not present

## 2022-11-22 DIAGNOSIS — I482 Chronic atrial fibrillation, unspecified: Secondary | ICD-10-CM | POA: Diagnosis not present

## 2022-11-22 DIAGNOSIS — E7849 Other hyperlipidemia: Secondary | ICD-10-CM | POA: Diagnosis not present

## 2022-11-22 DIAGNOSIS — I872 Venous insufficiency (chronic) (peripheral): Secondary | ICD-10-CM | POA: Diagnosis not present

## 2022-11-22 DIAGNOSIS — M1990 Unspecified osteoarthritis, unspecified site: Secondary | ICD-10-CM | POA: Diagnosis not present

## 2022-11-22 DIAGNOSIS — N179 Acute kidney failure, unspecified: Secondary | ICD-10-CM | POA: Diagnosis not present

## 2022-11-22 DIAGNOSIS — I890 Lymphedema, not elsewhere classified: Secondary | ICD-10-CM | POA: Diagnosis not present

## 2022-11-22 DIAGNOSIS — G8929 Other chronic pain: Secondary | ICD-10-CM | POA: Diagnosis not present

## 2022-11-22 DIAGNOSIS — K5730 Diverticulosis of large intestine without perforation or abscess without bleeding: Secondary | ICD-10-CM | POA: Diagnosis not present

## 2022-11-22 DIAGNOSIS — I130 Hypertensive heart and chronic kidney disease with heart failure and stage 1 through stage 4 chronic kidney disease, or unspecified chronic kidney disease: Secondary | ICD-10-CM | POA: Diagnosis not present

## 2022-11-22 DIAGNOSIS — K2970 Gastritis, unspecified, without bleeding: Secondary | ICD-10-CM | POA: Diagnosis not present

## 2022-11-22 NOTE — Assessment & Plan Note (Signed)
F/u with nephrology as planned.

## 2022-11-22 NOTE — Assessment & Plan Note (Signed)
She remains in atrial fibrillation. She has fair control of her HR. Her BP is soft. I do not think we need to adjust her rate controlling medications any further. She has had 2 GI bleeds since starting on Eliquis in 05/2022. She required transfusions with PRBCs both times. She is likely too high risk to continue anticoagulation long term. CHA2DS2-VASc Score = 6 [CHF History: 0, HTN History: 1, Diabetes History: 1, Stroke History: 0, Vascular Disease History: 1, Age Score: 2, Gender Score: 1].  Therefore, the patient's annual risk of stroke is 9.7 %. HAS-BLED score is 4 indicating a Yearly Major Bleeding Risk of 8.7%. I reviewed her case with Dr. Eden Emms who agreed we should refer her to Dr. Lalla Brothers for consideration of LAAOD (Watchman).  Continue Eliquis 2.5 mg twice daily Continue Metoprolol succinate 50 mg once daily  Refer to Dr. Lalla Brothers for consideration of LAAOD F/u with Dr. Eden Emms in 3 mos

## 2022-11-22 NOTE — Assessment & Plan Note (Signed)
She has massive LE edema. This is mainly lymphedema. However she also has a component of (HFpEF) heart failure with preserved ejection fraction. Her nephrologist (Dr. Allena Katz) has recently placed her on Metolazone 5 mg once daily for 30 days in addition to her Lasix 80 mg twice daily. She is also getting set up for pneumatic compression devices at home. Continue f/u with Nephrology for management of diuretics. I will request recent labs from primary care.

## 2022-11-22 NOTE — Patient Instructions (Signed)
Medication Instructions:  Your physician recommends that you continue on your current medications as directed. Please refer to the Current Medication list given to you today.  *If you need a refill on your cardiac medications before your next appointment, please call your pharmacy*   Lab Work: None ordered  If you have labs (blood work) drawn today and your tests are completely normal, you will receive your results only by: MyChart Message (if you have MyChart) OR A paper copy in the mail If you have any lab test that is abnormal or we need to change your treatment, we will call you to review the results.   Testing/Procedures: None ordered  You have been referred to Dr. Lalla Brothers to discuss Watchmen    Follow-Up: At [facility], you and your health needs are our priority.  As part of our continuing mission to provide you with exceptional heart care, we have created designated Provider Care Teams.  These Care Teams include your primary Cardiologist (physician) and Advanced Practice Providers (APPs -  Physician Assistants and Nurse Practitioners) who all work together to provide you with the care you need, when you need it.  We recommend signing up for the patient portal called "MyChart".  Sign up information is provided on this After Visit Summary.  MyChart is used to connect with patients for Virtual Visits (Telemedicine).  Patients are able to view lab/test results, encounter notes, upcoming appointments, etc.  Non-urgent messages can be sent to your provider as well.   To learn more about what you can do with MyChart, go to [email or web address].    Your next appointment:   3 month(s)  Provider:   Charlton Haws, MD     Other Instructions

## 2022-11-22 NOTE — Assessment & Plan Note (Signed)
BP is controlled on current Rx. Continue Doxazosin 4 mg once daily, Imdur 15 mg once daily, Metoprolol succinate 50 mg once daily.

## 2022-11-23 ENCOUNTER — Encounter (HOSPITAL_COMMUNITY)
Admission: RE | Admit: 2022-11-23 | Discharge: 2022-11-23 | Disposition: A | Discharge status: Home / Self Care | Payer: Medicare Other | Source: Ambulatory Visit | Attending: Nephrology | Admitting: Nephrology

## 2022-11-23 VITALS — BP 138/53 | HR 92 | Temp 97.3°F

## 2022-11-23 DIAGNOSIS — L89892 Pressure ulcer of other site, stage 2: Secondary | ICD-10-CM | POA: Diagnosis not present

## 2022-11-23 DIAGNOSIS — K2980 Duodenitis without bleeding: Secondary | ICD-10-CM | POA: Diagnosis not present

## 2022-11-23 DIAGNOSIS — K5730 Diverticulosis of large intestine without perforation or abscess without bleeding: Secondary | ICD-10-CM | POA: Diagnosis not present

## 2022-11-23 DIAGNOSIS — K2970 Gastritis, unspecified, without bleeding: Secondary | ICD-10-CM | POA: Diagnosis not present

## 2022-11-23 DIAGNOSIS — N184 Chronic kidney disease, stage 4 (severe): Secondary | ICD-10-CM | POA: Insufficient documentation

## 2022-11-23 DIAGNOSIS — L03115 Cellulitis of right lower limb: Secondary | ICD-10-CM | POA: Diagnosis not present

## 2022-11-23 DIAGNOSIS — K259 Gastric ulcer, unspecified as acute or chronic, without hemorrhage or perforation: Secondary | ICD-10-CM | POA: Diagnosis not present

## 2022-11-23 LAB — CBC WITH DIFFERENTIAL/PLATELET
Abs Immature Granulocytes: 0.02 10*3/uL (ref 0.00–0.07)
Basophils Absolute: 0 10*3/uL (ref 0.0–0.1)
Basophils Relative: 1 %
Eosinophils Absolute: 0 10*3/uL (ref 0.0–0.5)
Eosinophils Relative: 1 %
HCT: 32.7 % — ABNORMAL LOW (ref 36.0–46.0)
Hemoglobin: 10.4 g/dL — ABNORMAL LOW (ref 12.0–15.0)
Immature Granulocytes: 1 %
Lymphocytes Relative: 34 %
Lymphs Abs: 1.4 10*3/uL (ref 0.7–4.0)
MCH: 29.9 pg (ref 26.0–34.0)
MCHC: 31.8 g/dL (ref 30.0–36.0)
MCV: 94 fL (ref 80.0–100.0)
Monocytes Absolute: 0.4 10*3/uL (ref 0.1–1.0)
Monocytes Relative: 11 %
Neutro Abs: 2.3 10*3/uL (ref 1.7–7.7)
Neutrophils Relative %: 52 %
Platelets: 287 10*3/uL (ref 150–400)
RBC: 3.48 MIL/uL — ABNORMAL LOW (ref 3.87–5.11)
RDW: 15.5 % (ref 11.5–15.5)
WBC: 4.2 10*3/uL (ref 4.0–10.5)
nRBC: 0 % (ref 0.0–0.2)

## 2022-11-23 LAB — URINALYSIS, COMPLETE (UACMP) WITH MICROSCOPIC
Bacteria, UA: NONE SEEN
Bilirubin Urine: NEGATIVE
Glucose, UA: NEGATIVE mg/dL
Hgb urine dipstick: NEGATIVE
Ketones, ur: NEGATIVE mg/dL
Leukocytes,Ua: NEGATIVE
Nitrite: NEGATIVE
Protein, ur: NEGATIVE mg/dL
Specific Gravity, Urine: 1.008 (ref 1.005–1.030)
pH: 5 (ref 5.0–8.0)

## 2022-11-23 LAB — FERRITIN: Ferritin: 898 ng/mL — ABNORMAL HIGH (ref 11–307)

## 2022-11-23 LAB — IRON AND TIBC
Iron: 79 ug/dL (ref 28–170)
Saturation Ratios: 20 % (ref 10.4–31.8)
TIBC: 396 ug/dL (ref 250–450)
UIBC: 317 ug/dL

## 2022-11-23 MED ORDER — EPOETIN ALFA-EPBX 10000 UNIT/ML IJ SOLN
INTRAMUSCULAR | Status: AC
  Filled 2022-11-23: qty 1

## 2022-11-23 MED ORDER — EPOETIN ALFA-EPBX 10000 UNIT/ML IJ SOLN
10000.0000 [IU] | INTRAMUSCULAR | Status: DC
  Administered 2022-11-23: 10000 [IU] via SUBCUTANEOUS

## 2022-11-24 DIAGNOSIS — L03115 Cellulitis of right lower limb: Secondary | ICD-10-CM | POA: Diagnosis not present

## 2022-11-24 DIAGNOSIS — K2970 Gastritis, unspecified, without bleeding: Secondary | ICD-10-CM | POA: Diagnosis not present

## 2022-11-24 DIAGNOSIS — L89892 Pressure ulcer of other site, stage 2: Secondary | ICD-10-CM | POA: Diagnosis not present

## 2022-11-24 DIAGNOSIS — K259 Gastric ulcer, unspecified as acute or chronic, without hemorrhage or perforation: Secondary | ICD-10-CM | POA: Diagnosis not present

## 2022-11-24 DIAGNOSIS — K5730 Diverticulosis of large intestine without perforation or abscess without bleeding: Secondary | ICD-10-CM | POA: Diagnosis not present

## 2022-11-24 DIAGNOSIS — K2980 Duodenitis without bleeding: Secondary | ICD-10-CM | POA: Diagnosis not present

## 2022-11-24 LAB — POCT HEMOGLOBIN-HEMACUE: Hemoglobin: 10.8 g/dL — ABNORMAL LOW (ref 12.0–15.0)

## 2022-11-25 DIAGNOSIS — K5730 Diverticulosis of large intestine without perforation or abscess without bleeding: Secondary | ICD-10-CM | POA: Diagnosis not present

## 2022-11-25 DIAGNOSIS — L03115 Cellulitis of right lower limb: Secondary | ICD-10-CM | POA: Diagnosis not present

## 2022-11-25 DIAGNOSIS — L89892 Pressure ulcer of other site, stage 2: Secondary | ICD-10-CM | POA: Diagnosis not present

## 2022-11-25 DIAGNOSIS — K2970 Gastritis, unspecified, without bleeding: Secondary | ICD-10-CM | POA: Diagnosis not present

## 2022-11-25 DIAGNOSIS — K259 Gastric ulcer, unspecified as acute or chronic, without hemorrhage or perforation: Secondary | ICD-10-CM | POA: Diagnosis not present

## 2022-11-25 DIAGNOSIS — D509 Iron deficiency anemia, unspecified: Secondary | ICD-10-CM | POA: Diagnosis not present

## 2022-11-25 DIAGNOSIS — K2980 Duodenitis without bleeding: Secondary | ICD-10-CM | POA: Diagnosis not present

## 2022-11-26 ENCOUNTER — Telehealth: Payer: Self-pay | Admitting: Physician Assistant

## 2022-11-26 DIAGNOSIS — K2970 Gastritis, unspecified, without bleeding: Secondary | ICD-10-CM | POA: Diagnosis not present

## 2022-11-26 DIAGNOSIS — L89892 Pressure ulcer of other site, stage 2: Secondary | ICD-10-CM | POA: Diagnosis not present

## 2022-11-26 DIAGNOSIS — K259 Gastric ulcer, unspecified as acute or chronic, without hemorrhage or perforation: Secondary | ICD-10-CM | POA: Diagnosis not present

## 2022-11-26 DIAGNOSIS — L03115 Cellulitis of right lower limb: Secondary | ICD-10-CM | POA: Diagnosis not present

## 2022-11-26 DIAGNOSIS — K5730 Diverticulosis of large intestine without perforation or abscess without bleeding: Secondary | ICD-10-CM | POA: Diagnosis not present

## 2022-11-26 DIAGNOSIS — K2980 Duodenitis without bleeding: Secondary | ICD-10-CM | POA: Diagnosis not present

## 2022-11-26 NOTE — Telephone Encounter (Signed)
Spoke with pt and pt's daughter, Zella Ball.  They have both been made aware of pt's lab results.  Per Zella Ball, pt has a follow-up with her PCP 5/28 and they will get him to do the lab work and make sure we receive a copy.

## 2022-11-26 NOTE — Telephone Encounter (Signed)
Reviewed labs obtained by primary care.  11/20/2022: BNP 646.4, Hgb 9.7, creatinine 2.93, K+ 5.3, ALT 15  Creatinine is stable. K+ is high normal. BNP is elevated. Hgb is somewhat lower since DC.  Nephrologist is managing her diuresis and has her on Metolazone daily for the next month. She is on K+ supplementation.  PLAN: -If pt does not have f/u labs planned in the next 2 weeks with PCP, GI or Nephrology, please arrange BMET, CBC in 1-2 weeks.  Tereso Newcomer, PA-C    11/26/2022 9:08 AM

## 2022-11-30 DIAGNOSIS — K259 Gastric ulcer, unspecified as acute or chronic, without hemorrhage or perforation: Secondary | ICD-10-CM | POA: Diagnosis not present

## 2022-11-30 DIAGNOSIS — L89892 Pressure ulcer of other site, stage 2: Secondary | ICD-10-CM | POA: Diagnosis not present

## 2022-11-30 DIAGNOSIS — K2980 Duodenitis without bleeding: Secondary | ICD-10-CM | POA: Diagnosis not present

## 2022-11-30 DIAGNOSIS — K5730 Diverticulosis of large intestine without perforation or abscess without bleeding: Secondary | ICD-10-CM | POA: Diagnosis not present

## 2022-11-30 DIAGNOSIS — K2970 Gastritis, unspecified, without bleeding: Secondary | ICD-10-CM | POA: Diagnosis not present

## 2022-11-30 DIAGNOSIS — L03115 Cellulitis of right lower limb: Secondary | ICD-10-CM | POA: Diagnosis not present

## 2022-12-03 DIAGNOSIS — K2970 Gastritis, unspecified, without bleeding: Secondary | ICD-10-CM | POA: Diagnosis not present

## 2022-12-03 DIAGNOSIS — K2980 Duodenitis without bleeding: Secondary | ICD-10-CM | POA: Diagnosis not present

## 2022-12-03 DIAGNOSIS — L89892 Pressure ulcer of other site, stage 2: Secondary | ICD-10-CM | POA: Diagnosis not present

## 2022-12-03 DIAGNOSIS — K259 Gastric ulcer, unspecified as acute or chronic, without hemorrhage or perforation: Secondary | ICD-10-CM | POA: Diagnosis not present

## 2022-12-03 DIAGNOSIS — L03115 Cellulitis of right lower limb: Secondary | ICD-10-CM | POA: Diagnosis not present

## 2022-12-03 DIAGNOSIS — K5730 Diverticulosis of large intestine without perforation or abscess without bleeding: Secondary | ICD-10-CM | POA: Diagnosis not present

## 2022-12-06 DIAGNOSIS — K2970 Gastritis, unspecified, without bleeding: Secondary | ICD-10-CM | POA: Diagnosis not present

## 2022-12-06 DIAGNOSIS — K2980 Duodenitis without bleeding: Secondary | ICD-10-CM | POA: Diagnosis not present

## 2022-12-06 DIAGNOSIS — K259 Gastric ulcer, unspecified as acute or chronic, without hemorrhage or perforation: Secondary | ICD-10-CM | POA: Diagnosis not present

## 2022-12-06 DIAGNOSIS — K5730 Diverticulosis of large intestine without perforation or abscess without bleeding: Secondary | ICD-10-CM | POA: Diagnosis not present

## 2022-12-06 DIAGNOSIS — L03115 Cellulitis of right lower limb: Secondary | ICD-10-CM | POA: Diagnosis not present

## 2022-12-06 DIAGNOSIS — L89892 Pressure ulcer of other site, stage 2: Secondary | ICD-10-CM | POA: Diagnosis not present

## 2022-12-07 ENCOUNTER — Ambulatory Visit
Admission: RE | Admit: 2022-12-07 | Discharge: 2022-12-07 | Disposition: A | Discharge status: Home / Self Care | Payer: Medicare Other | Source: Ambulatory Visit | Attending: Internal Medicine | Admitting: Internal Medicine

## 2022-12-07 ENCOUNTER — Other Ambulatory Visit: Payer: Self-pay | Admitting: Internal Medicine

## 2022-12-07 ENCOUNTER — Encounter (HOSPITAL_COMMUNITY)
Admission: RE | Admit: 2022-12-07 | Discharge: 2022-12-07 | Disposition: A | Discharge status: Home / Self Care | Payer: Medicare Other | Source: Ambulatory Visit | Attending: Nephrology | Admitting: Nephrology

## 2022-12-07 VITALS — BP 126/64 | HR 90 | Temp 97.1°F | Resp 17

## 2022-12-07 DIAGNOSIS — K921 Melena: Secondary | ICD-10-CM

## 2022-12-07 DIAGNOSIS — Z9889 Other specified postprocedural states: Secondary | ICD-10-CM | POA: Diagnosis not present

## 2022-12-07 DIAGNOSIS — N184 Chronic kidney disease, stage 4 (severe): Secondary | ICD-10-CM

## 2022-12-07 LAB — POCT HEMOGLOBIN-HEMACUE: Hemoglobin: 10.3 g/dL — ABNORMAL LOW (ref 12.0–15.0)

## 2022-12-07 MED ORDER — EPOETIN ALFA-EPBX 10000 UNIT/ML IJ SOLN: 10000.0000 [IU] | INTRAMUSCULAR | Status: DC

## 2022-12-07 MED ORDER — EPOETIN ALFA-EPBX 10000 UNIT/ML IJ SOLN
INTRAMUSCULAR | Status: AC
  Administered 2022-12-07: 10000 [IU]
  Filled 2022-12-07: qty 1

## 2022-12-08 DIAGNOSIS — K5730 Diverticulosis of large intestine without perforation or abscess without bleeding: Secondary | ICD-10-CM | POA: Diagnosis not present

## 2022-12-08 DIAGNOSIS — L89892 Pressure ulcer of other site, stage 2: Secondary | ICD-10-CM | POA: Diagnosis not present

## 2022-12-08 DIAGNOSIS — K2980 Duodenitis without bleeding: Secondary | ICD-10-CM | POA: Diagnosis not present

## 2022-12-08 DIAGNOSIS — K2970 Gastritis, unspecified, without bleeding: Secondary | ICD-10-CM | POA: Diagnosis not present

## 2022-12-08 DIAGNOSIS — K259 Gastric ulcer, unspecified as acute or chronic, without hemorrhage or perforation: Secondary | ICD-10-CM | POA: Diagnosis not present

## 2022-12-08 DIAGNOSIS — L03115 Cellulitis of right lower limb: Secondary | ICD-10-CM | POA: Diagnosis not present

## 2022-12-13 DIAGNOSIS — K259 Gastric ulcer, unspecified as acute or chronic, without hemorrhage or perforation: Secondary | ICD-10-CM | POA: Diagnosis not present

## 2022-12-13 DIAGNOSIS — L89892 Pressure ulcer of other site, stage 2: Secondary | ICD-10-CM | POA: Diagnosis not present

## 2022-12-13 DIAGNOSIS — K2980 Duodenitis without bleeding: Secondary | ICD-10-CM | POA: Diagnosis not present

## 2022-12-13 DIAGNOSIS — K5730 Diverticulosis of large intestine without perforation or abscess without bleeding: Secondary | ICD-10-CM | POA: Diagnosis not present

## 2022-12-13 DIAGNOSIS — K2970 Gastritis, unspecified, without bleeding: Secondary | ICD-10-CM | POA: Diagnosis not present

## 2022-12-13 DIAGNOSIS — L03115 Cellulitis of right lower limb: Secondary | ICD-10-CM | POA: Diagnosis not present

## 2022-12-14 DIAGNOSIS — I5032 Chronic diastolic (congestive) heart failure: Secondary | ICD-10-CM | POA: Diagnosis not present

## 2022-12-14 DIAGNOSIS — I890 Lymphedema, not elsewhere classified: Secondary | ICD-10-CM | POA: Diagnosis not present

## 2022-12-14 DIAGNOSIS — K219 Gastro-esophageal reflux disease without esophagitis: Secondary | ICD-10-CM | POA: Diagnosis not present

## 2022-12-14 DIAGNOSIS — R54 Age-related physical debility: Secondary | ICD-10-CM | POA: Diagnosis not present

## 2022-12-14 DIAGNOSIS — L03115 Cellulitis of right lower limb: Secondary | ICD-10-CM | POA: Diagnosis not present

## 2022-12-14 DIAGNOSIS — I4891 Unspecified atrial fibrillation: Secondary | ICD-10-CM | POA: Diagnosis not present

## 2022-12-14 DIAGNOSIS — K2980 Duodenitis without bleeding: Secondary | ICD-10-CM | POA: Diagnosis not present

## 2022-12-14 DIAGNOSIS — K259 Gastric ulcer, unspecified as acute or chronic, without hemorrhage or perforation: Secondary | ICD-10-CM | POA: Diagnosis not present

## 2022-12-14 DIAGNOSIS — I130 Hypertensive heart and chronic kidney disease with heart failure and stage 1 through stage 4 chronic kidney disease, or unspecified chronic kidney disease: Secondary | ICD-10-CM | POA: Diagnosis not present

## 2022-12-14 DIAGNOSIS — E782 Mixed hyperlipidemia: Secondary | ICD-10-CM | POA: Diagnosis not present

## 2022-12-14 DIAGNOSIS — K5730 Diverticulosis of large intestine without perforation or abscess without bleeding: Secondary | ICD-10-CM | POA: Diagnosis not present

## 2022-12-14 DIAGNOSIS — T466X5A Adverse effect of antihyperlipidemic and antiarteriosclerotic drugs, initial encounter: Secondary | ICD-10-CM | POA: Diagnosis not present

## 2022-12-14 DIAGNOSIS — K2970 Gastritis, unspecified, without bleeding: Secondary | ICD-10-CM | POA: Diagnosis not present

## 2022-12-14 DIAGNOSIS — Z0000 Encounter for general adult medical examination without abnormal findings: Secondary | ICD-10-CM | POA: Diagnosis not present

## 2022-12-14 DIAGNOSIS — Z13820 Encounter for screening for osteoporosis: Secondary | ICD-10-CM | POA: Diagnosis not present

## 2022-12-14 DIAGNOSIS — L89892 Pressure ulcer of other site, stage 2: Secondary | ICD-10-CM | POA: Diagnosis not present

## 2022-12-14 DIAGNOSIS — D509 Iron deficiency anemia, unspecified: Secondary | ICD-10-CM | POA: Diagnosis not present

## 2022-12-14 LAB — LAB REPORT - SCANNED: EGFR: 9

## 2022-12-15 DIAGNOSIS — K2980 Duodenitis without bleeding: Secondary | ICD-10-CM | POA: Diagnosis not present

## 2022-12-15 DIAGNOSIS — K5730 Diverticulosis of large intestine without perforation or abscess without bleeding: Secondary | ICD-10-CM | POA: Diagnosis not present

## 2022-12-15 DIAGNOSIS — L03115 Cellulitis of right lower limb: Secondary | ICD-10-CM | POA: Diagnosis not present

## 2022-12-15 DIAGNOSIS — K2970 Gastritis, unspecified, without bleeding: Secondary | ICD-10-CM | POA: Diagnosis not present

## 2022-12-15 DIAGNOSIS — L89892 Pressure ulcer of other site, stage 2: Secondary | ICD-10-CM | POA: Diagnosis not present

## 2022-12-15 DIAGNOSIS — K259 Gastric ulcer, unspecified as acute or chronic, without hemorrhage or perforation: Secondary | ICD-10-CM | POA: Diagnosis not present

## 2022-12-16 DIAGNOSIS — R634 Abnormal weight loss: Secondary | ICD-10-CM | POA: Diagnosis not present

## 2022-12-16 DIAGNOSIS — R194 Change in bowel habit: Secondary | ICD-10-CM | POA: Diagnosis not present

## 2022-12-16 DIAGNOSIS — K254 Chronic or unspecified gastric ulcer with hemorrhage: Secondary | ICD-10-CM | POA: Diagnosis not present

## 2022-12-21 ENCOUNTER — Encounter (HOSPITAL_COMMUNITY)
Admission: RE | Admit: 2022-12-21 | Discharge: 2022-12-21 | Disposition: A | Discharge status: Home / Self Care | Payer: Medicare Other | Source: Ambulatory Visit | Attending: Nephrology | Admitting: Nephrology

## 2022-12-21 VITALS — BP 125/54 | HR 85 | Temp 97.5°F | Resp 18

## 2022-12-21 DIAGNOSIS — N184 Chronic kidney disease, stage 4 (severe): Secondary | ICD-10-CM | POA: Diagnosis not present

## 2022-12-21 LAB — CBC WITH DIFFERENTIAL/PLATELET
Abs Immature Granulocytes: 0.01 10*3/uL (ref 0.00–0.07)
Basophils Absolute: 0 10*3/uL (ref 0.0–0.1)
Basophils Relative: 0 %
Eosinophils Absolute: 0.1 10*3/uL (ref 0.0–0.5)
Eosinophils Relative: 2 %
HCT: 30.5 % — ABNORMAL LOW (ref 36.0–46.0)
Hemoglobin: 9.7 g/dL — ABNORMAL LOW (ref 12.0–15.0)
Immature Granulocytes: 0 %
Lymphocytes Relative: 32 %
Lymphs Abs: 1.3 10*3/uL (ref 0.7–4.0)
MCH: 29.4 pg (ref 26.0–34.0)
MCHC: 31.8 g/dL (ref 30.0–36.0)
MCV: 92.4 fL (ref 80.0–100.0)
Monocytes Absolute: 0.3 10*3/uL (ref 0.1–1.0)
Monocytes Relative: 8 %
Neutro Abs: 2.3 10*3/uL (ref 1.7–7.7)
Neutrophils Relative %: 58 %
Platelets: 164 10*3/uL (ref 150–400)
RBC: 3.3 MIL/uL — ABNORMAL LOW (ref 3.87–5.11)
RDW: 14.1 % (ref 11.5–15.5)
WBC: 4 10*3/uL (ref 4.0–10.5)
nRBC: 0 % (ref 0.0–0.2)

## 2022-12-21 LAB — IRON AND TIBC
Iron: 67 ug/dL (ref 28–170)
Saturation Ratios: 17 % (ref 10.4–31.8)
TIBC: 405 ug/dL (ref 250–450)
UIBC: 338 ug/dL

## 2022-12-21 LAB — POCT HEMOGLOBIN-HEMACUE: Hemoglobin: 9.9 g/dL — ABNORMAL LOW (ref 12.0–15.0)

## 2022-12-21 LAB — FERRITIN: Ferritin: 847 ng/mL — ABNORMAL HIGH (ref 11–307)

## 2022-12-21 MED ORDER — EPOETIN ALFA-EPBX 10000 UNIT/ML IJ SOLN
INTRAMUSCULAR | Status: AC
  Filled 2022-12-21: qty 1

## 2022-12-21 MED ORDER — EPOETIN ALFA-EPBX 10000 UNIT/ML IJ SOLN
10000.0000 [IU] | INTRAMUSCULAR | Status: DC
  Administered 2022-12-21: 10000 [IU] via SUBCUTANEOUS

## 2022-12-22 DIAGNOSIS — E1122 Type 2 diabetes mellitus with diabetic chronic kidney disease: Secondary | ICD-10-CM | POA: Diagnosis not present

## 2022-12-22 DIAGNOSIS — D62 Acute posthemorrhagic anemia: Secondary | ICD-10-CM | POA: Diagnosis not present

## 2022-12-22 DIAGNOSIS — K2980 Duodenitis without bleeding: Secondary | ICD-10-CM | POA: Diagnosis not present

## 2022-12-22 DIAGNOSIS — G8929 Other chronic pain: Secondary | ICD-10-CM | POA: Diagnosis not present

## 2022-12-22 DIAGNOSIS — E559 Vitamin D deficiency, unspecified: Secondary | ICD-10-CM | POA: Diagnosis not present

## 2022-12-22 DIAGNOSIS — L03115 Cellulitis of right lower limb: Secondary | ICD-10-CM | POA: Diagnosis not present

## 2022-12-22 DIAGNOSIS — E1165 Type 2 diabetes mellitus with hyperglycemia: Secondary | ICD-10-CM | POA: Diagnosis not present

## 2022-12-22 DIAGNOSIS — D631 Anemia in chronic kidney disease: Secondary | ICD-10-CM | POA: Diagnosis not present

## 2022-12-22 DIAGNOSIS — I482 Chronic atrial fibrillation, unspecified: Secondary | ICD-10-CM | POA: Diagnosis not present

## 2022-12-22 DIAGNOSIS — N179 Acute kidney failure, unspecified: Secondary | ICD-10-CM | POA: Diagnosis not present

## 2022-12-22 DIAGNOSIS — K5730 Diverticulosis of large intestine without perforation or abscess without bleeding: Secondary | ICD-10-CM | POA: Diagnosis not present

## 2022-12-22 DIAGNOSIS — K2970 Gastritis, unspecified, without bleeding: Secondary | ICD-10-CM | POA: Diagnosis not present

## 2022-12-22 DIAGNOSIS — I872 Venous insufficiency (chronic) (peripheral): Secondary | ICD-10-CM | POA: Diagnosis not present

## 2022-12-22 DIAGNOSIS — E1151 Type 2 diabetes mellitus with diabetic peripheral angiopathy without gangrene: Secondary | ICD-10-CM | POA: Diagnosis not present

## 2022-12-22 DIAGNOSIS — E7849 Other hyperlipidemia: Secondary | ICD-10-CM | POA: Diagnosis not present

## 2022-12-22 DIAGNOSIS — H9193 Unspecified hearing loss, bilateral: Secondary | ICD-10-CM | POA: Diagnosis not present

## 2022-12-22 DIAGNOSIS — I890 Lymphedema, not elsewhere classified: Secondary | ICD-10-CM | POA: Diagnosis not present

## 2022-12-22 DIAGNOSIS — I130 Hypertensive heart and chronic kidney disease with heart failure and stage 1 through stage 4 chronic kidney disease, or unspecified chronic kidney disease: Secondary | ICD-10-CM | POA: Diagnosis not present

## 2022-12-22 DIAGNOSIS — L89892 Pressure ulcer of other site, stage 2: Secondary | ICD-10-CM | POA: Diagnosis not present

## 2022-12-22 DIAGNOSIS — K259 Gastric ulcer, unspecified as acute or chronic, without hemorrhage or perforation: Secondary | ICD-10-CM | POA: Diagnosis not present

## 2022-12-22 DIAGNOSIS — M1990 Unspecified osteoarthritis, unspecified site: Secondary | ICD-10-CM | POA: Diagnosis not present

## 2022-12-22 DIAGNOSIS — I5032 Chronic diastolic (congestive) heart failure: Secondary | ICD-10-CM | POA: Diagnosis not present

## 2022-12-22 DIAGNOSIS — N184 Chronic kidney disease, stage 4 (severe): Secondary | ICD-10-CM | POA: Diagnosis not present

## 2022-12-22 DIAGNOSIS — K219 Gastro-esophageal reflux disease without esophagitis: Secondary | ICD-10-CM | POA: Diagnosis not present

## 2022-12-23 DIAGNOSIS — L89892 Pressure ulcer of other site, stage 2: Secondary | ICD-10-CM | POA: Diagnosis not present

## 2022-12-23 DIAGNOSIS — L03115 Cellulitis of right lower limb: Secondary | ICD-10-CM | POA: Diagnosis not present

## 2022-12-23 DIAGNOSIS — K5730 Diverticulosis of large intestine without perforation or abscess without bleeding: Secondary | ICD-10-CM | POA: Diagnosis not present

## 2022-12-23 DIAGNOSIS — K2970 Gastritis, unspecified, without bleeding: Secondary | ICD-10-CM | POA: Diagnosis not present

## 2022-12-23 DIAGNOSIS — K259 Gastric ulcer, unspecified as acute or chronic, without hemorrhage or perforation: Secondary | ICD-10-CM | POA: Diagnosis not present

## 2022-12-23 DIAGNOSIS — K2980 Duodenitis without bleeding: Secondary | ICD-10-CM | POA: Diagnosis not present

## 2022-12-27 DIAGNOSIS — K2980 Duodenitis without bleeding: Secondary | ICD-10-CM | POA: Diagnosis not present

## 2022-12-27 DIAGNOSIS — K259 Gastric ulcer, unspecified as acute or chronic, without hemorrhage or perforation: Secondary | ICD-10-CM | POA: Diagnosis not present

## 2022-12-27 DIAGNOSIS — L03115 Cellulitis of right lower limb: Secondary | ICD-10-CM | POA: Diagnosis not present

## 2022-12-27 DIAGNOSIS — K5730 Diverticulosis of large intestine without perforation or abscess without bleeding: Secondary | ICD-10-CM | POA: Diagnosis not present

## 2022-12-27 DIAGNOSIS — K2970 Gastritis, unspecified, without bleeding: Secondary | ICD-10-CM | POA: Diagnosis not present

## 2022-12-27 DIAGNOSIS — L89892 Pressure ulcer of other site, stage 2: Secondary | ICD-10-CM | POA: Diagnosis not present

## 2022-12-28 DIAGNOSIS — K2970 Gastritis, unspecified, without bleeding: Secondary | ICD-10-CM | POA: Diagnosis not present

## 2022-12-28 DIAGNOSIS — K259 Gastric ulcer, unspecified as acute or chronic, without hemorrhage or perforation: Secondary | ICD-10-CM | POA: Diagnosis not present

## 2022-12-28 DIAGNOSIS — L89892 Pressure ulcer of other site, stage 2: Secondary | ICD-10-CM | POA: Diagnosis not present

## 2022-12-28 DIAGNOSIS — L03115 Cellulitis of right lower limb: Secondary | ICD-10-CM | POA: Diagnosis not present

## 2022-12-28 DIAGNOSIS — K5730 Diverticulosis of large intestine without perforation or abscess without bleeding: Secondary | ICD-10-CM | POA: Diagnosis not present

## 2022-12-28 DIAGNOSIS — K2980 Duodenitis without bleeding: Secondary | ICD-10-CM | POA: Diagnosis not present

## 2022-12-30 DIAGNOSIS — E782 Mixed hyperlipidemia: Secondary | ICD-10-CM | POA: Diagnosis not present

## 2022-12-30 DIAGNOSIS — I4891 Unspecified atrial fibrillation: Secondary | ICD-10-CM | POA: Diagnosis not present

## 2022-12-30 DIAGNOSIS — N184 Chronic kidney disease, stage 4 (severe): Secondary | ICD-10-CM | POA: Diagnosis not present

## 2022-12-30 DIAGNOSIS — K219 Gastro-esophageal reflux disease without esophagitis: Secondary | ICD-10-CM | POA: Diagnosis not present

## 2022-12-30 DIAGNOSIS — I5032 Chronic diastolic (congestive) heart failure: Secondary | ICD-10-CM | POA: Diagnosis not present

## 2022-12-30 DIAGNOSIS — M1990 Unspecified osteoarthritis, unspecified site: Secondary | ICD-10-CM | POA: Diagnosis not present

## 2022-12-30 DIAGNOSIS — I130 Hypertensive heart and chronic kidney disease with heart failure and stage 1 through stage 4 chronic kidney disease, or unspecified chronic kidney disease: Secondary | ICD-10-CM | POA: Diagnosis not present

## 2022-12-30 DIAGNOSIS — D509 Iron deficiency anemia, unspecified: Secondary | ICD-10-CM | POA: Diagnosis not present

## 2023-01-03 DIAGNOSIS — L89892 Pressure ulcer of other site, stage 2: Secondary | ICD-10-CM | POA: Diagnosis not present

## 2023-01-03 DIAGNOSIS — K2970 Gastritis, unspecified, without bleeding: Secondary | ICD-10-CM | POA: Diagnosis not present

## 2023-01-03 DIAGNOSIS — K5730 Diverticulosis of large intestine without perforation or abscess without bleeding: Secondary | ICD-10-CM | POA: Diagnosis not present

## 2023-01-03 DIAGNOSIS — L03115 Cellulitis of right lower limb: Secondary | ICD-10-CM | POA: Diagnosis not present

## 2023-01-03 DIAGNOSIS — K2980 Duodenitis without bleeding: Secondary | ICD-10-CM | POA: Diagnosis not present

## 2023-01-03 DIAGNOSIS — K259 Gastric ulcer, unspecified as acute or chronic, without hemorrhage or perforation: Secondary | ICD-10-CM | POA: Diagnosis not present

## 2023-01-04 ENCOUNTER — Emergency Department (HOSPITAL_BASED_OUTPATIENT_CLINIC_OR_DEPARTMENT_OTHER)
Admission: EM | Admit: 2023-01-04 | Discharge: 2023-01-04 | Disposition: A | Discharge status: Home / Self Care | Payer: Medicare Other | Attending: Emergency Medicine | Admitting: Emergency Medicine

## 2023-01-04 ENCOUNTER — Other Ambulatory Visit: Payer: Self-pay

## 2023-01-04 ENCOUNTER — Telehealth: Payer: Self-pay | Admitting: Podiatry

## 2023-01-04 ENCOUNTER — Encounter (HOSPITAL_COMMUNITY)
Admission: RE | Admit: 2023-01-04 | Discharge: 2023-01-04 | Disposition: A | Discharge status: Home / Self Care | Payer: Medicare Other | Source: Ambulatory Visit | Attending: Nephrology | Admitting: Nephrology

## 2023-01-04 ENCOUNTER — Encounter (HOSPITAL_BASED_OUTPATIENT_CLINIC_OR_DEPARTMENT_OTHER): Payer: Self-pay

## 2023-01-04 ENCOUNTER — Emergency Department (HOSPITAL_BASED_OUTPATIENT_CLINIC_OR_DEPARTMENT_OTHER): Payer: Medicare Other | Admitting: Radiology

## 2023-01-04 VITALS — BP 142/56 | HR 91 | Temp 97.3°F | Resp 17

## 2023-01-04 DIAGNOSIS — Z7901 Long term (current) use of anticoagulants: Secondary | ICD-10-CM | POA: Insufficient documentation

## 2023-01-04 DIAGNOSIS — M7989 Other specified soft tissue disorders: Secondary | ICD-10-CM | POA: Diagnosis not present

## 2023-01-04 DIAGNOSIS — N184 Chronic kidney disease, stage 4 (severe): Secondary | ICD-10-CM

## 2023-01-04 DIAGNOSIS — L03115 Cellulitis of right lower limb: Secondary | ICD-10-CM | POA: Insufficient documentation

## 2023-01-04 DIAGNOSIS — N189 Chronic kidney disease, unspecified: Secondary | ICD-10-CM | POA: Insufficient documentation

## 2023-01-04 DIAGNOSIS — M79671 Pain in right foot: Secondary | ICD-10-CM | POA: Diagnosis present

## 2023-01-04 LAB — COMPREHENSIVE METABOLIC PANEL
ALT: 9 U/L (ref 0–44)
AST: 26 U/L (ref 15–41)
Albumin: 4.2 g/dL (ref 3.5–5.0)
Alkaline Phosphatase: 25 U/L — ABNORMAL LOW (ref 38–126)
Anion gap: 17 — ABNORMAL HIGH (ref 5–15)
BUN: 97 mg/dL — ABNORMAL HIGH (ref 8–23)
CO2: 26 mmol/L (ref 22–32)
Calcium: 10.2 mg/dL (ref 8.9–10.3)
Chloride: 94 mmol/L — ABNORMAL LOW (ref 98–111)
Creatinine, Ser: 3.9 mg/dL — ABNORMAL HIGH (ref 0.44–1.00)
GFR, Estimated: 11 mL/min — ABNORMAL LOW (ref >60)
Glucose, Bld: 105 mg/dL — ABNORMAL HIGH (ref 70–99)
Potassium: 3.7 mmol/L (ref 3.5–5.1)
Sodium: 137 mmol/L (ref 135–145)
Total Bilirubin: 1.1 mg/dL (ref 0.3–1.2)
Total Protein: 7.6 g/dL (ref 6.5–8.1)

## 2023-01-04 LAB — CBC WITH DIFFERENTIAL/PLATELET
Abs Immature Granulocytes: 0.01 10*3/uL (ref 0.00–0.07)
Basophils Absolute: 0 10*3/uL (ref 0.0–0.1)
Basophils Relative: 1 %
Eosinophils Absolute: 0.3 10*3/uL (ref 0.0–0.5)
Eosinophils Relative: 7 %
HCT: 31.8 % — ABNORMAL LOW (ref 36.0–46.0)
Hemoglobin: 10 g/dL — ABNORMAL LOW (ref 12.0–15.0)
Immature Granulocytes: 0 %
Lymphocytes Relative: 23 %
Lymphs Abs: 1 10*3/uL (ref 0.7–4.0)
MCH: 29.5 pg (ref 26.0–34.0)
MCHC: 31.4 g/dL (ref 30.0–36.0)
MCV: 93.8 fL (ref 80.0–100.0)
Monocytes Absolute: 0.4 10*3/uL (ref 0.1–1.0)
Monocytes Relative: 9 %
Neutro Abs: 2.6 10*3/uL (ref 1.7–7.7)
Neutrophils Relative %: 60 %
Platelets: 130 10*3/uL — ABNORMAL LOW (ref 150–400)
RBC: 3.39 MIL/uL — ABNORMAL LOW (ref 3.87–5.11)
RDW: 14.6 % (ref 11.5–15.5)
WBC: 4.3 10*3/uL (ref 4.0–10.5)
nRBC: 0 % (ref 0.0–0.2)

## 2023-01-04 LAB — POCT HEMOGLOBIN-HEMACUE: Hemoglobin: 9.8 g/dL — ABNORMAL LOW (ref 12.0–15.0)

## 2023-01-04 MED ORDER — EPOETIN ALFA-EPBX 10000 UNIT/ML IJ SOLN
10000.0000 [IU] | INTRAMUSCULAR | Status: DC
  Administered 2023-01-04: 10000 [IU] via SUBCUTANEOUS

## 2023-01-04 MED ORDER — CEPHALEXIN 500 MG PO CAPS: 500.0000 mg | ORAL_CAPSULE | Freq: Two times a day (BID) | ORAL | 0 refills | Status: DC

## 2023-01-04 MED ORDER — CEPHALEXIN 250 MG PO CAPS
250.0000 mg | ORAL_CAPSULE | Freq: Once | ORAL | Status: AC
  Administered 2023-01-04: 250 mg via ORAL
  Filled 2023-01-04: qty 1

## 2023-01-04 MED ORDER — MUPIROCIN 2 % EX OINT: TOPICAL_OINTMENT | Freq: Two times a day (BID) | CUTANEOUS | 0 refills | Status: DC

## 2023-01-04 MED ORDER — EPOETIN ALFA-EPBX 10000 UNIT/ML IJ SOLN
INTRAMUSCULAR | Status: AC
  Filled 2023-01-04: qty 1

## 2023-01-04 NOTE — Discharge Instructions (Signed)
You are seen in the emergency department for worsening swelling and pain of your right lower leg.  You had blood work and x-rays.  You declined IV antibiotics.  We are prescribing you oral antibiotics and topical antibiotics.  Please contact your foot doctor tomorrow for close follow-up.  Return to the emergency department if any worsening or concerning symptoms.

## 2023-01-04 NOTE — ED Triage Notes (Signed)
Patient here Pov from Home.  Endorses Right Foot Swelling and Discomfort for 1 Week. History of Lymphedema.More Clear Drainage and No known Fevers.   NAD Noted durign triage. A&Ox4. GCS 15. BIB Wheelchair.

## 2023-01-04 NOTE — Telephone Encounter (Signed)
HHC nurse called from [facility], stating patient has lymphedema, right foot infection....lower leg and foot are red with some broken areas of skin.  Patient of Dr. Eloy End  [facility] nurse states the patient needs an appointment to be seen. Please call to schedule.

## 2023-01-04 NOTE — ED Provider Notes (Signed)
[place] EMERGENCY DEPARTMENT AT [facility] Provider Note   CSN: [id number] Arrival date & time: 01/04/23  1318     History  Chief Complaint  Patient presents with   Leg Swelling    Tabitha Black is a 89 y.o. female.she has a history of chronic lymphedema, afib, ckd. C/o worsening right foot pain and swelling for 1 week. No trauma, no fever. Follows with [facility], but unable to see. Feels foot looks better after home health cleaned foot. Unable to localize where foot hurts. Wants antibiotics. Declining further lab work, stats has had too much already. Does not want an IV.   The history is provided by the patient.  Foot Pain This is a new problem. The current episode started more than 1 week ago. The problem occurs constantly. The problem has not changed since onset.Pertinent negatives include no chest pain, no abdominal pain, no headaches and no shortness of breath. The symptoms are aggravated by walking. Nothing relieves the symptoms. She has tried rest for the symptoms. The treatment provided no relief.       Home Medications Prior to Admission medications   Medication Sig Start Date End Date Taking? Authorizing Provider  pantoprazole (PROTONIX) 40 MG tablet Take 40 mg by mouth daily. 12/28/22  Yes [provider]  acetaminophen (TYLENOL) 325 MG tablet Take 2 tablets (650 mg total) by mouth every 6 (six) hours as needed for mild pain (or Fever >/= 101). 08/27/22   Ghimire, Werner Lean, MD  apixaban (ELIQUIS) 2.5 MG TABS tablet Take 1 tablet (2.5 mg total) by mouth 2 (two) times daily. 06/08/22   Wendall Stade, MD  Cholecalciferol (VITAMIN D3) 125 MCG (5000 UT) TABS Take 5,000 Units by mouth daily.    [provider]  cyclobenzaprine (FLEXERIL) 10 MG tablet Take 5 mg by mouth daily as needed for muscle spasms.    [provider]  doxazosin (CARDURA) 4 MG tablet Take 4 mg by mouth daily.    [provider]  fenofibrate 160 MG  tablet Take 160 mg by mouth daily. 10/08/22   [provider]  furosemide (LASIX) 80 MG tablet Take 80 mg by mouth 2 (two) times daily.    [provider]  guaiFENesin 200 MG tablet Take 1 tablet (200 mg total) by mouth every 4 (four) hours as needed for cough or to loosen phlegm. 10/06/22   Danford, Earl Lites, MD  HYDROcodone-acetaminophen (NORCO) 10-325 MG tablet Take 1 tablet by mouth at bedtime.    [provider]  isosorbide mononitrate (IMDUR) 30 MG 24 hr tablet Take 1/2 tablet (15 mg) by mouth at bedtime. 10/06/22   Danford, Earl Lites, MD  metolazone (ZAROXOLYN) 5 MG tablet Take 5 mg by mouth daily. For 30 days    [provider]  metoprolol succinate (TOPROL-XL) 50 MG 24 hr tablet Take 1 tablet (50 mg total) by mouth every evening. Take with or immediately following a meal. 11/16/22   Willeen Niece, MD  Multiple Vitamins-Minerals (HAIR SKIN AND NAILS FORMULA) TABS Take 1 tablet by mouth daily.    [provider]  potassium chloride SA (KLOR-CON M) 20 MEQ tablet Take 1 tablet (20 mEq) by mouth daily. 10/06/22   Danford, Earl Lites, MD      Allergies    Other, Penicillins, Lovastatin, Azithromycin, Clindamycin, Hydralazine, Lisinopril, Neomycin sulfate [neomycin], Niacin, Rofecoxib, Tetracycline, Valsartan, Vytorin [ezetimibe-simvastatin], and Zetia [ezetimibe]    Review of Systems   Review of Systems  Constitutional:  Negative for fever.  Respiratory:  Negative for shortness of breath.   Cardiovascular:  Negative for chest pain.  Gastrointestinal:  Negative for abdominal pain.  Skin:  Positive for wound.  Neurological:  Negative for headaches.    Physical Exam Updated Vital Signs BP (!) 155/66 (BP Location: Right Arm)   Pulse 93   Temp 98.2 F (36.8 C) (Oral)   Resp 18   Ht 5\' 3"  (1.6 m)   Wt 69.9 kg   SpO2 100%   BMI 27.28 kg/m  Physical Exam Vitals and nursing note reviewed.  Constitutional:      General: She is not  in acute distress.    Appearance: Normal appearance. She is well-developed.  HENT:     Head: Normocephalic and atraumatic.  Eyes:     Conjunctiva/sclera: Conjunctivae normal.  Cardiovascular:     Rate and Rhythm: Normal rate and regular rhythm.     Heart sounds: No murmur heard. Pulmonary:     Effort: Pulmonary effort is normal. No respiratory distress.     Breath sounds: Normal breath sounds.  Abdominal:     Palpations: Abdomen is soft.     Tenderness: There is no abdominal tenderness.  Musculoskeletal:        General: No swelling.     Cervical back: Neck supple.     Right lower leg: Edema present.     Comments: Right leg enlarged from knee down. Weeping at foot. Diffuse errythema, no significant warmth. No crepitus. No dep ulcerations.   Skin:    General: Skin is warm and dry.     Capillary Refill: Capillary refill takes less than 2 seconds.     Findings: Erythema present.  Neurological:     General: No focal deficit present.     Mental Status: She is alert.     Motor: No weakness.     ED Results / Procedures / Treatments   Labs (all labs ordered are listed, but only abnormal results are displayed) Labs Reviewed  COMPREHENSIVE METABOLIC PANEL - Abnormal; Notable for the following components:      Result Value   Chloride 94 (*)    Glucose, Bld 105 (*)    BUN 97 (*)    Creatinine, Ser 3.90 (*)    Alkaline Phosphatase 25 (*)    GFR, Estimated 11 (*)    Anion gap 17 (*)    All other components within normal limits  CBC WITH DIFFERENTIAL/PLATELET - Abnormal; Notable for the following components:   RBC 3.39 (*)    Hemoglobin 10.0 (*)    HCT 31.8 (*)    Platelets 130 (*)    All other components within normal limits    EKG None  Radiology DG Foot Complete Right  Result Date: 01/04/2023 CLINICAL DATA:  Swelling and discomfort for a week. History of lymphedema. EXAM: RIGHT FOOT COMPLETE - 3 VIEW; RIGHT ANKLE - COMPLETE 3 VIEW COMPARISON:  None Available. FINDINGS:  Extensive swelling about the foot and ankle. Small well corticated plantar greater than Achilles calcaneal spurs. Underlying osteopenia. Slight degenerative changes along the interphalangeal joints. Mild degenerative changes of the hindfoot as well. No fracture or dislocation. No definite erosive changes. If there is further concern of potential infection, MRI or bone scan may be useful for further sensitivity as clinically appropriate. IMPRESSION: Severe soft tissue swelling. Osteopenia.  Mild degenerative changes.  Calcaneal spur Electronically Signed   By: Karen Kays M.D.   On: 01/04/2023 16:57   DG  Ankle Complete Right  Result Date: 01/04/2023 CLINICAL DATA:  Swelling and discomfort for a week. History of lymphedema. EXAM: RIGHT FOOT COMPLETE - 3 VIEW; RIGHT ANKLE - COMPLETE 3 VIEW COMPARISON:  None Available. FINDINGS: Extensive swelling about the foot and ankle. Small well corticated plantar greater than Achilles calcaneal spurs. Underlying osteopenia. Slight degenerative changes along the interphalangeal joints. Mild degenerative changes of the hindfoot as well. No fracture or dislocation. No definite erosive changes. If there is further concern of potential infection, MRI or bone scan may be useful for further sensitivity as clinically appropriate. IMPRESSION: Severe soft tissue swelling. Osteopenia.  Mild degenerative changes.  Calcaneal spur Electronically Signed   By: Karen Kays M.D.   On: 01/04/2023 16:57    Procedures Procedures    Medications Ordered in ED Medications  cephALEXin (KEFLEX) capsule 250 mg (250 mg Oral Given 01/04/23 1727)    ED Course/ Medical Decision Making/ A&P Clinical Course as of 01/04/23 2146  Tue Jan 04, 2023  1653 Xrays interpreted by me as no gross fracture no definite air in tissue.  Awaiting radiology reading. [MB]  1709 Patient has been compliant with her blood thinners so DVT less likely.  Labs showing AKI.  Patient refusing IV for any fluids or IV  antibiotics.  She said she would like to just get some antibiotics and follow-up with her doctor. [MB]    Clinical Course User Index [MB] Terrilee Files, MD                             Medical Decision Making Amount and/or Complexity of Data Reviewed Radiology: ordered.  Risk Prescription drug management.   This patient complains of right foot swelling and drainage; this involves an extensive number of treatment Options and is a complaint that carries with it a high risk of complications and morbidity. The differential includes peripheral edema, lymphedema, DVT, cellulitis, diabetic foot infection  I ordered, reviewed and interpreted labs, which included CBC with normal white count, stable low hemoglobin, chemistries with poor thinning of her CKD I ordered medication oral antibiotics and reviewed PMP when indicated. I ordered imaging studies which included x-ray of right foot and ankle and I independently    visualized and interpreted imaging which showed soft tissue swelling no definite bony destruction Additional history obtained from patient's companion Previous records obtained and reviewed in epic including recent podiatry notes  Social determinants considered, no significant barriers Critical Interventions: None  After the interventions stated above, I reevaluated the patient and found patient to be in no distress Admission and further testing considered, I feel she would benefit from placement of IV for IV hydration and IV antibiotics.  Patient is declining this at this time.  She just would like to start on oral antibiotics.  She does have a penicillin allergy but it looks like she has received cephalosporins in the past.  Will start her on Keflex and recommended close follow-up with her podiatry team.  Return instructions discussed         Final Clinical Impression(s) / ED Diagnoses Final diagnoses:  Cellulitis of right foot    Rx / DC Orders ED Discharge  Orders          Ordered    mupirocin ointment (BACTROBAN) 2 %  2 times daily        01/04/23 1708    cephALEXin (KEFLEX) 500 MG capsule  2 times daily  01/04/23 1708              Terrilee Files, MD 01/04/23 2148

## 2023-01-04 NOTE — ED Notes (Signed)
Discharge paperwork given and verbally understood. 

## 2023-01-05 ENCOUNTER — Ambulatory Visit: Payer: Medicare Other | Admitting: Occupational Therapy

## 2023-01-05 DIAGNOSIS — L03115 Cellulitis of right lower limb: Secondary | ICD-10-CM | POA: Diagnosis not present

## 2023-01-10 ENCOUNTER — Emergency Department (HOSPITAL_COMMUNITY): Payer: Medicare Other

## 2023-01-10 ENCOUNTER — Encounter (HOSPITAL_COMMUNITY): Payer: Self-pay | Admitting: General Surgery

## 2023-01-10 ENCOUNTER — Other Ambulatory Visit: Payer: Self-pay

## 2023-01-10 ENCOUNTER — Inpatient Hospital Stay (HOSPITAL_COMMUNITY)
Admission: EM | Admit: 2023-01-10 | Discharge: 2023-01-14 | DRG: 354 | Disposition: A | Discharge status: Skilled Nursing Facility | Payer: Medicare Other | Attending: Internal Medicine | Admitting: Internal Medicine

## 2023-01-10 DIAGNOSIS — E1121 Type 2 diabetes mellitus with diabetic nephropathy: Secondary | ICD-10-CM | POA: Diagnosis not present

## 2023-01-10 DIAGNOSIS — R600 Localized edema: Secondary | ICD-10-CM | POA: Diagnosis not present

## 2023-01-10 DIAGNOSIS — R1900 Intra-abdominal and pelvic swelling, mass and lump, unspecified site: Secondary | ICD-10-CM | POA: Diagnosis not present

## 2023-01-10 DIAGNOSIS — N2581 Secondary hyperparathyroidism of renal origin: Secondary | ICD-10-CM | POA: Diagnosis not present

## 2023-01-10 DIAGNOSIS — E8809 Other disorders of plasma-protein metabolism, not elsewhere classified: Secondary | ICD-10-CM | POA: Diagnosis present

## 2023-01-10 DIAGNOSIS — D631 Anemia in chronic kidney disease: Secondary | ICD-10-CM | POA: Diagnosis present

## 2023-01-10 DIAGNOSIS — Z79899 Other long term (current) drug therapy: Secondary | ICD-10-CM | POA: Diagnosis not present

## 2023-01-10 DIAGNOSIS — R17 Unspecified jaundice: Secondary | ICD-10-CM | POA: Diagnosis present

## 2023-01-10 DIAGNOSIS — Z803 Family history of malignant neoplasm of breast: Secondary | ICD-10-CM

## 2023-01-10 DIAGNOSIS — M1990 Unspecified osteoarthritis, unspecified site: Secondary | ICD-10-CM | POA: Diagnosis present

## 2023-01-10 DIAGNOSIS — K430 Incisional hernia with obstruction, without gangrene: Secondary | ICD-10-CM | POA: Diagnosis present

## 2023-01-10 DIAGNOSIS — Z96651 Presence of right artificial knee joint: Secondary | ICD-10-CM | POA: Diagnosis not present

## 2023-01-10 DIAGNOSIS — L03115 Cellulitis of right lower limb: Secondary | ICD-10-CM | POA: Diagnosis present

## 2023-01-10 DIAGNOSIS — L03116 Cellulitis of left lower limb: Secondary | ICD-10-CM | POA: Diagnosis not present

## 2023-01-10 DIAGNOSIS — I4819 Other persistent atrial fibrillation: Secondary | ICD-10-CM | POA: Diagnosis not present

## 2023-01-10 DIAGNOSIS — K5900 Constipation, unspecified: Secondary | ICD-10-CM | POA: Diagnosis not present

## 2023-01-10 DIAGNOSIS — N185 Chronic kidney disease, stage 5: Secondary | ICD-10-CM | POA: Diagnosis present

## 2023-01-10 DIAGNOSIS — R1084 Generalized abdominal pain: Secondary | ICD-10-CM | POA: Diagnosis not present

## 2023-01-10 DIAGNOSIS — K5730 Diverticulosis of large intestine without perforation or abscess without bleeding: Secondary | ICD-10-CM | POA: Diagnosis not present

## 2023-01-10 DIAGNOSIS — N179 Acute kidney failure, unspecified: Secondary | ICD-10-CM | POA: Diagnosis present

## 2023-01-10 DIAGNOSIS — R0609 Other forms of dyspnea: Secondary | ICD-10-CM | POA: Diagnosis not present

## 2023-01-10 DIAGNOSIS — E7800 Pure hypercholesterolemia, unspecified: Secondary | ICD-10-CM | POA: Diagnosis present

## 2023-01-10 DIAGNOSIS — I890 Lymphedema, not elsewhere classified: Secondary | ICD-10-CM | POA: Diagnosis present

## 2023-01-10 DIAGNOSIS — I872 Venous insufficiency (chronic) (peripheral): Secondary | ICD-10-CM | POA: Diagnosis not present

## 2023-01-10 DIAGNOSIS — E785 Hyperlipidemia, unspecified: Secondary | ICD-10-CM | POA: Diagnosis not present

## 2023-01-10 DIAGNOSIS — I132 Hypertensive heart and chronic kidney disease with heart failure and with stage 5 chronic kidney disease, or end stage renal disease: Secondary | ICD-10-CM | POA: Diagnosis present

## 2023-01-10 DIAGNOSIS — R1031 Right lower quadrant pain: Secondary | ICD-10-CM | POA: Diagnosis not present

## 2023-01-10 DIAGNOSIS — I4811 Longstanding persistent atrial fibrillation: Secondary | ICD-10-CM | POA: Diagnosis not present

## 2023-01-10 DIAGNOSIS — Z8249 Family history of ischemic heart disease and other diseases of the circulatory system: Secondary | ICD-10-CM

## 2023-01-10 DIAGNOSIS — I5030 Unspecified diastolic (congestive) heart failure: Secondary | ICD-10-CM | POA: Diagnosis present

## 2023-01-10 DIAGNOSIS — R109 Unspecified abdominal pain: Secondary | ICD-10-CM | POA: Diagnosis not present

## 2023-01-10 DIAGNOSIS — K922 Gastrointestinal hemorrhage, unspecified: Secondary | ICD-10-CM | POA: Diagnosis not present

## 2023-01-10 DIAGNOSIS — R2689 Other abnormalities of gait and mobility: Secondary | ICD-10-CM | POA: Diagnosis not present

## 2023-01-10 DIAGNOSIS — I83028 Varicose veins of left lower extremity with ulcer other part of lower leg: Secondary | ICD-10-CM | POA: Diagnosis not present

## 2023-01-10 DIAGNOSIS — I469 Cardiac arrest, cause unspecified: Secondary | ICD-10-CM | POA: Diagnosis not present

## 2023-01-10 DIAGNOSIS — Z4889 Encounter for other specified surgical aftercare: Secondary | ICD-10-CM | POA: Diagnosis not present

## 2023-01-10 DIAGNOSIS — K219 Gastro-esophageal reflux disease without esophagitis: Secondary | ICD-10-CM | POA: Diagnosis present

## 2023-01-10 DIAGNOSIS — R110 Nausea: Secondary | ICD-10-CM | POA: Diagnosis not present

## 2023-01-10 DIAGNOSIS — M898X9 Other specified disorders of bone, unspecified site: Secondary | ICD-10-CM | POA: Diagnosis present

## 2023-01-10 DIAGNOSIS — K439 Ventral hernia without obstruction or gangrene: Secondary | ICD-10-CM | POA: Diagnosis not present

## 2023-01-10 DIAGNOSIS — I10 Essential (primary) hypertension: Secondary | ICD-10-CM | POA: Diagnosis present

## 2023-01-10 DIAGNOSIS — Z880 Allergy status to penicillin: Secondary | ICD-10-CM

## 2023-01-10 DIAGNOSIS — Z881 Allergy status to other antibiotic agents status: Secondary | ICD-10-CM

## 2023-01-10 DIAGNOSIS — K432 Incisional hernia without obstruction or gangrene: Secondary | ICD-10-CM | POA: Diagnosis not present

## 2023-01-10 DIAGNOSIS — I5032 Chronic diastolic (congestive) heart failure: Secondary | ICD-10-CM | POA: Diagnosis present

## 2023-01-10 DIAGNOSIS — E559 Vitamin D deficiency, unspecified: Secondary | ICD-10-CM | POA: Diagnosis not present

## 2023-01-10 DIAGNOSIS — Z888 Allergy status to other drugs, medicaments and biological substances status: Secondary | ICD-10-CM | POA: Diagnosis not present

## 2023-01-10 DIAGNOSIS — Z01810 Encounter for preprocedural cardiovascular examination: Secondary | ICD-10-CM | POA: Diagnosis not present

## 2023-01-10 DIAGNOSIS — Z85828 Personal history of other malignant neoplasm of skin: Secondary | ICD-10-CM | POA: Diagnosis not present

## 2023-01-10 DIAGNOSIS — Z7401 Bed confinement status: Secondary | ICD-10-CM | POA: Diagnosis not present

## 2023-01-10 DIAGNOSIS — I081 Rheumatic disorders of both mitral and tricuspid valves: Secondary | ICD-10-CM | POA: Diagnosis not present

## 2023-01-10 DIAGNOSIS — K450 Other specified abdominal hernia with obstruction, without gangrene: Secondary | ICD-10-CM

## 2023-01-10 DIAGNOSIS — H9193 Unspecified hearing loss, bilateral: Secondary | ICD-10-CM | POA: Diagnosis not present

## 2023-01-10 DIAGNOSIS — E1122 Type 2 diabetes mellitus with diabetic chronic kidney disease: Secondary | ICD-10-CM | POA: Diagnosis present

## 2023-01-10 DIAGNOSIS — I422 Other hypertrophic cardiomyopathy: Secondary | ICD-10-CM | POA: Diagnosis not present

## 2023-01-10 DIAGNOSIS — I4891 Unspecified atrial fibrillation: Secondary | ICD-10-CM | POA: Diagnosis not present

## 2023-01-10 DIAGNOSIS — K436 Other and unspecified ventral hernia with obstruction, without gangrene: Secondary | ICD-10-CM

## 2023-01-10 DIAGNOSIS — M6281 Muscle weakness (generalized): Secondary | ICD-10-CM | POA: Diagnosis not present

## 2023-01-10 DIAGNOSIS — Z7901 Long term (current) use of anticoagulants: Secondary | ICD-10-CM | POA: Diagnosis not present

## 2023-01-10 DIAGNOSIS — D649 Anemia, unspecified: Secondary | ICD-10-CM | POA: Diagnosis not present

## 2023-01-10 DIAGNOSIS — N184 Chronic kidney disease, stage 4 (severe): Secondary | ICD-10-CM | POA: Diagnosis not present

## 2023-01-10 DIAGNOSIS — R609 Edema, unspecified: Secondary | ICD-10-CM | POA: Diagnosis not present

## 2023-01-10 DIAGNOSIS — L03119 Cellulitis of unspecified part of limb: Secondary | ICD-10-CM

## 2023-01-10 DIAGNOSIS — D62 Acute posthemorrhagic anemia: Secondary | ICD-10-CM | POA: Diagnosis not present

## 2023-01-10 LAB — COMPREHENSIVE METABOLIC PANEL
ALT: 13 U/L (ref 0–44)
AST: 26 U/L (ref 15–41)
Albumin: 3.5 g/dL (ref 3.5–5.0)
Alkaline Phosphatase: 28 U/L — ABNORMAL LOW (ref 38–126)
Anion gap: 15 (ref 5–15)
BUN: 104 mg/dL — ABNORMAL HIGH (ref 8–23)
CO2: 23 mmol/L (ref 22–32)
Calcium: 9.2 mg/dL (ref 8.9–10.3)
Chloride: 96 mmol/L — ABNORMAL LOW (ref 98–111)
Creatinine, Ser: 3.89 mg/dL — ABNORMAL HIGH (ref 0.44–1.00)
GFR, Estimated: 11 mL/min — ABNORMAL LOW (ref >60)
Glucose, Bld: 119 mg/dL — ABNORMAL HIGH (ref 70–99)
Potassium: 3.5 mmol/L (ref 3.5–5.1)
Sodium: 134 mmol/L — ABNORMAL LOW (ref 135–145)
Total Bilirubin: 0.8 mg/dL (ref 0.3–1.2)
Total Protein: 6.7 g/dL (ref 6.5–8.1)

## 2023-01-10 LAB — GLUCOSE, CAPILLARY
Glucose-Capillary: 111 mg/dL — ABNORMAL HIGH (ref 70–99)
Glucose-Capillary: 110 mg/dL — ABNORMAL HIGH (ref 70–99)
Glucose-Capillary: 107 mg/dL — ABNORMAL HIGH (ref 70–99)

## 2023-01-10 LAB — CBC WITH DIFFERENTIAL/PLATELET
Abs Immature Granulocytes: 0.02 10*3/uL (ref 0.00–0.07)
Basophils Absolute: 0 10*3/uL (ref 0.0–0.1)
Basophils Relative: 0 %
Eosinophils Absolute: 0.8 10*3/uL — ABNORMAL HIGH (ref 0.0–0.5)
Eosinophils Relative: 18 %
HCT: 30.7 % — ABNORMAL LOW (ref 36.0–46.0)
Hemoglobin: 9.9 g/dL — ABNORMAL LOW (ref 12.0–15.0)
Immature Granulocytes: 0 %
Lymphocytes Relative: 26 %
Lymphs Abs: 1.2 10*3/uL (ref 0.7–4.0)
MCH: 31 pg (ref 26.0–34.0)
MCHC: 32.2 g/dL (ref 30.0–36.0)
MCV: 96.2 fL (ref 80.0–100.0)
Monocytes Absolute: 0.4 10*3/uL (ref 0.1–1.0)
Monocytes Relative: 8 %
Neutro Abs: 2.2 10*3/uL (ref 1.7–7.7)
Neutrophils Relative %: 48 %
Platelets: 183 10*3/uL (ref 150–400)
RBC: 3.19 MIL/uL — ABNORMAL LOW (ref 3.87–5.11)
RDW: 14.5 % (ref 11.5–15.5)
WBC: 4.7 10*3/uL (ref 4.0–10.5)
nRBC: 0 % (ref 0.0–0.2)

## 2023-01-10 LAB — I-STAT CHEM 8, ED
BUN: 110 mg/dL — ABNORMAL HIGH (ref 8–23)
Calcium, Ion: 1.13 mmol/L — ABNORMAL LOW (ref 1.15–1.40)
Chloride: 97 mmol/L — ABNORMAL LOW (ref 98–111)
Creatinine, Ser: 4 mg/dL — ABNORMAL HIGH (ref 0.44–1.00)
Glucose, Bld: 118 mg/dL — ABNORMAL HIGH (ref 70–99)
HCT: 34 % — ABNORMAL LOW (ref 36.0–46.0)
Hemoglobin: 11.6 g/dL — ABNORMAL LOW (ref 12.0–15.0)
Potassium: 3.6 mmol/L (ref 3.5–5.1)
Sodium: 137 mmol/L (ref 135–145)
TCO2: 26 mmol/L (ref 22–32)

## 2023-01-10 LAB — LIPASE, BLOOD: Lipase: 44 U/L (ref 11–51)

## 2023-01-10 LAB — LACTIC ACID, PLASMA
Lactic Acid, Venous: 1.1 mmol/L (ref 0.5–1.9)
Lactic Acid, Venous: 1 mmol/L (ref 0.5–1.9)

## 2023-01-10 LAB — APTT: aPTT: 30 seconds (ref 24–36)

## 2023-01-10 LAB — MAGNESIUM: Magnesium: 1.4 mg/dL — ABNORMAL LOW (ref 1.7–2.4)

## 2023-01-10 LAB — HEPARIN LEVEL (UNFRACTIONATED): Heparin Unfractionated: 0.91 IU/mL — ABNORMAL HIGH (ref 0.30–0.70)

## 2023-01-10 LAB — PHOSPHORUS: Phosphorus: 5.2 mg/dL — ABNORMAL HIGH (ref 2.5–4.6)

## 2023-01-10 MED ORDER — FENTANYL CITRATE PF 50 MCG/ML IJ SOSY
50.0000 ug | PREFILLED_SYRINGE | Freq: Once | INTRAMUSCULAR | Status: AC
  Administered 2023-01-10: 50 ug via INTRAVENOUS
  Filled 2023-01-10: qty 1

## 2023-01-10 MED ORDER — METOPROLOL SUCCINATE ER 25 MG PO TB24
50.0000 mg | ORAL_TABLET | Freq: Every evening | ORAL | Status: DC
  Administered 2023-01-10 – 2023-01-13 (×4): 50 mg via ORAL
  Filled 2023-01-10 (×4): qty 2

## 2023-01-10 MED ORDER — INSULIN ASPART 100 UNIT/ML IJ SOLN
0.0000 [IU] | Freq: Three times a day (TID) | INTRAMUSCULAR | Status: DC
  Administered 2023-01-12: 1 [IU] via SUBCUTANEOUS

## 2023-01-10 MED ORDER — PANTOPRAZOLE SODIUM 40 MG PO TBEC
40.0000 mg | DELAYED_RELEASE_TABLET | Freq: Every day | ORAL | Status: DC
  Administered 2023-01-10 – 2023-01-14 (×4): 40 mg via ORAL
  Filled 2023-01-10 (×4): qty 1

## 2023-01-10 MED ORDER — ONDANSETRON HCL 4 MG/2ML IJ SOLN
4.0000 mg | Freq: Once | INTRAMUSCULAR | Status: AC
  Administered 2023-01-10: 4 mg via INTRAVENOUS
  Filled 2023-01-10: qty 2

## 2023-01-10 MED ORDER — HYDROCODONE-ACETAMINOPHEN 5-325 MG PO TABS
1.0000 | ORAL_TABLET | Freq: Four times a day (QID) | ORAL | Status: DC | PRN
  Administered 2023-01-10 – 2023-01-11 (×2): 1 via ORAL
  Filled 2023-01-10 (×2): qty 1

## 2023-01-10 MED ORDER — HEPARIN (PORCINE) 25000 UT/250ML-% IV SOLN
1200.0000 [IU]/h | INTRAVENOUS | Status: DC
  Administered 2023-01-10: 1000 [IU]/h via INTRAVENOUS
  Filled 2023-01-10 (×2): qty 250

## 2023-01-10 MED ORDER — ISOSORBIDE MONONITRATE ER 30 MG PO TB24
15.0000 mg | ORAL_TABLET | Freq: Every day | ORAL | Status: DC
  Administered 2023-01-10 – 2023-01-13 (×4): 15 mg via ORAL
  Filled 2023-01-10 (×4): qty 1

## 2023-01-10 MED ORDER — INSULIN ASPART 100 UNIT/ML IJ SOLN: 0.0000 [IU] | Freq: Every day | INTRAMUSCULAR | Status: DC

## 2023-01-10 MED ORDER — FUROSEMIDE 10 MG/ML IJ SOLN
80.0000 mg | Freq: Two times a day (BID) | INTRAMUSCULAR | Status: DC
  Administered 2023-01-10 – 2023-01-11 (×2): 80 mg via INTRAVENOUS
  Filled 2023-01-10 (×2): qty 8

## 2023-01-10 MED ORDER — SODIUM CHLORIDE 0.9 % IV SOLN
1.0000 g | Freq: Once | INTRAVENOUS | Status: AC
  Administered 2023-01-10: 1 g via INTRAVENOUS
  Filled 2023-01-10: qty 10

## 2023-01-10 NOTE — Progress Notes (Signed)
ANTICOAGULATION CONSULT NOTE - Initial Consult  Pharmacy Consult for heparin infusion Indication: atrial fibrillation  Allergies  Allergen Reactions   Other Swelling    Topical mycin, unsure of which one, caused opthalmic swelling  Erythromycin    Penicillins Shortness Of Breath, Itching, Swelling and Rash   Lovastatin Other (See Comments)    Weakness and myalgias, maybe to statins?   Azithromycin Other (See Comments)    Patient doesn't remember having a reaction to this.   Clindamycin Other (See Comments)    Patient doesn't remember having a reaction to this.    Hydralazine Other (See Comments)    Felt foggy   Lisinopril Other (See Comments)    Hyperkalemia    Neomycin Sulfate [Neomycin] Other (See Comments)    Redness and burning in face   Niacin Hives    Facial swelling and redness   Rofecoxib Other (See Comments)    Unknown reaction   Tetracycline    Valsartan Other (See Comments)    Hyperkalemia    Vytorin [Ezetimibe-Simvastatin] Other (See Comments)    myalgias   Zetia [Ezetimibe] Other (See Comments)    Leg pain    Patient Measurements: Height: 5\' 3"  (160 cm) Weight: 70.3 kg (155 lb) IBW/kg (Calculated) : 52.4 Heparin Dosing Weight: 66.9 kg  Vital Signs: Temp: 97.7 F (36.5 C) (06/24 1019) Temp Source: Oral (06/24 1019) BP: 122/52 (06/24 1200) Pulse Rate: 78 (06/24 1200)  Labs: Recent Labs    01/10/23 0639 01/10/23 0651  HGB 9.9* 11.6*  HCT 30.7* 34.0*  PLT 183  --   CREATININE 3.89* 4.00*    Estimated Creatinine Clearance: 9.3 mL/min (A) (by C-G formula based on SCr of 4 mg/dL (H)).   Medical History: Past Medical History:  Diagnosis Date   ABLA (acute blood loss anemia) 08/23/2022   CKD (chronic kidney disease)    Diabetes mellitus without complication (HCC)    type II   Gastrointestinal hemorrhage with melena 08/23/2022   GERD (gastroesophageal reflux disease)    High cholesterol    Hypertension    Osteoarthritis    Skin cancer,  basal cell     Medications:  (Not in a hospital admission)   Assessment: 89 yo F admitted with incarcerated RLQ incisional hernia. Pt has a history of Afib and takes apixaban 2.5mg  po BID at home. Last dose was 07:00 on 01/09/23. Surgery consulted for possible hernia repair on 01/12/23. Pharmacy consulted to dose heparin for Afib.  Hgb 11.6, Plt 183 No s/sx of bleeding  Goal of Therapy:  aPTT 66-102 seconds Monitor platelets by anticoagulation protocol: Yes   Plan:  No heparin bolus given recent apixaban administration Start heparin infusion at 1000 units/hr Check baseline aPTT/HL  Monitor aPTT in 8 hours Monitor daily CBC, aPTT and heparin level until correlating due to recent apixaban administration), and for s/sx of bleeding   Wilburn Cornelia, PharmD, BCPS Clinical Pharmacist 01/10/2023 12:43 PM   Please refer to [facility] for pharmacy phone number

## 2023-01-10 NOTE — Progress Notes (Signed)
Assisted pt off of bed pan. Pt cleaned up. Bed in lowest position. Call light in reach. Family at bedside. Will continue to monitor pt.

## 2023-01-10 NOTE — Progress Notes (Signed)
Pt requesting pain med. Messaged Ogbata,MD to let him know that patient need an order for something PRN for pain. Waiting on order to be put in. Pt made aware

## 2023-01-10 NOTE — Consult Note (Signed)
Consult Note  Tabitha Black August 24, 1932  [id number].    Requesting MD: Dr. Jeraldine Loots Chief Complaint/Reason for Consult: Incisional RLQ Hernia  HPI:  89 y.o. female with medical history significant for CKD, chronic lymphedema, atrial fibrillation, type II DM, GERD, hyperlipidemia, HTN, OA who presented to [facility] ED with right-sided abdominal pain.  She noted an area of swelling to her right abdomen starting 4 days ago that would intermittently reduce when she would lay down flat.  Over the last 2 days the swelling and pain have worsened.  She has had no nausea or vomiting and has been able to eat but since her hospitalization in February has had a poor appetite.  She had a normal BM yesterday.  She has had normal appetite and food intake.  Additionally, she has noticed some worsening leg swelling/redness over the past week in setting of known lymphedema.  She denies fever, chills, respiratory or urinary symptoms.  She was recently admitted 10/2022 for GI bleeding and underwent workup by GI including EGD and colonoscopy.  Workup in the ED significant for CT scan showing right-sided spigelian hernia containing a portion of the terminal ileum, cecum and proximal ascending colon without associated bowel obstruction.  She is being admitted to the hospitalist service for pain control and general surgery asked to consult in regard to hernia.  Currently, she tells me she continues to pass flatus.  Abdominal pain is still present.  She is not nauseous.  Substance use: Occasional alcohol use Allergies: Penicillins - SHOB, swelling Blood thinners: eliquis for atrial fibrillation, last dose last PM 01/09/23 Past Abdominal surgeries: Laparoscopic umbilical hernia repair with mesh - 2016, Dr. Derrell Lolling.  Cholecystectomy.  Appendectomy 1950s.  Abdominal hysterectomy   ROS: Reviewed and as above  Family History  Problem Relation Age of Onset   Heart failure Mother    Hypertension Mother    Heart attack  Father    Prostate cancer Father    Breast cancer Sister     Past Medical History:  Diagnosis Date   ABLA (acute blood loss anemia) 08/23/2022   CKD (chronic kidney disease)    Diabetes mellitus without complication (HCC)    type II   Gastrointestinal hemorrhage with melena 08/23/2022   GERD (gastroesophageal reflux disease)    High cholesterol    Hypertension    Osteoarthritis    Skin cancer, basal cell     Past Surgical History:  Procedure Laterality Date   ABDOMINAL HYSTERECTOMY N/A    APPENDECTOMY N/A 1953   BACK SURGERY     BIOPSY  08/25/2022   Procedure: BIOPSY;  Surgeon: Charlott Rakes, MD;  Location: [facility] ENDOSCOPY;  Service: Gastroenterology;;   CHOLECYSTECTOMY     COLONOSCOPY N/A 02/22/2008   COLONOSCOPY N/A 11/16/2022   Procedure: COLONOSCOPY;  Surgeon: Lynann Bologna, DO;  Location: [facility] ENDOSCOPY;  Service: Gastroenterology;  Laterality: N/A;   ESOPHAGOGASTRODUODENOSCOPY (EGD) WITH PROPOFOL N/A 08/25/2022   Procedure: ESOPHAGOGASTRODUODENOSCOPY (EGD) WITH PROPOFOL;  Surgeon: Charlott Rakes, MD;  Location: [facility] ENDOSCOPY;  Service: Gastroenterology;  Laterality: N/A;   ESOPHAGOGASTRODUODENOSCOPY (EGD) WITH PROPOFOL N/A 11/14/2022   Procedure: ESOPHAGOGASTRODUODENOSCOPY (EGD) WITH PROPOFOL;  Surgeon: Kathi Der, MD;  Location: [facility] ENDOSCOPY;  Service: Gastroenterology;  Laterality: N/A;   TOTAL KNEE ARTHROPLASTY Right    UMBILICAL HERNIA REPAIR N/A 04/02/2015   Procedure: LAPAROSCOPIC UMBILICAL HERNIA REPAIR WITH VENTRALIGHT MESH;  Surgeon: Axel Filler, MD;  Location: [facility] OR;  Service: General;  Laterality: N/A;    Social History:  reports that she has never smoked. She has never used smokeless tobacco. She reports current alcohol use. She reports that she does not use drugs.  Allergies:  Allergies  Allergen Reactions   Other Swelling    Topical mycin, unsure of which one, caused opthalmic swelling  Erythromycin    Penicillins Shortness Of Breath,  Itching, Swelling and Rash   Lovastatin Other (See Comments)    Weakness and myalgias, maybe to statins?   Azithromycin     Patient doesn't remember having a reaction to this.   Clindamycin     Patient doesn't remember having a reaction to this.    Hydralazine     Other reaction(s): felt foggy   Lisinopril Other (See Comments)    Hyperkalemia    Neomycin Sulfate [Neomycin] Other (See Comments)    Redness and burning in face   Niacin Hives    Facial swelling and redness   Rofecoxib Other (See Comments)    Unknown reaction   Tetracycline    Valsartan Other (See Comments)    Hyperkalemia    Vytorin [Ezetimibe-Simvastatin] Other (See Comments)    myalgias   Zetia [Ezetimibe] Other (See Comments)    Leg pain    (Not in a hospital admission)   Blood pressure (!) 121/54, pulse 73, temperature (!) 97.5 F (36.4 C), temperature source Oral, resp. rate 17, height 5\' 3"  (1.6 m), weight 70.3 kg, SpO2 98 %. Physical Exam: General: pleasant, WD, elderly, female who is laying in bed in NAD HEENT: head is normocephalic, atraumatic.  Sclera are noninjected.  Pupils equal and round. EOMs intact.  Ears and nose without any masses or lesions.  Mouth is pink and moist Heart: irregular but rate controlled.  Normal s1,s2. No obvious murmurs, gallops, or rubs noted. Lungs: CTAB, no wheezes, rhonchi, or rales noted.  Respiratory effort nonlabored Abd: soft, tender over RLQ hernia.  This is not able to be reduced, ND, +BS, no masses or organomegaly MSK: BLE with lymphedema and chronic skin changes with an open shallow wound on her R shin. Psych: A&Ox3 with an appropriate affect.    Results for orders placed or performed during the hospital encounter of 01/10/23 (from the past 48 hour(s))  CBC with Differential     Status: Abnormal   Collection Time: 01/10/23  6:39 AM  Result Value Ref Range   WBC 4.7 4.0 - 10.5 K/uL   RBC 3.19 (L) 3.87 - 5.11 MIL/uL   Hemoglobin 9.9 (L) 12.0 - 15.0 g/dL    HCT 09.8 (L) 11.9 - 46.0 %   MCV 96.2 80.0 - 100.0 fL   MCH 31.0 26.0 - 34.0 pg   MCHC 32.2 30.0 - 36.0 g/dL   RDW 14.7 82.9 - 56.2 %   Platelets 183 150 - 400 K/uL   nRBC 0.0 0.0 - 0.2 %   Neutrophils Relative % 48 %   Neutro Abs 2.2 1.7 - 7.7 K/uL   Lymphocytes Relative 26 %   Lymphs Abs 1.2 0.7 - 4.0 K/uL   Monocytes Relative 8 %   Monocytes Absolute 0.4 0.1 - 1.0 K/uL   Eosinophils Relative 18 %   Eosinophils Absolute 0.8 (H) 0.0 - 0.5 K/uL   Basophils Relative 0 %   Basophils Absolute 0.0 0.0 - 0.1 K/uL   Immature Granulocytes 0 %   Abs Immature Granulocytes 0.02 0.00 - 0.07 K/uL    Comment: Performed at [facility], [address], [place], [place] [address]  Comprehensive metabolic  panel     Status: Abnormal   Collection Time: 01/10/23  6:39 AM  Result Value Ref Range   Sodium 134 (L) 135 - 145 mmol/L   Potassium 3.5 3.5 - 5.1 mmol/L   Chloride 96 (L) 98 - 111 mmol/L   CO2 23 22 - 32 mmol/L   Glucose, Bld 119 (H) 70 - 99 mg/dL    Comment: Glucose reference range applies only to samples taken after fasting for at least 8 hours.   BUN 104 (H) 8 - 23 mg/dL   Creatinine, Ser 1.61 (H) 0.44 - 1.00 mg/dL   Calcium 9.2 8.9 - 09.6 mg/dL   Total Protein 6.7 6.5 - 8.1 g/dL   Albumin 3.5 3.5 - 5.0 g/dL   AST 26 15 - 41 U/L   ALT 13 0 - 44 U/L   Alkaline Phosphatase 28 (L) 38 - 126 U/L   Total Bilirubin 0.8 0.3 - 1.2 mg/dL   GFR, Estimated 11 (L) >60 mL/min    Comment: (NOTE) Calculated using the CKD-EPI Creatinine Equation (2021)    Anion gap 15 5 - 15    Comment: Performed at [facility], [address] [place], [place] [address]  Lipase, blood     Status: None   Collection Time: 01/10/23  6:39 AM  Result Value Ref Range   Lipase 44 11 - 51 U/L    Comment: Performed at [facility], [address], [place], [place] [address]  I-stat chem 8, ED (not at [facility], [facility] or [facility])     Status: Abnormal   Collection Time: 01/10/23  6:51 AM  Result Value Ref Range    Sodium 137 135 - 145 mmol/L   Potassium 3.6 3.5 - 5.1 mmol/L   Chloride 97 (L) 98 - 111 mmol/L   BUN 110 (H) 8 - 23 mg/dL   Creatinine, Ser 1.47 (H) 0.44 - 1.00 mg/dL   Glucose, Bld 829 (H) 70 - 99 mg/dL    Comment: Glucose reference range applies only to samples taken after fasting for at least 8 hours.   Calcium, Ion 1.13 (L) 1.15 - 1.40 mmol/L   TCO2 26 22 - 32 mmol/L   Hemoglobin 11.6 (L) 12.0 - 15.0 g/dL   HCT 56.2 (L) 13.0 - 86.5 %  Lactic acid, plasma     Status: None   Collection Time: 01/10/23  7:35 AM  Result Value Ref Range   Lactic Acid, Venous 1.1 0.5 - 1.9 mmol/L    Comment: Performed at [facility], [address] [place], [place] [address]   CT ABDOMEN PELVIS WO CONTRAST  Result Date: 01/10/2023 CLINICAL DATA:  89 year old female with history of right lower quadrant abdominal pain and mass. EXAM: CT ABDOMEN AND PELVIS WITHOUT CONTRAST TECHNIQUE: Multidetector CT imaging of the abdomen and pelvis was performed following the standard protocol without IV contrast. RADIATION DOSE REDUCTION: This exam was performed according to the departmental dose-optimization program which includes automated exposure control, adjustment of the mA and/or kV according to patient size and/or use of iterative reconstruction technique. COMPARISON:  CT of the abdomen and pelvis 11/12/2022. FINDINGS: Lower chest: Cardiomegaly. Atherosclerotic calcifications in the descending thoracic aorta. Calcifications of the mitral annulus. Hepatobiliary: Status post cholecystectomy. No definite suspicious cystic or solid hepatic lesions are confidently identified on today's noncontrast CT examination. Pancreas: No definite pancreatic mass or peripancreatic fluid collections or inflammatory changes are noted on today's noncontrast CT examination. Spleen: Unremarkable. Adrenals/Urinary Tract: Low position  and altered axis of the right kidney, similar to the prior study (normal variant). Low-attenuation lesions  in both kidneys, incompletely characterized on today's noncontrast CT examination, but similar to the prior study and likely to represent cysts (no imaging follow-up recommended), measuring up to 3 cm in the medial aspect of the left kidney. No hydroureteronephrosis. Small diverticulum measuring 2.7 cm in diameter extending off the left side of the urinary bladder (axial image 57 of series 3), similar to the prior study. Urinary bladder is otherwise unremarkable in appearance. Bilateral adrenal glands are normal in appearance. Stomach/Bowel: The unenhanced appearance of the stomach is normal. No pathologic dilatation of small bowel or colon. Right-sided Spigelian hernia containing the terminal ileum, cecum and proximal ascending colon. The appendix is not confidently identified and may be surgically absent. Regardless, there are no inflammatory changes noted adjacent to the cecum to suggest the presence of an acute appendicitis at this time. Numerous colonic diverticula are noted, without surrounding inflammatory changes to indicate an acute diverticulitis at this time. Vascular/Lymphatic: Atherosclerosis in the abdominal aorta and pelvic vasculature. No lymphadenopathy noted in the abdomen or pelvis. Reproductive: Status post hysterectomy. Ovaries are not confidently identified may be surgically absent or atrophic. Other: No significant volume of ascites.  No pneumoperitoneum. Musculoskeletal: There are no aggressive appearing lytic or blastic lesions noted in the visualized portions of the skeleton. Spinal fixation hardware in the lumbar spine incidentally noted. IMPRESSION: 1. Right-sided Spigelian hernia containing a portion of the terminal ileum, cecum and proximal ascending colon. No findings to suggest associated bowel obstruction at this time. This likely accounts for the palpable abdominal mass. 2. Colonic diverticulosis without evidence of acute diverticulitis at this time. 3. Aortic atherosclerosis. 4.  Cardiomegaly. 5. Additional incidental findings, as above. Electronically Signed   By: Trudie Reed M.D.   On: 01/10/2023 07:26      Assessment/Plan Incarcerated RLQ incisional hernia  Patient seen and examined and relevant labs and imaging personally reviewed.  Per imaging as well as clinically she is not currently obstructed, but she is incarcerated.  She is not peritonitic on exam and is hemodynamically stable without fever.  Given inability to reduce this and her pain, we can plan to fix this while she is here.  Recommend to hold Eliquis, may start on heparin gtt if needed.  Recommend cardiology consult to clear patient surgically.  She may have a regular diet for now in the setting of no obstruction.  We will follow along and plan for OR Wednesday if the patient is cleared and otherwise no acute changes.   FEN: may have a diet ID: Rocephin (cellulitis) VTE: hold Eliquis, may start heparin gtt if needed  Per primary CKD chronic lymphedema atrial fibrillation type II DM GERD Hyperlipidemia HTN OA  I reviewed ED provider notes, last 24 h vitals and pain scores, last 48 h intake and output, last 24 h labs and trends, and last 24 h imaging results.   Letha Cape, [facility] Surgery 01/10/2023, 9:20 AM Please see Amion for pager number during day hours 7:00am-4:30pm

## 2023-01-10 NOTE — ED Triage Notes (Signed)
Patient arrives from home via EMS for eval of constant abdominal pain. Pain is coming from a large mass in R abdomen that has become apparent only over the last two days. Also has weeping wounds to BLE. Fentanyl and 4mg  zofran given by EMS.

## 2023-01-10 NOTE — Progress Notes (Signed)
Lunch tray ordered 

## 2023-01-10 NOTE — H&P (Signed)
History and Physical  Tabitha Black:[id number] DOB: 07-20-1932 DOA: 01/10/2023  Referring physician: ER provider. PCP: Irven Coe, MD  Outpatient Specialists:    Patient coming from: Home.  Chief Complaint: Abdominal pain.  HPI:  Patient is an 89 year old female past medical history significant for appendicitis status post appendectomy about 50 years ago, abdominal hysterectomy, cholecystectomy, advanced chronic kidney disease (CKD 4/5-patient is known to Dr. Kathe Mariner), hypertension, atrial fibrillation on anticoagulation and hyperlipidemia.  Patient has had right-sided abdominal wall hernia (right-sided spigelian hernia) that has been readily reducible.  Patient presents with 1 week history of intermittent abdominal pain.  Abdominal pain became persistent trial last night, without associated nausea, vomiting, or change in bowel habits.  Patient continues to break the wind, and continues to have bowel movement.  Right-sided hernia has been nonreducible.  CT scan of the abdomen and pelvis without contrast done earlier today revealed right-sided spigelian hernia containing a portion of the terminal ileum, cecum and proximal ascending colon.  There were no findings to suggest associated bowel obstruction.  Colonic diverticulosis without evidence of acute diverticulitis was also reported.  Patient has chronic lymphedema of bilateral lower extremities with redness of the skin surrounding the lymphedema (possible cellulitis).  On presentation to the emergency room, temperature was 97.5, blood pressure 125/54 mmHg, heart rate of 86 bpm and respiratory rate of 18.  Chemistry reveals sodium of 137, potassium of 3.6, chloride of 97, CO2 23, BUN of 110 with serum creatinine of 4.  Phosphorus was 5.2 with magnesium of 1.4.  Iron panel revealed ferritin of 847 with saturation ratio 17%.  CBC revealed WBC of 4.7, hemoglobin of 9.9, hematocrit of 30.7, MCV of 96.2 with platelet count of 183.  Surgical team has  been consulted by the ER team to see the patient.  Surgery team plans to proceed with surgery in the next 48 hours.  ED Course: See above documentation. Pertinent labs: See above documentation. EKG: Independently reviewed.  Imaging: independently reviewed.   Review of Systems:  Negative for fever, visual changes, sore throat, rash, new muscle aches, chest pain, SOB, dysuria, bleeding, n/v.  Past Medical History:  Diagnosis Date   ABLA (acute blood loss anemia) 08/23/2022   CKD (chronic kidney disease)    Diabetes mellitus without complication (HCC)    type II   Gastrointestinal hemorrhage with melena 08/23/2022   GERD (gastroesophageal reflux disease)    High cholesterol    Hypertension    Osteoarthritis    Skin cancer, basal cell     Past Surgical History:  Procedure Laterality Date   ABDOMINAL HYSTERECTOMY N/A    APPENDECTOMY N/A 1953   BACK SURGERY     BIOPSY  08/25/2022   Procedure: BIOPSY;  Surgeon: Charlott Rakes, MD;  Location: [facility] ENDOSCOPY;  Service: Gastroenterology;;   CHOLECYSTECTOMY     COLONOSCOPY N/A 02/22/2008   COLONOSCOPY N/A 11/16/2022   Procedure: COLONOSCOPY;  Surgeon: Lynann Bologna, DO;  Location: [facility] ENDOSCOPY;  Service: Gastroenterology;  Laterality: N/A;   ESOPHAGOGASTRODUODENOSCOPY (EGD) WITH PROPOFOL N/A 08/25/2022   Procedure: ESOPHAGOGASTRODUODENOSCOPY (EGD) WITH PROPOFOL;  Surgeon: Charlott Rakes, MD;  Location: [facility] ENDOSCOPY;  Service: Gastroenterology;  Laterality: N/A;   ESOPHAGOGASTRODUODENOSCOPY (EGD) WITH PROPOFOL N/A 11/14/2022   Procedure: ESOPHAGOGASTRODUODENOSCOPY (EGD) WITH PROPOFOL;  Surgeon: Kathi Der, MD;  Location: [facility] ENDOSCOPY;  Service: Gastroenterology;  Laterality: N/A;   TOTAL KNEE ARTHROPLASTY Right    UMBILICAL HERNIA REPAIR N/A 04/02/2015   Procedure: LAPAROSCOPIC UMBILICAL HERNIA REPAIR WITH VENTRALIGHT MESH;  Surgeon: Axel Filler, MD;  Location: [facility] OR;  Service: General;  Laterality: N/A;     reports that  she has never smoked. She has never used smokeless tobacco. She reports current alcohol use. She reports that she does not use drugs.  Allergies  Allergen Reactions   Other Swelling    Topical mycin, unsure of which one, caused opthalmic swelling  Erythromycin    Penicillins Shortness Of Breath, Itching, Swelling and Rash   Lovastatin Other (See Comments)    Weakness and myalgias, maybe to statins?   Azithromycin     Patient doesn't remember having a reaction to this.   Clindamycin     Patient doesn't remember having a reaction to this.    Hydralazine     Other reaction(s): felt foggy   Lisinopril Other (See Comments)    Hyperkalemia    Neomycin Sulfate [Neomycin] Other (See Comments)    Redness and burning in face   Niacin Hives    Facial swelling and redness   Rofecoxib Other (See Comments)    Unknown reaction   Tetracycline    Valsartan Other (See Comments)    Hyperkalemia    Vytorin [Ezetimibe-Simvastatin] Other (See Comments)    myalgias   Zetia [Ezetimibe] Other (See Comments)    Leg pain    Family History  Problem Relation Age of Onset   Heart failure Mother    Hypertension Mother    Heart attack Father    Prostate cancer Father    Breast cancer Sister      Prior to Admission medications   Medication Sig Start Date End Date Taking? Authorizing Provider  acetaminophen (TYLENOL) 325 MG tablet Take 2 tablets (650 mg total) by mouth every 6 (six) hours as needed for mild pain (or Fever >/= 101). 08/27/22   Ghimire, Werner Lean, MD  apixaban (ELIQUIS) 2.5 MG TABS tablet Take 1 tablet (2.5 mg total) by mouth 2 (two) times daily. 06/08/22   Wendall Stade, MD  cephALEXin (KEFLEX) 500 MG capsule Take 1 capsule (500 mg total) by mouth 2 (two) times daily. 01/04/23   Terrilee Files, MD  Cholecalciferol (VITAMIN D3) 125 MCG (5000 UT) TABS Take 5,000 Units by mouth daily.    [provider]  cyclobenzaprine (FLEXERIL) 10 MG tablet Take 5 mg by mouth daily as  needed for muscle spasms.    [provider]  doxazosin (CARDURA) 4 MG tablet Take 4 mg by mouth daily.    [provider]  fenofibrate 160 MG tablet Take 160 mg by mouth daily. 10/08/22   [provider]  furosemide (LASIX) 80 MG tablet Take 80 mg by mouth 2 (two) times daily.    [provider]  guaiFENesin 200 MG tablet Take 1 tablet (200 mg total) by mouth every 4 (four) hours as needed for cough or to loosen phlegm. 10/06/22   Danford, Earl Lites, MD  HYDROcodone-acetaminophen (NORCO) 10-325 MG tablet Take 1 tablet by mouth at bedtime.    [provider]  isosorbide mononitrate (IMDUR) 30 MG 24 hr tablet Take 1/2 tablet (15 mg) by mouth at bedtime. 10/06/22   Danford, Earl Lites, MD  metolazone (ZAROXOLYN) 5 MG tablet Take 5 mg by mouth daily. For 30 days    [provider]  metoprolol succinate (TOPROL-XL) 50 MG 24 hr tablet Take 1 tablet (50 mg total) by mouth every evening. Take with or immediately following a meal. 11/16/22   Willeen Niece, MD  Multiple Vitamins-Minerals (HAIR  SKIN AND NAILS FORMULA) TABS Take 1 tablet by mouth daily.    [provider]  mupirocin ointment (BACTROBAN) 2 % Apply topically 2 (two) times daily. 01/04/23   Terrilee Files, MD  pantoprazole (PROTONIX) 40 MG tablet Take 40 mg by mouth daily. 12/28/22   [provider]  potassium chloride SA (KLOR-CON M) 20 MEQ tablet Take 1 tablet (20 mEq) by mouth daily. 10/06/22   Alberteen Sam, MD    Physical Exam: Vitals:   01/10/23 0930 01/10/23 1000 01/10/23 1019 01/10/23 1100  BP: (!) 134/57 133/61  (!) 121/57  Pulse: 84 87  73  Resp: 16 17  18   Temp:   97.7 F (36.5 C)   TempSrc:   Oral   SpO2: 98% 98%  97%  Weight:      Height:        Constitutional:  Appears calm and comfortable.  Patient is not in any distress.  Bilateral lower extremity lymphedema with cellulitic changes Eyes:  Patient is pale.  No jaundice.  ENMT:   external ears, nose appear normal Neck:  Neck is supple. No JVD Respiratory:  CTA bilaterally, no w/r/r.  Respiratory effort normal. No retractions or accessory muscle use Cardiovascular:  S1S2, irregular, with soft systolic murmur. Bilateral lower extremity lymphedema   Abdomen:  Right to mid lower abdominal wall mass (likely incarcerated hernia).   Neurologic:  Awake and alert. Moves all limbs.  Wt Readings from Last 3 Encounters:  01/10/23 70.3 kg  01/04/23 69.9 kg  11/22/22 70.8 kg    I have personally reviewed following labs and imaging studies  Labs on Admission:  CBC: Recent Labs  Lab 01/04/23 1242 01/04/23 1346 01/10/23 0639 01/10/23 0651  WBC  --  4.3 4.7  --   NEUTROABS  --  2.6 2.2  --   HGB 9.8* 10.0* 9.9* 11.6*  HCT  --  31.8* 30.7* 34.0*  MCV  --  93.8 96.2  --   PLT  --  130* 183  --    Basic Metabolic Panel: Recent Labs  Lab 01/04/23 1346 01/10/23 0639 01/10/23 0651  NA 137 134* 137  K 3.7 3.5 3.6  CL 94* 96* 97*  CO2 26 23  --   GLUCOSE 105* 119* 118*  BUN 97* 104* 110*  CREATININE 3.90* 3.89* 4.00*  CALCIUM 10.2 9.2  --    Liver Function Tests: Recent Labs  Lab 01/04/23 1346 01/10/23 0639  AST 26 26  ALT 9 13  ALKPHOS 25* 28*  BILITOT 1.1 0.8  PROT 7.6 6.7  ALBUMIN 4.2 3.5   Recent Labs  Lab 01/10/23 0639  LIPASE 44   No results for input(s): "AMMONIA" in the last 168 hours. Coagulation Profile: No results for input(s): "INR", "PROTIME" in the last 168 hours. Cardiac Enzymes: No results for input(s): "CKTOTAL", "CKMB", "CKMBINDEX", "TROPONINI" in the last 168 hours. BNP (last 3 results) No results for input(s): "PROBNP" in the last 8760 hours. HbA1C: No results for input(s): "HGBA1C" in the last 72 hours. CBG: No results for input(s): "GLUCAP" in the last 168 hours. Lipid Profile: No results for input(s): "CHOL", "HDL", "LDLCALC", "TRIG", "CHOLHDL", "LDLDIRECT" in the last 72 hours. Thyroid Function Tests: No  results for input(s): "TSH", "T4TOTAL", "FREET4", "T3FREE", "THYROIDAB" in the last 72 hours. Anemia Panel: No results for input(s): "VITAMINB12", "FOLATE", "FERRITIN", "TIBC", "IRON", "RETICCTPCT" in the last 72 hours. Urine analysis:    Component Value Date/Time   COLORURINE YELLOW 11/23/2022 1232  APPEARANCEUR CLEAR 11/23/2022 1232   LABSPEC 1.008 11/23/2022 1232   PHURINE 5.0 11/23/2022 1232   GLUCOSEU NEGATIVE 11/23/2022 1232   HGBUR NEGATIVE 11/23/2022 1232   BILIRUBINUR NEGATIVE 11/23/2022 1232   KETONESUR NEGATIVE 11/23/2022 1232   PROTEINUR NEGATIVE 11/23/2022 1232   UROBILINOGEN 1.0 04/01/2015 0425   NITRITE NEGATIVE 11/23/2022 1232   LEUKOCYTESUR NEGATIVE 11/23/2022 1232   Sepsis Labs: @LABRCNTIP (procalcitonin:4,lacticidven:4) )No results found for this or any previous visit (from the past 240 hour(s)).    Radiological Exams on Admission: CT ABDOMEN PELVIS WO CONTRAST  Result Date: 01/10/2023 CLINICAL DATA:  89 year old female with history of right lower quadrant abdominal pain and mass. EXAM: CT ABDOMEN AND PELVIS WITHOUT CONTRAST TECHNIQUE: Multidetector CT imaging of the abdomen and pelvis was performed following the standard protocol without IV contrast. RADIATION DOSE REDUCTION: This exam was performed according to the departmental dose-optimization program which includes automated exposure control, adjustment of the mA and/or kV according to patient size and/or use of iterative reconstruction technique. COMPARISON:  CT of the abdomen and pelvis 11/12/2022. FINDINGS: Lower chest: Cardiomegaly. Atherosclerotic calcifications in the descending thoracic aorta. Calcifications of the mitral annulus. Hepatobiliary: Status post cholecystectomy. No definite suspicious cystic or solid hepatic lesions are confidently identified on today's noncontrast CT examination. Pancreas: No definite pancreatic mass or peripancreatic fluid collections or inflammatory changes are noted on today's  noncontrast CT examination. Spleen: Unremarkable. Adrenals/Urinary Tract: Low position and altered axis of the right kidney, similar to the prior study (normal variant). Low-attenuation lesions in both kidneys, incompletely characterized on today's noncontrast CT examination, but similar to the prior study and likely to represent cysts (no imaging follow-up recommended), measuring up to 3 cm in the medial aspect of the left kidney. No hydroureteronephrosis. Small diverticulum measuring 2.7 cm in diameter extending off the left side of the urinary bladder (axial image 57 of series 3), similar to the prior study. Urinary bladder is otherwise unremarkable in appearance. Bilateral adrenal glands are normal in appearance. Stomach/Bowel: The unenhanced appearance of the stomach is normal. No pathologic dilatation of small bowel or colon. Right-sided Spigelian hernia containing the terminal ileum, cecum and proximal ascending colon. The appendix is not confidently identified and may be surgically absent. Regardless, there are no inflammatory changes noted adjacent to the cecum to suggest the presence of an acute appendicitis at this time. Numerous colonic diverticula are noted, without surrounding inflammatory changes to indicate an acute diverticulitis at this time. Vascular/Lymphatic: Atherosclerosis in the abdominal aorta and pelvic vasculature. No lymphadenopathy noted in the abdomen or pelvis. Reproductive: Status post hysterectomy. Ovaries are not confidently identified may be surgically absent or atrophic. Other: No significant volume of ascites.  No pneumoperitoneum. Musculoskeletal: There are no aggressive appearing lytic or blastic lesions noted in the visualized portions of the skeleton. Spinal fixation hardware in the lumbar spine incidentally noted. IMPRESSION: 1. Right-sided Spigelian hernia containing a portion of the terminal ileum, cecum and proximal ascending colon. No findings to suggest associated bowel  obstruction at this time. This likely accounts for the palpable abdominal mass. 2. Colonic diverticulosis without evidence of acute diverticulitis at this time. 3. Aortic atherosclerosis. 4. Cardiomegaly. 5. Additional incidental findings, as above. Electronically Signed   By: Trudie Reed M.D.   On: 01/10/2023 07:26    EKG: Independently reviewed.   Principal Problem:   Abdominal pain   Assessment/Plan Incarcerated abdominal wall hernia: -Hospitalist team has been asked to admit patient. -Surgical team has consulted on the patient, and surgery is planned  in the next 48 hours. -Hold Eliquis.  Start heparin.  Patient has atrial fibrillation. -Cardiology team has been consulted for preoperative evaluation. -Adequate analgesia. -Continue to assess and manage expectantly.  Bilateral lower extremity lymphedema with possible cellulitis: -Continue IV Rocephin.  CKD stage V: -Patient is known to [place] kidney disease (Dr. Kathe Mariner). -Have a low threshold to consult the nephrology team. -No indication for renal replacement therapy at this time. -Avoid nephrotoxins. -Dose all medications, assuming GFR of less than 10 mL/min. -Keep MAP greater than 65 mmHg.  CKD-MBD: -Phosphorus is 5.4. -Will defer to the nephrology team. -Have low threshold to start low Renvela.  Anemia: -Likely anemia of chronic kidney disease versus multifactorial anemia. -Current hemoglobin is at goal. -Ferritin is 847 (component of acute phase reaction). -ESA as deemed appropriate by the nephrology team.  Atrial fibrillation on anticoagulation: -Hold Eliquis. -Start heparin drip. -Controlled rate.  Hypertension: -Continue to optimize. -Goal blood pressure should be less than 130/80 mmHg.   DVT prophylaxis: Heparin drip Code Status: Full code Family Communication:  Disposition Plan: This will depend on hospital course Consults called: Surgery.  Cardiology.  Have a low threshold to consult  nephrology. Admission status: Inpatient.  Time spent: 85 minutes.    Berton Mount, MD  [facility] Pager #: [phone] 7PM-7AM contact night coverage as above  01/10/2023, 11:58 AM

## 2023-01-10 NOTE — ED Notes (Signed)
Patient transported to CT 

## 2023-01-10 NOTE — ED Notes (Signed)
Please update daughter [phone]

## 2023-01-10 NOTE — ED Notes (Signed)
ED TO INPATIENT HANDOFF REPORT  ED Nurse Name and Phone #: Jesse Sans. [phone]  S Name/Age/Gender Tabitha Black 89 y.o. female Room/Bed: 033C/033C  Code Status   Code Status: Full Code  Home/SNF/Other Home Patient oriented to: self, place, time, and situation Is this baseline? Yes   Triage Complete: Triage complete  Chief Complaint Abdominal pain [R10.9]  Triage Note Patient arrives from home via EMS for eval of constant abdominal pain. Pain is coming from a large mass in R abdomen that has become apparent only over the last two days. Also has weeping wounds to BLE. Fentanyl and 4mg  zofran given by EMS.    Allergies Allergies  Allergen Reactions   Other Swelling    Topical mycin, unsure of which one, caused opthalmic swelling  Erythromycin    Penicillins Shortness Of Breath, Itching, Swelling and Rash   Lovastatin Other (See Comments)    Weakness and myalgias, maybe to statins?   Azithromycin     Patient doesn't remember having a reaction to this.   Clindamycin     Patient doesn't remember having a reaction to this.    Hydralazine     Other reaction(s): felt foggy   Lisinopril Other (See Comments)    Hyperkalemia    Neomycin Sulfate [Neomycin] Other (See Comments)    Redness and burning in face   Niacin Hives    Facial swelling and redness   Rofecoxib Other (See Comments)    Unknown reaction   Tetracycline    Valsartan Other (See Comments)    Hyperkalemia    Vytorin [Ezetimibe-Simvastatin] Other (See Comments)    myalgias   Zetia [Ezetimibe] Other (See Comments)    Leg pain    Level of Care/Admitting Diagnosis ED Disposition     ED Disposition  Admit   Condition  --   Comment  Hospital Area: [facility] [[id number]]  Level of Care: Telemetry Medical [104]  May admit patient to [facility] or [facility] if equivalent level of care is available:: No  Covid Evaluation: Asymptomatic - no recent exposure (last 10 days) testing not  required  Diagnosis: Abdominal pain [454098]  Admitting Physician: Barnetta Chapel [[id number]]  Attending Physician: Berton Mount I [[id number]]  Certification:: I certify this patient will need inpatient services for at least 2 midnights  Estimated Length of Stay: 4          B Medical/Surgery History Past Medical History:  Diagnosis Date   ABLA (acute blood loss anemia) 08/23/2022   CKD (chronic kidney disease)    Diabetes mellitus without complication (HCC)    type II   Gastrointestinal hemorrhage with melena 08/23/2022   GERD (gastroesophageal reflux disease)    High cholesterol    Hypertension    Osteoarthritis    Skin cancer, basal cell    Past Surgical History:  Procedure Laterality Date   ABDOMINAL HYSTERECTOMY N/A    APPENDECTOMY N/A 1953   BACK SURGERY     BIOPSY  08/25/2022   Procedure: BIOPSY;  Surgeon: Charlott Rakes, MD;  Location: [facility] ENDOSCOPY;  Service: Gastroenterology;;   CHOLECYSTECTOMY     COLONOSCOPY N/A 02/22/2008   COLONOSCOPY N/A 11/16/2022   Procedure: COLONOSCOPY;  Surgeon: Lynann Bologna, DO;  Location: [facility] ENDOSCOPY;  Service: Gastroenterology;  Laterality: N/A;   ESOPHAGOGASTRODUODENOSCOPY (EGD) WITH PROPOFOL N/A 08/25/2022   Procedure: ESOPHAGOGASTRODUODENOSCOPY (EGD) WITH PROPOFOL;  Surgeon: Charlott Rakes, MD;  Location[facility] ENDOSCOPY;  Service: Gastroenterology;  Laterality: N/A;   ESOPHAGOGASTRODUODENOSCOPY (EGD)  WITH PROPOFOL N/A 11/14/2022   Procedure: ESOPHAGOGASTRODUODENOSCOPY (EGD) WITH PROPOFOL;  Surgeon: Kathi Der, MD;  Location: [facility] ENDOSCOPY;  Service: Gastroenterology;  Laterality: N/A;   TOTAL KNEE ARTHROPLASTY Right    UMBILICAL HERNIA REPAIR N/A 04/02/2015   Procedure: LAPAROSCOPIC UMBILICAL HERNIA REPAIR WITH VENTRALIGHT MESH;  Surgeon: Axel Filler, MD;  Location: [facility] OR;  Service: General;  Laterality: N/A;     A IV Location/Drains/Wounds Patient Lines/Drains/Airways Status     Active Line/Drains/Airways      Name Placement date Placement time Site Days   Peripheral IV 01/10/23 20 G Left;Posterior Forearm 01/10/23  0634  Forearm  less than 1            Intake/Output Last 24 hours No intake or output data in the 24 hours ending 01/10/23 1205  Labs/Imaging Results for orders placed or performed during the hospital encounter of 01/10/23 (from the past 48 hour(s))  CBC with Differential     Status: Abnormal   Collection Time: 01/10/23  6:39 AM  Result Value Ref Range   WBC 4.7 4.0 - 10.5 K/uL   RBC 3.19 (L) 3.87 - 5.11 MIL/uL   Hemoglobin 9.9 (L) 12.0 - 15.0 g/dL   HCT 84.1 (L) 32.4 - 40.1 %   MCV 96.2 80.0 - 100.0 fL   MCH 31.0 26.0 - 34.0 pg   MCHC 32.2 30.0 - 36.0 g/dL   RDW 02.7 25.3 - 66.4 %   Platelets 183 150 - 400 K/uL   nRBC 0.0 0.0 - 0.2 %   Neutrophils Relative % 48 %   Neutro Abs 2.2 1.7 - 7.7 K/uL   Lymphocytes Relative 26 %   Lymphs Abs 1.2 0.7 - 4.0 K/uL   Monocytes Relative 8 %   Monocytes Absolute 0.4 0.1 - 1.0 K/uL   Eosinophils Relative 18 %   Eosinophils Absolute 0.8 (H) 0.0 - 0.5 K/uL   Basophils Relative 0 %   Basophils Absolute 0.0 0.0 - 0.1 K/uL   Immature Granulocytes 0 %   Abs Immature Granulocytes 0.02 0.00 - 0.07 K/uL    Comment: Performed at [facility], [address], [place], [place] [address]  Comprehensive metabolic panel     Status: Abnormal   Collection Time: 01/10/23  6:39 AM  Result Value Ref Range   Sodium 134 (L) 135 - 145 mmol/L   Potassium 3.5 3.5 - 5.1 mmol/L   Chloride 96 (L) 98 - 111 mmol/L   CO2 23 22 - 32 mmol/L   Glucose, Bld 119 (H) 70 - 99 mg/dL    Comment: Glucose reference range applies only to samples taken after fasting for at least 8 hours.   BUN 104 (H) 8 - 23 mg/dL   Creatinine, Ser 4.25 (H) 0.44 - 1.00 mg/dL   Calcium 9.2 8.9 - 95.6 mg/dL   Total Protein 6.7 6.5 - 8.1 g/dL   Albumin 3.5 3.5 - 5.0 g/dL   AST 26 15 - 41 U/L   ALT 13 0 - 44 U/L   Alkaline Phosphatase 28 (L) 38 - 126 U/L   Total Bilirubin 0.8  0.3 - 1.2 mg/dL   GFR, Estimated 11 (L) >60 mL/min    Comment: (NOTE) Calculated using the CKD-EPI Creatinine Equation (2021)    Anion gap 15 5 - 15    Comment: Performed at [facility], [address], [place], [place] [address]  Lipase, blood     Status: None   Collection Time: 01/10/23  6:39 AM  Result Value Ref Range   Lipase 44 11 - 51 U/L    Comment: Performed at [facility], [address] [place], [place] [address]  I-stat chem 8, ED (not at [facility] [place] [facility], [facility] or [facility])     Status: Abnormal   Collection Time: 01/10/23  6:51 AM  Result Value Ref Range   Sodium 137 135 - 145 mmol/L   Potassium 3.6 3.5 - 5.1 mmol/L   Chloride 97 (L) 98 - 111 mmol/L   BUN 110 (H) 8 - 23 mg/dL   Creatinine, Ser 6.04 (H) 0.44 - 1.00 mg/dL   Glucose, Bld 540 (H) 70 - 99 mg/dL    Comment: Glucose reference range applies only to samples taken after fasting for at least 8 hours.   Calcium, Ion 1.13 (L) 1.15 - 1.40 mmol/L   TCO2 26 22 - 32 mmol/L   Hemoglobin 11.6 (L) 12.0 - 15.0 g/dL   HCT 98.1 (L) 19.1 - 47.8 %  Lactic acid, plasma     Status: None   Collection Time: 01/10/23  7:35 AM  Result Value Ref Range   Lactic Acid, Venous 1.1 0.5 - 1.9 mmol/L    Comment: Performed at [facility], [address] [place], [place] [address]   CT ABDOMEN PELVIS WO CONTRAST  Result Date: 01/10/2023 CLINICAL DATA:  89 year old female with history of right lower quadrant abdominal pain and mass. EXAM: CT ABDOMEN AND PELVIS WITHOUT CONTRAST TECHNIQUE: Multidetector CT imaging of the abdomen and pelvis was performed following the standard protocol without IV contrast. RADIATION DOSE REDUCTION: This exam was performed according to the departmental dose-optimization program which includes automated exposure control, adjustment of the mA and/or kV according to patient size and/or use of iterative reconstruction technique. COMPARISON:  CT of the abdomen and pelvis 11/12/2022. FINDINGS: Lower chest:  Cardiomegaly. Atherosclerotic calcifications in the descending thoracic aorta. Calcifications of the mitral annulus. Hepatobiliary: Status post cholecystectomy. No definite suspicious cystic or solid hepatic lesions are confidently identified on today's noncontrast CT examination. Pancreas: No definite pancreatic mass or peripancreatic fluid collections or inflammatory changes are noted on today's noncontrast CT examination. Spleen: Unremarkable. Adrenals/Urinary Tract: Low position and altered axis of the right kidney, similar to the prior study (normal variant). Low-attenuation lesions in both kidneys, incompletely characterized on today's noncontrast CT examination, but similar to the prior study and likely to represent cysts (no imaging follow-up recommended), measuring up to 3 cm in the medial aspect of the left kidney. No hydroureteronephrosis. Small diverticulum measuring 2.7 cm in diameter extending off the left side of the urinary bladder (axial image 57 of series 3), similar to the prior study. Urinary bladder is otherwise unremarkable in appearance. Bilateral adrenal glands are normal in appearance. Stomach/Bowel: The unenhanced appearance of the stomach is normal. No pathologic dilatation of small bowel or colon. Right-sided Spigelian hernia containing the terminal ileum, cecum and proximal ascending colon. The appendix is not confidently identified and may be surgically absent. Regardless, there are no inflammatory changes noted adjacent to the cecum to suggest the presence of an acute appendicitis at this time. Numerous colonic diverticula are noted, without surrounding inflammatory changes to indicate an acute diverticulitis at this time. Vascular/Lymphatic: Atherosclerosis in the abdominal aorta and pelvic vasculature. No lymphadenopathy noted in the abdomen or pelvis. Reproductive: Status post hysterectomy. Ovaries are not confidently identified may be surgically absent or atrophic. Other: No  significant volume of ascites.  No pneumoperitoneum. Musculoskeletal: There are no aggressive  appearing lytic or blastic lesions noted in the visualized portions of the skeleton. Spinal fixation hardware in the lumbar spine incidentally noted. IMPRESSION: 1. Right-sided Spigelian hernia containing a portion of the terminal ileum, cecum and proximal ascending colon. No findings to suggest associated bowel obstruction at this time. This likely accounts for the palpable abdominal mass. 2. Colonic diverticulosis without evidence of acute diverticulitis at this time. 3. Aortic atherosclerosis. 4. Cardiomegaly. 5. Additional incidental findings, as above. Electronically Signed   By: Trudie Reed M.D.   On: 01/10/2023 07:26    Pending Labs Unresulted Labs (From admission, onward)     Start     Ordered   01/10/23 0646  Lactic acid, plasma  Now then every 2 hours,   R      01/10/23 0645   01/10/23 0635  Urinalysis, w/ Reflex to Culture (Infection Suspected) -Urine, Clean Catch  Once,   URGENT       Question:  Specimen Source  Answer:  Urine, Clean Catch   01/10/23 0634   Signed and Held  Magnesium  Once,   R        Signed and Held   Signed and Held  Phosphorus  Once,   R        Signed and Held   Signed and Held  Urinalysis, Routine w reflex microscopic -Urine, Clean Catch  Once,   R       Question:  Specimen Source  Answer:  Urine, Financial risk analyst and Held   Signed and Held  Comprehensive metabolic panel  Tomorrow morning,   R        Signed and Held   Signed and Held  CBC  Tomorrow morning,   R        Signed and Held            Vitals/Pain Today's Vitals   01/10/23 1000 01/10/23 1019 01/10/23 1100 01/10/23 1200  BP: 133/61  (!) 121/57 (!) 122/52  Pulse: 87  73 78  Resp: 17  18 13   Temp:  97.7 F (36.5 C)    TempSrc:  Oral    SpO2: 98%  97% 98%  Weight:      Height:      PainSc:        Isolation Precautions No active isolations  Medications Medications  fentaNYL  (SUBLIMAZE) injection 50 mcg (50 mcg Intravenous Given 01/10/23 0734)  ondansetron (ZOFRAN) injection 4 mg (4 mg Intravenous Given 01/10/23 0734)  cefTRIAXone (ROCEPHIN) 1 g in sodium chloride 0.9 % 100 mL IVPB (0 g Intravenous Stopped 01/10/23 1114)    Mobility walks with person assist     Focused Assessments Neuro Assessment Handoff:  Swallow screen pass? Yes          Neuro Assessment:   Neuro Checks:      Has TPA been given? No If patient is a Neuro Trauma and patient is going to OR before floor call report to 4N Charge nurse: [phone] or [phone]   R Recommendations: See Admitting Provider Note  Report given to:   Additional Notes: pt is AAOx4. Pt is on room air.

## 2023-01-10 NOTE — Progress Notes (Signed)
Pt arrived to 6 north room 29 alert and oriented. Heparin drip not started yet, lab in room currently collecting blood for hep level. Pain level 0/10. Bed in lowest position. Call light in reach. Will continue to monitor pt

## 2023-01-10 NOTE — ED Provider Notes (Signed)
[place] EMERGENCY DEPARTMENT AT [facility] Provider Note   CSN: [id number] Arrival date & time: 01/10/23  4098     History  Chief Complaint  Patient presents with   Abdominal Pain    Tabitha Black is a 89 y.o. female.   Abdominal Pain   89 year old female presents hospital complaints of right-sided abdominal pain for the past.  Patient states that she initially noted area 4 days ago but states that it intermittently "go back down" when she would lay down flat.  States that over the past 2 days, mass and pain worsened/became persistent prompting visit to the emergency department today.  Denies history of similar symptoms in the past.  Reports feelings of nausea with no vomiting.  Denies fever, chills, night sweats, urinary symptoms, vaginal symptoms.  Patient states that her last bowel movement was yesterday and regular per patient.  Patient also states she has had worsening leg swelling/redness over the past week with right greater than left.  States that symptoms acutely worsened over the past 2 days with worsening of lower extremity pain as well as redness.  States that both of her legs have been oozing more frequently over the past 2 days.  States has a history of lower leg infection requiring admission in the past.  Past medical history significant for CKD, GI hemorrhage with melena, GERD, hypercholesterolemia, hypertension, OA, heart failure with preserved ejection fraction, GERD, obesity, persistent atrial fibrillation, lymphedema  Home Medications Prior to Admission medications   Medication Sig Start Date End Date Taking? Authorizing Provider  apixaban (ELIQUIS) 2.5 MG TABS tablet Take 1 tablet (2.5 mg total) by mouth 2 (two) times daily. 06/08/22  Yes Wendall Stade, MD  acetaminophen (TYLENOL) 325 MG tablet Take 2 tablets (650 mg total) by mouth every 6 (six) hours as needed for mild pain (or Fever >/= 101). 08/27/22   Ghimire, Werner Lean, MD  cephALEXin (KEFLEX) 500  MG capsule Take 1 capsule (500 mg total) by mouth 2 (two) times daily. 01/04/23   Terrilee Files, MD  Cholecalciferol (VITAMIN D3) 125 MCG (5000 UT) TABS Take 5,000 Units by mouth daily.    [provider]  cyclobenzaprine (FLEXERIL) 10 MG tablet Take 5 mg by mouth daily as needed for muscle spasms.    [provider]  doxazosin (CARDURA) 4 MG tablet Take 4 mg by mouth daily.    [provider]  fenofibrate 160 MG tablet Take 160 mg by mouth daily. 10/08/22   [provider]  furosemide (LASIX) 80 MG tablet Take 80 mg by mouth 2 (two) times daily.    [provider]  guaiFENesin 200 MG tablet Take 1 tablet (200 mg total) by mouth every 4 (four) hours as needed for cough or to loosen phlegm. 10/06/22   Danford, Earl Lites, MD  HYDROcodone-acetaminophen (NORCO) 10-325 MG tablet Take 1 tablet by mouth at bedtime.    [provider]  isosorbide mononitrate (IMDUR) 30 MG 24 hr tablet Take 1/2 tablet (15 mg) by mouth at bedtime. 10/06/22   Danford, Earl Lites, MD  metolazone (ZAROXOLYN) 5 MG tablet Take 5 mg by mouth daily. For 30 days    [provider]  metoprolol succinate (TOPROL-XL) 50 MG 24 hr tablet Take 1 tablet (50 mg total) by mouth every evening. Take with or immediately following a meal. 11/16/22   Willeen Niece, MD  Multiple Vitamins-Minerals (HAIR SKIN AND NAILS FORMULA) TABS Take 1 tablet by mouth daily.  [provider]  mupirocin ointment (BACTROBAN) 2 % Apply topically 2 (two) times daily. 01/04/23   Terrilee Files, MD  pantoprazole (PROTONIX) 40 MG tablet Take 40 mg by mouth daily. 12/28/22   [provider]  potassium chloride SA (KLOR-CON M) 20 MEQ tablet Take 1 tablet (20 mEq) by mouth daily. 10/06/22   Danford, Earl Lites, MD      Allergies    Other, Penicillins, Lovastatin, Azithromycin, Clindamycin, Hydralazine, Lisinopril, Neomycin sulfate [neomycin], Niacin, Rofecoxib, Tetracycline,  Valsartan, Vytorin [ezetimibe-simvastatin], and Zetia [ezetimibe]    Review of Systems   Review of Systems  Gastrointestinal:  Positive for abdominal pain.  All other systems reviewed and are negative.   Physical Exam Updated Vital Signs BP 130/61 (BP Location: Right Arm)   Pulse 75   Temp 97.7 F (36.5 C) (Oral)   Resp 17   Ht 5\' 3"  (1.6 m)   Wt 70.3 kg   SpO2 98%   BMI 27.46 kg/m  Physical Exam Vitals and nursing note reviewed.  Constitutional:      General: She is not in acute distress.    Appearance: She is well-developed.  HENT:     Head: Normocephalic and atraumatic.  Eyes:     Conjunctiva/sclera: Conjunctivae normal.  Cardiovascular:     Rate and Rhythm: Normal rate and regular rhythm.     Heart sounds: No murmur heard. Pulmonary:     Effort: Pulmonary effort is normal. No respiratory distress.     Breath sounds: Normal breath sounds.  Abdominal:     Palpations: Abdomen is soft.     Tenderness: There is abdominal tenderness.     Comments: Patient with firm feeling 7.5 to 10 cm palpable mass right lateral flank area.  High-pitched tinkling bowel sounds present and mass.  Mass not pulsatile.  No overlying skin changes.  Musculoskeletal:        General: No swelling.     Cervical back: Neck supple.     Right lower leg: Edema present.     Left lower leg: Edema present.     Comments: Patient with bilateral lower extremity swelling with right greater than left.  2+ pitting edema bilaterally.  Bilateral lower extremity erythema with serous fluid draining from multiple superficial skin wounds on patient's right lower extremity.  Pedal pulses palpable and symmetric bilaterally.  Area tender to palpation.  Skin:    General: Skin is warm and dry.     Capillary Refill: Capillary refill takes less than 2 seconds.  Neurological:     Mental Status: She is alert.  Psychiatric:        Mood and Affect: Mood normal.     ED Results / Procedures / Treatments   Labs (all labs  ordered are listed, but only abnormal results are displayed) Labs Reviewed  CBC WITH DIFFERENTIAL/PLATELET - Abnormal; Notable for the following components:      Result Value   RBC 3.19 (*)    Hemoglobin 9.9 (*)    HCT 30.7 (*)    Eosinophils Absolute 0.8 (*)    All other components within normal limits  COMPREHENSIVE METABOLIC PANEL - Abnormal; Notable for the following components:   Sodium 134 (*)    Chloride 96 (*)    Glucose, Bld 119 (*)    BUN 104 (*)    Creatinine, Ser 3.89 (*)    Alkaline Phosphatase 28 (*)    GFR, Estimated 11 (*)    All other components within normal limits  I-STAT CHEM 8, ED - Abnormal; Notable for the following components:   Chloride 97 (*)    BUN 110 (*)    Creatinine, Ser 4.00 (*)    Glucose, Bld 118 (*)    Calcium, Ion 1.13 (*)    Hemoglobin 11.6 (*)    HCT 34.0 (*)    All other components within normal limits  LIPASE, BLOOD  LACTIC ACID, PLASMA  LACTIC ACID, PLASMA  URINALYSIS, W/ REFLEX TO CULTURE (INFECTION SUSPECTED)  HEPARIN LEVEL (UNFRACTIONATED)  APTT  APTT    EKG EKG Interpretation  Date/Time:  Monday January 10 2023 06:34:09 EDT Ventricular Rate:  75 PR Interval:    QRS Duration: 97 QT Interval:  436 QTC Calculation: 487 R Axis:   75 Text Interpretation: Atrial fibrillation Ventricular premature complex Probable LVH with secondary repol abnrm Anterior Q waves, possibly due to LVH No significant change since last tracing Confirmed by Melene Plan [phone]) on 01/10/2023 6:36:12 AM  Radiology CT ABDOMEN PELVIS WO CONTRAST  Result Date: 01/10/2023 CLINICAL DATA:  89 year old female with history of right lower quadrant abdominal pain and mass. EXAM: CT ABDOMEN AND PELVIS WITHOUT CONTRAST TECHNIQUE: Multidetector CT imaging of the abdomen and pelvis was performed following the standard protocol without IV contrast. RADIATION DOSE REDUCTION: This exam was performed according to the departmental dose-optimization program which includes  automated exposure control, adjustment of the mA and/or kV according to patient size and/or use of iterative reconstruction technique. COMPARISON:  CT of the abdomen and pelvis 11/12/2022. FINDINGS: Lower chest: Cardiomegaly. Atherosclerotic calcifications in the descending thoracic aorta. Calcifications of the mitral annulus. Hepatobiliary: Status post cholecystectomy. No definite suspicious cystic or solid hepatic lesions are confidently identified on today's noncontrast CT examination. Pancreas: No definite pancreatic mass or peripancreatic fluid collections or inflammatory changes are noted on today's noncontrast CT examination. Spleen: Unremarkable. Adrenals/Urinary Tract: Low position and altered axis of the right kidney, similar to the prior study (normal variant). Low-attenuation lesions in both kidneys, incompletely characterized on today's noncontrast CT examination, but similar to the prior study and likely to represent cysts (no imaging follow-up recommended), measuring up to 3 cm in the medial aspect of the left kidney. No hydroureteronephrosis. Small diverticulum measuring 2.7 cm in diameter extending off the left side of the urinary bladder (axial image 57 of series 3), similar to the prior study. Urinary bladder is otherwise unremarkable in appearance. Bilateral adrenal glands are normal in appearance. Stomach/Bowel: The unenhanced appearance of the stomach is normal. No pathologic dilatation of small bowel or colon. Right-sided Spigelian hernia containing the terminal ileum, cecum and proximal ascending colon. The appendix is not confidently identified and may be surgically absent. Regardless, there are no inflammatory changes noted adjacent to the cecum to suggest the presence of an acute appendicitis at this time. Numerous colonic diverticula are noted, without surrounding inflammatory changes to indicate an acute diverticulitis at this time. Vascular/Lymphatic: Atherosclerosis in the abdominal  aorta and pelvic vasculature. No lymphadenopathy noted in the abdomen or pelvis. Reproductive: Status post hysterectomy. Ovaries are not confidently identified may be surgically absent or atrophic. Other: No significant volume of ascites.  No pneumoperitoneum. Musculoskeletal: There are no aggressive appearing lytic or blastic lesions noted in the visualized portions of the skeleton. Spinal fixation hardware in the lumbar spine incidentally noted. IMPRESSION: 1. Right-sided Spigelian hernia containing a portion of the terminal ileum, cecum and proximal ascending colon. No findings to suggest associated bowel obstruction at this time. This likely accounts for the palpable abdominal  mass. 2. Colonic diverticulosis without evidence of acute diverticulitis at this time. 3. Aortic atherosclerosis. 4. Cardiomegaly. 5. Additional incidental findings, as above. Electronically Signed   By: Trudie Reed M.D.   On: 01/10/2023 07:26    Procedures Procedures    Medications Ordered in ED Medications  heparin ADULT infusion 100 units/mL (25000 units/22mL) (has no administration in time range)  fentaNYL (SUBLIMAZE) injection 50 mcg (50 mcg Intravenous Given 01/10/23 0734)  ondansetron (ZOFRAN) injection 4 mg (4 mg Intravenous Given 01/10/23 0734)  cefTRIAXone (ROCEPHIN) 1 g in sodium chloride 0.9 % 100 mL IVPB (0 g Intravenous Stopped 01/10/23 1114)    ED Course/ Medical Decision Making/ A&P Clinical Course as of 01/10/23 1330  Mon Jan 10, 2023  0803 Attempted reduction at hernia x 3 unsuccessful.  Will consult surgery. [CR]  0818 Consulted general surgery Tresa Endo who will run case by Dr. Derrell Lolling and repage for details regarding treatment plan going forward [CR]  1159 Consulted hospitalist Dr. Dartha Lodge who agreed with admission and assume further treatment/care. [CR]    Clinical Course User Index [CR] Peter Garter, PA                             Medical Decision Making Amount and/or Complexity of Data  Reviewed Labs: ordered. Radiology: ordered.  Risk Prescription drug management. Decision regarding hospitalization.   This patient presents to the ED for concern of abdominal pain, lower leg pain/swelling, this involves an extensive number of treatment options, and is a complaint that carries with it a high risk of complications and morbidity.  The differential diagnosis includes gastritis, pancreatitis, CBD pathology, hepatitis, SBO/LBO, volvulus, diverticulitis, appendicitis, hernia, pyelonephritis, nephrolithiasis, cystitis, peritonitis   Co morbidities that complicate the patient evaluation  See HPI   Additional history obtained:  Additional history obtained from EMR External records from outside source obtained and reviewed including hospital records   Lab Tests:  I Ordered, and personally interpreted labs.  The pertinent results include: No leukocytosis.  Patient with evidence of anemia with hemoglobin 9.9 of which is microcytic in nature near patient's baseline.  Platelets within normal range.  Multiple electrolyte abnormalities including hyponatremia, hypochloremia of 134 and 96 respectively.  Patient without evidence of renal dysfunction with creatinine of 3.89, BUN of 104 and GFR of 11 which is similar to prior studies performed at least within the past week.  Lactic acid within normal limit.  Lipase within normal limit.   Imaging Studies ordered:  I ordered imaging studies including CT abdomen pelvis I independently visualized and interpreted imaging which showed right-sided spigelian hernia containing portion of terminal ileum, cecum and proximal ascending colon.  Colonic diverticulosis.  Aortic atherosclerosis, cardiomegaly I agree with the radiologist interpretation  Cardiac Monitoring: / EKG:  The patient was maintained on a cardiac monitor.  I personally viewed and interpreted the cardiac monitored which showed an underlying rhythm of: Atrial fibrillation with VPC  and probable LVH  Consultations Obtained:  See ED course  Problem List / ED Course / Critical interventions / Medication management  Spigelian hernia, ESRD, cellulitis I ordered medication including Rocephin, fentanyl, Zofran   Reevaluation of the patient after these medicines showed that the patient improved I have reviewed the patients home medicines and have made adjustments as needed   Social Determinants of Health:  Denies tobacco, or drug use.   Test / Admission - Considered:  Spigelian hernia, ESRD, cellulitis Vitals signs within normal range  and stable throughout visit. Laboratory/imaging studies significant for: See above 89 year old female presents emergency department with complaints of right-sided abdominal pain/mass.  Patient found with evidence of spigelian hernia without evidence of obstruction.  Patient also with cellulitis now ongoing for 7 to 8 days refractory to outpatient oral antibiotic therapy.  Regarding abdominal hernia, consulted general surgery who agreed with seeing the patient once admitted.  Regarding refractory cellulitis to outpatient oral antibiotics, will admit for IV antibiotics.  Patient did not meet SIRS criteria so sepsis protocol was not performed/followed.  Patient also with worsening renal function with creatinine from 2.8-3.2 to now 3.9 and BUN from 97-1 04.  Given multifactorial nature of patient's presentation, admission deemed most appropriate.  Proper consultations were made as depicted in ED course.  Patient begun on antibiotics in the form of Rocephin.  Treatment plan discussed at length with patient and she acknowledged understanding was agreeable to said plan.  Patient overall well-appearing, afebrile in no acute distress.         Final Clinical Impression(s) / ED Diagnoses Final diagnoses:  Spigelian hernia  Cellulitis of lower extremity, unspecified laterality    Rx / DC Orders ED Discharge Orders     None          Peter Garter, [place] 01/10/23 1330    Gerhard Munch, MD 01/10/23 [phone]

## 2023-01-10 NOTE — Discharge Instructions (Addendum)
 UMBILICAL OR INGUINAL HERNIA REPAIR: POST OP INSTRUCTIONS  Always review your discharge instruction sheet given to you by the facility where your surgery was performed. IF YOU HAVE DISABILITY OR FAMILY LEAVE FORMS, YOU MUST BRING THEM TO THE OFFICE FOR PROCESSING.   DO NOT GIVE THEM TO YOUR DOCTOR.  A  prescription for pain medication may be given to you upon discharge.  Take your pain medication as prescribed, if needed.  If narcotic pain medicine is not needed, then you may take acetaminophen (Tylenol) or ibuprofen (Advil) as needed. Take your usually prescribed medications unless otherwise directed. If you need a refill on your pain medication, please contact your pharmacy.  They will contact our office to request authorization. Prescriptions will not be filled after 5 pm or on week-ends. You should follow a light diet the first 24 hours after arrival home, such as soup and crackers, etc.  Be sure to include lots of fluids daily.  Resume your normal diet the day after surgery. Most patients will experience some swelling and bruising around the umbilicus or in the groin and scrotum.  Ice packs and reclining will help.  Swelling and bruising can take several days to resolve.  It is common to experience some constipation if taking pain medication after surgery.  Increasing fluid intake and taking a stool softener (such as Colace) will usually help or prevent this problem from occurring.  A mild laxative (Milk of Magnesia or Miralax) should be taken according to package directions if there are no bowel movements after 48 hours. Unless discharge instructions indicate otherwise, you may remove your bandages 24-48 hours after surgery, and you may shower at that time.  You may have steri-strips (small skin tapes) in place directly over the incision.  These strips should be left on the skin for 7-10 days.  If your surgeon used skin glue on the incision, you may shower in 24 hours.  The glue will flake off  over the next 2-3 weeks.  Any sutures or staples will be removed at the office during your follow-up visit. ACTIVITIES:  You may resume regular (light) daily activities beginning the next day--such as daily self-care, walking, climbing stairs--gradually increasing activities as tolerated.  You may have sexual intercourse when it is comfortable.  Refrain from any heavy lifting or straining until approved by your doctor. You may drive when you are no longer taking prescription pain medication, you can comfortably wear a seatbelt, and you can safely maneuver your car and apply brakes. You should see your doctor in the office for a follow-up appointment approximately 2-3 weeks after your surgery.  Make sure that you call for this appointment within a day or two after you arrive home to insure a convenient appointment time.    WHEN TO CALL YOUR DOCTOR: Fever over 101.0 Inability to urinate Nausea and/or vomiting Extreme swelling or bruising Continued bleeding from incision. Increased pain, redness, or drainage from the incision  The clinic staff is available to answer your questions during regular business hours.  Please don't hesitate to call and ask to speak to one of the nurses for clinical concerns.  If you have a medical emergency, go to the nearest emergency room or call 911.  A surgeon from [facility] is always on call at the hospital   [address], [address], ,   [address] ?  [address] [address], ,    [address] [phone] ? [phone] ? FAX [phone] Web site:   [email or web address]  

## 2023-01-11 ENCOUNTER — Inpatient Hospital Stay (HOSPITAL_COMMUNITY): Payer: Medicare Other

## 2023-01-11 DIAGNOSIS — K436 Other and unspecified ventral hernia with obstruction, without gangrene: Secondary | ICD-10-CM

## 2023-01-11 DIAGNOSIS — Z01810 Encounter for preprocedural cardiovascular examination: Secondary | ICD-10-CM

## 2023-01-11 DIAGNOSIS — R0609 Other forms of dyspnea: Secondary | ICD-10-CM

## 2023-01-11 DIAGNOSIS — I4811 Longstanding persistent atrial fibrillation: Secondary | ICD-10-CM | POA: Diagnosis not present

## 2023-01-11 DIAGNOSIS — I422 Other hypertrophic cardiomyopathy: Secondary | ICD-10-CM

## 2023-01-11 DIAGNOSIS — N185 Chronic kidney disease, stage 5: Secondary | ICD-10-CM | POA: Diagnosis not present

## 2023-01-11 DIAGNOSIS — I5032 Chronic diastolic (congestive) heart failure: Secondary | ICD-10-CM | POA: Diagnosis not present

## 2023-01-11 DIAGNOSIS — R1031 Right lower quadrant pain: Secondary | ICD-10-CM | POA: Diagnosis not present

## 2023-01-11 DIAGNOSIS — N184 Chronic kidney disease, stage 4 (severe): Secondary | ICD-10-CM

## 2023-01-11 LAB — CBC
HCT: 28.2 % — ABNORMAL LOW (ref 36.0–46.0)
Hemoglobin: 9.1 g/dL — ABNORMAL LOW (ref 12.0–15.0)
MCH: 30.6 pg (ref 26.0–34.0)
MCHC: 32.3 g/dL (ref 30.0–36.0)
MCV: 94.9 fL (ref 80.0–100.0)
Platelets: 177 10*3/uL (ref 150–400)
RBC: 2.97 MIL/uL — ABNORMAL LOW (ref 3.87–5.11)
RDW: 14.6 % (ref 11.5–15.5)
WBC: 5.2 10*3/uL (ref 4.0–10.5)
nRBC: 0 % (ref 0.0–0.2)

## 2023-01-11 LAB — URINALYSIS, W/ REFLEX TO CULTURE (INFECTION SUSPECTED)
Bacteria, UA: NONE SEEN
Bilirubin Urine: NEGATIVE
Glucose, UA: NEGATIVE mg/dL
Hgb urine dipstick: NEGATIVE
Ketones, ur: NEGATIVE mg/dL
Leukocytes,Ua: NEGATIVE
Nitrite: NEGATIVE
Protein, ur: NEGATIVE mg/dL
Specific Gravity, Urine: 1.009 (ref 1.005–1.030)
pH: 6 (ref 5.0–8.0)

## 2023-01-11 LAB — ECHOCARDIOGRAM COMPLETE
AR max vel: 1.84 cm2
AV Area VTI: 1.71 cm2
AV Area mean vel: 1.74 cm2
AV Mean grad: 7 mmHg
AV Peak grad: 12.5 mmHg
Ao pk vel: 1.77 m/s
Area-P 1/2: 4.21 cm2
Height: 63 in
MV M vel: 3.98 m/s
MV Peak grad: 63.4 mmHg
S' Lateral: 2.5 cm
Weight: 2480 oz

## 2023-01-11 LAB — RENAL FUNCTION PANEL
Albumin: 2.9 g/dL — ABNORMAL LOW (ref 3.5–5.0)
Anion gap: 12 (ref 5–15)
BUN: 100 mg/dL — ABNORMAL HIGH (ref 8–23)
CO2: 25 mmol/L (ref 22–32)
Calcium: 8.9 mg/dL (ref 8.9–10.3)
Chloride: 97 mmol/L — ABNORMAL LOW (ref 98–111)
Creatinine, Ser: 3.89 mg/dL — ABNORMAL HIGH (ref 0.44–1.00)
GFR, Estimated: 11 mL/min — ABNORMAL LOW (ref >60)
Glucose, Bld: 96 mg/dL (ref 70–99)
Phosphorus: 5.4 mg/dL — ABNORMAL HIGH (ref 2.5–4.6)
Potassium: 3.7 mmol/L (ref 3.5–5.1)
Sodium: 134 mmol/L — ABNORMAL LOW (ref 135–145)

## 2023-01-11 LAB — GLUCOSE, CAPILLARY
Glucose-Capillary: 144 mg/dL — ABNORMAL HIGH (ref 70–99)
Glucose-Capillary: 156 mg/dL — ABNORMAL HIGH (ref 70–99)
Glucose-Capillary: 125 mg/dL — ABNORMAL HIGH (ref 70–99)
Glucose-Capillary: 103 mg/dL — ABNORMAL HIGH (ref 70–99)

## 2023-01-11 LAB — APTT
aPTT: 46 seconds — ABNORMAL HIGH (ref 24–36)
aPTT: 40 seconds — ABNORMAL HIGH (ref 24–36)

## 2023-01-11 LAB — MAGNESIUM: Magnesium: 1.4 mg/dL — ABNORMAL LOW (ref 1.7–2.4)

## 2023-01-11 MED ORDER — HEPARIN (PORCINE) 25000 UT/250ML-% IV SOLN: 1400.0000 [IU]/h | INTRAVENOUS | Status: DC

## 2023-01-11 MED ORDER — SODIUM CHLORIDE 0.9 % IV SOLN
2.0000 g | INTRAVENOUS | Status: AC
  Administered 2023-01-12: 2 g via INTRAVENOUS
  Filled 2023-01-11: qty 20

## 2023-01-11 MED ORDER — FUROSEMIDE 10 MG/ML IJ SOLN
80.0000 mg | Freq: Two times a day (BID) | INTRAMUSCULAR | Status: DC
  Administered 2023-01-11: 80 mg via INTRAVENOUS
  Filled 2023-01-11: qty 8

## 2023-01-11 MED ORDER — MAGNESIUM SULFATE IN D5W 1-5 GM/100ML-% IV SOLN
1.0000 g | Freq: Once | INTRAVENOUS | Status: AC
  Administered 2023-01-11: 1 g via INTRAVENOUS
  Filled 2023-01-11: qty 100

## 2023-01-11 NOTE — Progress Notes (Signed)
OT Cancellation Note  Patient Details Name: Tabitha Black MRN: [id number] DOB: 1933/05/06   Cancelled Treatment:    Reason Eval/Treat Not Completed: Medical issues which prohibited therapy. Pt currently on IV heparin less than 24 hours. Most recent lab value is not within the appropriate range to participate in OT evaluation/OOB activity at this time. Plan for hernia repair tomorrow. Will follow up with pt and evaluate when able to participate.   Limmie Patricia, OTR/L,CBIS  Supplemental OT - [facility] and [facility] Secure Chat Preferred   01/11/2023, 1:53 PM

## 2023-01-11 NOTE — Plan of Care (Signed)
  Problem: Education: Goal: Understanding of medication regimen will improve Outcome: Progressing   Problem: Activity: Goal: Ability to tolerate increased activity will improve Outcome: Progressing   Problem: Education: Goal: Knowledge of General Education information will improve Description: Including pain rating scale, medication(s)/side effects and non-pharmacologic comfort measures Outcome: Progressing   Problem: Health Behavior/Discharge Planning: Goal: Ability to manage health-related needs will improve Outcome: Progressing   Problem: Clinical Measurements: Goal: Ability to maintain clinical measurements within normal limits will improve Outcome: Progressing Goal: Will remain free from infection Outcome: Progressing Goal: Diagnostic test results will improve Outcome: Progressing   Problem: Activity: Goal: Risk for activity intolerance will decrease Outcome: Progressing   Problem: Nutrition: Goal: Adequate nutrition will be maintained Outcome: Progressing   Problem: Pain Managment: Goal: General experience of comfort will improve Outcome: Progressing   Problem: Safety: Goal: Ability to remain free from injury will improve Outcome: Progressing   Problem: Skin Integrity: Goal: Risk for impaired skin integrity will decrease Outcome: Progressing

## 2023-01-11 NOTE — Consult Note (Addendum)
Cardiology Consultation   Patient ID: Tabitha Black MRN: [id number]; DOB: [birth year]  Admit date: 01/10/2023 Date of Consult: 01/11/2023  PCP:  Irven Coe, MD   [facility] Providers Cardiologist:  Charlton Haws, MD        Patient Profile:   Tabitha Black is a 89 y.o. female with a hx of apical variant hypertrophic cardiomyopathy, longstanding persistent atrial fibrillation (last sinus EKG 06/22/2019), HFpEF, chronic lower extremity lymphedema, hypertension, hyperlipidemia, DM2, CKD stage IV, anemia, and recent GI bleed who is being seen 01/11/2023 for the evaluation of preoperative clearance prior to hernia repair at the request of Dr. Alanda Slim.  History of Present Illness:   Tabitha Black is a pleasant 89 year old female with past medical history of apical variant hypertrophic cardiomyopathy, longstanding persistent atrial fibrillation (last sinus EKG 06/22/2019), HFpEF, chronic lower extremity lymphedema, hypertension, hyperlipidemia, DM2, CKD stage IV, anemia, and recent GI bleed.  Echocardiogram obtained on 12/15/2016 showed EF 60 to 65%, no regional wall motion abnormality, grade 2 DD, trivial MR, moderate LAE, PASP 48 mmHg, asymmetric hypertrophy of LV apex (ace of spade shaped ventricle) suggestive of apical hypertrophic cardiomyopathy.  Myoview obtained on 12/28/2016 demonstrated EF 65%, normal perfusion, no ischemia or infarction.  Venous Doppler obtained in July 2018 was negative for DVT bilaterally.  More recently, patient has been admitted twice with GI bleed in February 2024 and again in April 2024.  She was found to have gastritis on EGD and transfused 1 unit of packed red blood cell in February.  With her recurrent GI bleed in April, she received 2 units of packed red blood cell, EGD demonstrated nonbleeding gastric ulcer, duodenitis and gastritis.  Colonoscopy demonstrated diverticulosis without active bleeding.  She was admitted in March 2024 with RSV.   Family describes she has  been having more swelling since her admission in April.  More recently, she went to [facility] ED on 01/04/2023 with right foot pain and swelling.  X-ray of the foot and ankle showed significant swelling but no air in the tissue.  It was concerning for cellulitis, she received a course of cephalexin.  She presented to the hospital on 01/10/2023 with 1 week onset of intermittent abdominal pain.  She has a prior history of right abdominal wall hernia.  CT of abdomen and pelvis showed right-sided spigelian hernia containing a portion of the terminal ileum, cecum and proximal ascending colon.  There was no finding to suggest bowel obstruction, colonic diverticulosis without evidence of acute diverticulitis.  She also has quite significant aortic atherosclerosis.  Creatinine on arrival was 3.89.  Hemoglobin 9.9.  White blood cell count 4.7 normal.  EKG showed atrial fibrillation, rate controlled, no significant ST-T wave changes.   Past Medical History:  Diagnosis Date   ABLA (acute blood loss anemia) 08/23/2022   CKD (chronic kidney disease)    Diabetes mellitus without complication (HCC)    type II   Gastrointestinal hemorrhage with melena 08/23/2022   GERD (gastroesophageal reflux disease)    High cholesterol    Hypertension    Osteoarthritis    Skin cancer, basal cell     Past Surgical History:  Procedure Laterality Date   ABDOMINAL HYSTERECTOMY N/A    APPENDECTOMY N/A 1953   BACK SURGERY     BIOPSY  08/25/2022   Procedure: BIOPSY;  Surgeon: Charlott Rakes, MD;  Location: [facility] ENDOSCOPY;  Service: Gastroenterology;;   CHOLECYSTECTOMY     COLONOSCOPY N/A 02/22/2008   COLONOSCOPY N/A 11/16/2022   Procedure:  COLONOSCOPY;  Surgeon: Lynann Bologna, DO;  Location: [facility] ENDOSCOPY;  Service: Gastroenterology;  Laterality: N/A;   ESOPHAGOGASTRODUODENOSCOPY (EGD) WITH PROPOFOL N/A 08/25/2022   Procedure: ESOPHAGOGASTRODUODENOSCOPY (EGD) WITH PROPOFOL;  Surgeon: Charlott Rakes, MD;  Location: [facility]  ENDOSCOPY;  Service: Gastroenterology;  Laterality: N/A;   ESOPHAGOGASTRODUODENOSCOPY (EGD) WITH PROPOFOL N/A 11/14/2022   Procedure: ESOPHAGOGASTRODUODENOSCOPY (EGD) WITH PROPOFOL;  Surgeon: Kathi Der, MD;  Location: [facility] ENDOSCOPY;  Service: Gastroenterology;  Laterality: N/A;   TOTAL KNEE ARTHROPLASTY Right    UMBILICAL HERNIA REPAIR N/A 04/02/2015   Procedure: LAPAROSCOPIC UMBILICAL HERNIA REPAIR WITH VENTRALIGHT MESH;  Surgeon: Axel Filler, MD;  Location: [facility] OR;  Service: General;  Laterality: N/A;     Home Medications:  Prior to Admission medications   Medication Sig Start Date End Date Taking? Authorizing Provider  acetaminophen (TYLENOL) 325 MG tablet Take 2 tablets (650 mg total) by mouth every 6 (six) hours as needed for mild pain (or Fever >/= 101). 08/27/22  Yes Ghimire, Werner Lean, MD  apixaban (ELIQUIS) 2.5 MG TABS tablet Take 1 tablet (2.5 mg total) by mouth 2 (two) times daily. 06/08/22  Yes Wendall Stade, MD  cephALEXin (KEFLEX) 500 MG capsule Take 1 capsule (500 mg total) by mouth 2 (two) times daily. 01/04/23  Yes Terrilee Files, MD  Cholecalciferol (VITAMIN D3) 125 MCG (5000 UT) TABS Take 5,000 Units by mouth daily.   Yes [provider]  doxazosin (CARDURA) 4 MG tablet Take 4 mg by mouth daily.   Yes [provider]  furosemide (LASIX) 80 MG tablet Take 80-120 mg by mouth 2 (two) times daily. Takes 120 mg in the morning and 80 mg at night   Yes [provider]  HYDROcodone-acetaminophen (NORCO) 10-325 MG tablet Take 1 tablet by mouth at bedtime.   Yes [provider]  isosorbide mononitrate (IMDUR) 30 MG 24 hr tablet Take 1/2 tablet (15 mg) by mouth at bedtime. 10/06/22  Yes Danford, Earl Lites, MD  metolazone (ZAROXOLYN) 5 MG tablet Take 5 mg by mouth See admin instructions. Take 5 mg by mouth Monday,Wednesday and Friday   Yes [provider]  metoprolol succinate (TOPROL-XL) 50 MG 24 hr tablet Take 1 tablet (50 mg  total) by mouth every evening. Take with or immediately following a meal. 11/16/22  Yes Willeen Niece, MD  Multiple Vitamins-Minerals (HAIR SKIN AND NAILS FORMULA) TABS Take 1 tablet by mouth daily.   Yes [provider]  NIFEdipine (PROCARDIA-XL/NIFEDICAL-XL) 30 MG 24 hr tablet Take 30 mg by mouth daily.   Yes [provider]  potassium chloride SA (KLOR-CON M) 20 MEQ tablet Take 1 tablet (20 mEq) by mouth daily. 10/06/22  Yes Danford, Earl Lites, MD  guaiFENesin 200 MG tablet Take 1 tablet (200 mg total) by mouth every 4 (four) hours as needed for cough or to loosen phlegm. Patient not taking: Reported on 01/10/2023 10/06/22   Alberteen Sam, MD  mupirocin ointment (BACTROBAN) 2 % Apply topically 2 (two) times daily. Patient not taking: Reported on 01/10/2023 01/04/23   Terrilee Files, MD  pantoprazole (PROTONIX) 40 MG tablet Take 40 mg by mouth daily. 12/28/22   [provider]    Inpatient Medications: Scheduled Meds:  furosemide  80 mg Intravenous BID   insulin aspart  0-5 Units Subcutaneous QHS   insulin aspart  0-6 Units Subcutaneous TID WC   isosorbide mononitrate  15 mg Oral QHS   metoprolol succinate  50 mg Oral QPM   pantoprazole  40 mg Oral Daily   Continuous Infusions:  [START ON 01/12/2023] cefTRIAXone (ROCEPHIN)  IV     heparin 1,200 Units/hr (01/11/23 0338)   PRN Meds: HYDROcodone-acetaminophen  Allergies:    Allergies  Allergen Reactions   Other Swelling    Topical mycin, unsure of which one, caused opthalmic swelling  Erythromycin    Penicillins Shortness Of Breath, Itching, Swelling and Rash   Lovastatin Other (See Comments)    Weakness and myalgias, maybe to statins?   Azithromycin Other (See Comments)    Patient doesn't remember having a reaction to this.   Clindamycin Other (See Comments)    Patient doesn't remember having a reaction to this.    Hydralazine Other (See Comments)    Felt foggy   Lisinopril Other (See  Comments)    Hyperkalemia    Neomycin Sulfate [Neomycin] Other (See Comments)    Redness and burning in face   Niacin Hives    Facial swelling and redness   Rofecoxib Other (See Comments)    Unknown reaction   Tetracycline    Valsartan Other (See Comments)    Hyperkalemia    Vytorin [Ezetimibe-Simvastatin] Other (See Comments)    myalgias   Zetia [Ezetimibe] Other (See Comments)    Leg pain    Social History:   Social History   Socioeconomic History   Marital status: Married    Spouse name: Not on file   Number of children: Not on file   Years of education: Not on file   Highest education level: Not on file  Occupational History   Not on file  Tobacco Use   Smoking status: Never   Smokeless tobacco: Never  Substance and Sexual Activity   Alcohol use: Yes    Comment: twice a year   Drug use: No   Sexual activity: Not on file  Other Topics Concern   Not on file  Social History Narrative   Not on file   Social Determinants of Health   Financial Resource Strain: Not on file  Food Insecurity: No Food Insecurity (11/12/2022)   Hunger Vital Sign    Worried About Running Out of Food in the Last Year: Never true    Ran Out of Food in the Last Year: Never true  Transportation Needs: No Transportation Needs (11/12/2022)   PRAPARE - Administrator, Civil Service (Medical): No    Lack of Transportation (Non-Medical): No  Physical Activity: Not on file  Stress: Not on file  Social Connections: Not on file  Intimate Partner Violence: Not At Risk (11/12/2022)   Humiliation, Afraid, Rape, and Kick questionnaire    Fear of Current or Ex-Partner: No    Emotionally Abused: No    Physically Abused: No    Sexually Abused: No    Family History:    Family History  Problem Relation Age of Onset   Heart failure Mother    Hypertension Mother    Heart attack Father    Prostate cancer Father    Breast cancer Sister      ROS:  Please see the history of present  illness.   All other ROS reviewed and negative.     Physical Exam/Data:   Vitals:   01/10/23 1710 01/10/23 1933 01/11/23 0327 01/11/23 0918  BP: (!) 146/55 (!) 149/66 118/60 (!) 139/52  Pulse: 86 81 79 75  Resp: 18 20 19 18   Temp: 98.1 F (36.7 C) 98.4 F (36.9 C) 97.6 F (36.4 C) 97.7 F (  36.5 C)  TempSrc: Oral Oral Oral Oral  SpO2: 97% 97% 94% 95%  Weight:      Height:        Intake/Output Summary (Last 24 hours) at 01/11/2023 1504 Last data filed at 01/11/2023 0350 Gross per 24 hour  Intake 2.23 ml  Output 600 ml  Net -597.77 ml      01/10/2023    6:40 AM 01/04/2023    1:41 PM 11/22/2022    8:21 AM  Last 3 Weights  Weight (lbs) 155 lb 154 lb 156 lb  Weight (kg) 70.308 kg 69.854 kg 70.761 kg     Body mass index is 27.46 kg/m.  General:  Well nourished, well developed, in no acute distress HEENT: normal Neck: no JVD Vascular: No carotid bruits; Distal pulses 2+ bilaterally Cardiac: Irregularly irregular; no murmur  Lungs:  clear to auscultation bilaterally, no wheezing, rhonchi or rales  Abd: soft, nontender, no hepatomegaly  Ext: 3+ edema in bilateral lower extremity, redness in the dorsal surface of right foot, mild skin tear in the distal right leg with some clear drainage. Musculoskeletal:  No deformities, BUE and BLE strength normal and equal Skin: warm and dry  Neuro:  CNs 2-12 intact, no focal abnormalities noted Psych:  Normal affect   EKG:  The EKG was personally reviewed and demonstrates: Atrial fibrillation, rate controlled Telemetry:  Telemetry was personally reviewed and demonstrates: Not on telemetry  Relevant CV Studies:  Echo 11/18/2016 LV EF: 60% -   65%  Study Conclusions   - Left ventricle: The cavity size was normal. There appeared to    asymmetric hypertrophy of the LV apex. Systolic function was    normal. The estimated ejection fraction was in the range of 60%    to 65%. Wall motion was normal; there were no regional wall    motion  abnormalities. Features are consistent with a pseudonormal    left ventricular filling pattern, with concomitant abnormal    relaxation and increased filling pressure (grade 2 diastolic    dysfunction).  - Aortic valve: There was no stenosis.  - Mitral valve: Mildly calcified annulus. There was trivial    regurgitation.  - Left atrium: The atrium was moderately dilated.  - Right ventricle: The cavity size was normal. Systolic function    was normal.  - Tricuspid valve: Peak RV-RA gradient (S): 45 mm Hg.  - Pulmonary arteries: PA peak pressure: 48 mm Hg (S).  - Inferior vena cava: The vessel was normal in size. The    respirophasic diameter changes were in the normal range (= 50%),    consistent with normal central venous pressure.   Impressions:   - Normal LV size with asymmetric hypertrophy of the LV apex    (spade-shaped ventricle). This is suggestive of apical    hypertrophic cardiomyopathy. EF 60-65%. Normal RV size and    systolic function. No significant valvular abnormalities. Mild    pulmonary hypertension.    Myoview 12/28/2016 Nuclear stress EF: 65%. There was no ST segment deviation noted during stress. Blood pressure demonstrated a normal response to exercise. The study is normal. This is a low risk study. The left ventricular ejection fraction is normal (55-65%).   Normal resting and stress perfusion. No ischemia or infarction EF 65%  Laboratory Data:  High Sensitivity Troponin:  No results for input(s): "TROPONINIHS" in the last 720 hours.   Chemistry Recent Labs  Lab 01/10/23 0639 01/10/23 0651 01/10/23 1339 01/11/23 0046  NA 134* 137  --  134*  K 3.5 3.6  --  3.7  CL 96* 97*  --  97*  CO2 23  --   --  25  GLUCOSE 119* 118*  --  96  BUN 104* 110*  --  100*  CREATININE 3.89* 4.00*  --  3.89*  CALCIUM 9.2  --   --  8.9  MG  --   --  1.4* 1.4*  GFRNONAA 11*  --   --  11*  ANIONGAP 15  --   --  12    Recent Labs  Lab 01/10/23 0639 01/11/23 0046   PROT 6.7  --   ALBUMIN 3.5 2.9*  AST 26  --   ALT 13  --   ALKPHOS 28*  --   BILITOT 0.8  --    Lipids No results for input(s): "CHOL", "TRIG", "HDL", "LABVLDL", "LDLCALC", "CHOLHDL" in the last 168 hours.  Hematology Recent Labs  Lab 01/10/23 0639 01/10/23 0651 01/11/23 0046  WBC 4.7  --  5.2  RBC 3.19*  --  2.97*  HGB 9.9* 11.6* 9.1*  HCT 30.7* 34.0* 28.2*  MCV 96.2  --  94.9  MCH 31.0  --  30.6  MCHC 32.2  --  32.3  RDW 14.5  --  14.6  PLT 183  --  177   Thyroid No results for input(s): "TSH", "FREET4" in the last 168 hours.  BNPNo results for input(s): "BNP", "PROBNP" in the last 168 hours.  DDimer No results for input(s): "DDIMER" in the last 168 hours.   Radiology/Studies:  CT ABDOMEN PELVIS WO CONTRAST  Result Date: 01/10/2023 CLINICAL DATA:  89 year old female with history of right lower quadrant abdominal pain and mass. EXAM: CT ABDOMEN AND PELVIS WITHOUT CONTRAST TECHNIQUE: Multidetector CT imaging of the abdomen and pelvis was performed following the standard protocol without IV contrast. RADIATION DOSE REDUCTION: This exam was performed according to the departmental dose-optimization program which includes automated exposure control, adjustment of the mA and/or kV according to patient size and/or use of iterative reconstruction technique. COMPARISON:  CT of the abdomen and pelvis 11/12/2022. FINDINGS: Lower chest: Cardiomegaly. Atherosclerotic calcifications in the descending thoracic aorta. Calcifications of the mitral annulus. Hepatobiliary: Status post cholecystectomy. No definite suspicious cystic or solid hepatic lesions are confidently identified on today's noncontrast CT examination. Pancreas: No definite pancreatic mass or peripancreatic fluid collections or inflammatory changes are noted on today's noncontrast CT examination. Spleen: Unremarkable. Adrenals/Urinary Tract: Low position and altered axis of the right kidney, similar to the prior study (normal  variant). Low-attenuation lesions in both kidneys, incompletely characterized on today's noncontrast CT examination, but similar to the prior study and likely to represent cysts (no imaging follow-up recommended), measuring up to 3 cm in the medial aspect of the left kidney. No hydroureteronephrosis. Small diverticulum measuring 2.7 cm in diameter extending off the left side of the urinary bladder (axial image 57 of series 3), similar to the prior study. Urinary bladder is otherwise unremarkable in appearance. Bilateral adrenal glands are normal in appearance. Stomach/Bowel: The unenhanced appearance of the stomach is normal. No pathologic dilatation of small bowel or colon. Right-sided Spigelian hernia containing the terminal ileum, cecum and proximal ascending colon. The appendix is not confidently identified and may be surgically absent. Regardless, there are no inflammatory changes noted adjacent to the cecum to suggest the presence of an acute appendicitis at this time. Numerous colonic diverticula are noted, without surrounding inflammatory changes to indicate an acute diverticulitis at this time. Vascular/Lymphatic: Atherosclerosis  in the abdominal aorta and pelvic vasculature. No lymphadenopathy noted in the abdomen or pelvis. Reproductive: Status post hysterectomy. Ovaries are not confidently identified may be surgically absent or atrophic. Other: No significant volume of ascites.  No pneumoperitoneum. Musculoskeletal: There are no aggressive appearing lytic or blastic lesions noted in the visualized portions of the skeleton. Spinal fixation hardware in the lumbar spine incidentally noted. IMPRESSION: 1. Right-sided Spigelian hernia containing a portion of the terminal ileum, cecum and proximal ascending colon. No findings to suggest associated bowel obstruction at this time. This likely accounts for the palpable abdominal mass. 2. Colonic diverticulosis without evidence of acute diverticulitis at this  time. 3. Aortic atherosclerosis. 4. Cardiomegaly. 5. Additional incidental findings, as above. Electronically Signed   By: Trudie Reed M.D.   On: 01/10/2023 07:26     Assessment and Plan:   Preoperative clearance: Pending robotic assisted right abdominal wall hernia repair with mesh tomorrow by Dr. Derrell Lolling.  Talking with the patient, she is very active.  She walks around with a rolling walker however mostly stays at home.  She cannot accomplish more than 4 METS of activity at baseline.  At the same time, she denies any limitations such as shortness of breath or chest discomfort.  She simply states she does not do much activity because she prefers not to.  Will discuss with MD, obtain echocardiogram today.  If normal, she will be at acceptable risk to proceed with robotic assisted hernia repair.  Apical variant hypertrophic cardiomyopathy: Seen on previous echocardiogram in 2018, no significant heart murmur on exam  Longstanding persistent atrial fibrillation: Eliquis on hold.  Rate controlled  Chronic lower extremity lymphedema: Reportedly had increased leg swelling since recent admission in April where she received 2 units of packed red blood cell for GI bleed.  Currently receiving IV diuretic.  No chest x-ray was obtained during this hospitalization.  She has diminished breath sounds in bilateral bases.  She has been to lay flat without any orthopnea or PND  Acute on chronic CKD: Baseline creatinine around 2-3, arrived with creatinine of 3.9.  Followed by nephrology service  Anemia: 2 recent admissions with upper GI bleed requiring blood transfusions in February and April 2024.   Risk Assessment/Risk Scores:          CHA2DS2-VASc Score = 6   This indicates a 9.7% annual risk of stroke. The patient's score is based upon: CHF History: 0 HTN History: 1 Diabetes History: 1 Stroke History: 0 Vascular Disease History: 1 Age Score: 2 Gender Score: 1         For questions or  updates, please contact [facility] Please consult [email or web address] for contact info under    Signed, Azalee Course, [place]  01/11/2023 3:04 PM  I have seen and examined the patient along with Azalee Course, PA .  I have reviewed the chart, notes and new data.  I agree with PA/NP's note.  Key new complaints: She does not have any shortness of breath.  At home she is able to sleep completely supine.  She sometimes gets short of breath walking around the house, but recently this has not been a bigger problem than usual.  She has chronic lower extremity edema, but at this point her swelling is less than typical. Key examination changes: Irregular rhythm, otherwise normal cardiovascular examination.  She has normal jugular venous pulsations.  Lungs are clear.  She does have symmetrical bilateral lower extremity edema 3+, but the skin is wrinkled and clearly  the edema has been worse than this in the past. Key new findings / data: Atrial fibrillation with controlled ventricular response (without need for rate control medications) on ECG.  There is voltage for LVH, but there are no major repolarization abnormalities, as usually seen with apical variant hypertrophic cardiomyopathy.  Reviewed echo.  Normal left ventricular systolic function.  Unable to evaluate diastolic function or left heart filling pressures due to atrial fibrillation, moderate pulmonary artery hypertension.  PLAN: Surgical risk is increased moderately (but not prohibitively) due to history of heart failure, atrial fibrillation, advanced age, poor functional status and frailty, need for abdominal surgery. The other hand, her heart failure appears to be well compensated and there are no cardiac interventions necessary prior to abdominal surgery.  I do not think she needs additional diuresis.  It will be important to avoid excessive IV fluid administration in the postoperative period, but also need to be wary of worsening kidney function if she has  prolonged ileus and third spacing of fluids.  There is a small, but acceptable risk of thromboembolic events with temporary interruption of her anticoagulant for atrial fibrillation.  Would like to resume anticoagulation as soon as this is felt to be safe by the surgical team.  Thurmon Fair, MD, [facility] [phone] 01/11/2023, 3:43 PM

## 2023-01-11 NOTE — Progress Notes (Signed)
Placed NPO @midnight  sign on door. Also wrote on sign that heparin needs to be stopped at 0500

## 2023-01-11 NOTE — Progress Notes (Signed)
Urine sample sent to lab

## 2023-01-11 NOTE — Progress Notes (Signed)
ANTICOAGULATION CONSULT NOTE - Follow Up Consult  Pharmacy Consult for heparin Indication: atrial fibrillation  Patient Measurements:  Labs: Recent Labs    01/10/23 0639 01/10/23 0651 01/10/23 1340 01/11/23 0046  HGB 9.9* 11.6*  --  9.1*  HCT 30.7* 34.0*  --  28.2*  PLT 183  --   --  177  APTT  --   --  30 46*  HEPARINUNFRC  --   --  0.91*  --   CREATININE 3.89* 4.00*  --   --     Assessment: 89yo female subtherapeutic on heparin with initial dosing for Afib while apixaban on hold; no infusion issues or signs of bleeding per RN.  Goal of Therapy:  aPTT 66-102 seconds   Plan:  Increase heparin infusion by 3 units/kg/hr to 1200 units/hr. Check PTT in 8 hours.   Vernard Gambles, PharmD, BCPS 01/11/2023 3:36 AM

## 2023-01-11 NOTE — Progress Notes (Signed)
IVT consult placed for second PIV for IV magnesium infusion. Patient is currently receiving IV heparin. Consulted Micromedex to determine compatibility. Meds are compatible. Secure chat sent to RN stating that current PIV can be used for both infusions.   Anysha Frappier Loyola Mast, RN

## 2023-01-11 NOTE — Progress Notes (Signed)
Received call from pharmacy to increase Heparin drip from 1000 units/hr to 12000 units/hr.

## 2023-01-11 NOTE — Progress Notes (Signed)
PROGRESS NOTE  Tabitha Black WUJ:[id number] DOB: Sep 11, 1932   PCP: Irven Coe, MD  Patient is from: Home.  Uses rolling walker at baseline.  DOA: 01/10/2023 LOS: 1  Chief complaints Chief Complaint  Patient presents with   Abdominal Pain     Brief Narrative / Interim history: 89 year old F with PMH of diastolic CHF, A-fib on Eliquis, CKD-5, HTN, HLD, multiple abdominal surgeries, right abdominal hernia and BLE lymphedema presenting with intermittent abdominal pain for about 1 week that has become persistent the day of presentation.  Right-sided hernia was not reducible.  CT abdomen and pelvis revealed right-sided spigelian hernia containing a portion of terminal ileum, cecum and proximal ascending colon without bowel obstruction.  She was hemodynamically stable. Cr 4.0 (about baseline)..  BUN 110.  General surgery consulted and planning surgery on 6/26.  Cardiology consulted for cardiac clearance.  Nephrology consulted about CKD/azotemia.    Subjective: Seen and examined earlier this morning.  Continues to endorse RLQ pain radiating down into her groin.  Pain is intermittent.  Reports passing flatus but denies bowel movement since admission.  Denies nausea or vomiting.  Denies UTI symptoms.  Patient's daughter at bedside.  Objective: Vitals:   01/10/23 1710 01/10/23 1933 01/11/23 0327 01/11/23 0918  BP: (!) 146/55 (!) 149/66 118/60 (!) 139/52  Pulse: 86 81 79 75  Resp: 18 20 19 18   Temp: 98.1 F (36.7 C) 98.4 F (36.9 C) 97.6 F (36.4 C) 97.7 F (36.5 C)  TempSrc: Oral Oral Oral Oral  SpO2: 97% 97% 94% 95%  Weight:      Height:        Examination:  GENERAL: No apparent distress.  Nontoxic. HEENT: MMM.  Vision and hearing grossly intact.  NECK: Supple.  No apparent JVD.  RESP:  No IWOB.  Fair aeration bilaterally. CVS:  RRR. Heart sounds normal.  ABD/GI/GU: BS+. Abd soft, NTND.  MSK/EXT:  Moves extremities.  BLE lymphedema.  No signs of infection. SKIN: no apparent  skin lesion or wound NEURO: Awake, alert and oriented appropriately.  No apparent focal neuro deficit. PSYCH: Calm. Normal affect.   Procedures:  None  Microbiology summarized: None  Assessment and plan: Principal Problem:   Irreducible Spigelian hernia Active Problems:   (HFpEF) heart failure with preserved ejection fraction (HCC)   Chronic kidney disease (CKD), active medical management without dialysis, stage 5 (HCC)   HTN (hypertension)   Persistent atrial fibrillation (HCC)   Lymphedema of both lower extremities - R > L   Abdominal pain  Abdominal pain due to irreducible spigelian hernia: Patient with known history of abdominal hernia.  Previously reducible.  Presents with significant pain.  No evidence of obstruction.  Passing flatus.  No bowel movement since admission.  No nausea or vomiting. -General surgery following-plan for laparoscopic assisted incisional hernia repair on 6/26. -Cardiology has been consulted for cardiac clearance and will see patient -N.p.o. after midnight -Pain control.    Chronic diastolic CHF: Appears euvolemic except for chronic BLE lymphedema.  TTE in 2018 with LVEF of 60 to 65%, no RWMA but G2 DD.  On Lasix and metolazone at home.  Converted to IV Lasix on admission.  Creatinine improving. -Continue IV Lasix 80 mg twice daily -Cardiology consulted -Strict intake and output, daily weights, renal functions and electrolytes  Bilateral lower extremity lymphedema: Doubt cellulitis. -Discontinue antibiotics but getting perioperatively. 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  Followed by Dr. Allena Katz. Recent Labs    10/26/22 1336 11/09/22 1300  11/12/22 1631 11/13/22 0422 11/14/22 0050 11/16/22 0033 01/04/23 1346 01/10/23 0639 01/10/23 0651 01/11/23 0046  BUN 74* 97* 108* 99* 91* 69* 97* 104* 110* 100*  CREATININE 3.11* 3.09* 3.21* 3.19* 3.26* 2.81* 3.90* 3.89* 4.00* 3.89*  -Nephrology consulted and will see patient -Monitor  daily   Bone mineral disorder: Has mild hyperphosphatemia. -May need Phos binder but defer to nephrology   Anemia of chronic disease: Stable. Recent Labs    11/23/22 1227 11/23/22 1232 12/07/22 1202 12/21/22 1229 12/21/22 1232 01/04/23 1242 01/04/23 1346 01/10/23 0639 01/10/23 0651 01/11/23 0046  HGB 10.4* 10.8* 10.3* 9.7* 9.9* 9.8* 10.0* 9.9* 11.6* 9.1*  -Check anemia panel in the morning -ESA per nephrology if deemed appropriate   Persistent atrial fibrillation: On Eliquis at home.  Rate controlled. -Continue metoprolol -IV heparin for anticoagulation.  Eliquis on hold for surgery. -Optimize electrolytes   Hypertension: Normotensive for most part -Continue current regimen  Hypomagnesemia: -IV magnesium sulfate 1 g x 1  Generalized weakness/physical deconditioning -PT/OT  Body mass index is 27.46 kg/m.           DVT prophylaxis:  On full dose anticoagulation  Code Status: Full code Family Communication: Updated patient's daughter at bedside Level of care: Telemetry Medical Status is: Inpatient Remains inpatient appropriate because: Incarcerated abdominal hernia   Final disposition: TBD Consultants:  General surgery Cardiology Nephrology  55 minutes with more than 50% spent in reviewing records, counseling patient/family and coordinating care.   Sch Meds:  Scheduled Meds:  furosemide  80 mg Intravenous BID   insulin aspart  0-5 Units Subcutaneous QHS   insulin aspart  0-6 Units Subcutaneous TID WC   isosorbide mononitrate  15 mg Oral QHS   metoprolol succinate  50 mg Oral QPM   pantoprazole  40 mg Oral Daily   Continuous Infusions:  [START ON 01/12/2023] cefTRIAXone (ROCEPHIN)  IV     heparin 1,200 Units/hr (01/11/23 0338)   magnesium sulfate bolus IVPB     PRN Meds:.HYDROcodone-acetaminophen  Antimicrobials: Anti-infectives (From admission, onward)    Start     Dose/Rate Route Frequency Ordered Stop   01/12/23 0600  cefTRIAXone  (ROCEPHIN) 2 g in sodium chloride 0.9 % 100 mL IVPB        2 g 200 mL/hr over 30 Minutes Intravenous On call to O.R. 01/11/23 1045 01/13/23 0559   01/10/23 0945  cefTRIAXone (ROCEPHIN) 1 g in sodium chloride 0.9 % 100 mL IVPB        1 g 200 mL/hr over 30 Minutes Intravenous  Once 01/10/23 0938 01/10/23 1114        I have personally reviewed the following labs and images: CBC: Recent Labs  Lab 01/04/23 1242 01/04/23 1346 01/10/23 0639 01/10/23 0651 01/11/23 0046  WBC  --  4.3 4.7  --  5.2  NEUTROABS  --  2.6 2.2  --   --   HGB 9.8* 10.0* 9.9* 11.6* 9.1*  HCT  --  31.8* 30.7* 34.0* 28.2*  MCV  --  93.8 96.2  --  94.9  PLT  --  130* 183  --  177   BMP &GFR Recent Labs  Lab 01/04/23 1346 01/10/23 0639 01/10/23 0651 01/10/23 1339 01/11/23 0046  NA 137 134* 137  --  134*  K 3.7 3.5 3.6  --  3.7  CL 94* 96* 97*  --  97*  CO2 26 23  --   --  25  GLUCOSE 105* 119* 118*  --  96  BUN 97* 104* 110*  --  100*  CREATININE 3.90* 3.89* 4.00*  --  3.89*  CALCIUM 10.2 9.2  --   --  8.9  MG  --   --   --  1.4* 1.4*  PHOS  --   --   --  5.2* 5.4*   Estimated Creatinine Clearance: 9.6 mL/min (A) (by C-G formula based on SCr of 3.89 mg/dL (H)). Liver & Pancreas: Recent Labs  Lab 01/04/23 1346 01/10/23 0639 01/11/23 0046  AST 26 26  --   ALT 9 13  --   ALKPHOS 25* 28*  --   BILITOT 1.1 0.8  --   PROT 7.6 6.7  --   ALBUMIN 4.2 3.5 2.9*   Recent Labs  Lab 01/10/23 0639  LIPASE 44   No results for input(s): "AMMONIA" in the last 168 hours. Diabetic: No results for input(s): "HGBA1C" in the last 72 hours. Recent Labs  Lab 01/10/23 1338 01/10/23 1706 01/10/23 1935 01/11/23 0914  GLUCAP 111* 110* 107* 144*   Cardiac Enzymes: No results for input(s): "CKTOTAL", "CKMB", "CKMBINDEX", "TROPONINI" in the last 168 hours. No results for input(s): "PROBNP" in the last 8760 hours. Coagulation Profile: No results for input(s): "INR", "PROTIME" in the last 168 hours. Thyroid  Function Tests: No results for input(s): "TSH", "T4TOTAL", "FREET4", "T3FREE", "THYROIDAB" in the last 72 hours. Lipid Profile: No results for input(s): "CHOL", "HDL", "LDLCALC", "TRIG", "CHOLHDL", "LDLDIRECT" in the last 72 hours. Anemia Panel: No results for input(s): "VITAMINB12", "FOLATE", "FERRITIN", "TIBC", "IRON", "RETICCTPCT" in the last 72 hours. Urine analysis:    Component Value Date/Time   COLORURINE YELLOW 11/23/2022 1232   APPEARANCEUR CLEAR 11/23/2022 1232   LABSPEC 1.008 11/23/2022 1232   PHURINE 5.0 11/23/2022 1232   GLUCOSEU NEGATIVE 11/23/2022 1232   HGBUR NEGATIVE 11/23/2022 1232   BILIRUBINUR NEGATIVE 11/23/2022 1232   KETONESUR NEGATIVE 11/23/2022 1232   PROTEINUR NEGATIVE 11/23/2022 1232   UROBILINOGEN 1.0 04/01/2015 0425   NITRITE NEGATIVE 11/23/2022 1232   LEUKOCYTESUR NEGATIVE 11/23/2022 1232   Sepsis Labs: Invalid input(s): "PROCALCITONIN", "LACTICIDVEN"  Microbiology: No results found for this or any previous visit (from the past 240 hour(s)).  Radiology Studies: No results found.    Cressie Betzler T. Charles Andringa [facility]  If 7PM-7AM, please contact night-coverage [email or web address] 01/11/2023, 11:31 AM

## 2023-01-11 NOTE — Progress Notes (Signed)
Heparin increased to 14 per pharmacy

## 2023-01-11 NOTE — Progress Notes (Signed)
Norco prn pain med given per pt request

## 2023-01-11 NOTE — Progress Notes (Signed)
ANTICOAGULATION CONSULT NOTE - Follow Up Consult  Pharmacy Consult for heparin Indication: atrial fibrillation  Patient Measurements:  Labs: Recent Labs    01/10/23 0639 01/10/23 0651 01/10/23 1340 01/11/23 0046 01/11/23 1652  HGB 9.9* 11.6*  --  9.1*  --   HCT 30.7* 34.0*  --  28.2*  --   PLT 183  --   --  177  --   APTT  --   --  30 46* 40*  HEPARINUNFRC  --   --  0.91*  --   --   CREATININE 3.89* 4.00*  --  3.89*  --      Assessment: 89yo female subtherapeutic on heparin with initial dosing for Afib while apixaban on hold  PTT subtherapeutic at 40 sec despite rate increase to 1200 units/hr. No pauses or interruptions to the infusion per d/w RN. Reports some bleeding at IV site that resolved with dressing change.  Goal of Therapy:  aPTT 66-102 seconds   Plan:  Increase heparin drip to 1400 units/hr Check PTT in 8 hours.  Heparin to stop at 05:00 on 6/25 for surgery  Loralee Pacas, PharmD, BCPS 01/11/2023 5:41 PM    Please check AMION for all [facility] phone numbers After 10:00 PM, call Main Pharmacy [phone]

## 2023-01-11 NOTE — Progress Notes (Signed)
Echocardiogram 2D Echocardiogram has been performed.  Tabitha Black 01/11/2023, 4:53 PM

## 2023-01-11 NOTE — Evaluation (Signed)
Physical Therapy Evaluation Patient Details Name: Tabitha Black MRN: [id number] DOB: 16-May-1933 Today's Date: 01/11/2023  History of Present Illness  89 year old F presenting with intermittent abdominal pain for about 1 week that has become persistent the day of presentation.  Right-sided hernia was not reducible.  CT abdomen and pelvis revealed right-sided spigelian hernia containing a portion of terminal ileum, cecum and proximal ascending colon without bowel obstruction; with PMH of diastolic CHF, A-fib on Eliquis, CKD-5, HTN, HLD, multiple abdominal surgeries, right abdominal hernia and BLE lymphedema  Clinical Impression   Pt admitted with above diagnosis. Lives at home alone, in a single-level home with a ramped entrance; Prior to admission, pt was able to overall manage independently, using a RW for amb; daughter and sister assist with transportation; Presents to PT with generalized weakness, abdominal pain (mostly RLQ) with movement, bil foot pain with weight bearing, functional dependencies;  Needs mod assist to stand from elevated bed and 2 person mod assit for safe transfer OOB to recliner; Very painful R foot, with incr edema and skin opening dorsum of foot -- rec WOC RN consult; Anticipate will need post-acute rehab post-op to maximize independence and safety with mobility and ADLs and allow for dc home; Pt currently with functional limitations due to the deficits listed below (see PT Problem List). Pt will benefit from skilled PT to increase their independence and safety with mobility to allow discharge to the venue listed below.          Recommendations for follow up therapy are one component of a multi-disciplinary discharge planning process, led by the attending physician.  Recommendations may be updated based on patient status, additional functional criteria and insurance authorization.  Follow Up Recommendations       Assistance Recommended at Discharge Frequent or constant  Supervision/Assistance  Patient can return home with the following  A lot of help with walking and/or transfers    Equipment Recommendations Other (comment) (has RW and 3in1; further recs depend on status postop)  Recommendations for Other Services  Other (comment) (WOC RN)    Functional Status Assessment Patient has had a recent decline in their functional status and demonstrates the ability to make significant improvements in function in a reasonable and predictable amount of time.     Precautions / Restrictions Precautions Precautions: Fall Precaution Comments: observed log roll technique to get up to minimize abdominal discomfort Restrictions Weight Bearing Restrictions: No      Mobility  Bed Mobility Overal bed mobility: Needs Assistance Bed Mobility: Supine to Sit     Supine to sit: Mod assist, +2 for safety/equipment     General bed mobility comments: used HOB eevation feature to assist in getting up; 2 person assist to shift hips and elevated trunk all the way up to sitting    Transfers Overall transfer level: Needs assistance Equipment used: Rolling walker (2 wheels) Transfers: Sit to/from Stand, Bed to chair/wheelchair/BSC Sit to Stand: Mod assist, +2 safety/equipment   Step pivot transfers: Mod assist, +2 safety/equipment       General transfer comment: Stood from elevated bed; Fearful of falling; mod assist to steady and keep relciner close as pt took short steps bed to recliner    Ambulation/Gait                  Stairs            Wheelchair Mobility    Modified Rankin (Stroke Patients Only)       Balance Overall  balance assessment: Needs assistance   Sitting balance-Leahy Scale: Fair       Standing balance-Leahy Scale: Poor                               Pertinent Vitals/Pain Pain Assessment Pain Assessment: Faces Faces Pain Scale: Hurts even more Pain Location: R lower abdomen (esp when coughs); Bil feet, R  more painful than L Pain Descriptors / Indicators: Grimacing, Guarding Pain Intervention(s): Monitored during session, Repositioned    Home Living Family/patient expects to be discharged to:: Private residence Living Arrangements: Alone Available Help at Discharge: Family;Available PRN/intermittently Type of Home: House Home Access: Ramped entrance       Home Layout: One level Home Equipment: Toilet riser;Shower seat;Grab bars - toilet;Grab bars - tub/shower;Other (comment);Rolling Walker (2 wheels) Additional Comments: Sleeps in lift chair; does not use lift feature of chair (very much); has a life alert-type service    Prior Function Prior Level of Function : Needs assist             Mobility Comments: uses RW ADLs Comments: ind; family assists with transportation     Hand Dominance   Dominant Hand: Right    Extremity/Trunk Assessment   Upper Extremity Assessment Upper Extremity Assessment: Defer to OT evaluation    Lower Extremity Assessment Lower Extremity Assessment: Generalized weakness;RLE deficits/detail;LLE deficits/detail RLE Deficits / Details: history of BLE lymphedema, with noted superfical wound anterolateral lowe leg; also painful area dorsum of R foot with noted small fissures in skin of dorsal webspace between first and second rays and 2nd and third rays, extending a few centimeters proximally; pinful with standing today RLE: Unable to fully assess due to pain LLE Deficits / Details: history of bil lymphedema; incr edema lower leg and ankle; no wounds observed LLE       Communication   Communication: HOH  Cognition Arousal/Alertness: Awake/alert Behavior During Therapy: WFL for tasks assessed/performed Overall Cognitive Status: Within Functional Limits for tasks assessed                                          General Comments General comments (skin integrity, edema, etc.): Discussed recovery postop, including PT goals aimed at  recovering to independence to allow for dc home; We must consider a post-acute rehab stay to recover and gain strength to dc home alone    Exercises     Assessment/Plan    PT Assessment Patient needs continued PT services  PT Problem List Decreased strength;Decreased range of motion;Decreased activity tolerance;Decreased balance;Decreased mobility;Decreased coordination;Decreased knowledge of precautions;Decreased skin integrity       PT Treatment Interventions DME instruction;Gait training;Stair training;Functional mobility training;Therapeutic activities;Therapeutic exercise;Balance training;Patient/family education;Neuromuscular re-education    PT Goals (Current goals can be found in the Care Plan section)  Acute Rehab PT Goals Patient Stated Goal: decr pain; recover well enough to go home PT Goal Formulation: With patient/family Time For Goal Achievement: 01/25/23 Potential to Achieve Goals: Good    Frequency Min 3X/week     Co-evaluation               AM-PAC PT "6 Clicks" Mobility  Outcome Measure Help needed turning from your back to your side while in a flat bed without using bedrails?: A Lot Help needed moving from lying on your back to sitting on the side  of a flat bed without using bedrails?: A Lot Help needed moving to and from a bed to a chair (including a wheelchair)?: A Lot Help needed standing up from a chair using your arms (e.g., wheelchair or bedside chair)?: Total Help needed to walk in hospital room?: Total Help needed climbing 3-5 steps with a railing? : Total 6 Click Score: 9    End of Session Equipment Utilized During Treatment: Gait belt Activity Tolerance: Patient limited by pain Patient left: in chair;with call bell/phone within reach;with family/visitor present Nurse Communication: Mobility status (Consider WOC RN Consult) PT Visit Diagnosis: Unsteadiness on feet (R26.81);Other abnormalities of gait and mobility (R26.89);Muscle weakness  (generalized) (M62.81);Difficulty in walking, not elsewhere classified (R26.2)    Time: 1400-1436 PT Time Calculation (min) (ACUTE ONLY): 36 min   Charges:   PT Evaluation $PT Eval Moderate Complexity: 1 Mod PT Treatments $Therapeutic Activity: 8-22 mins        Van Clines, PT  Acute Rehabilitation Services Office [phone] Secure Chat welcomed   Levi Aland 01/11/2023, 5:50 PM

## 2023-01-11 NOTE — Consult Note (Signed)
Nephrology Consult   Assessment/Recommendations: Tabitha Black is a/an 89 y.o. female with a past medical history notable for AKI    AKI on CKD4-5 -baseline Cr most recently has been in the mid 2's to 3's range. Underlying CKD presumed to be secondary to HTN and CRS -AKI likely secondary to fluctuating volume status/CRS. Cr improved to 3.89 with lasix 80mg  IV BID, would continue for today -no obstruction on CT. UA pending.  -no indication for dialysis at this time, volume status is stable and not exhibiting any uremic symptoms -renal replacement therapy candidacy: patient, daughter, and I had an extensive conversation in regards to renal replacement therapy. I did express my concerns in regards to her functional status and advanced age. [place] has been thinking about this for quite some time. I did discuss process of initiation and timing. I also discussed NOT doing dialysis as well. They have a lot to discuss and think about. We also discussed the role of palliative care -Avoid nephrotoxic medications including NSAIDs and iodinated intravenous contrast exposure unless the latter is absolutely indicated.  Preferred narcotic agents for pain control are hydromorphone, fentanyl, and methadone. Morphine should not be used. Avoid Baclofen and avoid oral sodium phosphate and magnesium citrate based laxatives / bowel preps. Continue strict Input and Output monitoring. Will monitor the patient closely with you and intervene or adjust therapy as indicated by changes in clinical status/labs   RLQ Hernia -surgery following, planning for OR tomorrow pending cardio clearance  Chronic diastolic CHF Lymphedema -currently on lasix 80mg  IV BID. Monitor daily weights and strict I/O. Low threshold to temporarily stop diuretics if renal function is worsening. Will let her get another dose this evening and stop further doses. Will reassess thereafter in regards to diuretic dosing  HTN -BP currently  acceptable  Anemia of CKD -receives ESA outpatient. Retacrit 10,000 units every 2 weeks, last dose 6/18. Hgb currently 9.1. Due for ESA next week  Secondary hyperparathyroidism, CKD-MBD -renal diet for now, PO4 acceptable  Recommendations conveyed to primary service.    Anthony Sar [facility] 01/11/2023 11:28 AM   _____________________________________________________________________________________   History of Present Illness: Tabitha Black is a/an 89 y.o. female with a past medical history of CKD, hypertension, A-fib, diastolic CHF, DM2, lymphedema, morbid obesity, history of GI bleed who presents to [facility] with abdominal pain. CT scan in ER found to have RLQ incicisional hernia. Open for lap assisted hernia repair tomorrow with surgery.  Creatinine on presentation was 4 with BUN of 110, hemoglobin 11.6.  Today her creatinine is down to 3.89 with a BUN of 100. Followed by Dr. Mosetta Putt, last seen in March at our extender clinic. Has been on lasix 120mg  AM + 80mg  PM and metolazone daily which was recently changed to MWF. Her Cr back in March at our office was 2.5 with an eGFR of 18.  Patient seen and examined bedside. Daughter at bedside to provide collateral data. Patient reports having chronic diarrhea which is intermittent. She did report having poor appetite since leaving the hospital previously but this has been improving. Did have some nausea-improving. Denies any recent NSAIDs. No other complaints.   Medications:  Current Facility-Administered Medications  Medication Dose Route Frequency Provider Last Rate Last Admin   [START ON 01/12/2023] cefTRIAXone (ROCEPHIN) 2 g in sodium chloride 0.9 % 100 mL IVPB  2 g Intravenous On Call to OR Barnetta Chapel, PA-C       furosemide (LASIX) injection 80 mg  80  mg Intravenous BID Berton Mount I, MD   80 mg at 01/11/23 8119   heparin ADULT infusion 100 units/mL (25000 units/259mL)  1,200 Units/hr Intravenous Continuous  Barnetta Chapel, PA-C 12 mL/hr at 01/11/23 0338 1,200 Units/hr at 01/11/23 1478   HYDROcodone-acetaminophen (NORCO/VICODIN) 5-325 MG per tablet 1 tablet  1 tablet Oral Q6H PRN Berton Mount I, MD   1 tablet at 01/11/23 2956   insulin aspart (novoLOG) injection 0-5 Units  0-5 Units Subcutaneous QHS Berton Mount I, MD       insulin aspart (novoLOG) injection 0-6 Units  0-6 Units Subcutaneous TID WC Berton Mount I, MD       isosorbide mononitrate (IMDUR) 24 hr tablet 15 mg  15 mg Oral QHS Berton Mount I, MD   15 mg at 01/10/23 2113   magnesium sulfate IVPB 1 g 100 mL  1 g Intravenous Once Candelaria Stagers T, MD       metoprolol succinate (TOPROL-XL) 24 hr tablet 50 mg  50 mg Oral QPM Berton Mount I, MD   50 mg at 01/10/23 1728   pantoprazole (PROTONIX) EC tablet 40 mg  40 mg Oral Daily Berton Mount I, MD   40 mg at 01/11/23 2130     ALLERGIES Other, Penicillins, Lovastatin, Azithromycin, Clindamycin, Hydralazine, Lisinopril, Neomycin sulfate [neomycin], Niacin, Rofecoxib, Tetracycline, Valsartan, Vytorin [ezetimibe-simvastatin], and Zetia [ezetimibe]  MEDICAL HISTORY Past Medical History:  Diagnosis Date   ABLA (acute blood loss anemia) 08/23/2022   CKD (chronic kidney disease)    Diabetes mellitus without complication (HCC)    type II   Gastrointestinal hemorrhage with melena 08/23/2022   GERD (gastroesophageal reflux disease)    High cholesterol    Hypertension    Osteoarthritis    Skin cancer, basal cell      SOCIAL HISTORY Social History   Socioeconomic History   Marital status: Married    Spouse name: Not on file   Number of children: Not on file   Years of education: Not on file   Highest education level: Not on file  Occupational History   Not on file  Tobacco Use   Smoking status: Never   Smokeless tobacco: Never  Substance and Sexual Activity   Alcohol use: Yes    Comment: twice a year   Drug use: No   Sexual activity: Not on file  Other  Topics Concern   Not on file  Social History Narrative   Not on file   Social Determinants of Health   Financial Resource Strain: Not on file  Food Insecurity: No Food Insecurity (11/12/2022)   Hunger Vital Sign    Worried About Running Out of Food in the Last Year: Never true    Ran Out of Food in the Last Year: Never true  Transportation Needs: No Transportation Needs (11/12/2022)   PRAPARE - Transportation    Lack of Transportation (Medical): No    Lack of Transportation (Non-Medical): No  Physical Activity: Not on file  Stress: Not on file  Social Connections: Not on file  Intimate Partner Violence: Not At Risk (11/12/2022)   Humiliation, Afraid, Rape, and Kick questionnaire    Fear of Current or Ex-Partner: No    Emotionally Abused: No    Physically Abused: No    Sexually Abused: No     FAMILY HISTORY Family History  Problem Relation Age of Onset   Heart failure Mother    Hypertension Mother    Heart attack Father    Prostate cancer Father  Breast cancer Sister      Review of Systems: 12 systems reviewed Otherwise as per HPI, all other systems reviewed and negative  Physical Exam: Vitals:   01/11/23 0327 01/11/23 0918  BP: 118/60 (!) 139/52  Pulse: 79 75  Resp: 19 18  Temp: 97.6 F (36.4 C) 97.7 F (36.5 C)  SpO2: 94% 95%   No intake/output data recorded.  Intake/Output Summary (Last 24 hours) at 01/11/2023 1128 Last data filed at 01/11/2023 0350 Gross per 24 hour  Intake 2.23 ml  Output 600 ml  Net -597.77 ml   General: well-appearing, no acute distress HEENT: anicteric sclera, oropharynx clear without lesions CV: regular rate, normal rhythm, no murmurs, no gallops, no rubs Lungs: clear to auscultation bilaterally, normal work of breathing Abd: soft, non-tender, non-distended Skin: no visible lesions or rashes Psych: alert, engaged, appropriate mood and affect Musculoskeletal: lymphedema bilateral LEs with trace to 1+ pitting edema Neuro: normal  speech, no gross focal deficits, no asterixis   Test Results Reviewed Lab Results  Component Value Date   NA 134 (L) 01/11/2023   K 3.7 01/11/2023   CL 97 (L) 01/11/2023   CO2 25 01/11/2023   BUN 100 (H) 01/11/2023   CREATININE 3.89 (H) 01/11/2023   CALCIUM 8.9 01/11/2023   ALBUMIN 2.9 (L) 01/11/2023   PHOS 5.4 (H) 01/11/2023     I have reviewed all relevant outside healthcare records related to the patient's kidney injury.

## 2023-01-11 NOTE — Progress Notes (Addendum)
Subjective: Feeling a bit better today.  + flatus.  No BM since admission.  Eating with no nausea.  Daughter at bedside today  ROS: See above, otherwise other systems negative  Objective: Vital signs in last 24 hours: Temp:  [97.6 F (36.4 C)-98.4 F (36.9 C)] 97.7 F (36.5 C) (06/25 0918) Pulse Rate:  [73-86] 75 (06/25 0918) Resp:  [13-20] 18 (06/25 0918) BP: (118-149)/(52-66) 139/52 (06/25 0918) SpO2:  [94 %-98 %] 95 % (06/25 0918)    Intake/Output from previous day: 06/24 0701 - 06/25 0700 In: 2.2 [I.V.:2.2] Out: 600 [Urine:600] Intake/Output this shift: No intake/output data recorded.  PE: Gen: NAD Abd: soft, hernia seems much softer today and certainly partially reducible, +BS, ND  Lab Results:  Recent Labs    01/10/23 0639 01/10/23 0651 01/11/23 0046  WBC 4.7  --  5.2  HGB 9.9* 11.6* 9.1*  HCT 30.7* 34.0* 28.2*  PLT 183  --  177   BMET Recent Labs    01/10/23 0639 01/10/23 0651 01/11/23 0046  NA 134* 137 134*  K 3.5 3.6 3.7  CL 96* 97* 97*  CO2 23  --  25  GLUCOSE 119* 118* 96  BUN 104* 110* 100*  CREATININE 3.89* 4.00* 3.89*  CALCIUM 9.2  --  8.9   PT/INR No results for input(s): "LABPROT", "INR" in the last 72 hours. CMP     Component Value Date/Time   NA 134 (L) 01/11/2023 0046   NA 141 06/08/2022 1619   K 3.7 01/11/2023 0046   CL 97 (L) 01/11/2023 0046   CO2 25 01/11/2023 0046   GLUCOSE 96 01/11/2023 0046   BUN 100 (H) 01/11/2023 0046   BUN 44 (H) 06/08/2022 1619   CREATININE 3.89 (H) 01/11/2023 0046   CALCIUM 8.9 01/11/2023 0046   PROT 6.7 01/10/2023 0639   ALBUMIN 2.9 (L) 01/11/2023 0046   AST 26 01/10/2023 0639   ALT 13 01/10/2023 0639   ALKPHOS 28 (L) 01/10/2023 0639   BILITOT 0.8 01/10/2023 0639   GFRNONAA 11 (L) 01/11/2023 0046   GFRAA 22 (L) 07/25/2020 1126   Lipase     Component Value Date/Time   LIPASE 44 01/10/2023 0639       Studies/Results: CT ABDOMEN PELVIS WO CONTRAST  Result Date:  01/10/2023 CLINICAL DATA:  89 year old female with history of right lower quadrant abdominal pain and mass. EXAM: CT ABDOMEN AND PELVIS WITHOUT CONTRAST TECHNIQUE: Multidetector CT imaging of the abdomen and pelvis was performed following the standard protocol without IV contrast. RADIATION DOSE REDUCTION: This exam was performed according to the departmental dose-optimization program which includes automated exposure control, adjustment of the mA and/or kV according to patient size and/or use of iterative reconstruction technique. COMPARISON:  CT of the abdomen and pelvis 11/12/2022. FINDINGS: Lower chest: Cardiomegaly. Atherosclerotic calcifications in the descending thoracic aorta. Calcifications of the mitral annulus. Hepatobiliary: Status post cholecystectomy. No definite suspicious cystic or solid hepatic lesions are confidently identified on today's noncontrast CT examination. Pancreas: No definite pancreatic mass or peripancreatic fluid collections or inflammatory changes are noted on today's noncontrast CT examination. Spleen: Unremarkable. Adrenals/Urinary Tract: Low position and altered axis of the right kidney, similar to the prior study (normal variant). Low-attenuation lesions in both kidneys, incompletely characterized on today's noncontrast CT examination, but similar to the prior study and likely to represent cysts (no imaging follow-up recommended), measuring up to 3 cm in the medial aspect of the left kidney. No hydroureteronephrosis. Small diverticulum  measuring 2.7 cm in diameter extending off the left side of the urinary bladder (axial image 57 of series 3), similar to the prior study. Urinary bladder is otherwise unremarkable in appearance. Bilateral adrenal glands are normal in appearance. Stomach/Bowel: The unenhanced appearance of the stomach is normal. No pathologic dilatation of small bowel or colon. Right-sided Spigelian hernia containing the terminal ileum, cecum and proximal ascending  colon. The appendix is not confidently identified and may be surgically absent. Regardless, there are no inflammatory changes noted adjacent to the cecum to suggest the presence of an acute appendicitis at this time. Numerous colonic diverticula are noted, without surrounding inflammatory changes to indicate an acute diverticulitis at this time. Vascular/Lymphatic: Atherosclerosis in the abdominal aorta and pelvic vasculature. No lymphadenopathy noted in the abdomen or pelvis. Reproductive: Status post hysterectomy. Ovaries are not confidently identified may be surgically absent or atrophic. Other: No significant volume of ascites.  No pneumoperitoneum. Musculoskeletal: There are no aggressive appearing lytic or blastic lesions noted in the visualized portions of the skeleton. Spinal fixation hardware in the lumbar spine incidentally noted. IMPRESSION: 1. Right-sided Spigelian hernia containing a portion of the terminal ileum, cecum and proximal ascending colon. No findings to suggest associated bowel obstruction at this time. This likely accounts for the palpable abdominal mass. 2. Colonic diverticulosis without evidence of acute diverticulitis at this time. 3. Aortic atherosclerosis. 4. Cardiomegaly. 5. Additional incidental findings, as above. Electronically Signed   By: Trudie Reed M.D.   On: 01/10/2023 07:26    Anti-infectives: Anti-infectives (From admission, onward)    Start     Dose/Rate Route Frequency Ordered Stop   01/10/23 0945  cefTRIAXone (ROCEPHIN) 1 g in sodium chloride 0.9 % 100 mL IVPB        1 g 200 mL/hr over 30 Minutes Intravenous  Once 01/10/23 0454 01/10/23 1114        Assessment/Plan RLQ incisional hernia -we will plan to proceed to the OR tomorrow pending cards clearance for lap assisted incisional hernia repair.  D/w patient and daughter at bedside -discussed somewhat expected post op recovery -all questions answered -NPO p MN -stop heparin gtt at 0500am   FEN -  HH diet, NPO p MN/IVFs VTE - eliquis on hold, heparin gtt, stop at 0500am 6/26 ID - Rocephin on call to OR  CKD chronic lymphedema atrial fibrillation type II DM GERD Hyperlipidemia HTN OA  I reviewed hospitalist notes, last 24 h vitals and pain scores, last 48 h intake and output, last 24 h labs and trends, and last 24 h imaging results.   LOS: 1 day    Letha Cape , [facility] 01/11/2023, 10:41 AM Please see Amion for pager number during day hours 7:00am-4:30pm or 7:00am -11:30am on weekends

## 2023-01-11 NOTE — Anesthesia Preprocedure Evaluation (Signed)
Anesthesia Evaluation  Patient identified by MRN, date of birth, ID band Patient awake    Reviewed: Allergy & Precautions, NPO status , Patient's Chart, lab work & pertinent test results, reviewed documented beta blocker date and time   History of Anesthesia Complications Negative for: history of anesthetic complications  Airway Mallampati: I  TM Distance: >3 FB Neck ROM: Full    Dental  (+) Dental Advisory Given, Missing   Pulmonary neg pulmonary ROS   breath sounds clear to auscultation       Cardiovascular hypertension, Pt. on medications and Pt. on home beta blockers (-) angina  Rhythm:Irregular Rate:Normal  01/11/2023 ECHo: EF 60-65%, normal LVF, mildly reduced RVF, mild-mod MR, mod-severe TR, no significant aortic valve disease   Neuro/Psych Cervical spondylosis    GI/Hepatic Neg liver ROS,GERD  Medicated and Controlled,,  Endo/Other  negative endocrine ROS    Renal/GU Renal InsufficiencyRenal disease     Musculoskeletal  (+) Arthritis ,    Abdominal   Peds  Hematology  (+) Blood dyscrasia (Hb 9.1, plt 177k), anemia Eliquis: last dose Sunday   Anesthesia Other Findings   Reproductive/Obstetrics                             Anesthesia Physical Anesthesia Plan  ASA: 3  Anesthesia Plan: General   Post-op Pain Management: Tylenol PO (pre-op)*   Induction: Intravenous  PONV Risk Score and Plan: 3 and Ondansetron, Dexamethasone and Treatment may vary due to age or medical condition  Airway Management Planned: Oral ETT  Additional Equipment: None  Intra-op Plan:   Post-operative Plan: Extubation in OR  Informed Consent: I have reviewed the patients History and Physical, chart, labs and discussed the procedure including the risks, benefits and alternatives for the proposed anesthesia with the patient or authorized representative who has indicated his/her understanding and  acceptance.     Dental advisory given  Plan Discussed with: CRNA and Surgeon  Anesthesia Plan Comments:        Anesthesia Quick Evaluation

## 2023-01-11 NOTE — TOC Initial Note (Signed)
Transition of Care [facility]) - Initial/Assessment Note    Patient Details  Name: Tabitha Black MRN: [id number] Date of Birth: 1932-08-07  [facility] of [facility] CM/SW Contact:    Lawerance Sabal, RN Phone Number: 01/11/2023, 4:26 PM  Clinical Narrative:                  Confirmed w [facility] patient is active with RN PT OT.    Expected Discharge Plan: Home w Home Health Services Barriers to Discharge: Continued Medical Work up   Patient Goals and CMS Choice            Expected Discharge Plan and Services                                     [facility] Agency: [facility] Date [facility] Agency Contacted: 01/11/23 Time HH Agency Contacted: 1626 Representative spoke with at [facility] Agency: Haywood Lasso  Prior Living Arrangements/Services                       Activities of Daily Living      Permission Sought/Granted                  Emotional Assessment              Admission diagnosis:  Spigelian hernia [K43.9] Persistent atrial fibrillation (HCC) [I48.19] Abdominal pain [R10.9] Cellulitis of lower extremity, unspecified laterality [L03.119] Patient Active Problem List   Diagnosis Date Noted   Irreducible Spigelian hernia 01/11/2023   Abdominal pain 01/10/2023   GI bleeding 11/12/2022   Cellulitis of right leg 11/12/2022   Lymphedema of both lower extremities - R > L 10/04/2022   HTN (hypertension) 08/23/2022   Persistent atrial fibrillation (HCC) 08/23/2022   DM2 (diabetes mellitus, type 2) (HCC) 08/23/2022   Pain in joint of left knee 01/06/2021   Adverse effect of antihyperlipidemic and antiarteriosclerotic drugs, initial encounter 08/22/2020   Age-related physical debility 08/22/2020   (HFpEF) heart failure with preserved ejection fraction (HCC) 08/22/2020   Chronic kidney disease (CKD), active medical management without dialysis, stage 5 (HCC) 08/22/2020   Diabetic renal disease (HCC) 08/22/2020   Gastroesophageal reflux disease 08/22/2020    History of malignant neoplasm of skin 08/22/2020   Iron deficiency anemia 08/22/2020   Mixed hyperlipidemia 08/22/2020   Osteoarthritis 08/22/2020   Personal history of colonic polyps 08/22/2020   Skin ulcer (HCC) 08/22/2020   Cervical spondylosis with radiculopathy 07/26/2017   PCP:  Irven Coe, MD Pharmacy:   [facility] [phone] [place] Otto, [place] - [address] [place] [address] Phone: [phone] Fax: [phone]     Social Determinants of Health (SDOH) Social History: SDOH Screenings   Food Insecurity: No Food Insecurity (11/12/2022)  Housing: Low Risk  (11/12/2022)  Transportation Needs: No Transportation Needs (11/12/2022)  Utilities: Not At Risk (11/12/2022)  Tobacco Use: Low Risk  (01/10/2023)   SDOH Interventions:     Readmission Risk Interventions    11/16/2022    3:25 PM  Readmission Risk Prevention Plan  Transportation Screening Complete  PCP or Specialist Appt within 3-5 Days Complete  HRI or Home Care Consult Complete  Palliative Care Screening Not Applicable  Medication Review (RN Care Manager) Complete

## 2023-01-12 ENCOUNTER — Inpatient Hospital Stay (HOSPITAL_COMMUNITY): Payer: Medicare Other

## 2023-01-12 ENCOUNTER — Encounter (HOSPITAL_COMMUNITY): Payer: Self-pay | Admitting: Internal Medicine

## 2023-01-12 ENCOUNTER — Other Ambulatory Visit: Payer: Self-pay

## 2023-01-12 ENCOUNTER — Encounter (HOSPITAL_COMMUNITY)
Admission: EM | Disposition: A | Discharge status: Skilled Nursing Facility | Payer: Self-pay | Source: Home / Self Care | Attending: Internal Medicine

## 2023-01-12 DIAGNOSIS — R1031 Right lower quadrant pain: Secondary | ICD-10-CM

## 2023-01-12 DIAGNOSIS — K436 Other and unspecified ventral hernia with obstruction, without gangrene: Secondary | ICD-10-CM

## 2023-01-12 DIAGNOSIS — I890 Lymphedema, not elsewhere classified: Secondary | ICD-10-CM

## 2023-01-12 DIAGNOSIS — N185 Chronic kidney disease, stage 5: Secondary | ICD-10-CM | POA: Diagnosis not present

## 2023-01-12 DIAGNOSIS — I10 Essential (primary) hypertension: Secondary | ICD-10-CM | POA: Diagnosis not present

## 2023-01-12 DIAGNOSIS — K430 Incisional hernia with obstruction, without gangrene: Secondary | ICD-10-CM

## 2023-01-12 DIAGNOSIS — I5032 Chronic diastolic (congestive) heart failure: Secondary | ICD-10-CM | POA: Diagnosis not present

## 2023-01-12 DIAGNOSIS — D649 Anemia, unspecified: Secondary | ICD-10-CM

## 2023-01-12 DIAGNOSIS — I4819 Other persistent atrial fibrillation: Secondary | ICD-10-CM

## 2023-01-12 DIAGNOSIS — I081 Rheumatic disorders of both mitral and tricuspid valves: Secondary | ICD-10-CM

## 2023-01-12 HISTORY — PX: XI ROBOTIC ASSISTED INGUINAL HERNIA REPAIR WITH MESH: SHX6706

## 2023-01-12 LAB — RENAL FUNCTION PANEL
Albumin: 3 g/dL — ABNORMAL LOW (ref 3.5–5.0)
Anion gap: 17 — ABNORMAL HIGH (ref 5–15)
BUN: 101 mg/dL — ABNORMAL HIGH (ref 8–23)
CO2: 25 mmol/L (ref 22–32)
Calcium: 9.4 mg/dL (ref 8.9–10.3)
Chloride: 93 mmol/L — ABNORMAL LOW (ref 98–111)
Creatinine, Ser: 4.16 mg/dL — ABNORMAL HIGH (ref 0.44–1.00)
GFR, Estimated: 10 mL/min — ABNORMAL LOW (ref >60)
Glucose, Bld: 114 mg/dL — ABNORMAL HIGH (ref 70–99)
Phosphorus: 4.9 mg/dL — ABNORMAL HIGH (ref 2.5–4.6)
Potassium: 3.5 mmol/L (ref 3.5–5.1)
Sodium: 135 mmol/L (ref 135–145)

## 2023-01-12 LAB — COMPREHENSIVE METABOLIC PANEL
ALT: 14 U/L (ref 0–44)
AST: 27 U/L (ref 15–41)
Albumin: 3.1 g/dL — ABNORMAL LOW (ref 3.5–5.0)
Alkaline Phosphatase: 27 U/L — ABNORMAL LOW (ref 38–126)
Anion gap: 19 — ABNORMAL HIGH (ref 5–15)
BUN: 99 mg/dL — ABNORMAL HIGH (ref 8–23)
CO2: 23 mmol/L (ref 22–32)
Calcium: 9.6 mg/dL (ref 8.9–10.3)
Chloride: 94 mmol/L — ABNORMAL LOW (ref 98–111)
Creatinine, Ser: 4.09 mg/dL — ABNORMAL HIGH (ref 0.44–1.00)
GFR, Estimated: 10 mL/min — ABNORMAL LOW (ref >60)
Glucose, Bld: 110 mg/dL — ABNORMAL HIGH (ref 70–99)
Potassium: 3.7 mmol/L (ref 3.5–5.1)
Sodium: 136 mmol/L (ref 135–145)
Total Bilirubin: 1.4 mg/dL — ABNORMAL HIGH (ref 0.3–1.2)
Total Protein: 6.4 g/dL — ABNORMAL LOW (ref 6.5–8.1)

## 2023-01-12 LAB — GLUCOSE, CAPILLARY
Glucose-Capillary: 123 mg/dL — ABNORMAL HIGH (ref 70–99)
Glucose-Capillary: 110 mg/dL — ABNORMAL HIGH (ref 70–99)
Glucose-Capillary: 135 mg/dL — ABNORMAL HIGH (ref 70–99)
Glucose-Capillary: 165 mg/dL — ABNORMAL HIGH (ref 70–99)
Glucose-Capillary: 158 mg/dL — ABNORMAL HIGH (ref 70–99)
Glucose-Capillary: 147 mg/dL — ABNORMAL HIGH (ref 70–99)

## 2023-01-12 LAB — CBC
HCT: 29.1 % — ABNORMAL LOW (ref 36.0–46.0)
Hemoglobin: 9.4 g/dL — ABNORMAL LOW (ref 12.0–15.0)
MCH: 29.7 pg (ref 26.0–34.0)
MCHC: 32.3 g/dL (ref 30.0–36.0)
MCV: 92.1 fL (ref 80.0–100.0)
Platelets: 174 10*3/uL (ref 150–400)
RBC: 3.16 MIL/uL — ABNORMAL LOW (ref 3.87–5.11)
RDW: 14.7 % (ref 11.5–15.5)
WBC: 6.5 10*3/uL (ref 4.0–10.5)
nRBC: 0 % (ref 0.0–0.2)

## 2023-01-12 LAB — MAGNESIUM: Magnesium: 1.7 mg/dL (ref 1.7–2.4)

## 2023-01-12 SURGERY — REPAIR, HERNIA, INGUINAL, ROBOT-ASSISTED, LAPAROSCOPIC, USING MESH
Anesthesia: General | Site: Abdomen

## 2023-01-12 MED ORDER — PROPOFOL 10 MG/ML IV BOLUS
INTRAVENOUS | Status: DC | PRN
  Administered 2023-01-12: 100 mg via INTRAVENOUS

## 2023-01-12 MED ORDER — SODIUM CHLORIDE 0.9 % IV SOLN
INTRAVENOUS | Status: AC
  Filled 2023-01-12: qty 20

## 2023-01-12 MED ORDER — BUPIVACAINE LIPOSOME 1.3 % IJ SUSP
INTRAMUSCULAR | Status: AC
  Filled 2023-01-12: qty 20

## 2023-01-12 MED ORDER — PROPOFOL 10 MG/ML IV BOLUS
INTRAVENOUS | Status: AC
  Filled 2023-01-12: qty 20

## 2023-01-12 MED ORDER — ONDANSETRON 4 MG PO TBDP: 4.0000 mg | ORAL_TABLET | Freq: Four times a day (QID) | ORAL | Status: DC | PRN

## 2023-01-12 MED ORDER — CEPHALEXIN 500 MG PO CAPS
500.0000 mg | ORAL_CAPSULE | Freq: Two times a day (BID) | ORAL | Status: DC
  Administered 2023-01-12 – 2023-01-14 (×4): 500 mg via ORAL
  Filled 2023-01-12 (×4): qty 1

## 2023-01-12 MED ORDER — BUPIVACAINE HCL (PF) 0.25 % IJ SOLN
INTRAMUSCULAR | Status: AC
  Filled 2023-01-12: qty 30

## 2023-01-12 MED ORDER — 0.9 % SODIUM CHLORIDE (POUR BTL) OPTIME
TOPICAL | Status: DC | PRN
  Administered 2023-01-12: 1000 mL

## 2023-01-12 MED ORDER — ORAL CARE MOUTH RINSE: 15.0000 mL | Freq: Once | OROMUCOSAL | Status: AC

## 2023-01-12 MED ORDER — SUGAMMADEX SODIUM 200 MG/2ML IV SOLN
INTRAVENOUS | Status: DC | PRN
  Administered 2023-01-12 (×2): 50 mg via INTRAVENOUS
  Administered 2023-01-12: 200 mg via INTRAVENOUS

## 2023-01-12 MED ORDER — ROCURONIUM BROMIDE 10 MG/ML (PF) SYRINGE
PREFILLED_SYRINGE | INTRAVENOUS | Status: DC | PRN
  Administered 2023-01-12: 50 mg via INTRAVENOUS
  Administered 2023-01-12 (×2): 20 mg via INTRAVENOUS

## 2023-01-12 MED ORDER — ONDANSETRON HCL 4 MG/2ML IJ SOLN
INTRAMUSCULAR | Status: DC | PRN
  Administered 2023-01-12: 4 mg via INTRAVENOUS

## 2023-01-12 MED ORDER — FENTANYL CITRATE (PF) 250 MCG/5ML IJ SOLN
INTRAMUSCULAR | Status: DC | PRN
  Administered 2023-01-12: 50 ug via INTRAVENOUS
  Administered 2023-01-12: 100 ug via INTRAVENOUS

## 2023-01-12 MED ORDER — OXYCODONE HCL 5 MG PO TABS
5.0000 mg | ORAL_TABLET | ORAL | Status: DC | PRN
  Administered 2023-01-13: 10 mg via ORAL
  Administered 2023-01-14 (×2): 5 mg via ORAL
  Filled 2023-01-12: qty 2
  Filled 2023-01-12 (×2): qty 1

## 2023-01-12 MED ORDER — ONDANSETRON HCL 4 MG/2ML IJ SOLN: 4.0000 mg | Freq: Four times a day (QID) | INTRAMUSCULAR | Status: DC | PRN

## 2023-01-12 MED ORDER — DEXAMETHASONE SODIUM PHOSPHATE 10 MG/ML IJ SOLN
INTRAMUSCULAR | Status: DC | PRN
  Administered 2023-01-12: 5 mg via INTRAVENOUS

## 2023-01-12 MED ORDER — ENOXAPARIN SODIUM 40 MG/0.4ML IJ SOSY: 40.0000 mg | PREFILLED_SYRINGE | INTRAMUSCULAR | Status: DC

## 2023-01-12 MED ORDER — SODIUM CHLORIDE (PF) 0.9 % IJ SOLN
INTRAMUSCULAR | Status: DC | PRN
  Administered 2023-01-12: 40 mL

## 2023-01-12 MED ORDER — CHLORHEXIDINE GLUCONATE 0.12 % MT SOLN
15.0000 mL | Freq: Once | OROMUCOSAL | Status: AC
  Administered 2023-01-12: 15 mL via OROMUCOSAL
  Filled 2023-01-12: qty 15

## 2023-01-12 MED ORDER — BUPIVACAINE HCL 0.25 % IJ SOLN
INTRAMUSCULAR | Status: DC | PRN
  Administered 2023-01-12: 6 mL

## 2023-01-12 MED ORDER — DEXTROSE-SODIUM CHLORIDE 5-0.9 % IV SOLN: INTRAVENOUS | Status: DC

## 2023-01-12 MED ORDER — APIXABAN 2.5 MG PO TABS: 2.5000 mg | ORAL_TABLET | Freq: Two times a day (BID) | ORAL | Status: DC

## 2023-01-12 MED ORDER — FENTANYL CITRATE (PF) 250 MCG/5ML IJ SOLN
INTRAMUSCULAR | Status: AC
  Filled 2023-01-12: qty 5

## 2023-01-12 MED ORDER — LIDOCAINE 2% (20 MG/ML) 5 ML SYRINGE
INTRAMUSCULAR | Status: DC | PRN
  Administered 2023-01-12: 40 mg via INTRAVENOUS

## 2023-01-12 MED ORDER — PHENYLEPHRINE 80 MCG/ML (10ML) SYRINGE FOR IV PUSH (FOR BLOOD PRESSURE SUPPORT)
PREFILLED_SYRINGE | INTRAVENOUS | Status: DC | PRN
  Administered 2023-01-12 (×3): 160 ug via INTRAVENOUS
  Administered 2023-01-12: 240 ug via INTRAVENOUS
  Administered 2023-01-12: 80 ug via INTRAVENOUS

## 2023-01-12 MED ORDER — EPHEDRINE SULFATE-NACL 50-0.9 MG/10ML-% IV SOSY
PREFILLED_SYRINGE | INTRAVENOUS | Status: DC | PRN
  Administered 2023-01-12: 2.5 mg via INTRAVENOUS

## 2023-01-12 MED ORDER — SODIUM CHLORIDE 0.9 % IV SOLN: INTRAVENOUS | Status: DC

## 2023-01-12 SURGICAL SUPPLY — 56 items
ADH SKN CLS APL DERMABOND .7 (GAUZE/BANDAGES/DRESSINGS) ×1
APL PRP STRL LF DISP 70% ISPRP (MISCELLANEOUS) ×1
BAG COUNTER SPONGE SURGICOUNT (BAG) IMPLANT
BAG SPNG CNTER NS LX DISP (BAG)
CHLORAPREP W/TINT 26 (MISCELLANEOUS) ×1 IMPLANT
COVER MAYO STAND STRL (DRAPES) ×1 IMPLANT
COVER SURGICAL LIGHT HANDLE (MISCELLANEOUS) ×1 IMPLANT
COVER TIP SHEARS 8 DVNC (MISCELLANEOUS) ×1 IMPLANT
DEFOGGER SCOPE WARMER CLEARIFY (MISCELLANEOUS) ×1 IMPLANT
DERMABOND ADVANCED .7 DNX12 (GAUZE/BANDAGES/DRESSINGS) ×1 IMPLANT
DEVICE TROCAR PUNCTURE CLOSURE (ENDOMECHANICALS) ×1 IMPLANT
DRAPE ARM DVNC X/XI (DISPOSABLE) ×4 IMPLANT
DRAPE COLUMN DVNC XI (DISPOSABLE) ×1 IMPLANT
DRAPE CV SPLIT W-CLR ANES SCRN (DRAPES) ×1 IMPLANT
DRAPE ORTHO SPLIT 77X108 STRL (DRAPES) ×1
DRAPE SURG ORHT 6 SPLT 77X108 (DRAPES) ×1 IMPLANT
DRIVER NDL MEGA SUTCUT DVNCXI (INSTRUMENTS) ×1 IMPLANT
DRIVER NDLE MEGA SUTCUT DVNCXI (INSTRUMENTS) ×1 IMPLANT
ELECT REM PT RETURN 9FT ADLT (ELECTROSURGICAL) ×1
ELECTRODE REM PT RTRN 9FT ADLT (ELECTROSURGICAL) ×1 IMPLANT
FORCEPS PROGRASP DVNC XI (FORCEP) ×1 IMPLANT
GLOVE BIO SURGEON STRL SZ7.5 (GLOVE) ×5 IMPLANT
GOWN STRL REUS W/ TWL LRG LVL3 (GOWN DISPOSABLE) ×2 IMPLANT
GOWN STRL REUS W/ TWL XL LVL3 (GOWN DISPOSABLE) ×2 IMPLANT
GOWN STRL REUS W/TWL 2XL LVL3 (GOWN DISPOSABLE) ×1 IMPLANT
GOWN STRL REUS W/TWL LRG LVL3 (GOWN DISPOSABLE) ×2
GOWN STRL REUS W/TWL XL LVL3 (GOWN DISPOSABLE) ×2
IRRIG SUCT STRYKERFLOW 2 WTIP (MISCELLANEOUS)
IRRIGATION SUCT STRKRFLW 2 WTP (MISCELLANEOUS) IMPLANT
KIT BASIN OR (CUSTOM PROCEDURE TRAY) ×1 IMPLANT
KIT TURNOVER KIT B (KITS) ×1 IMPLANT
MARKER SKIN DUAL TIP RULER LAB (MISCELLANEOUS) ×1 IMPLANT
MESH PROGRIP LAP SELF FIXATING (Mesh General) ×1 IMPLANT
MESH PROGRIP LAP SLF FIX 16X12 (Mesh General) ×1 IMPLANT
NDL HYPO 22X1.5 SAFETY MO (MISCELLANEOUS) ×1 IMPLANT
NDL INSUFFLATION 14GA 120MM (NEEDLE) ×1 IMPLANT
NEEDLE HYPO 22X1.5 SAFETY MO (MISCELLANEOUS) ×1 IMPLANT
NEEDLE INSUFFLATION 14GA 120MM (NEEDLE) ×1 IMPLANT
OBTURATOR OPTICAL STND 8 DVNC (TROCAR)
OBTURATOR OPTICALSTD 8 DVNC (TROCAR) IMPLANT
PAD ARMBOARD 7.5X6 YLW CONV (MISCELLANEOUS) ×2 IMPLANT
RETRACTOR GRSP SML 8 DVNC XI (INSTRUMENTS) IMPLANT
SCISSORS LAP 5X35 DISP (ENDOMECHANICALS) IMPLANT
SCISSORS MNPLR CVD DVNC XI (INSTRUMENTS) ×1 IMPLANT
SEAL UNIV 5-12 XI (MISCELLANEOUS) ×2 IMPLANT
SET TUBE SMOKE EVAC HIGH FLOW (TUBING) ×1 IMPLANT
SPIKE FLUID TRANSFER (MISCELLANEOUS) ×1 IMPLANT
STOPCOCK 4 WAY LG BORE MALE ST (IV SETS) ×1 IMPLANT
SUT MNCRL AB 4-0 PS2 18 (SUTURE) ×1 IMPLANT
SUT VIC AB 2-0 SH 27 (SUTURE)
SUT VIC AB 2-0 SH 27X BRD (SUTURE) IMPLANT
SUT VLOC 180 2-0 6IN GS21 (SUTURE) ×1 IMPLANT
SUT VLOC 180 2-0 9IN GS21 (SUTURE) ×1 IMPLANT
SYR 30ML SLIP (SYRINGE) ×1 IMPLANT
TOWEL GREEN STERILE FF (TOWEL DISPOSABLE) ×1 IMPLANT
TRAY LAPAROSCOPIC MC (CUSTOM PROCEDURE TRAY) ×1 IMPLANT

## 2023-01-12 NOTE — Progress Notes (Signed)
Pt glasses left in pt room with daughter

## 2023-01-12 NOTE — Transfer of Care (Signed)
Immediate Anesthesia Transfer of Care Note  Patient: Tabitha Black  Procedure(s) Performed: XI ROBOTIC ASSISTED INCISIONAL  HERNIA REPAIR WITH MESH (Abdomen)  Patient Location: PACU  Anesthesia Type:General  Level of Consciousness: drowsy, patient cooperative, and responds to stimulation  Airway & Oxygen Therapy: Patient Spontanous Breathing and Patient connected to face mask oxygen  Post-op Assessment: Report given to RN, Post -op Vital signs reviewed and stable, and Patient moving all extremities X 4  Post vital signs: Reviewed and stable  Last Vitals:  Vitals Value Taken Time  BP 142/55 01/12/23 1230  Temp    Pulse 100 01/12/23 1231  Resp    SpO2 100 % 01/12/23 1231  Vitals shown include unvalidated device data.  Last Pain:  Vitals:   01/12/23 0912  TempSrc:   PainSc: 0-No pain         Complications: No notable events documented.

## 2023-01-12 NOTE — Progress Notes (Addendum)
PROGRESS NOTE    Tabitha Black  WGN:[id number] DOB: 1933/04/14 DOA: 01/10/2023 PCP: Irven Coe, MD   Brief Narrative:  The patient is a 89 year old Caucasian female with a past medical history significant for but limited to diastolic congestive heart failure, atrial fibrillation on anticoagulation with Eliquis, late stage CKD stage IV early 5, GYN, hyperlipidemia, history of multiple abdominal surgeries as well as right abdominal hernia and bilateral lower extremity lymphedema who presented with intermittent abdominal pain for about a week that became persistent on the day of presentation.  A right-sided hernia is not reducible and CT of the abdomen pelvis was done and revealed the right sided Spigelian manage contained a portion of the terminal ileum, cecum and proximal ascending colon without bowel obstruction.  She was noted to be hemodynamically stable and creatinine was around her baseline with an elevated BUN of 110.  General surgery was consulted in the plan for surgical intervention on 01/12/2023 and cardiology was consulted for cardiac clearance as well as nephrology being consulted for AKI on CKD and azotemia.  She is now out of the operating room and having some abdominal discomfort.  Her left foot remains erythematous and warm and she is being treated for outpatient cellulitis which will continue antibiotics now.  She is being admitted and treated for the following but not limited to:   Assessment and Plan:  Abdominal pain due to irreducible spigelian hernia -Patient with known history of abdominal hernia.  -Previously reducible.  Presents with significant pain.  No evidence of obstruction.  Passing flatus.  No bowel movement since admission.  No nausea or vomiting. -General surgery following-plan for laparoscopic assisted incisional hernia repair on 6/26. -Cardiology has been consulted for cardiac clearance  -N.p.o. after midnight further diet advancement per general surgery now -Pain  control.   Chronic Diastolic CHF -Appears euvolemic except for chronic BLE lymphedema.  TTE in 2018 with LVEF of 60 to 65%, no RWMA but G2 DD.  On Lasix and metolazone at home.   -Converted to IV Lasix on admission.  Creatinine improving. -Continued IV Lasix 80 mg twice daily but held by Nephrology -Cardiology consulted for preoperative clearance and she had an increased moderate risk -Strict intake and output, daily weights, renal functions and electrolytes  Intake/Output Summary (Last 24 hours) at 01/12/2023 1743 Last data filed at 01/12/2023 1527 Gross per 24 hour  Intake 772.14 ml  Output 1000 ml  Net -227.86 ml     Bilateral Lower Extremity Lymphedema with concern for Right Foot Cellulitis -Resume Abx for Right Foot Cellulitis  -Diuretics as above being held    CKD-V/Azotemia Elevated Anion Gap -Not uremic.  Followed by Dr. Allena Katz. Recent Labs  Lab 01/04/23 1346 01/10/23 0639 01/10/23 0651 01/11/23 0046 01/12/23 0011 01/12/23 0659  BUN 97* 104* 110* 100* 101* 99*  CREATININE 3.90* 3.89* 4.00* 3.89* 4.16* 4.09*  -Nephrology consulted and will see patient -Monitor daily   Bone Mineral Disorder -Has mild hyperphosphatemia. -May need Phos binder but defer to nephrology   Anemia of Chronic Disease -Hgb/Hct Trend: Recent Labs  Lab 12/21/22 1232 01/04/23 1242 01/04/23 1346 01/10/23 0639 01/10/23 0651 01/11/23 0046 01/12/23 0011  HGB 9.9* 9.8* 10.0* 9.9* 11.6* 9.1* 9.4*  HCT  --   --  31.8* 30.7* 34.0* 28.2* 29.1*  MCV  --   --  93.8 96.2  --  94.9 92.1  -Check anemia panel in the morning -ESA per nephrology if deemed appropriate -Continue to monitor for S/Sx of Bleeding; No  overt bleeding noted   Persistent atrial fibrillation -On Eliquis at home.  Rate controlled. -Continue metoprolol -IV heparin for anticoagulation.  Eliquis on hold for surgery. -Optimize electrolytes   Hypertension -Normotensive for most part -Continue current regimen -Continue to  Monitor BP per Protocol   Hypomagnesemia -Patient's Mag Level Trend: Recent Labs  Lab 01/10/23 1339 01/11/23 0046 01/12/23 0011  MG 1.4* 1.4* 1.7  -Continue to Monitor and Replete as Necessary -Repeat Mag in the AM   Generalized weakness/Physical Deconditioning -PT/OT to further evaluate and Treat   DVT prophylaxis: apixaban (ELIQUIS) tablet 2.5 mg Start: 01/13/23 1000 SCD's Start: 01/12/23 1341 apixaban (ELIQUIS) tablet 2.5 mg    Code Status: Full Code Family Communication: Discussed with Daughter at bedside   Disposition Plan:  Level of care: Telemetry Medical Status is: Inpatient Remains inpatient appropriate because: Further clinical improvement and clearance by general surgery and specialists   Consultants:  General Surgery Nephrology Cardiology  Procedures:  PROCEDURE:  Procedure(s): XI ROBOTIC ASSISTED INCISIONAL  HERNIA REPAIR WITH MESH (N/A) TAPP  Antimicrobials:  Anti-infectives (From admission, onward)    Start     Dose/Rate Route Frequency Ordered Stop   01/12/23 2200  cephALEXin (KEFLEX) capsule 500 mg        500 mg Oral 2 times daily 01/12/23 1800     01/12/23 1019  sodium chloride 0.9 % with cefTRIAXone (ROCEPHIN) ADS Med       Note to Pharmacy: Susy [facility] L: cabinet override      01/12/23 1019 01/12/23 1047   01/12/23 0600  cefTRIAXone (ROCEPHIN) 2 g in sodium chloride 0.9 % 100 mL IVPB        2 g 200 mL/hr over 30 Minutes Intravenous On call to O.R. 01/11/23 1045 01/12/23 1042   01/10/23 0945  cefTRIAXone (ROCEPHIN) 1 g in sodium chloride 0.9 % 100 mL IVPB        1 g 200 mL/hr over 30 Minutes Intravenous  Once 01/10/23 0938 01/10/23 1114       Subjective: And examined at bedside and she was having some abdominal discomfort and just come from surgery.  States that she felt okay.  No chest pain or shortness of breath.  Denies any lightheadedness or dizziness.  No other concerns or complaints this time.  Objective: Vitals:   01/12/23 1300  01/12/23 1315 01/12/23 1336 01/12/23 1641  BP: (!) 119/50 (!) 129/48 (!) 132/55 (!) 145/79  Pulse: 83 82 82 87  Resp: 16 17 17 18   Temp:  98.2 F (36.8 C) 98.3 F (36.8 C) 97.7 F (36.5 C)  TempSrc:   Oral Oral  SpO2: 95% 93% 95% 97%  Weight:      Height:        Intake/Output Summary (Last 24 hours) at 01/12/2023 1828 Last data filed at 01/12/2023 1527 Gross per 24 hour  Intake 772.14 ml  Output 1000 ml  Net -227.86 ml   Filed Weights   01/10/23 0640 01/12/23 0346 01/12/23 0904  Weight: 70.3 kg 72.5 kg 72.5 kg   Examination: Physical Exam:  Constitutional: WN/WD overweight chronically ill-appearing Caucasian female who appears a little uncomfortable Respiratory: Diminished to auscultation bilaterally, no wheezing, rales, rhonchi or crackles. Normal respiratory effort and patient is not tachypenic. No accessory muscle use.  Unlabored breathing Cardiovascular: RRR, no murmurs / rubs / gallops. S1 and S2 auscultated.  Has some lower extremity edema Abdomen: Soft, non-tender, distended secondary to body habitus. Bowel sounds positive.  GU: Deferred. Musculoskeletal: No clubbing /  cyanosis of digits/nails. No joint deformity upper and lower extremities. .  Skin: No rashes, lesions, ulcers on limited skin evaluation. No induration; Warm and dry.  Neurologic: CN 2-12 grossly intact with no focal deficits. Romberg sign and cerebellar reflexes not assessed.  Psychiatric: Normal judgment and insight. Alert and oriented x 3. Normal mood and appropriate affect.   Data Reviewed: I have personally reviewed following labs and imaging studies  CBC: Recent Labs  Lab 01/10/23 0639 01/10/23 0651 01/11/23 0046 01/12/23 0011  WBC 4.7  --  5.2 6.5  NEUTROABS 2.2  --   --   --   HGB 9.9* 11.6* 9.1* 9.4*  HCT 30.7* 34.0* 28.2* 29.1*  MCV 96.2  --  94.9 92.1  PLT 183  --  177 174   Basic Metabolic Panel: Recent Labs  Lab 01/10/23 0639 01/10/23 0651 01/10/23 1339 01/11/23 0046  01/12/23 0011 01/12/23 0659  NA 134* 137  --  134* 135 136  K 3.5 3.6  --  3.7 3.5 3.7  CL 96* 97*  --  97* 93* 94*  CO2 23  --   --  25 25 23   GLUCOSE 119* 118*  --  96 114* 110*  BUN 104* 110*  --  100* 101* 99*  CREATININE 3.89* 4.00*  --  3.89* 4.16* 4.09*  CALCIUM 9.2  --   --  8.9 9.4 9.6  MG  --   --  1.4* 1.4* 1.7  --   PHOS  --   --  5.2* 5.4* 4.9*  --    GFR: Estimated Creatinine Clearance: 9.2 mL/min (A) (by C-G formula based on SCr of 4.09 mg/dL (H)). Liver Function Tests: Recent Labs  Lab 01/10/23 0639 01/11/23 0046 01/12/23 0011 01/12/23 0659  AST 26  --   --  27  ALT 13  --   --  14  ALKPHOS 28*  --   --  27*  BILITOT 0.8  --   --  1.4*  PROT 6.7  --   --  6.4*  ALBUMIN 3.5 2.9* 3.0* 3.1*   Recent Labs  Lab 01/10/23 0639  LIPASE 44   No results for input(s): "AMMONIA" in the last 168 hours. Coagulation Profile: No results for input(s): "INR", "PROTIME" in the last 168 hours. Cardiac Enzymes: No results for input(s): "CKTOTAL", "CKMB", "CKMBINDEX", "TROPONINI" in the last 168 hours. BNP (last 3 results) No results for input(s): "PROBNP" in the last 8760 hours. HbA1C: No results for input(s): "HGBA1C" in the last 72 hours. CBG: Recent Labs  Lab 01/11/23 1700 01/12/23 0750 01/12/23 1017 01/12/23 1235 01/12/23 1642  GLUCAP 103* 123* 110* 135* 165*   Lipid Profile: No results for input(s): "CHOL", "HDL", "LDLCALC", "TRIG", "CHOLHDL", "LDLDIRECT" in the last 72 hours. Thyroid Function Tests: No results for input(s): "TSH", "T4TOTAL", "FREET4", "T3FREE", "THYROIDAB" in the last 72 hours. Anemia Panel: No results for input(s): "VITAMINB12", "FOLATE", "FERRITIN", "TIBC", "IRON", "RETICCTPCT" in the last 72 hours. Sepsis Labs: Recent Labs  Lab 01/10/23 0735 01/10/23 0846  LATICACIDVEN 1.1 1.0    No results found for this or any previous visit (from the past 240 hour(s)).   Radiology Studies: ECHOCARDIOGRAM COMPLETE  Result Date:  01/11/2023    ECHOCARDIOGRAM REPORT   Patient Name:   NARE GASPARI Date of Exam: 01/11/2023 Medical Rec #:  [id number]     Height:       63.0 in Accession #:    [id number]    Weight:  155.0 lb Date of Birth:  January 18, 1933     BSA:          1.735 m Patient Age:    89 years      BP:           139/52 mmHg Patient Gender: F             HR:           82 bpm. Exam Location:  Inpatient Procedure: 2D Echo, Cardiac Doppler and Color Doppler Indications:    Dyspnea R06.00  History:        Patient has no prior history of Echocardiogram examinations.                 Arrythmias:Atrial Fibrillation; Risk Factors:Hypertension,                 Diabetes and Dyslipidemia. CKD, stage 5.  Sonographer:    Lucendia Herrlich Referring Phys: [phone] HAO MENG IMPRESSIONS  1. Left ventricular ejection fraction, by estimation, is 60 to 65%. The left ventricle has normal function. The left ventricle has no regional wall motion abnormalities. Left ventricular diastolic parameters are indeterminate.  2. Right ventricular systolic function is mildly reduced. The right ventricular size is severely enlarged. There is severely elevated pulmonary artery systolic pressure.  3. Left atrial size was severely dilated.  4. Right atrial size was severely dilated.  5. The mitral valve is normal in structure. Mild to moderate mitral valve regurgitation. No evidence of mitral stenosis.  6. Tricuspid valve regurgitation is moderate to severe.  7. The aortic valve is normal in structure. Aortic valve regurgitation is not visualized. Aortic valve sclerosis is present, with no evidence of aortic valve stenosis.  8. The inferior vena cava is dilated in size with <50% respiratory variability, suggesting right atrial pressure of 15 mmHg. FINDINGS  Left Ventricle: Left ventricular ejection fraction, by estimation, is 60 to 65%. The left ventricle has normal function. The left ventricle has no regional wall motion abnormalities. The left ventricular internal cavity  size was normal in size. There is  no left ventricular hypertrophy. Left ventricular diastolic parameters are indeterminate. Right Ventricle: The right ventricular size is severely enlarged. No increase in right ventricular wall thickness. Right ventricular systolic function is mildly reduced. There is severely elevated pulmonary artery systolic pressure. The tricuspid regurgitant velocity is 3.60 m/s, and with an assumed right atrial pressure of 15 mmHg, the estimated right ventricular systolic pressure is 66.8 mmHg. Left Atrium: Left atrial size was severely dilated. Right Atrium: Right atrial size was severely dilated. Pericardium: There is no evidence of pericardial effusion. Mitral Valve: The mitral valve is normal in structure. Mild to moderate mitral valve regurgitation. No evidence of mitral valve stenosis. Tricuspid Valve: The tricuspid valve is normal in structure. Tricuspid valve regurgitation is moderate to severe. No evidence of tricuspid stenosis. Aortic Valve: The aortic valve is normal in structure. Aortic valve regurgitation is not visualized. Aortic valve sclerosis is present, with no evidence of aortic valve stenosis. Aortic valve mean gradient measures 7.0 mmHg. Aortic valve peak gradient measures 12.5 mmHg. Aortic valve area, by VTI measures 1.71 cm. Pulmonic Valve: The pulmonic valve was normal in structure. Pulmonic valve regurgitation is trivial. No evidence of pulmonic stenosis. Aorta: The aortic root is normal in size and structure. Venous: The inferior vena cava is dilated in size with less than 50% respiratory variability, suggesting right atrial pressure of 15 mmHg. IAS/Shunts: No atrial level shunt detected by color  flow Doppler.  LEFT VENTRICLE PLAX 2D LVIDd:         4.40 cm   Diastology LVIDs:         2.50 cm   LV e' medial:    7.10 cm/s LV PW:         0.90 cm   LV E/e' medial:  14.1 LV IVS:        0.90 cm   LV e' lateral:   12.20 cm/s LVOT diam:     1.80 cm   LV E/e' lateral: 8.2 LV  SV:         54 LV SV Index:   31 LVOT Area:     2.54 cm  RIGHT VENTRICLE             IVC RV S prime:     14.30 cm/s  IVC diam: 2.80 cm TAPSE (M-mode): 1.5 cm LEFT ATRIUM              Index        RIGHT ATRIUM           Index LA diam:        4.90 cm  2.82 cm/m   RA Area:     30.70 cm LA Vol (A2C):   99.0 ml  57.06 ml/m  RA Volume:   105.80 ml 60.98 ml/m LA Vol (A4C):   111.0 ml 63.97 ml/m LA Biplane Vol: 114.0 ml 65.70 ml/m  AORTIC VALVE AV Area (Vmax):    1.84 cm AV Area (Vmean):   1.74 cm AV Area (VTI):     1.71 cm AV Vmax:           177.00 cm/s AV Vmean:          116.000 cm/s AV VTI:            0.318 m AV Peak Grad:      12.5 mmHg AV Mean Grad:      7.0 mmHg LVOT Vmax:         127.80 cm/s LVOT Vmean:        79.120 cm/s LVOT VTI:          0.213 m LVOT/AV VTI ratio: 0.67  AORTA Ao Root diam: 2.90 cm Ao Asc diam:  2.90 cm MITRAL VALVE               TRICUSPID VALVE MV Area (PHT): 4.21 cm    TR Peak grad:   51.8 mmHg MV Decel Time: 180 msec    TR Vmax:        360.00 cm/s MR Peak grad: 63.4 mmHg MR Vmax:      398.00 cm/s  SHUNTS MV E velocity: 99.80 cm/s  Systemic VTI:  0.21 m MV A velocity: 39.30 cm/s  Systemic Diam: 1.80 cm MV E/A ratio:  2.54 Kardie Tobb DO Electronically signed by Thomasene Ripple DO Signature Date/Time: 01/11/2023/8:09:24 PM    Final     Scheduled Meds:  [START ON 01/13/2023] apixaban  2.5 mg Oral BID   cephALEXin  500 mg Oral BID   insulin aspart  0-5 Units Subcutaneous QHS   insulin aspart  0-6 Units Subcutaneous TID WC   isosorbide mononitrate  15 mg Oral QHS   metoprolol succinate  50 mg Oral QPM   pantoprazole  40 mg Oral Daily   Continuous Infusions:  dextrose 5 % and 0.9 % NaCl 75 mL/hr at 01/12/23 1428    LOS: 2 days   Marguerita Merles, DO  [facility] Available via Epic secure chat 7am-7pm After these hours, please refer to coverage provider listed on [email or web address] 01/12/2023, 6:28 PM

## 2023-01-12 NOTE — Anesthesia Procedure Notes (Signed)
Procedure Name: Intubation Date/Time: 01/12/2023 10:39 AM  Performed by: Aundria Rud, CRNAPre-anesthesia Checklist: Patient identified, Emergency Drugs available, Suction available and Patient being monitored Patient Re-evaluated:Patient Re-evaluated prior to induction Oxygen Delivery Method: Circle System Utilized Preoxygenation: Pre-oxygenation with 100% oxygen Induction Type: IV induction Ventilation: Mask ventilation without difficulty Laryngoscope Size: Mac and 3 Grade View: Grade I Tube type: Oral Tube size: 7.0 mm Number of attempts: 1 Airway Equipment and Method: Stylet Placement Confirmation: ETT inserted through vocal cords under direct vision, positive ETCO2 and breath sounds checked- equal and bilateral Secured at: 21 cm Tube secured with: Tape Dental Injury: Teeth and Oropharynx as per pre-operative assessment

## 2023-01-12 NOTE — Progress Notes (Signed)
Received patient awake and alert s/p hernia repair. Patient on room air, VSS. 3 trocar sites approximated with skin glue. Orders reviewed. NAD at this time.

## 2023-01-12 NOTE — Progress Notes (Signed)
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 Underlying CKD presumed to be secondary to HTN and CRS -AKI likely secondary to fluctuating volume status/CRS. Cr up to 4.1, will hold lasix for now -no obstruction on CT. UA bland -no indication for dialysis at this time, volume status is stable and not exhibiting any uremic symptoms -renal replacement therapy candidacy: patient, daughter, and I had an extensive conversation in regards to renal replacement therapy on 6/25. I did express my concerns in regards to her functional status and advanced age. Nancee has been thinking about this for quite some time. I did discuss process of initiation and timing. I also discussed NOT doing dialysis as well. They have a lot to discuss and think about. Will continue to advance conversations  -Avoid nephrotoxic medications including NSAIDs and iodinated intravenous contrast exposure unless the latter is absolutely indicated.  Preferred narcotic agents for pain control are hydromorphone, fentanyl, and methadone. Morphine should not be used. Avoid Baclofen and avoid oral sodium phosphate and magnesium citrate based laxatives / bowel preps. Continue strict Input and Output monitoring. Will monitor the patient closely with you and intervene or adjust therapy as indicated by changes in clinical status/labs    RLQ Hernia -surgery following, s/p hernia repair with mesh 6/26   Chronic diastolic CHF Lymphedema -d/c'ed lasix. Monitor daily weights and strict I/O.Will reassess in regards to diuretic dosing   HTN -BP currently acceptable   Anemia of CKD -receives ESA outpatient. Retacrit 10,000 units every 2 weeks, last dose 6/18. Hgb currently 9.4. Due for ESA next week   Secondary hyperparathyroidism, CKD-MBD -renal diet for now, PO4 acceptable  Subjective:   Patient seen and examined bedside. She is post op,  currently groggy. Daughter and patient were in the process of discussing about dialysis but ultimately it's the patient's decision. Discussed with patient's daughter at the bedside   Objective:   BP (!) 132/55 (BP Location: Right Arm)   Pulse 82   Temp 98.3 F (36.8 C) (Oral)   Resp 17   Ht 5\' 3"  (1.6 m)   Wt 72.5 kg   SpO2 95%   BMI 28.31 kg/m   Intake/Output Summary (Last 24 hours) at 01/12/2023 1439 Last data filed at 01/12/2023 1228 Gross per 24 hour  Intake 700 ml  Output 1000 ml  Net -300 ml   Weight change:   Physical Exam: Gen:NAD CVS: RRR Resp: CTA bb/l Abd: ND Ext: nonpitting edema b/l Les Neuro: sleepy but arousable  Imaging: ECHOCARDIOGRAM COMPLETE  Result Date: 01/11/2023    ECHOCARDIOGRAM REPORT   Patient Name:   ELIENAI GAILEY Date of Exam: 01/11/2023 Medical Rec #:  [id number]     Height:       63.0 in Accession #:    [id number]    Weight:       155.0 lb Date of Birth:  1932-09-07     BSA:          1.735 m Patient Age:    89 years      BP:           139/52 mmHg Patient Gender: F             HR:           82 bpm. Exam Location:  Inpatient Procedure: 2D Echo, Cardiac Doppler and Color Doppler Indications:    Dyspnea R06.00  History:        Patient has no prior history of Echocardiogram examinations.                 Arrythmias:Atrial Fibrillation; Risk Factors:Hypertension,                 Diabetes and Dyslipidemia. CKD, stage 5.  Sonographer:    Lucendia Herrlich Referring Phys: [phone] HAO MENG IMPRESSIONS  1. Left ventricular ejection fraction, by estimation, is 60 to 65%. The left ventricle has normal function. The left ventricle has no regional wall motion abnormalities. Left ventricular diastolic parameters are indeterminate.  2. Right ventricular systolic function is mildly reduced. The right ventricular size is severely enlarged. There is severely elevated pulmonary artery systolic pressure.  3. Left atrial size was severely dilated.  4. Right atrial size was severely  dilated.  5. The mitral valve is normal in structure. Mild to moderate mitral valve regurgitation. No evidence of mitral stenosis.  6. Tricuspid valve regurgitation is moderate to severe.  7. The aortic valve is normal in structure. Aortic valve regurgitation is not visualized. Aortic valve sclerosis is present, with no evidence of aortic valve stenosis.  8. The inferior vena cava is dilated in size with <50% respiratory variability, suggesting right atrial pressure of 15 mmHg. FINDINGS  Left Ventricle: Left ventricular ejection fraction, by estimation, is 60 to 65%. The left ventricle has normal function. The left ventricle has no regional wall motion abnormalities. The left ventricular internal cavity size was normal in size. There is  no left ventricular hypertrophy. Left ventricular diastolic parameters are indeterminate. Right Ventricle: The right ventricular size is severely enlarged. No increase in right ventricular wall thickness. Right ventricular systolic function is mildly reduced. There is severely elevated pulmonary artery systolic pressure. The tricuspid regurgitant velocity is 3.60 m/s, and with an assumed right atrial pressure of 15 mmHg, the estimated right ventricular systolic pressure is 66.8 mmHg. Left Atrium: Left atrial size was severely dilated. Right Atrium: Right atrial size was severely dilated. Pericardium: There is no evidence of pericardial effusion. Mitral Valve: The mitral valve is normal in structure. Mild to moderate mitral valve regurgitation. No evidence of mitral valve stenosis. Tricuspid Valve: The tricuspid valve is normal in structure. Tricuspid valve regurgitation is moderate to severe. No evidence of tricuspid stenosis. Aortic Valve: The aortic valve is normal in structure. Aortic valve regurgitation is not visualized. Aortic valve sclerosis is present, with no evidence of aortic valve stenosis. Aortic valve mean gradient measures 7.0 mmHg. Aortic valve peak gradient measures  12.5 mmHg. Aortic valve area, by VTI measures 1.71 cm. Pulmonic Valve: The pulmonic valve was normal in structure. Pulmonic valve regurgitation is trivial. No evidence of pulmonic stenosis. Aorta: The aortic root is normal in size and structure. Venous: The inferior vena cava is dilated in size with less than 50% respiratory variability, suggesting right atrial pressure of 15 mmHg. IAS/Shunts: No atrial level shunt detected by color flow Doppler.  LEFT VENTRICLE PLAX 2D LVIDd:         4.40 cm   Diastology LVIDs:         2.50 cm   LV e' medial:    7.10 cm/s LV PW:         0.90 cm   LV E/e' medial:  14.1 LV IVS:        0.90 cm   LV e' lateral:   12.20 cm/s LVOT diam:     1.80 cm   LV E/e' lateral:  8.2 LV SV:         54 LV SV Index:   31 LVOT Area:     2.54 cm  RIGHT VENTRICLE             IVC RV S prime:     14.30 cm/s  IVC diam: 2.80 cm TAPSE (M-mode): 1.5 cm LEFT ATRIUM              Index        RIGHT ATRIUM           Index LA diam:        4.90 cm  2.82 cm/m   RA Area:     30.70 cm LA Vol (A2C):   99.0 ml  57.06 ml/m  RA Volume:   105.80 ml 60.98 ml/m LA Vol (A4C):   111.0 ml 63.97 ml/m LA Biplane Vol: 114.0 ml 65.70 ml/m  AORTIC VALVE AV Area (Vmax):    1.84 cm AV Area (Vmean):   1.74 cm AV Area (VTI):     1.71 cm AV Vmax:           177.00 cm/s AV Vmean:          116.000 cm/s AV VTI:            0.318 m AV Peak Grad:      12.5 mmHg AV Mean Grad:      7.0 mmHg LVOT Vmax:         127.80 cm/s LVOT Vmean:        79.120 cm/s LVOT VTI:          0.213 m LVOT/AV VTI ratio: 0.67  AORTA Ao Root diam: 2.90 cm Ao Asc diam:  2.90 cm MITRAL VALVE               TRICUSPID VALVE MV Area (PHT): 4.21 cm    TR Peak grad:   51.8 mmHg MV Decel Time: 180 msec    TR Vmax:        360.00 cm/s MR Peak grad: 63.4 mmHg MR Vmax:      398.00 cm/s  SHUNTS MV E velocity: 99.80 cm/s  Systemic VTI:  0.21 m MV A velocity: 39.30 cm/s  Systemic Diam: 1.80 cm MV E/A ratio:  2.54 Kardie Tobb DO Electronically signed by Thomasene Ripple DO Signature  Date/Time: 01/11/2023/8:09:24 PM    Final     Labs: BMET Recent Labs  Lab 01/10/23 1610 01/10/23 9604 01/10/23 1339 01/11/23 0046 01/12/23 0011 01/12/23 0659  NA 134* 137  --  134* 135 136  K 3.5 3.6  --  3.7 3.5 3.7  CL 96* 97*  --  97* 93* 94*  CO2 23  --   --  25 25 23   GLUCOSE 119* 118*  --  96 114* 110*  BUN 104* 110*  --  100* 101* 99*  CREATININE 3.89* 4.00*  --  3.89* 4.16* 4.09*  CALCIUM 9.2  --   --  8.9 9.4 9.6  PHOS  --   --  5.2* 5.4* 4.9*  --    CBC Recent Labs  Lab 01/10/23 0639 01/10/23 0651 01/11/23 0046 01/12/23 0011  WBC 4.7  --  5.2 6.5  NEUTROABS 2.2  --   --   --   HGB 9.9* 11.6* 9.1* 9.4*  HCT 30.7* 34.0* 28.2* 29.1*  MCV 96.2  --  94.9 92.1  PLT 183  --  177 174    Medications:     [  START ON 01/13/2023] apixaban  2.5 mg Oral BID   insulin aspart  0-5 Units Subcutaneous QHS   insulin aspart  0-6 Units Subcutaneous TID WC   isosorbide mononitrate  15 mg Oral QHS   metoprolol succinate  50 mg Oral QPM   pantoprazole  40 mg Oral Daily      Anthony Sar, MD [facility] 01/12/2023, 2:39 PM

## 2023-01-12 NOTE — Op Note (Signed)
01/12/2023  12:13 PM  PATIENT:  Tabitha Black  89 y.o. female  PRE-OPERATIVE DIAGNOSIS: Incarcerated incisional hernia 6cm  POST-OPERATIVE DIAGNOSIS: Incarcerated incisional hernia, 6 cm  PROCEDURE:  Procedure(s): XI ROBOTIC ASSISTED INCISIONAL  HERNIA REPAIR WITH MESH (N/A) TAPP  SURGEON:  Surgeon(s) and Role:    * Axel Filler, MD - Primary  ASSISTANTS: Berenda Morale, RNFA   ANESTHESIA:   local and general  EBL:  minimal   BLOOD ADMINISTERED:none  DRAINS: none   LOCAL MEDICATIONS USED:  BUPIVICAINE and Exparel  SPECIMEN:  No Specimen  DISPOSITION OF SPECIMEN:  N/A  COUNTS:  YES  TOURNIQUET:  * No tourniquets in log *  DICTATION: .Dragon Dictation  Findings: Patient had an incarcerated portion of the cecum as well as retroperitoneal fat in the right lower quadrant incisional hernia.  The hernia measured approximately 6 cm x 4 cm.  A piece of 12 x 16 cm ProGrip mesh was placed over the primarily repaired hernia.  Details of procedure: After the patient was consented patient was taken back to the OR and placed supine position with bilateral SCDs in place.  She underwent general endotracheal anesthesia.  She was then prepped and draped standard fashion.  A timeout was called all facts verified.  At this time a Veress needle technique was used insufflate the abdomen left upper quadrant.  Subsequent to this an 8 mm trocar Placed intra-abdominal.  At this time there was large amount of omental adhered to the previous hernia repair mesh in the midline.  A 8 mm trocars placed in the left lower quadrant direct visualization.  At this time I proceeded to bluntly and sharply take down the thin omental adhesions from the anterior abdominal wall.  I was able to visualize the hernia.  An 8 mm trocar was placed in the epigastrium under direct visualization.  At this time the robot cart was then docked to the patient.  At this time I was able to sharply dissect away the  peritoneal adhesions to the hernia site.  I then created a preperitoneal flap.  This allowed me to dissect into the preperitoneal plane and into the hernia.  The hernia had to be extended to help with reduction even with external pressure this was done reducing.  This appeared to be retroperitoneal fat as well as cecum.  A preperitoneal space was circumferentially dissected away from surrounding tissue.  At this time I was able to fully reduce the hernia and its contents.  At this time I measured the hernia this is approximate 6x4 cm.  A 2 oh V-Loc suture was then ran and used to close the hernia primarily.  The area was measured.  There is approximately 16 x 12 cm.  A 16 x 12 cm ProGrip laparoscopic mesh was then placed into this area to cover the hernia repair site.  This lay flat.  There is no sutures or tacks placed.  At this time the peritoneal flap was then reapproximated using a 2 oh V-Loc x 1.  There was a small tear in the inferior/posterior to the peritoneal flap.  At this time this was reapproximated and packed using a 2 OV lock and the patient retroperitoneal fat that was excised from the hernia sac itself.  At this time the insufflation was evacuated.  All trocars were removed.  Trocar sites were then reapproximated with 4-0 Monocryl subcuticular fashion.  The skin was dressed Dermabond.  Patient tolerated the procedure well was taken to the recovery  in stable condition.  PLAN OF CARE: Admit for overnight observation  PATIENT DISPOSITION:  PACU - hemodynamically stable.   Delay start of Pharmacological VTE agent (>24hrs) due to surgical blood loss or risk of bleeding: no

## 2023-01-12 NOTE — Anesthesia Postprocedure Evaluation (Signed)
Anesthesia Post Note  Patient: Tabitha Black  Procedure(s) Performed: XI ROBOTIC ASSISTED INCISIONAL  HERNIA REPAIR WITH MESH (Abdomen)     Patient location during evaluation: PACU Anesthesia Type: General Level of consciousness: patient cooperative, sedated and oriented Pain management: pain level controlled Vital Signs Assessment: post-procedure vital signs reviewed and stable Respiratory status: spontaneous breathing, nonlabored ventilation and respiratory function stable Cardiovascular status: blood pressure returned to baseline and stable Postop Assessment: no apparent nausea or vomiting Anesthetic complications: no   No notable events documented.  Last Vitals:  Vitals:   01/12/23 1315 01/12/23 1336  BP: (!) 129/48 (!) 132/55  Pulse: 82 82  Resp: 17 17  Temp: 36.8 C 36.8 C  SpO2: 93% 95%    Last Pain:  Vitals:   01/12/23 1336  TempSrc: Oral  PainSc:                  Tabitha Black,Tabitha Black

## 2023-01-12 NOTE — Hospital Course (Addendum)
The patient is a 89 year old Caucasian female with a past medical history significant for but limited to diastolic congestive heart failure, atrial fibrillation on anticoagulation with Eliquis, late stage CKD stage IV early 5, GYN, hyperlipidemia, history of multiple abdominal surgeries as well as right abdominal hernia and bilateral lower extremity lymphedema who presented with intermittent abdominal pain for about a week that became persistent on the day of presentation.  A right-sided hernia is not reducible and CT of the abdomen pelvis was done and revealed the right sided Spigelian manage contained a portion of the terminal ileum, cecum and proximal ascending colon without bowel obstruction.  She was noted to be hemodynamically stable and creatinine was around her baseline with an elevated BUN of 110.  General surgery was consulted in the plan for surgical intervention on 01/12/2023 and cardiology was consulted for cardiac clearance as well as nephrology being consulted for AKI on CKD and azotemia.  She is now out of the operating room and having some abdominal discomfort.  Her left foot remains erythematous and warm and she is being treated for outpatient cellulitis which will continue antibiotics now to complete 3 more days. She is improving and PT/OT recommending SNF.   Will resume her anticoagulation with apixaban on 01/14/2023.  The surgery team feels that she is stable for discharge as well as nephrology.  She is medically stable to be discharged to SNF for continued strength training and nephrology recommending resuming diuretics on Monday with 60 mg of Lasix p.o. twice daily.  Assessment and Plan:  Abdominal pain due to irreducible spigelian hernia post robotic assisted right lower quadrant incisional hernia repair with mesh postoperative day 2 by Dr. Derrell Lolling -Patient with known history of abdominal hernia.  -Previously reducible.  Presents with significant pain.  No evidence of obstruction.   Passing flatus.  No bowel movement since admission.  No nausea or vomiting. -General surgery following-plan for laparoscopic assisted incisional hernia repair on 6/26. -Cardiology has been consulted for cardiac clearance  -N.p.o. after midnight further diet advancement per general surgery now and then advance her to a regular diet -PT/OT recommending SNF -General surgery recommending multimodal pain control feel that she is surgically stable but needs mobilization assessment prior to discharge. Pain scripts written by Nephrology  -Surgery feels that we can resume her anticoagulation with Eliquis on 01/14/2023   Chronic Diastolic CHF -Appears euvolemic except for chronic BLE lymphedema.  TTE in 2018 with LVEF of 60 to 65%, no RWMA but G2 DD.  On Lasix and metolazone at home but now Nephrology recommending just Furosemide at D/C -Converted to IV Lasix on admission and she was continued IV Lasix 80 mg twice daily but held by Nephrology recommending to continue to hold and recommending spot diuresis preferred and no need for Lasix today and now the Nephrology Team recommending starting po Lasix at 60 mg BID on Monday  -Cardiology consulted for preoperative clearance and she had an increased moderate risk -Strict intake and output, daily weights, renal functions and electrolytes No intake or output data in the 24 hours ending 01/14/23 1339 -She has not had any heart failure overtly postoperatively and cardiology feels that she is okay and recommending following up in outpatient setting with her regular scheduled appointment on 02/23/2023 with Tereso Newcomer recommending reconsulting if necessary  Bilateral Lower Extremity Lymphedema with concern for Right Foot Cellulitis, improving  -Resume Abx for Right Foot Cellulitis and will continue for 3 mor days -Diuretics as above being held for now but  per Nephrology to resume on Monday at 60 mg po BID   CKD-V/Azotemia Elevated Anion Gap -Not uremic.  Followed by  Dr. Allena Katz. Recent Labs  Lab 01/10/23 0639 01/10/23 0651 01/11/23 0046 01/12/23 0011 01/12/23 0659 01/13/23 0440 01/14/23 0312  BUN 104* 110* 100* 101* 99* 94* 86*  CREATININE 3.89* 4.00* 3.89* 4.16* 4.09* 3.62* 3.41*  -Nephrology consulted and will see patient -Monitor daily   Bone Mineral Disorder -Has mild Hyperphosphatemia. -May need Phos binder but defer to Nephrology   Anemia of Chronic Disease -Hgb/Hct Trend: Recent Labs  Lab 01/04/23 1346 01/10/23 0639 01/10/23 0651 01/11/23 0046 01/12/23 0011 01/13/23 0440 01/14/23 0312  HGB 10.0* 9.9* 11.6* 9.1* 9.4* 9.0* 9.1*  HCT 31.8* 30.7* 34.0* 28.2* 29.1* 28.2* 28.7*  MCV 93.8 96.2  --  94.9 92.1 94.9 95.0  -Check anemia panel in the morning -ESA per nephrology if deemed appropriate -Continue to monitor for S/Sx of Bleeding; No overt bleeding noted -Repeat CBC within 1 week   Persistent atrial fibrillation -On Eliquis at home.  Rate controlled. -Continue metoprolol -IV heparin for anticoagulation.  Eliquis on hold for surgery and per surgery it is resume on 01/14/2023 -Optimize electrolytes   Hypertension -Normotensive for most part -Continue current regimen -Continue to Monitor BP per Protocol -Last BP reading was 146/66   Hypomagnesemia -Patient's Mag Level Trend: Recent Labs  Lab 01/10/23 1339 01/11/23 0046 01/12/23 0011 01/13/23 0440 01/14/23 0312  MG 1.4* 1.4* 1.7 1.8 1.7  -Continue to Monitor and Replete as Necessary -Repeat Mag in the AM   Generalized weakness/Physical Deconditioning -PT/OT to further evaluate and Treat and recommending SNF  Hyperbilirubinemia -T Bili Trend: Recent Labs  Lab 01/04/23 1346 01/10/23 0639 01/12/23 0659 01/13/23 0440 01/14/23 0312  BILITOT 1.1 0.8 1.4* 0.6 0.8  -Continue to Monitor and Trend and repeat CMP in the AM  Hypoalbuminemia -Patient's Albumin Trend: Recent Labs  Lab 01/04/23 1346 01/10/23 0639 01/11/23 0046 01/12/23 0011 01/12/23 0659  01/13/23 0440 01/14/23 0312  ALBUMIN 4.2 3.5 2.9* 3.0* 3.1* 2.6* 2.7*  -Continue to Monitor and Trend and repeat CMP in the AM

## 2023-01-12 NOTE — Progress Notes (Signed)
Report called to short stay 5205. Nurse leanne verbalized understanding of report and had no further questions. Pt transported via bed by transportation staff.

## 2023-01-12 NOTE — Progress Notes (Signed)
OT Cancellation Note  Patient Details Name: Tabitha Black MRN: [id number] DOB: [birth year]   Cancelled Treatment:    Reason Eval/Treat Not Completed: Patient at procedure or test/ unavailable (Pt off unit in OR for hernia repair. Will continue to follow patient and evaluate for OT when able to participate.)  Limmie Patricia, OTR/L,CBIS  Supplemental OT - [facility] and [facility] Secure Chat Preferred   01/12/2023, 10:04 AM

## 2023-01-12 NOTE — Progress Notes (Signed)
Nurse made aware that heparin was cut off at 0500

## 2023-01-12 NOTE — Progress Notes (Signed)
Day of Surgery   Subjective/Chief Complaint: Pt with some abd pain   Objective: Vital signs in last 24 hours: Temp:  [97.5 F (36.4 C)-98.6 F (37 C)] 97.9 F (36.6 C) (06/26 0904) Pulse Rate:  [82-89] 84 (06/26 0904) Resp:  [16-18] 18 (06/26 0904) BP: (134-159)/(54-76) 159/66 (06/26 0904) SpO2:  [95 %-97 %] 95 % (06/26 0904) Weight:  [72.5 kg] 72.5 kg (06/26 0904)    Intake/Output from previous day: 06/25 0701 - 06/26 0700 In: -  Out: 1000 [Urine:1000] Intake/Output this shift: No intake/output data recorded.  PE:  Constitutional: No acute distress, conversant, appears states age. Eyes: Anicteric sclerae, moist conjunctiva, no lid lag Lungs: Clear to auscultation bilaterally, normal respiratory effort CV: regular rate and rhythm, no murmurs, no peripheral edema, pedal pulses 2+ GI: Soft, no masses or hepatosplenomegaly, tender to palpation a t hernia Skin: No rashes, palpation reveals normal turgor Psychiatric: appropriate judgment and insight, oriented to person, place, and time   Lab Results:  Recent Labs    01/11/23 0046 01/12/23 0011  WBC 5.2 6.5  HGB 9.1* 9.4*  HCT 28.2* 29.1*  PLT 177 174   BMET Recent Labs    01/12/23 0011 01/12/23 0659  NA 135 136  K 3.5 3.7  CL 93* 94*  CO2 25 23  GLUCOSE 114* 110*  BUN 101* 99*  CREATININE 4.16* 4.09*  CALCIUM 9.4 9.6   PT/INR No results for input(s): "LABPROT", "INR" in the last 72 hours. ABG No results for input(s): "PHART", "HCO3" in the last 72 hours.  Invalid input(s): "PCO2", "PO2"  Studies/Results: ECHOCARDIOGRAM COMPLETE  Result Date: 01/11/2023    ECHOCARDIOGRAM REPORT   Patient Name:   Tabitha Black Date of Exam: 01/11/2023 Medical Rec #:  [id number]     Height:       63.0 in Accession #:    [id number]    Weight:       155.0 lb Date of Birth:  01-Nov-1932     BSA:          1.735 m Patient Age:    89 years      BP:           139/52 mmHg Patient Gender: F             HR:           82 bpm. Exam  Location:  Inpatient Procedure: 2D Echo, Cardiac Doppler and Color Doppler Indications:    Dyspnea R06.00  History:        Patient has no prior history of Echocardiogram examinations.                 Arrythmias:Atrial Fibrillation; Risk Factors:Hypertension,                 Diabetes and Dyslipidemia. CKD, stage 5.  Sonographer:    Lucendia Herrlich Referring Phys: [phone] HAO MENG IMPRESSIONS  1. Left ventricular ejection fraction, by estimation, is 60 to 65%. The left ventricle has normal function. The left ventricle has no regional wall motion abnormalities. Left ventricular diastolic parameters are indeterminate.  2. Right ventricular systolic function is mildly reduced. The right ventricular size is severely enlarged. There is severely elevated pulmonary artery systolic pressure.  3. Left atrial size was severely dilated.  4. Right atrial size was severely dilated.  5. The mitral valve is normal in structure. Mild to moderate mitral valve regurgitation. No evidence of mitral stenosis.  6. Tricuspid valve regurgitation is moderate to severe.  7. The aortic valve is normal in structure. Aortic valve regurgitation is not visualized. Aortic valve sclerosis is present, with no evidence of aortic valve stenosis.  8. The inferior vena cava is dilated in size with <50% respiratory variability, suggesting right atrial pressure of 15 mmHg. FINDINGS  Left Ventricle: Left ventricular ejection fraction, by estimation, is 60 to 65%. The left ventricle has normal function. The left ventricle has no regional wall motion abnormalities. The left ventricular internal cavity size was normal in size. There is  no left ventricular hypertrophy. Left ventricular diastolic parameters are indeterminate. Right Ventricle: The right ventricular size is severely enlarged. No increase in right ventricular wall thickness. Right ventricular systolic function is mildly reduced. There is severely elevated pulmonary artery systolic pressure. The  tricuspid regurgitant velocity is 3.60 m/s, and with an assumed right atrial pressure of 15 mmHg, the estimated right ventricular systolic pressure is 66.8 mmHg. Left Atrium: Left atrial size was severely dilated. Right Atrium: Right atrial size was severely dilated. Pericardium: There is no evidence of pericardial effusion. Mitral Valve: The mitral valve is normal in structure. Mild to moderate mitral valve regurgitation. No evidence of mitral valve stenosis. Tricuspid Valve: The tricuspid valve is normal in structure. Tricuspid valve regurgitation is moderate to severe. No evidence of tricuspid stenosis. Aortic Valve: The aortic valve is normal in structure. Aortic valve regurgitation is not visualized. Aortic valve sclerosis is present, with no evidence of aortic valve stenosis. Aortic valve mean gradient measures 7.0 mmHg. Aortic valve peak gradient measures 12.5 mmHg. Aortic valve area, by VTI measures 1.71 cm. Pulmonic Valve: The pulmonic valve was normal in structure. Pulmonic valve regurgitation is trivial. No evidence of pulmonic stenosis. Aorta: The aortic root is normal in size and structure. Venous: The inferior vena cava is dilated in size with less than 50% respiratory variability, suggesting right atrial pressure of 15 mmHg. IAS/Shunts: No atrial level shunt detected by color flow Doppler.  LEFT VENTRICLE PLAX 2D LVIDd:         4.40 cm   Diastology LVIDs:         2.50 cm   LV e' medial:    7.10 cm/s LV PW:         0.90 cm   LV E/e' medial:  14.1 LV IVS:        0.90 cm   LV e' lateral:   12.20 cm/s LVOT diam:     1.80 cm   LV E/e' lateral: 8.2 LV SV:         54 LV SV Index:   31 LVOT Area:     2.54 cm  RIGHT VENTRICLE             IVC RV S prime:     14.30 cm/s  IVC diam: 2.80 cm TAPSE (M-mode): 1.5 cm LEFT ATRIUM              Index        RIGHT ATRIUM           Index LA diam:        4.90 cm  2.82 cm/m   RA Area:     30.70 cm LA Vol (A2C):   99.0 ml  57.06 ml/m  RA Volume:   105.80 ml 60.98 ml/m  LA Vol (A4C):   111.0 ml 63.97 ml/m LA Biplane Vol: 114.0 ml 65.70 ml/m  AORTIC VALVE AV Area (Vmax):    1.84 cm AV Area (Vmean):   1.74 cm AV  Area (VTI):     1.71 cm AV Vmax:           177.00 cm/s AV Vmean:          116.000 cm/s AV VTI:            0.318 m AV Peak Grad:      12.5 mmHg AV Mean Grad:      7.0 mmHg LVOT Vmax:         127.80 cm/s LVOT Vmean:        79.120 cm/s LVOT VTI:          0.213 m LVOT/AV VTI ratio: 0.67  AORTA Ao Root diam: 2.90 cm Ao Asc diam:  2.90 cm MITRAL VALVE               TRICUSPID VALVE MV Area (PHT): 4.21 cm    TR Peak grad:   51.8 mmHg MV Decel Time: 180 msec    TR Vmax:        360.00 cm/s MR Peak grad: 63.4 mmHg MR Vmax:      398.00 cm/s  SHUNTS MV E velocity: 99.80 cm/s  Systemic VTI:  0.21 m MV A velocity: 39.30 cm/s  Systemic Diam: 1.80 cm MV E/A ratio:  2.54 Kardie Tobb DO Electronically signed by Thomasene Ripple DO Signature Date/Time: 01/11/2023/8:09:24 PM    Final     Anti-infectives: Anti-infectives (From admission, onward)    Start     Dose/Rate Route Frequency Ordered Stop   01/12/23 0600  [MAR Hold]  cefTRIAXone (ROCEPHIN) 2 g in sodium chloride 0.9 % 100 mL IVPB        (MAR Hold since Wed 01/12/2023 at 0901.Hold Reason: Transfer to a Procedural area)   2 g 200 mL/hr over 30 Minutes Intravenous On call to O.R. 01/11/23 1045 01/13/23 0559   01/10/23 0945  cefTRIAXone (ROCEPHIN) 1 g in sodium chloride 0.9 % 100 mL IVPB        1 g 200 mL/hr over 30 Minutes Intravenous  Once 01/10/23 1610 01/10/23 1114       Assessment/Plan: RLQ incisional hernia  -to or today for robo incisional hernia repair with mesh. All risks and benefits were discussed with the patient, to generally include infection, bleeding, damage to surrounding structures, acute and chronic nerve pain, and recurrence. Alternatives were offered and described.  All questions were answered and the patient voiced understanding of the procedure and wishes to proceed at this point.   CKD chronic  lymphedema atrial fibrillation type II DM GERD Hyperlipidemia HTN OA   LOS: 2 days    Axel Filler 01/12/2023

## 2023-01-13 ENCOUNTER — Encounter (HOSPITAL_COMMUNITY): Payer: Self-pay | Admitting: General Surgery

## 2023-01-13 DIAGNOSIS — N185 Chronic kidney disease, stage 5: Secondary | ICD-10-CM | POA: Diagnosis not present

## 2023-01-13 DIAGNOSIS — I5032 Chronic diastolic (congestive) heart failure: Secondary | ICD-10-CM | POA: Diagnosis not present

## 2023-01-13 DIAGNOSIS — I10 Essential (primary) hypertension: Secondary | ICD-10-CM | POA: Diagnosis not present

## 2023-01-13 DIAGNOSIS — K436 Other and unspecified ventral hernia with obstruction, without gangrene: Secondary | ICD-10-CM | POA: Diagnosis not present

## 2023-01-13 LAB — COMPREHENSIVE METABOLIC PANEL
ALT: 11 U/L (ref 0–44)
AST: 22 U/L (ref 15–41)
Albumin: 2.6 g/dL — ABNORMAL LOW (ref 3.5–5.0)
Alkaline Phosphatase: 24 U/L — ABNORMAL LOW (ref 38–126)
Anion gap: 12 (ref 5–15)
BUN: 94 mg/dL — ABNORMAL HIGH (ref 8–23)
CO2: 26 mmol/L (ref 22–32)
Calcium: 9.1 mg/dL (ref 8.9–10.3)
Chloride: 99 mmol/L (ref 98–111)
Creatinine, Ser: 3.62 mg/dL — ABNORMAL HIGH (ref 0.44–1.00)
GFR, Estimated: 12 mL/min — ABNORMAL LOW (ref >60)
Glucose, Bld: 126 mg/dL — ABNORMAL HIGH (ref 70–99)
Potassium: 3.5 mmol/L (ref 3.5–5.1)
Sodium: 137 mmol/L (ref 135–145)
Total Bilirubin: 0.6 mg/dL (ref 0.3–1.2)
Total Protein: 5.8 g/dL — ABNORMAL LOW (ref 6.5–8.1)

## 2023-01-13 LAB — GLUCOSE, CAPILLARY
Glucose-Capillary: 136 mg/dL — ABNORMAL HIGH (ref 70–99)
Glucose-Capillary: 177 mg/dL — ABNORMAL HIGH (ref 70–99)
Glucose-Capillary: 124 mg/dL — ABNORMAL HIGH (ref 70–99)

## 2023-01-13 LAB — CBC WITH DIFFERENTIAL/PLATELET
Abs Immature Granulocytes: 0.05 10*3/uL (ref 0.00–0.07)
Basophils Absolute: 0 10*3/uL (ref 0.0–0.1)
Basophils Relative: 0 %
Eosinophils Absolute: 0 10*3/uL (ref 0.0–0.5)
Eosinophils Relative: 1 %
HCT: 28.2 % — ABNORMAL LOW (ref 36.0–46.0)
Hemoglobin: 9 g/dL — ABNORMAL LOW (ref 12.0–15.0)
Immature Granulocytes: 1 %
Lymphocytes Relative: 20 %
Lymphs Abs: 0.9 10*3/uL (ref 0.7–4.0)
MCH: 30.3 pg (ref 26.0–34.0)
MCHC: 31.9 g/dL (ref 30.0–36.0)
MCV: 94.9 fL (ref 80.0–100.0)
Monocytes Absolute: 0.4 10*3/uL (ref 0.1–1.0)
Monocytes Relative: 9 %
Neutro Abs: 3.1 10*3/uL (ref 1.7–7.7)
Neutrophils Relative %: 69 %
Platelets: 161 10*3/uL (ref 150–400)
RBC: 2.97 MIL/uL — ABNORMAL LOW (ref 3.87–5.11)
RDW: 15 % (ref 11.5–15.5)
WBC: 4.5 10*3/uL (ref 4.0–10.5)
nRBC: 0 % (ref 0.0–0.2)

## 2023-01-13 LAB — MAGNESIUM: Magnesium: 1.8 mg/dL (ref 1.7–2.4)

## 2023-01-13 LAB — PHOSPHORUS: Phosphorus: 6.2 mg/dL — ABNORMAL HIGH (ref 2.5–4.6)

## 2023-01-13 MED ORDER — APIXABAN 2.5 MG PO TABS
2.5000 mg | ORAL_TABLET | Freq: Two times a day (BID) | ORAL | Status: DC
  Administered 2023-01-14: 2.5 mg via ORAL
  Filled 2023-01-13: qty 1

## 2023-01-13 MED ORDER — POTASSIUM CHLORIDE CRYS ER 20 MEQ PO TBCR
40.0000 meq | EXTENDED_RELEASE_TABLET | Freq: Once | ORAL | Status: AC
  Administered 2023-01-13: 40 meq via ORAL
  Filled 2023-01-13: qty 2

## 2023-01-13 MED ORDER — METHOCARBAMOL 500 MG PO TABS: 500.0000 mg | ORAL_TABLET | Freq: Three times a day (TID) | ORAL | Status: DC | PRN

## 2023-01-13 MED ORDER — ENOXAPARIN SODIUM 40 MG/0.4ML IJ SOSY: 40.0000 mg | PREFILLED_SYRINGE | INTRAMUSCULAR | Status: DC

## 2023-01-13 MED ORDER — ACETAMINOPHEN 500 MG PO TABS
1000.0000 mg | ORAL_TABLET | Freq: Four times a day (QID) | ORAL | Status: DC
  Administered 2023-01-13 – 2023-01-14 (×4): 1000 mg via ORAL
  Filled 2023-01-13 (×4): qty 2

## 2023-01-13 NOTE — Progress Notes (Signed)
1 Day Post-Op  Subjective: Some pain and soreness as expected.  No nausea.  Hungry.  Tolerating liquids.  Hasn't been out of bed yet.  Has purewick in place.  Objective: Vital signs in last 24 hours: Temp:  [97.7 F (36.5 C)-98.4 F (36.9 C)] 98.4 F (36.9 C) (06/27 0829) Pulse Rate:  [66-95] 66 (06/27 0829) Resp:  [12-18] 12 (06/27 0829) BP: (114-159)/(48-79) 137/62 (06/27 0829) SpO2:  [93 %-100 %] 99 % (06/27 0829) Weight:  [72.5 kg] 72.5 kg (06/26 0904)    Intake/Output from previous day: 06/26 0701 - 06/27 0700 In: 772.1 [I.V.:672.1; IV Piggyback:100] Out: 250 [Urine:250] Intake/Output this shift: No intake/output data recorded.  PE: Gen: NAD Abd: soft,appropriately tender, +BS, ND, incisions c/d/I GU: purewick in place  Lab Results:  Recent Labs    01/12/23 0011 01/13/23 0440  WBC 6.5 4.5  HGB 9.4* 9.0*  HCT 29.1* 28.2*  PLT 174 161   BMET Recent Labs    01/12/23 0659 01/13/23 0440  NA 136 137  K 3.7 3.5  CL 94* 99  CO2 23 26  GLUCOSE 110* 126*  BUN 99* 94*  CREATININE 4.09* 3.62*  CALCIUM 9.6 9.1   PT/INR No results for input(s): "LABPROT", "INR" in the last 72 hours. CMP     Component Value Date/Time   NA 137 01/13/2023 0440   NA 141 06/08/2022 1619   K 3.5 01/13/2023 0440   CL 99 01/13/2023 0440   CO2 26 01/13/2023 0440   GLUCOSE 126 (H) 01/13/2023 0440   BUN 94 (H) 01/13/2023 0440   BUN 44 (H) 06/08/2022 1619   CREATININE 3.62 (H) 01/13/2023 0440   CALCIUM 9.1 01/13/2023 0440   PROT 5.8 (L) 01/13/2023 0440   ALBUMIN 2.6 (L) 01/13/2023 0440   AST 22 01/13/2023 0440   ALT 11 01/13/2023 0440   ALKPHOS 24 (L) 01/13/2023 0440   BILITOT 0.6 01/13/2023 0440   GFRNONAA 12 (L) 01/13/2023 0440   GFRAA 22 (L) 07/25/2020 1126   Lipase     Component Value Date/Time   LIPASE 44 01/10/2023 0639       Studies/Results: ECHOCARDIOGRAM COMPLETE  Result Date: 01/11/2023    ECHOCARDIOGRAM REPORT   Patient Name:   Tabitha Black Date of  Exam: 01/11/2023 Medical Rec #:  [id number]     Height:       63.0 in Accession #:    [id number]    Weight:       155.0 lb Date of Birth:  10/05/1932     BSA:          1.735 m Patient Age:    89 years      BP:           139/52 mmHg Patient Gender: F             HR:           82 bpm. Exam Location:  Inpatient Procedure: 2D Echo, Cardiac Doppler and Color Doppler Indications:    Dyspnea R06.00  History:        Patient has no prior history of Echocardiogram examinations.                 Arrythmias:Atrial Fibrillation; Risk Factors:Hypertension,                 Diabetes and Dyslipidemia. CKD, stage 5.  Sonographer:    Lucendia Herrlich Referring Phys: [phone] HAO MENG IMPRESSIONS  1. Left ventricular ejection fraction, by  estimation, is 60 to 65%. The left ventricle has normal function. The left ventricle has no regional wall motion abnormalities. Left ventricular diastolic parameters are indeterminate.  2. Right ventricular systolic function is mildly reduced. The right ventricular size is severely enlarged. There is severely elevated pulmonary artery systolic pressure.  3. Left atrial size was severely dilated.  4. Right atrial size was severely dilated.  5. The mitral valve is normal in structure. Mild to moderate mitral valve regurgitation. No evidence of mitral stenosis.  6. Tricuspid valve regurgitation is moderate to severe.  7. The aortic valve is normal in structure. Aortic valve regurgitation is not visualized. Aortic valve sclerosis is present, with no evidence of aortic valve stenosis.  8. The inferior vena cava is dilated in size with <50% respiratory variability, suggesting right atrial pressure of 15 mmHg. FINDINGS  Left Ventricle: Left ventricular ejection fraction, by estimation, is 60 to 65%. The left ventricle has normal function. The left ventricle has no regional wall motion abnormalities. The left ventricular internal cavity size was normal in size. There is  no left ventricular hypertrophy. Left  ventricular diastolic parameters are indeterminate. Right Ventricle: The right ventricular size is severely enlarged. No increase in right ventricular wall thickness. Right ventricular systolic function is mildly reduced. There is severely elevated pulmonary artery systolic pressure. The tricuspid regurgitant velocity is 3.60 m/s, and with an assumed right atrial pressure of 15 mmHg, the estimated right ventricular systolic pressure is 66.8 mmHg. Left Atrium: Left atrial size was severely dilated. Right Atrium: Right atrial size was severely dilated. Pericardium: There is no evidence of pericardial effusion. Mitral Valve: The mitral valve is normal in structure. Mild to moderate mitral valve regurgitation. No evidence of mitral valve stenosis. Tricuspid Valve: The tricuspid valve is normal in structure. Tricuspid valve regurgitation is moderate to severe. No evidence of tricuspid stenosis. Aortic Valve: The aortic valve is normal in structure. Aortic valve regurgitation is not visualized. Aortic valve sclerosis is present, with no evidence of aortic valve stenosis. Aortic valve mean gradient measures 7.0 mmHg. Aortic valve peak gradient measures 12.5 mmHg. Aortic valve area, by VTI measures 1.71 cm. Pulmonic Valve: The pulmonic valve was normal in structure. Pulmonic valve regurgitation is trivial. No evidence of pulmonic stenosis. Aorta: The aortic root is normal in size and structure. Venous: The inferior vena cava is dilated in size with less than 50% respiratory variability, suggesting right atrial pressure of 15 mmHg. IAS/Shunts: No atrial level shunt detected by color flow Doppler.  LEFT VENTRICLE PLAX 2D LVIDd:         4.40 cm   Diastology LVIDs:         2.50 cm   LV e' medial:    7.10 cm/s LV PW:         0.90 cm   LV E/e' medial:  14.1 LV IVS:        0.90 cm   LV e' lateral:   12.20 cm/s LVOT diam:     1.80 cm   LV E/e' lateral: 8.2 LV SV:         54 LV SV Index:   31 LVOT Area:     2.54 cm  RIGHT  VENTRICLE             IVC RV S prime:     14.30 cm/s  IVC diam: 2.80 cm TAPSE (M-mode): 1.5 cm LEFT ATRIUM              Index  RIGHT ATRIUM           Index LA diam:        4.90 cm  2.82 cm/m   RA Area:     30.70 cm LA Vol (A2C):   99.0 ml  57.06 ml/m  RA Volume:   105.80 ml 60.98 ml/m LA Vol (A4C):   111.0 ml 63.97 ml/m LA Biplane Vol: 114.0 ml 65.70 ml/m  AORTIC VALVE AV Area (Vmax):    1.84 cm AV Area (Vmean):   1.74 cm AV Area (VTI):     1.71 cm AV Vmax:           177.00 cm/s AV Vmean:          116.000 cm/s AV VTI:            0.318 m AV Peak Grad:      12.5 mmHg AV Mean Grad:      7.0 mmHg LVOT Vmax:         127.80 cm/s LVOT Vmean:        79.120 cm/s LVOT VTI:          0.213 m LVOT/AV VTI ratio: 0.67  AORTA Ao Root diam: 2.90 cm Ao Asc diam:  2.90 cm MITRAL VALVE               TRICUSPID VALVE MV Area (PHT): 4.21 cm    TR Peak grad:   51.8 mmHg MV Decel Time: 180 msec    TR Vmax:        360.00 cm/s MR Peak grad: 63.4 mmHg MR Vmax:      398.00 cm/s  SHUNTS MV E velocity: 99.80 cm/s  Systemic VTI:  0.21 m MV A velocity: 39.30 cm/s  Systemic Diam: 1.80 cm MV E/A ratio:  2.54 Kardie Tobb DO Electronically signed by Thomasene Ripple DO Signature Date/Time: 01/11/2023/8:09:24 PM    Final     Anti-infectives: Anti-infectives (From admission, onward)    Start     Dose/Rate Route Frequency Ordered Stop   01/12/23 2200  cephALEXin (KEFLEX) capsule 500 mg        500 mg Oral 2 times daily 01/12/23 1800     01/12/23 1019  sodium chloride 0.9 % with cefTRIAXone (ROCEPHIN) ADS Med       Note to Pharmacy: Susy [place] L: cabinet override      01/12/23 1019 01/12/23 1047   01/12/23 0600  cefTRIAXone (ROCEPHIN) 2 g in sodium chloride 0.9 % 100 mL IVPB        2 g 200 mL/hr over 30 Minutes Intravenous On call to O.R. 01/11/23 1045 01/12/23 1042   01/10/23 0945  cefTRIAXone (ROCEPHIN) 1 g in sodium chloride 0.9 % 100 mL IVPB        1 g 200 mL/hr over 30 Minutes Intravenous  Once 01/10/23 0938 01/10/23 1114         Assessment/Plan POD 1, s/p robotic assisted RLQ incisional hernia repair with mesh, Dr. Derrell Lolling 6/26 -may resume Eliquis tomorrow 6/28 -regular diet -mobilize with therapies for their dispo recs -multi-modal pain control -surgically stable, but needs mobilization assessment prior to being able to dc  FEN - regular diet/IVFs VTE - eliquis  ID - Rocephin on call to OR, Keflex for LE cellulitis  CKD chronic lymphedema atrial fibrillation type II DM GERD Hyperlipidemia HTN OA    LOS: 3 days    Tabitha Black , [facility] 01/13/2023, 8:35 AM Please see Amion for pager number  during day hours 7:00am-4:30pm or 7:00am -11:30am on weekends

## 2023-01-13 NOTE — Plan of Care (Signed)
  Problem: Education: Goal: Knowledge of disease or condition will improve Outcome: Progressing   Problem: Education: Goal: Knowledge of General Education information will improve Description: Including pain rating scale, medication(s)/side effects and non-pharmacologic comfort measures Outcome: Progressing   Problem: Activity: Goal: Risk for activity intolerance will decrease Outcome: Progressing   Problem: Pain Managment: Goal: General experience of comfort will improve Outcome: Progressing   Problem: Safety: Goal: Ability to remain free from injury will improve Outcome: Progressing

## 2023-01-13 NOTE — NC FL2 (Signed)
[place] MEDICAID FL2 LEVEL OF CARE FORM     IDENTIFICATION  Patient Name: Tabitha Black Birthdate: [birth year] Sex: female Admission Date (Current Location): 01/10/2023  [facility] and [place] Number:  Producer, television/film/video and Address:  [facility], [address], [place], [place] [address]      Provider Number: [id number]  Attending Physician Name and Address:  Merlene Laughter, DO  Relative Name and Phone Number:  [name] (Daughter)  [phone] [facility] Phone)    Current Level of Care: Hospital Recommended Level of Care: Skilled Nursing Facility Prior Approval Number:    Date Approved/Denied:   PASRR Number: [id number]  Discharge Plan: SNF    Current Diagnoses: Patient Active Problem List   Diagnosis Date Noted   Irreducible Spigelian hernia 01/11/2023   Abdominal pain 01/10/2023   GI bleeding 11/12/2022   Cellulitis of right leg 11/12/2022   Lymphedema of both lower extremities - R > L 10/04/2022   HTN (hypertension) 08/23/2022   Persistent atrial fibrillation (HCC) 08/23/2022   DM2 (diabetes mellitus, type 2) (HCC) 08/23/2022   Pain in joint of left knee 01/06/2021   Adverse effect of antihyperlipidemic and antiarteriosclerotic drugs, initial encounter 08/22/2020   Age-related physical debility 08/22/2020   (HFpEF) heart failure with preserved ejection fraction (HCC) 08/22/2020   Chronic kidney disease (CKD), active medical management without dialysis, stage 5 (HCC) 08/22/2020   Diabetic renal disease (HCC) 08/22/2020   Gastroesophageal reflux disease 08/22/2020   History of malignant neoplasm of skin 08/22/2020   Iron deficiency anemia 08/22/2020   Mixed hyperlipidemia 08/22/2020   Osteoarthritis 08/22/2020   Personal history of colonic polyps 08/22/2020   Skin ulcer (HCC) 08/22/2020   Cervical spondylosis with radiculopathy 07/26/2017    Orientation RESPIRATION BLADDER Height & Weight     Self, Time, Situation, Place   Normal Continent Weight: 159 lb 13.3 oz (72.5 kg) Height:  5\' 3"  (160 cm)  BEHAVIORAL SYMPTOMS/MOOD NEUROLOGICAL BOWEL NUTRITION STATUS      Incontinent Diet (see d/c summary)  AMBULATORY STATUS COMMUNICATION OF NEEDS Skin   Extensive Assist Verbally Surgical wounds (Incisions abdomen;)                       Personal Care Assistance Level of Assistance  Bathing, Feeding, Dressing Bathing Assistance: Limited assistance Feeding assistance: Independent Dressing Assistance: Limited assistance     Functional Limitations Info  Sight, Hearing, Speech Sight Info: Adequate Hearing Info: Adequate Speech Info: Adequate    SPECIAL CARE FACTORS FREQUENCY  PT (By licensed PT), OT (By licensed OT)     PT Frequency: 5x/week OT Frequency: 5x/week            Contractures Contractures Info: Not present    Additional Factors Info  Code Status, Allergies Code Status Info: Full code Allergies Info: see d/c summary           Current Medications (01/13/2023):  This is the current hospital active medication list Current Facility-Administered Medications  Medication Dose Route Frequency Provider Last Rate Last Admin   acetaminophen (TYLENOL) tablet 1,000 mg  1,000 mg Oral Q6H Barnetta Chapel, PA-C   1,000 mg at 01/13/23 1013   [START ON 01/14/2023] apixaban (ELIQUIS) tablet 2.5 mg  2.5 mg Oral BID Barnetta Chapel, PA-C       cephALEXin [facility]) capsule 500 mg  500 mg Oral BID Sheikh, Omair Latif, DO   500 mg at 01/13/23 1013   isosorbide mononitrate (IMDUR) 24 hr  tablet 15 mg  15 mg Oral QHS Berton Mount I, MD   15 mg at 01/12/23 2143   methocarbamol (ROBAXIN) tablet 500 mg  500 mg Oral Q8H PRN Barnetta Chapel, PA-C       metoprolol succinate (TOPROL-XL) 24 hr tablet 50 mg  50 mg Oral QPM Berton Mount I, MD   50 mg at 01/12/23 1718   ondansetron (ZOFRAN-ODT) disintegrating tablet 4 mg  4 mg Oral Q6H PRN Axel Filler, MD       Or   ondansetron [facility]) injection 4 mg  4 mg  Intravenous Q6H PRN Axel Filler, MD       oxyCODONE (Oxy IR/ROXICODONE) immediate release tablet 5-10 mg  5-10 mg Oral Q4H PRN Axel Filler, MD   10 mg at 01/13/23 1014   pantoprazole (PROTONIX) EC tablet 40 mg  40 mg Oral Daily Berton Mount I, MD   40 mg at 01/13/23 1013     Discharge Medications: Please see discharge summary for a list of discharge medications.  Relevant Imaging Results:  Relevant Lab Results:   Additional Information SSN [id number] [place], [place]

## 2023-01-13 NOTE — TOC Initial Note (Signed)
Transition of Care [facility]) - Initial/Assessment Note    Patient Details  Name: Tabitha Black MRN: [id number] Date of Birth: [birth year]  Transition [facility]) CM/SW Contact:    Erin Sons, LCSW Phone Number: 01/13/2023, 3:32 PM  Clinical Narrative:                  CSW met with pt and pt's daughter bedside to discuss disposition. Pt and daughter both confirmed planned for SNF. They are agreeable to SNF w/u and have a preference for [facility]. Pt and her husband had been there in the past. Fl2 completed and bed requests sent in hub.   Expected Discharge Plan: Skilled Nursing Facility Barriers to Discharge: Continued Medical Work up   Patient Goals and CMS Choice            Expected Discharge Plan and Services       Living arrangements for the past 2 months: Single Family Home                             HH Agency: [facility] Date [facility] Agency Contacted: 01/11/23 Time HH Agency Contacted: 1626 Representative spoke with at [facility] Agency: Haywood Lasso  Prior Living Arrangements/Services Living arrangements for the past 2 months: Single Family Home Lives with:: Self Patient language and need for interpreter reviewed:: Yes        Need for Family Participation in Patient Care: Yes (Comment) Care giver support system in place?: Yes (comment)   Criminal Activity/Legal Involvement Pertinent to Current Situation/Hospitalization: No - Comment as needed  Activities of Daily Living      Permission Sought/Granted   Permission granted to share information with : Yes, Verbal Permission Granted  Share Information with NAME: [name] (Daughter)  [phone] (Home Phone)           Emotional Assessment Appearance:: Appears stated age Attitude/Demeanor/Rapport: Engaged Affect (typically observed): Accepting Orientation: : Oriented to Self, Oriented to Place, Oriented to  Time, Oriented to Situation Alcohol / Substance Use: Not Applicable Psych Involvement: No  (comment)  Admission diagnosis:  Spigelian hernia [K43.9] Persistent atrial fibrillation (HCC) [I48.19] Abdominal pain [R10.9] Cellulitis of lower extremity, unspecified laterality [L03.119] Patient Active Problem List   Diagnosis Date Noted   Irreducible Spigelian hernia 01/11/2023   Abdominal pain 01/10/2023   GI bleeding 11/12/2022   Cellulitis of right leg 11/12/2022   Lymphedema of both lower extremities - R > L 10/04/2022   HTN (hypertension) 08/23/2022   Persistent atrial fibrillation (HCC) 08/23/2022   DM2 (diabetes mellitus, type 2) (HCC) 08/23/2022   Pain in joint of left knee 01/06/2021   Adverse effect of antihyperlipidemic and antiarteriosclerotic drugs, initial encounter 08/22/2020   Age-related physical debility 08/22/2020   (HFpEF) heart failure with preserved ejection fraction (HCC) 08/22/2020   Chronic kidney disease (CKD), active medical management without dialysis, stage 5 (HCC) 08/22/2020   Diabetic renal disease (HCC) 08/22/2020   Gastroesophageal reflux disease 08/22/2020   History of malignant neoplasm of skin 08/22/2020   Iron deficiency anemia 08/22/2020   Mixed hyperlipidemia 08/22/2020   Osteoarthritis 08/22/2020   Personal history of colonic polyps 08/22/2020   Skin ulcer (HCC) 08/22/2020   Cervical spondylosis with radiculopathy 07/26/2017   PCP:  Irven Coe, MD Pharmacy:   [facility] [phone] Ginette Otto, [place] - [address] [place] [address] Phone: [phone] Fax: [phone]     Social Determinants of Health (  SDOH) Social History: SDOH Screenings   Food Insecurity: No Food Insecurity (11/12/2022)  Housing: Low Risk  (11/12/2022)  Transportation Needs: No Transportation Needs (11/12/2022)  Utilities: Not At Risk (11/12/2022)  Tobacco Use: Low Risk  (01/12/2023)   SDOH Interventions:     Readmission Risk Interventions    11/16/2022    3:25 PM  Readmission Risk Prevention Plan  Transportation  Screening Complete  PCP or Specialist Appt within 3-5 Days Complete  HRI or Home Care Consult Complete  Palliative Care Screening Not Applicable  Medication Review (RN Care Manager) Complete

## 2023-01-13 NOTE — Progress Notes (Signed)
Physical Therapy Treatment Patient Details Name: Tabitha Black MRN: [id number] DOB: 28-Apr-1933 Today's Date: 01/13/2023   History of Present Illness 89 year old F presenting with intermittent abdominal pain for about 1 week that has become persistent the day of presentation.  Right-sided hernia was not reducible.  CT abdomen and pelvis revealed right-sided spigelian hernia containing a portion of terminal ileum, cecum and proximal ascending colon without bowel obstruction; with PMH of diastolic CHF, A-fib on Eliquis, CKD-5, HTN, HLD, multiple abdominal surgeries, right abdominal hernia and BLE lymphedema    PT Comments    Pt was seen for mobility after surgery yesterday, with pain being her main limitation as well as dizziness from possibly meds.  Her supine BP was 137/62, sitting 151/80 and standing 143/62.  Pulses and sats were all WFL with pulses 77-87 and sats all 98%.  Follow up with her to progress distances and independence, and pt is expected to have discharge to rehab setting as outlined on POC.  Recommendations for follow up therapy are one component of a multi-disciplinary discharge planning process, led by the attending physician.  Recommendations may be updated based on patient status, additional functional criteria and insurance authorization.  Follow Up Recommendations       Assistance Recommended at Discharge Frequent or constant Supervision/Assistance  Patient can return home with the following A lot of help with walking and/or transfers;A lot of help with bathing/dressing/bathroom;Assistance with cooking/housework;Assist for transportation;Help with stairs or ramp for entrance   Equipment Recommendations  None recommended by PT    Recommendations for Other Services       Precautions / Restrictions Precautions Precautions: Fall Precaution Comments: observed log roll technique to get up to minimize abdominal discomfort Restrictions Weight Bearing Restrictions: No      Mobility  Bed Mobility Overal bed mobility: Needs Assistance Bed Mobility: Supine to Sit     Supine to sit: Mod assist          Transfers Overall transfer level: Needs assistance Equipment used: Rolling walker (2 wheels) Transfers: Sit to/from Stand Sit to Stand: Min assist, Mod assist   Step pivot transfers: Min assist       General transfer comment: min assist after initially requiring mod assist with more confidence and better sequencing    Ambulation/Gait Ambulation/Gait assistance: Min assist Gait Distance (Feet): 10 Feet (sidesteps side of bed) Assistive device: Rolling walker (2 wheels) Gait Pattern/deviations: Step-to pattern, Decreased stride length, Shuffle, Wide base of support Gait velocity: reduced Gait velocity interpretation: <1.31 ft/sec, indicative of household ambulator Pre-gait activities: standing balance correction General Gait Details: pt is careful with stepping and a bit fearful of increasing pain   Stairs             Wheelchair Mobility    Modified Rankin (Stroke Patients Only)       Balance Overall balance assessment: Needs assistance Sitting-balance support: Feet supported Sitting balance-Leahy Scale: Fair     Standing balance support: Bilateral upper extremity supported, During functional activity Standing balance-Leahy Scale: Poor Standing balance comment: requires cues initially for steadying on walker                            Cognition Arousal/Alertness: Awake/alert Behavior During Therapy: WFL for tasks assessed/performed Overall Cognitive Status: Within Functional Limits for tasks assessed  Exercises      General Comments General comments (skin integrity, edema, etc.): Pt was assisted to get up to walk and sidesteps with dizzy feelings.  When BP was checked no orthostatic findings were there, nor was pulse or sat too high or low  respectively      Pertinent Vitals/Pain Pain Assessment Pain Assessment: Faces Faces Pain Scale: Hurts little more Pain Location: R lower abd surgery site, B feet Pain Descriptors / Indicators: Grimacing, Guarding Pain Intervention(s): Limited activity within patient's tolerance, Monitored during session, Premedicated before session, Repositioned    Home Living                          Prior Function            PT Goals (current goals can now be found in the care plan section) Acute Rehab PT Goals Patient Stated Goal: decr pain; recover well enough to go home Progress towards PT goals: Progressing toward goals    Frequency    Min 3X/week      PT Plan Current plan remains appropriate    Co-evaluation              AM-PAC PT "6 Clicks" Mobility   Outcome Measure  Help needed turning from your back to your side while in a flat bed without using bedrails?: A Lot Help needed moving from lying on your back to sitting on the side of a flat bed without using bedrails?: A Lot Help needed moving to and from a bed to a chair (including a wheelchair)?: A Lot Help needed standing up from a chair using your arms (e.g., wheelchair or bedside chair)?: A Lot Help needed to walk in hospital room?: A Lot Help needed climbing 3-5 steps with a railing? : Total 6 Click Score: 11    End of Session Equipment Utilized During Treatment: Gait belt Activity Tolerance: Patient limited by pain;Treatment limited secondary to medical complications (Comment) (dizziness) Patient left: in chair;with call bell/phone within reach;with family/visitor present Nurse Communication: Mobility status PT Visit Diagnosis: Unsteadiness on feet (R26.81);Other abnormalities of gait and mobility (R26.89);Muscle weakness (generalized) (M62.81);Difficulty in walking, not elsewhere classified (R26.2)     Time: 3329-5188 PT Time Calculation (min) (ACUTE ONLY): 32 min  Charges:  $Gait Training:  8-22 mins $Therapeutic Activity: 8-22 mins                 Ivar Drape 01/13/2023, 1:18 PM  Samul Dada, PT PhD Acute Rehab Dept. Number: [facility] [id number] and [facility] [phone]

## 2023-01-13 NOTE — Progress Notes (Signed)
No overt heart failure issues postop. Creatinine improving. BP acceptable. Ready to start full diet. Resume Eliquis if OK w surgery team. Continue metoprolol uninterrupted. Will sign off. Please re-consult if needed. Has an appt 02/23/2023 with Tabitha Newcomer, PA.

## 2023-01-13 NOTE — Progress Notes (Signed)
[facility] Progress Note    Assessment/ Plan:   AKI on CKD4-5 -baseline Cr most recently has been in the mid 2's to 3's range. Underlying CKD presumed to be secondary to HTN and CRS -AKI likely secondary to fluctuating volume status/CRS. Cr down to 3.6, will continue to hold lasix for now -no obstruction on CT. UA bland -no indication for dialysis at this time, volume status is stable and not exhibiting any uremic symptoms -renal replacement therapy candidacy: patient, daughter, and I had an extensive conversation in regards to renal replacement therapy on 6/25. I did express my concerns in regards to her functional status and advanced age. Tabitha Black has been thinking about this for quite some time. I did discuss process of initiation and timing. I also discussed NOT doing dialysis as well. They have a lot to discuss and think about. Will continue to advance conversations if necessary. -Avoid nephrotoxic medications including NSAIDs and iodinated intravenous contrast exposure unless the latter is absolutely indicated.  Preferred narcotic agents for pain control are hydromorphone, fentanyl, and methadone. Morphine should not be used. Avoid Baclofen and avoid oral sodium phosphate and magnesium citrate based laxatives / bowel preps. Continue strict Input and Output monitoring. Will monitor the patient closely with you and intervene or adjust therapy as indicated by changes in clinical status/labs    RLQ Hernia -surgery following, s/p hernia repair with mesh 6/26   Chronic diastolic CHF Lymphedema -d/c'ed lasix. Monitor daily weights and strict I/O.Will reassess in regards to diuretic dosing (spot diuresis preferred for now, no need for lasix today)   HTN -BP currently acceptable   Anemia of CKD -receives ESA outpatient. Retacrit 10,000 units every 2 weeks, last dose 6/18. Hgb currently 9.0. Due for ESA next week   Secondary hyperparathyroidism, CKD-MBD -renal diet for now,  PO4 up to 6.2, changed to renal diet  Subjective:   Patient seen and examined bedside. No acute events. Daughter at bedside. Ate breakfast this AM, currently on IVF. Has some post op pain as expected. Expecting to move around today.   Objective:   BP 137/62 (BP Location: Right Arm)   Pulse 66   Temp 98.4 F (36.9 C) (Oral)   Resp 12   Ht 5\' 3"  (1.6 m)   Wt 72.5 kg   SpO2 99%   BMI 28.31 kg/m   Intake/Output Summary (Last 24 hours) at 01/13/2023 1057 Last data filed at 01/12/2023 2030 Gross per 24 hour  Intake 672.14 ml  Output 250 ml  Net 422.14 ml   Weight change: 0 kg  Physical Exam: Gen:NAD CVS: RRR Resp: CTA bb/l Abd: ND Ext: lymphedema b/l Les, RLE tender to light touch Neuro: sleepy but arousable  Imaging: ECHOCARDIOGRAM COMPLETE  Result Date: 01/11/2023    ECHOCARDIOGRAM REPORT   Patient Name:   Tabitha Black Date of Exam: 01/11/2023 Medical Rec #:  [id number]     Height:       63.0 in Accession #:    [id number]    Weight:       155.0 lb Date of Birth:  04-12-1933     BSA:          1.735 m Patient Age:    89 years      BP:           139/52 mmHg Patient Gender: F             HR:           82 bpm.  Exam Location:  Inpatient Procedure: 2D Echo, Cardiac Doppler and Color Doppler Indications:    Dyspnea R06.00  History:        Patient has no prior history of Echocardiogram examinations.                 Arrythmias:Atrial Fibrillation; Risk Factors:Hypertension,                 Diabetes and Dyslipidemia. CKD, stage 5.  Sonographer:    Lucendia Herrlich Referring Phys: [phone] HAO MENG IMPRESSIONS  1. Left ventricular ejection fraction, by estimation, is 60 to 65%. The left ventricle has normal function. The left ventricle has no regional wall motion abnormalities. Left ventricular diastolic parameters are indeterminate.  2. Right ventricular systolic function is mildly reduced. The right ventricular size is severely enlarged. There is severely elevated pulmonary artery systolic  pressure.  3. Left atrial size was severely dilated.  4. Right atrial size was severely dilated.  5. The mitral valve is normal in structure. Mild to moderate mitral valve regurgitation. No evidence of mitral stenosis.  6. Tricuspid valve regurgitation is moderate to severe.  7. The aortic valve is normal in structure. Aortic valve regurgitation is not visualized. Aortic valve sclerosis is present, with no evidence of aortic valve stenosis.  8. The inferior vena cava is dilated in size with <50% respiratory variability, suggesting right atrial pressure of 15 mmHg. FINDINGS  Left Ventricle: Left ventricular ejection fraction, by estimation, is 60 to 65%. The left ventricle has normal function. The left ventricle has no regional wall motion abnormalities. The left ventricular internal cavity size was normal in size. There is  no left ventricular hypertrophy. Left ventricular diastolic parameters are indeterminate. Right Ventricle: The right ventricular size is severely enlarged. No increase in right ventricular wall thickness. Right ventricular systolic function is mildly reduced. There is severely elevated pulmonary artery systolic pressure. The tricuspid regurgitant velocity is 3.60 m/s, and with an assumed right atrial pressure of 15 mmHg, the estimated right ventricular systolic pressure is 66.8 mmHg. Left Atrium: Left atrial size was severely dilated. Right Atrium: Right atrial size was severely dilated. Pericardium: There is no evidence of pericardial effusion. Mitral Valve: The mitral valve is normal in structure. Mild to moderate mitral valve regurgitation. No evidence of mitral valve stenosis. Tricuspid Valve: The tricuspid valve is normal in structure. Tricuspid valve regurgitation is moderate to severe. No evidence of tricuspid stenosis. Aortic Valve: The aortic valve is normal in structure. Aortic valve regurgitation is not visualized. Aortic valve sclerosis is present, with no evidence of aortic valve  stenosis. Aortic valve mean gradient measures 7.0 mmHg. Aortic valve peak gradient measures 12.5 mmHg. Aortic valve area, by VTI measures 1.71 cm. Pulmonic Valve: The pulmonic valve was normal in structure. Pulmonic valve regurgitation is trivial. No evidence of pulmonic stenosis. Aorta: The aortic root is normal in size and structure. Venous: The inferior vena cava is dilated in size with less than 50% respiratory variability, suggesting right atrial pressure of 15 mmHg. IAS/Shunts: No atrial level shunt detected by color flow Doppler.  LEFT VENTRICLE PLAX 2D LVIDd:         4.40 cm   Diastology LVIDs:         2.50 cm   LV e' medial:    7.10 cm/s LV PW:         0.90 cm   LV E/e' medial:  14.1 LV IVS:        0.90 cm   LV  e' lateral:   12.20 cm/s LVOT diam:     1.80 cm   LV E/e' lateral: 8.2 LV SV:         54 LV SV Index:   31 LVOT Area:     2.54 cm  RIGHT VENTRICLE             IVC RV S prime:     14.30 cm/s  IVC diam: 2.80 cm TAPSE (M-mode): 1.5 cm LEFT ATRIUM              Index        RIGHT ATRIUM           Index LA diam:        4.90 cm  2.82 cm/m   RA Area:     30.70 cm LA Vol (A2C):   99.0 ml  57.06 ml/m  RA Volume:   105.80 ml 60.98 ml/m LA Vol (A4C):   111.0 ml 63.97 ml/m LA Biplane Vol: 114.0 ml 65.70 ml/m  AORTIC VALVE AV Area (Vmax):    1.84 cm AV Area (Vmean):   1.74 cm AV Area (VTI):     1.71 cm AV Vmax:           177.00 cm/s AV Vmean:          116.000 cm/s AV VTI:            0.318 m AV Peak Grad:      12.5 mmHg AV Mean Grad:      7.0 mmHg LVOT Vmax:         127.80 cm/s LVOT Vmean:        79.120 cm/s LVOT VTI:          0.213 m LVOT/AV VTI ratio: 0.67  AORTA Ao Root diam: 2.90 cm Ao Asc diam:  2.90 cm MITRAL VALVE               TRICUSPID VALVE MV Area (PHT): 4.21 cm    TR Peak grad:   51.8 mmHg MV Decel Time: 180 msec    TR Vmax:        360.00 cm/s MR Peak grad: 63.4 mmHg MR Vmax:      398.00 cm/s  SHUNTS MV E velocity: 99.80 cm/s  Systemic VTI:  0.21 m MV A velocity: 39.30 cm/s  Systemic Diam:  1.80 cm MV E/A ratio:  2.54 Kardie Tobb DO Electronically signed by Thomasene Ripple DO Signature Date/Time: 01/11/2023/8:09:24 PM    Final     Labs: BMET Recent Labs  Lab 01/10/23 3810 01/10/23 1751 01/10/23 1339 01/11/23 0046 01/12/23 0011 01/12/23 0659 01/13/23 0440  NA 134* 137  --  134* 135 136 137  K 3.5 3.6  --  3.7 3.5 3.7 3.5  CL 96* 97*  --  97* 93* 94* 99  CO2 23  --   --  25 25 23 26   GLUCOSE 119* 118*  --  96 114* 110* 126*  BUN 104* 110*  --  100* 101* 99* 94*  CREATININE 3.89* 4.00*  --  3.89* 4.16* 4.09* 3.62*  CALCIUM 9.2  --   --  8.9 9.4 9.6 9.1  PHOS  --   --  5.2* 5.4* 4.9*  --  6.2*   CBC Recent Labs  Lab 01/10/23 0639 01/10/23 0651 01/11/23 0046 01/12/23 0011 01/13/23 0440  WBC 4.7  --  5.2 6.5 4.5  NEUTROABS 2.2  --   --   --  3.1  HGB  9.9* 11.6* 9.1* 9.4* 9.0*  HCT 30.7* 34.0* 28.2* 29.1* 28.2*  MCV 96.2  --  94.9 92.1 94.9  PLT 183  --  177 174 161    Medications:     acetaminophen  1,000 mg Oral Q6H   [START ON 01/14/2023] apixaban  2.5 mg Oral BID   cephALEXin  500 mg Oral BID   isosorbide mononitrate  15 mg Oral QHS   metoprolol succinate  50 mg Oral QPM   pantoprazole  40 mg Oral Daily   potassium chloride  40 mEq Oral Once      Anthony Sar, MD [facility] 01/13/2023, 10:57 AM

## 2023-01-13 NOTE — Care Management Important Message (Signed)
Important Message  Patient Details  Name: Tabitha Black MRN: [id number] Date of Birth: 04/23/1933   Medicare Important Message Given:  Yes     Sherilyn Banker 01/13/2023, 1:52 PM

## 2023-01-13 NOTE — Progress Notes (Signed)
PT Cancellation Note  Patient Details Name: Tabitha Black MRN: [id number] DOB: 12-14-1932   Cancelled Treatment:    Reason Eval/Treat Not Completed: Other (comment). Pt having nursing care, reattempt at another time.   Ivar Drape 01/13/2023, 10:57 AM  Samul Dada, PT PhD Acute Rehab Dept. Number: [facility] [id number] and [facility] [phone]

## 2023-01-13 NOTE — Progress Notes (Signed)
PROGRESS NOTE    Tabitha Black  ZOX:[id number] DOB: 05-12-1933 DOA: 01/10/2023 PCP: Irven Coe, MD   Brief Narrative:  The patient is a 89 year old Caucasian female with a past medical history significant for but limited to diastolic congestive heart failure, atrial fibrillation on anticoagulation with Eliquis, late stage CKD stage IV early 5, GYN, hyperlipidemia, history of multiple abdominal surgeries as well as right abdominal hernia and bilateral lower extremity lymphedema who presented with intermittent abdominal pain for about a week that became persistent on the day of presentation.  A right-sided hernia is not reducible and CT of the abdomen pelvis was done and revealed the right sided Spigelian manage contained a portion of the terminal ileum, cecum and proximal ascending colon without bowel obstruction.  She was noted to be hemodynamically stable and creatinine was around her baseline with an elevated BUN of 110.  General surgery was consulted in the plan for surgical intervention on 01/12/2023 and cardiology was consulted for cardiac clearance as well as nephrology being consulted for AKI on CKD and azotemia.  She is now out of the operating room and having some abdominal discomfort.  Her left foot remains erythematous and warm and she is being treated for outpatient cellulitis which will continue antibiotics now to complete 3 more days. She is improving and PT/OT recommending SNF.   Will resume her anticoagulation with apixaban on 01/14/2023.  Assessment and Plan:  Abdominal pain due to irreducible spigelian hernia post robotic assisted right lower quadrant incisional hernia repair with mesh postoperative day 1 by Dr. Derrell Lolling -Patient with known history of abdominal hernia.  -Previously reducible.  Presents with significant pain.  No evidence of obstruction.  Passing flatus.  No bowel movement since admission.  No nausea or vomiting. -General surgery following-plan for laparoscopic assisted  incisional hernia repair on 6/26. -Cardiology has been consulted for cardiac clearance  -N.p.o. after midnight further diet advancement per general surgery now and then advance her to a regular diet -PT/OT recommending SNF -General surgery recommending multimodal pain control feel that she is surgically stable but needs mobilization assessment prior to discharge. -Surgery feels that we can resume her anticoagulation with Eliquis on 01/06/2023   Chronic Diastolic CHF -Appears euvolemic except for chronic BLE lymphedema.  TTE in 2018 with LVEF of 60 to 65%, no RWMA but G2 DD.  On Lasix and metolazone at home.   -Converted to IV Lasix on admission and she was continued IV Lasix 80 mg twice daily but held by Nephrology recommending to continue to hold and recommending spot diuresis preferred and no need for Lasix today -Cardiology consulted for preoperative clearance and she had an increased moderate risk -Strict intake and output, daily weights, renal functions and electrolytes  Intake/Output Summary (Last 24 hours) at 01/13/2023 1807 Last data filed at 01/12/2023 2030 Gross per 24 hour  Intake --  Output 250 ml  Net -250 ml  -She has not had any heart failure overtly postoperatively and cardiology feels that she is okay and recommending following up in outpatient setting with her regular scheduled appointment on 02/23/2023 with Tereso Newcomer recommending reconsulting if necessary  Bilateral Lower Extremity Lymphedema with concern for Right Foot Cellulitis, improving  -Resume Abx for Right Foot Cellulitis and will continue for 3 mor days -Diuretics as above being held for now   CKD-V/Azotemia Elevated Anion Gap -Not uremic.  Followed by Dr. Allena Katz. Recent Labs  Lab 01/04/23 1346 01/10/23 8119 01/10/23 1478 01/11/23 0046 01/12/23 0011 01/12/23 2956 01/13/23 0440  BUN 97* 104* 110* 100* 101* 99* 94*  CREATININE 3.90* 3.89* 4.00* 3.89* 4.16* 4.09* 3.62*  -Nephrology consulted and will see  patient -Monitor daily   Bone Mineral Disorder -Has mild hyperphosphatemia. -May need Phos binder but defer to nephrology   Anemia of Chronic Disease -Hgb/Hct Trend: Recent Labs  Lab 01/04/23 1242 01/04/23 1346 01/10/23 0639 01/10/23 0651 01/11/23 0046 01/12/23 0011 01/13/23 0440  HGB 9.8* 10.0* 9.9* 11.6* 9.1* 9.4* 9.0*  HCT  --  31.8* 30.7* 34.0* 28.2* 29.1* 28.2*  MCV  --  93.8 96.2  --  94.9 92.1 94.9  -Check anemia panel in the morning -ESA per nephrology if deemed appropriate -Continue to monitor for S/Sx of Bleeding; No overt bleeding noted -Repeat CBC in the AM   Persistent atrial fibrillation -On Eliquis at home.  Rate controlled. -Continue metoprolol -IV heparin for anticoagulation.  Eliquis on hold for surgery and per surgery resume on 01/14/2023 -Optimize electrolytes   Hypertension -Normotensive for most part -Continue current regimen -Continue to Monitor BP per Protocol   Hypomagnesemia -Patient's Mag Level Trend: Recent Labs  Lab 01/10/23 1339 01/11/23 0046 01/12/23 0011 01/13/23 0440  MG 1.4* 1.4* 1.7 1.8  -Continue to Monitor and Replete as Necessary -Repeat Mag in the AM   Generalized weakness/Physical Deconditioning -PT/OT to further evaluate and Treat and recommending SNF  Hyperbilirubinemia -T Bili Trend: Recent Labs  Lab 01/04/23 1346 01/10/23 0639 01/12/23 0659 01/13/23 0440  BILITOT 1.1 0.8 1.4* 0.6  -Continue to Monitor and Trend and repeat CMP in the AM  Hypoalbuminemia -Patient's Albumin Trend: Recent Labs  Lab 01/04/23 1346 01/10/23 0639 01/11/23 0046 01/12/23 0011 01/12/23 0659 01/13/23 0440  ALBUMIN 4.2 3.5 2.9* 3.0* 3.1* 2.6*  -Continue to Monitor and Trend and repeat CMP in the AM  DVT prophylaxis: apixaban (ELIQUIS) tablet 2.5 mg Start: 01/14/23 1000 SCD's Start: 01/12/23 1341 apixaban (ELIQUIS) tablet 2.5 mg    Code Status: Full Code Family Communication: Discussed with the Daughter at  bedside  Disposition Plan:  Level of care: Telemetry Medical Status is: Inpatient Remains inpatient appropriate because: His further clinical improvement and clearance by the specialist and will need to go to SNF for further rehabilitation.   Consultants:  General Surgery Nephrology Cardiology  Procedures:  PROCEDURE:  Procedure(s): XI ROBOTIC ASSISTED INCISIONAL  HERNIA REPAIR WITH MESH (N/A) TAPP  ECHOCARDIOGRAM IMPRESSIONS    1. Left ventricular ejection fraction, by estimation, is 60 to 65%. The  left ventricle has normal function. The left ventricle has no regional  wall motion abnormalities. Left ventricular diastolic parameters are  indeterminate.   2. Right ventricular systolic function is mildly reduced. The right  ventricular size is severely enlarged. There is severely elevated  pulmonary artery systolic pressure.   3. Left atrial size was severely dilated.   4. Right atrial size was severely dilated.   5. The mitral valve is normal in structure. Mild to moderate mitral valve  regurgitation. No evidence of mitral stenosis.   6. Tricuspid valve regurgitation is moderate to severe.   7. The aortic valve is normal in structure. Aortic valve regurgitation is  not visualized. Aortic valve sclerosis is present, with no evidence of  aortic valve stenosis.   8. The inferior vena cava is dilated in size with <50% respiratory  variability, suggesting right atrial pressure of 15 mmHg.   FINDINGS   Left Ventricle: Left ventricular ejection fraction, by estimation, is 60  to 65%. The left ventricle has normal function. The left  ventricle has no  regional wall motion abnormalities. The left ventricular internal cavity  size was normal in size. There is   no left ventricular hypertrophy. Left ventricular diastolic parameters  are indeterminate.   Right Ventricle: The right ventricular size is severely enlarged. No  increase in right ventricular wall thickness. Right  ventricular systolic  function is mildly reduced. There is severely elevated pulmonary artery  systolic pressure. The tricuspid  regurgitant velocity is 3.60 m/s, and with an assumed right atrial  pressure of 15 mmHg, the estimated right ventricular systolic pressure is  66.8 mmHg.   Left Atrium: Left atrial size was severely dilated.   Right Atrium: Right atrial size was severely dilated.   Pericardium: There is no evidence of pericardial effusion.   Mitral Valve: The mitral valve is normal in structure. Mild to moderate  mitral valve regurgitation. No evidence of mitral valve stenosis.   Tricuspid Valve: The tricuspid valve is normal in structure. Tricuspid  valve regurgitation is moderate to severe. No evidence of tricuspid  stenosis.   Aortic Valve: The aortic valve is normal in structure. Aortic valve  regurgitation is not visualized. Aortic valve sclerosis is present, with  no evidence of aortic valve stenosis. Aortic valve mean gradient measures  7.0 mmHg. Aortic valve peak gradient  measures 12.5 mmHg. Aortic valve area, by VTI measures 1.71 cm.   Pulmonic Valve: The pulmonic valve was normal in structure. Pulmonic valve  regurgitation is trivial. No evidence of pulmonic stenosis.   Aorta: The aortic root is normal in size and structure.   Venous: The inferior vena cava is dilated in size with less than 50%  respiratory variability, suggesting right atrial pressure of 15 mmHg.   IAS/Shunts: No atrial level shunt detected by color flow Doppler.   LEFT VENTRICLE  PLAX 2D  LVIDd:         4.40 cm   Diastology  LVIDs:         2.50 cm   LV e' medial:    7.10 cm/s  LV PW:         0.90 cm   LV E/e' medial:  14.1  LV IVS:        0.90 cm   LV e' lateral:   12.20 cm/s  LVOT diam:     1.80 cm   LV E/e' lateral: 8.2  LV SV:         54  LV SV Index:   31  LVOT Area:     2.54 cm     RIGHT VENTRICLE             IVC  RV S prime:     14.30 cm/s  IVC diam: 2.80 cm  TAPSE  (M-mode): 1.5 cm   LEFT ATRIUM              Index        RIGHT ATRIUM           Index  LA diam:        4.90 cm  2.82 cm/m   RA Area:     30.70 cm  LA Vol (A2C):   99.0 ml  57.06 ml/m  RA Volume:   105.80 ml 60.98 ml/m  LA Vol (A4C):   111.0 ml 63.97 ml/m  LA Biplane Vol: 114.0 ml 65.70 ml/m   AORTIC VALVE  AV Area (Vmax):    1.84 cm  AV Area (Vmean):   1.74 cm  AV Area (VTI):  1.71 cm  AV Vmax:           177.00 cm/s  AV Vmean:          116.000 cm/s  AV VTI:            0.318 m  AV Peak Grad:      12.5 mmHg  AV Mean Grad:      7.0 mmHg  LVOT Vmax:         127.80 cm/s  LVOT Vmean:        79.120 cm/s  LVOT VTI:          0.213 m  LVOT/AV VTI ratio: 0.67    AORTA  Ao Root diam: 2.90 cm  Ao Asc diam:  2.90 cm   MITRAL VALVE               TRICUSPID VALVE  MV Area (PHT): 4.21 cm    TR Peak grad:   51.8 mmHg  MV Decel Time: 180 msec    TR Vmax:        360.00 cm/s  MR Peak grad: 63.4 mmHg  MR Vmax:      398.00 cm/s  SHUNTS  MV E velocity: 99.80 cm/s  Systemic VTI:  0.21 m  MV A velocity: 39.30 cm/s  Systemic Diam: 1.80 cm  MV E/A ratio:  2.54    Antimicrobials:  Anti-infectives (From admission, onward)    Start     Dose/Rate Route Frequency Ordered Stop   01/12/23 2200  cephALEXin (KEFLEX) capsule 500 mg        500 mg Oral 2 times daily 01/12/23 1800 01/16/23 2359   01/12/23 1019  sodium chloride 0.9 % with cefTRIAXone (ROCEPHIN) ADS Med       Note to Pharmacy: Susy [facility] L: cabinet override      01/12/23 1019 01/12/23 1047   01/12/23 0600  cefTRIAXone (ROCEPHIN) 2 g in sodium chloride 0.9 % 100 mL IVPB        2 g 200 mL/hr over 30 Minutes Intravenous On call to O.R. 01/11/23 1045 01/12/23 1042   01/10/23 0945  cefTRIAXone (ROCEPHIN) 1 g in sodium chloride 0.9 % 100 mL IVPB        1 g 200 mL/hr over 30 Minutes Intravenous  Once 01/10/23 0938 01/10/23 1114       Subjective: Seen and examined at bedside and was having some abdominal discomfort.  Thinks foot  is doing little bit better not as swollen or erythematous.  No chest pain or shortness of breath.  No nausea or vomiting.  Wanting to go to rehab.  No other concerns or complaints at this time.  Objective: Vitals:   01/13/23 0534 01/13/23 0829 01/13/23 1148 01/13/23 1723  BP: (!) 114/59 137/62 (!) 143/62 (!) 131/56  Pulse: 80 66 77 72  Resp: 16 12 17 17   Temp: 98.4 F (36.9 C) 98.4 F (36.9 C) 97.7 F (36.5 C) (!) 97.5 F (36.4 C)  TempSrc: Oral Oral Oral Oral  SpO2: 95% 99% 98% 100%  Weight:      Height:        Intake/Output Summary (Last 24 hours) at 01/13/2023 1821 Last data filed at 01/12/2023 2030 Gross per 24 hour  Intake --  Output 250 ml  Net -250 ml   Filed Weights   01/10/23 0640 01/12/23 0346 01/12/23 0904  Weight: 70.3 kg 72.5 kg 72.5 kg   Examination: Physical Exam:  Constitutional: WN/WD overweight Caucasian female in no acute  distress Respiratory: Diminished to auscultation bilaterally, no wheezing, rales, rhonchi or crackles. Normal respiratory effort and patient is not tachypenic. No accessory muscle use.  Unlabored breathing Cardiovascular: RRR, no murmurs / rubs / gallops. S1 and S2 auscultated.  Has mild lower extremity edema Abdomen: Soft, slightly-tender, distended secondary to body habitus. Bowel sounds positive.  GU: Deferred. Musculoskeletal: No clubbing / cyanosis of digits/nails. No joint deformity upper and lower extremities. .  Skin: No rashes, lesions, ulcers and right foot erythema is slightly improved Neurologic: CN 2-12 grossly intact with no focal deficits. Romberg sign and cerebellar reflexes not assessed.  Psychiatric: Normal judgment and insight. Alert and oriented x 3. Normal mood and appropriate affect.   Data Reviewed: I have personally reviewed following labs and imaging studies  CBC: Recent Labs  Lab 01/10/23 0639 01/10/23 0651 01/11/23 0046 01/12/23 0011 01/13/23 0440  WBC 4.7  --  5.2 6.5 4.5  NEUTROABS 2.2  --   --   --   3.1  HGB 9.9* 11.6* 9.1* 9.4* 9.0*  HCT 30.7* 34.0* 28.2* 29.1* 28.2*  MCV 96.2  --  94.9 92.1 94.9  PLT 183  --  177 174 161   Basic Metabolic Panel: Recent Labs  Lab 01/10/23 0639 01/10/23 0651 01/10/23 1339 01/11/23 0046 01/12/23 0011 01/12/23 0659 01/13/23 0440  NA 134* 137  --  134* 135 136 137  K 3.5 3.6  --  3.7 3.5 3.7 3.5  CL 96* 97*  --  97* 93* 94* 99  CO2 23  --   --  25 25 23 26   GLUCOSE 119* 118*  --  96 114* 110* 126*  BUN 104* 110*  --  100* 101* 99* 94*  CREATININE 3.89* 4.00*  --  3.89* 4.16* 4.09* 3.62*  CALCIUM 9.2  --   --  8.9 9.4 9.6 9.1  MG  --   --  1.4* 1.4* 1.7  --  1.8  PHOS  --   --  5.2* 5.4* 4.9*  --  6.2*   GFR: Estimated Creatinine Clearance: 10.4 mL/min (A) (by C-G formula based on SCr of 3.62 mg/dL (H)). Liver Function Tests: Recent Labs  Lab 01/10/23 0639 01/11/23 0046 01/12/23 0011 01/12/23 0659 01/13/23 0440  AST 26  --   --  27 22  ALT 13  --   --  14 11  ALKPHOS 28*  --   --  27* 24*  BILITOT 0.8  --   --  1.4* 0.6  PROT 6.7  --   --  6.4* 5.8*  ALBUMIN 3.5 2.9* 3.0* 3.1* 2.6*   Recent Labs  Lab 01/10/23 0639  LIPASE 44   No results for input(s): "AMMONIA" in the last 168 hours. Coagulation Profile: No results for input(s): "INR", "PROTIME" in the last 168 hours. Cardiac Enzymes: No results for input(s): "CKTOTAL", "CKMB", "CKMBINDEX", "TROPONINI" in the last 168 hours. BNP (last 3 results) No results for input(s): "PROBNP" in the last 8760 hours. HbA1C: No results for input(s): "HGBA1C" in the last 72 hours. CBG: Recent Labs  Lab 01/12/23 2028 01/12/23 2151 01/13/23 0824 01/13/23 1147 01/13/23 1720  GLUCAP 158* 147* 136* 177* 124*   Lipid Profile: No results for input(s): "CHOL", "HDL", "LDLCALC", "TRIG", "CHOLHDL", "LDLDIRECT" in the last 72 hours. Thyroid Function Tests: No results for input(s): "TSH", "T4TOTAL", "FREET4", "T3FREE", "THYROIDAB" in the last 72 hours. Anemia Panel: No results for  input(s): "VITAMINB12", "FOLATE", "FERRITIN", "TIBC", "IRON", "RETICCTPCT" in the last 72 hours. Sepsis Labs:  Recent Labs  Lab 01/10/23 0735 01/10/23 0846  LATICACIDVEN 1.1 1.0   No results found for this or any previous visit (from the past 240 hour(s)).   Radiology Studies: No results found.  Scheduled Meds:  acetaminophen  1,000 mg Oral Q6H   [START ON 01/14/2023] apixaban  2.5 mg Oral BID   cephALEXin  500 mg Oral BID   isosorbide mononitrate  15 mg Oral QHS   metoprolol succinate  50 mg Oral QPM   pantoprazole  40 mg Oral Daily   Continuous Infusions:   LOS: 3 days   Marguerita Merles, DO [facility] Available via Epic secure chat 7am-7pm After these hours, please refer to coverage provider listed on [email or web address] 01/13/2023, 6:21 PM

## 2023-01-13 NOTE — Evaluation (Signed)
Occupational Therapy Evaluation Patient Details Name: Tabitha Black MRN: [id number] DOB: 29-Dec-1932 Today's Date: 01/13/2023   History of Present Illness 89 year old F presenting with intermittent abdominal pain for about 1 week that has become persistent the day of presentation.  Right-sided hernia was not reducible.  CT abdomen and pelvis revealed right-sided spigelian hernia containing a portion of terminal ileum, cecum and proximal ascending colon without bowel obstruction; with PMH of diastolic CHF, A-fib on Eliquis, CKD-5, HTN, HLD, multiple abdominal surgeries, right abdominal hernia and BLE lymphedema   Clinical Impression   Pt seen for above diagnosis. Pt's daughter present during sesssion, has some pain at rest, increased with movement. Pt lives alone in 1 story apartment, states she has HHPT currently, [facility]. Pt independent with ADLs at home at baseline, currently requires significant assistance for transfers, LB dressing, bathing. Pt would benefit greatly from post acute rehab <3hrs/day to improve functional strength to baseline prior to return home, Pt family not able to physically assist Pt at home. Pt would benefit from acute OT to progress as able.      Recommendations for follow up therapy are one component of a multi-disciplinary discharge planning process, led by the attending physician.  Recommendations may be updated based on patient status, additional functional criteria and insurance authorization.   Assistance Recommended at Discharge Frequent or constant Supervision/Assistance  Patient can return home with the following A little help with walking and/or transfers;A little help with bathing/dressing/bathroom;Assistance with cooking/housework;Assist for transportation;Help with stairs or ramp for entrance    Functional Status Assessment  Patient has had a recent decline in their functional status and demonstrates the ability to make significant  improvements in function in a reasonable and predictable amount of time.  Equipment Recommendations  None recommended by OT    Recommendations for Other Services       Precautions / Restrictions Precautions Precautions: Fall Precaution Comments: log roll technique to get up to minimize abdominal discomfort Restrictions Weight Bearing Restrictions: No      Mobility Bed Mobility Overal bed mobility: Needs Assistance Bed Mobility: Sit to Supine       Sit to supine: Max assist   General bed mobility comments: max A sit to supine for BLE assistance and scooting back in bed    Transfers Overall transfer level: Needs assistance Equipment used: Rolling walker (2 wheels) Transfers: Bed to chair/wheelchair/BSC, Sit to/from Stand Sit to Stand: Mod assist     Step pivot transfers: Min assist     General transfer comment: strong mod to stand, min A for pivot transfer      Balance Overall balance assessment: Needs assistance Sitting-balance support: Feet supported Sitting balance-Leahy Scale: Fair Sitting balance - Comments: sitting EOB, stable, decreased ROM with leaning forward/side to side.   Standing balance support: Bilateral upper extremity supported, During functional activity Standing balance-Leahy Scale: Poor Standing balance comment: reliant on RW for support                           ADL either performed or assessed with clinical judgement   ADL Overall ADL's : Needs assistance/impaired Eating/Feeding: Independent   Grooming: Minimal assistance;Sitting   Upper Body Bathing: Minimal assistance;Sitting   Lower Body Bathing: Maximal assistance;Sitting/lateral leans   Upper Body Dressing : Minimal assistance;Sitting   Lower Body Dressing: Maximal assistance;Sitting/lateral leans   Toilet Transfer: Moderate assistance;BSC/3in1   Toileting- Clothing Manipulation and Hygiene: Moderate assistance;Sitting/lateral lean  General ADL  Comments: Pt requires help with LB ADLs at baseline, decreased ability to reach behind or overhead to perform UB dressing/bathing, min A, difficulty standing.     Vision Baseline Vision/History: 1 Wears glasses;4 Cataracts Ability to See in Adequate Light: 0 Adequate Patient Visual Report: No change from baseline       Perception     Praxis      Pertinent Vitals/Pain Pain Assessment Pain Assessment: 0-10 Pain Score: 2  Faces Pain Scale: Hurts a little bit Pain Location: R lower abd surgery site, B feet Pain Descriptors / Indicators: Grimacing, Guarding Pain Intervention(s): Monitored during session     Hand Dominance Right   Extremity/Trunk Assessment             Communication Communication Communication: HOH   Cognition Arousal/Alertness: Awake/alert Behavior During Therapy: WFL for tasks assessed/performed Overall Cognitive Status: Within Functional Limits for tasks assessed                                       General Comments  Pt was assisted to get up to walk and sidesteps with dizzy feelings.  When BP was checked no orthostatic findings were there, nor was pulse or sat too high or low respectively    Exercises     Shoulder Instructions      Home Living Family/patient expects to be discharged to:: Private residence Living Arrangements: Alone Available Help at Discharge: Family;Available PRN/intermittently Type of Home: House Home Access: Ramped entrance     Home Layout: One level     Bathroom Shower/Tub: Producer, television/film/video: Standard     Home Equipment: Toilet riser;Shower seat;Grab bars - toilet;Grab bars - tub/shower;Other (comment);Rolling Walker (2 wheels)   Additional Comments: Sleeps in lift chair; does not use lift feature of chair (very much); has a life alert-type service      Prior Functioning/Environment Prior Level of Function : Independent/Modified Independent             Mobility Comments:  uses RW ADLs Comments: ind; family assists with transportation and shopping        OT Problem List: Decreased strength;Decreased range of motion;Decreased activity tolerance;Impaired balance (sitting and/or standing);Impaired UE functional use;Pain      OT Treatment/Interventions: Self-care/ADL training;Therapeutic exercise;Neuromuscular education;Energy conservation;DME and/or AE instruction;Patient/family education;Therapeutic activities    OT Goals(Current goals can be found in the care plan section) Acute Rehab OT Goals Patient Stated Goal: to return home, improve strength, decrease pain OT Goal Formulation: With patient/family Time For Goal Achievement: 01/27/23 Potential to Achieve Goals: Good  OT Frequency: Min 2X/week    Co-evaluation              AM-PAC OT "6 Clicks" Daily Activity     Outcome Measure Help from another person eating meals?: None Help from another person taking care of personal grooming?: A Little Help from another person toileting, which includes using toliet, bedpan, or urinal?: A Lot Help from another person bathing (including washing, rinsing, drying)?: A Lot Help from another person to put on and taking off regular upper body clothing?: A Little Help from another person to put on and taking off regular lower body clothing?: A Lot 6 Click Score: 16   End of Session Equipment Utilized During Treatment: Gait belt;Rolling walker (2 wheels) Nurse Communication: Mobility status  Activity Tolerance: Patient tolerated treatment well Patient left: in bed;with  call bell/phone within reach;with family/visitor present  OT Visit Diagnosis: Unsteadiness on feet (R26.81);Other abnormalities of gait and mobility (R26.89);Muscle weakness (generalized) (M62.81);Pain Pain - part of body:  (abdomen)                Time: 1350-1420 OT Time Calculation (min): 30 min Charges:  OT General Charges $OT Visit: 1 Visit OT Evaluation $OT Eval Moderate Complexity: 1  Mod OT Treatments $Self Care/Home Management : 8-22 mins  [place], OTR/L   Alexis Goodell 01/13/2023, 3:11 PM

## 2023-01-14 DIAGNOSIS — E785 Hyperlipidemia, unspecified: Secondary | ICD-10-CM | POA: Diagnosis not present

## 2023-01-14 DIAGNOSIS — K436 Other and unspecified ventral hernia with obstruction, without gangrene: Secondary | ICD-10-CM | POA: Diagnosis not present

## 2023-01-14 DIAGNOSIS — M1990 Unspecified osteoarthritis, unspecified site: Secondary | ICD-10-CM | POA: Diagnosis not present

## 2023-01-14 DIAGNOSIS — N184 Chronic kidney disease, stage 4 (severe): Secondary | ICD-10-CM | POA: Diagnosis not present

## 2023-01-14 DIAGNOSIS — I4819 Other persistent atrial fibrillation: Secondary | ICD-10-CM | POA: Diagnosis not present

## 2023-01-14 DIAGNOSIS — K219 Gastro-esophageal reflux disease without esophagitis: Secondary | ICD-10-CM | POA: Diagnosis not present

## 2023-01-14 DIAGNOSIS — B351 Tinea unguium: Secondary | ICD-10-CM | POA: Diagnosis not present

## 2023-01-14 DIAGNOSIS — I5030 Unspecified diastolic (congestive) heart failure: Secondary | ICD-10-CM | POA: Diagnosis not present

## 2023-01-14 DIAGNOSIS — R609 Edema, unspecified: Secondary | ICD-10-CM | POA: Diagnosis not present

## 2023-01-14 DIAGNOSIS — I469 Cardiac arrest, cause unspecified: Secondary | ICD-10-CM | POA: Diagnosis not present

## 2023-01-14 DIAGNOSIS — R2689 Other abnormalities of gait and mobility: Secondary | ICD-10-CM | POA: Diagnosis not present

## 2023-01-14 DIAGNOSIS — E1121 Type 2 diabetes mellitus with diabetic nephropathy: Secondary | ICD-10-CM | POA: Diagnosis not present

## 2023-01-14 DIAGNOSIS — I83028 Varicose veins of left lower extremity with ulcer other part of lower leg: Secondary | ICD-10-CM | POA: Diagnosis not present

## 2023-01-14 DIAGNOSIS — Z4889 Encounter for other specified surgical aftercare: Secondary | ICD-10-CM | POA: Diagnosis not present

## 2023-01-14 DIAGNOSIS — K469 Unspecified abdominal hernia without obstruction or gangrene: Secondary | ICD-10-CM | POA: Diagnosis not present

## 2023-01-14 DIAGNOSIS — K5900 Constipation, unspecified: Secondary | ICD-10-CM | POA: Diagnosis not present

## 2023-01-14 DIAGNOSIS — M6281 Muscle weakness (generalized): Secondary | ICD-10-CM | POA: Diagnosis not present

## 2023-01-14 DIAGNOSIS — I5032 Chronic diastolic (congestive) heart failure: Secondary | ICD-10-CM | POA: Diagnosis not present

## 2023-01-14 DIAGNOSIS — I872 Venous insufficiency (chronic) (peripheral): Secondary | ICD-10-CM | POA: Diagnosis not present

## 2023-01-14 DIAGNOSIS — K430 Incisional hernia with obstruction, without gangrene: Secondary | ICD-10-CM | POA: Diagnosis not present

## 2023-01-14 DIAGNOSIS — D62 Acute posthemorrhagic anemia: Secondary | ICD-10-CM | POA: Diagnosis not present

## 2023-01-14 DIAGNOSIS — R2681 Unsteadiness on feet: Secondary | ICD-10-CM | POA: Diagnosis not present

## 2023-01-14 DIAGNOSIS — K439 Ventral hernia without obstruction or gangrene: Secondary | ICD-10-CM | POA: Diagnosis not present

## 2023-01-14 DIAGNOSIS — N2581 Secondary hyperparathyroidism of renal origin: Secondary | ICD-10-CM | POA: Diagnosis not present

## 2023-01-14 DIAGNOSIS — D631 Anemia in chronic kidney disease: Secondary | ICD-10-CM | POA: Diagnosis not present

## 2023-01-14 DIAGNOSIS — I129 Hypertensive chronic kidney disease with stage 1 through stage 4 chronic kidney disease, or unspecified chronic kidney disease: Secondary | ICD-10-CM | POA: Diagnosis not present

## 2023-01-14 DIAGNOSIS — I10 Essential (primary) hypertension: Secondary | ICD-10-CM | POA: Diagnosis not present

## 2023-01-14 DIAGNOSIS — Z9889 Other specified postprocedural states: Secondary | ICD-10-CM | POA: Diagnosis not present

## 2023-01-14 DIAGNOSIS — Z7401 Bed confinement status: Secondary | ICD-10-CM | POA: Diagnosis not present

## 2023-01-14 DIAGNOSIS — R1084 Generalized abdominal pain: Secondary | ICD-10-CM | POA: Diagnosis not present

## 2023-01-14 DIAGNOSIS — K922 Gastrointestinal hemorrhage, unspecified: Secondary | ICD-10-CM | POA: Diagnosis not present

## 2023-01-14 DIAGNOSIS — N185 Chronic kidney disease, stage 5: Secondary | ICD-10-CM | POA: Diagnosis not present

## 2023-01-14 DIAGNOSIS — L97511 Non-pressure chronic ulcer of other part of right foot limited to breakdown of skin: Secondary | ICD-10-CM | POA: Diagnosis not present

## 2023-01-14 DIAGNOSIS — I4891 Unspecified atrial fibrillation: Secondary | ICD-10-CM | POA: Diagnosis not present

## 2023-01-14 DIAGNOSIS — I890 Lymphedema, not elsewhere classified: Secondary | ICD-10-CM | POA: Diagnosis not present

## 2023-01-14 DIAGNOSIS — Z7901 Long term (current) use of anticoagulants: Secondary | ICD-10-CM | POA: Diagnosis not present

## 2023-01-14 DIAGNOSIS — M79676 Pain in unspecified toe(s): Secondary | ICD-10-CM | POA: Diagnosis not present

## 2023-01-14 DIAGNOSIS — R109 Unspecified abdominal pain: Secondary | ICD-10-CM | POA: Diagnosis not present

## 2023-01-14 DIAGNOSIS — R1031 Right lower quadrant pain: Secondary | ICD-10-CM | POA: Diagnosis not present

## 2023-01-14 DIAGNOSIS — R600 Localized edema: Secondary | ICD-10-CM | POA: Diagnosis not present

## 2023-01-14 DIAGNOSIS — E559 Vitamin D deficiency, unspecified: Secondary | ICD-10-CM | POA: Diagnosis not present

## 2023-01-14 DIAGNOSIS — H9193 Unspecified hearing loss, bilateral: Secondary | ICD-10-CM | POA: Diagnosis not present

## 2023-01-14 LAB — IRON AND TIBC
Iron: 21 ug/dL — ABNORMAL LOW (ref 28–170)
Saturation Ratios: 8 % — ABNORMAL LOW (ref 10.4–31.8)
TIBC: 273 ug/dL (ref 250–450)
UIBC: 252 ug/dL

## 2023-01-14 LAB — COMPREHENSIVE METABOLIC PANEL
ALT: 14 U/L (ref 0–44)
AST: 26 U/L (ref 15–41)
Albumin: 2.7 g/dL — ABNORMAL LOW (ref 3.5–5.0)
Alkaline Phosphatase: 30 U/L — ABNORMAL LOW (ref 38–126)
Anion gap: 14 (ref 5–15)
BUN: 86 mg/dL — ABNORMAL HIGH (ref 8–23)
CO2: 23 mmol/L (ref 22–32)
Calcium: 9.1 mg/dL (ref 8.9–10.3)
Chloride: 101 mmol/L (ref 98–111)
Creatinine, Ser: 3.41 mg/dL — ABNORMAL HIGH (ref 0.44–1.00)
GFR, Estimated: 13 mL/min — ABNORMAL LOW (ref >60)
Glucose, Bld: 104 mg/dL — ABNORMAL HIGH (ref 70–99)
Potassium: 4 mmol/L (ref 3.5–5.1)
Sodium: 138 mmol/L (ref 135–145)
Total Bilirubin: 0.8 mg/dL (ref 0.3–1.2)
Total Protein: 5.6 g/dL — ABNORMAL LOW (ref 6.5–8.1)

## 2023-01-14 LAB — CBC WITH DIFFERENTIAL/PLATELET
Abs Immature Granulocytes: 0.02 10*3/uL (ref 0.00–0.07)
Basophils Absolute: 0 10*3/uL (ref 0.0–0.1)
Basophils Relative: 0 %
Eosinophils Absolute: 0.4 10*3/uL (ref 0.0–0.5)
Eosinophils Relative: 9 %
HCT: 28.7 % — ABNORMAL LOW (ref 36.0–46.0)
Hemoglobin: 9.1 g/dL — ABNORMAL LOW (ref 12.0–15.0)
Immature Granulocytes: 0 %
Lymphocytes Relative: 18 %
Lymphs Abs: 0.9 10*3/uL (ref 0.7–4.0)
MCH: 30.1 pg (ref 26.0–34.0)
MCHC: 31.7 g/dL (ref 30.0–36.0)
MCV: 95 fL (ref 80.0–100.0)
Monocytes Absolute: 0.5 10*3/uL (ref 0.1–1.0)
Monocytes Relative: 10 %
Neutro Abs: 3.2 10*3/uL (ref 1.7–7.7)
Neutrophils Relative %: 63 %
Platelets: 159 10*3/uL (ref 150–400)
RBC: 3.02 MIL/uL — ABNORMAL LOW (ref 3.87–5.11)
RDW: 15.2 % (ref 11.5–15.5)
WBC: 5.1 10*3/uL (ref 4.0–10.5)
nRBC: 0 % (ref 0.0–0.2)

## 2023-01-14 LAB — RETICULOCYTES
Immature Retic Fract: 4.8 % (ref 2.3–15.9)
RBC.: 3.01 MIL/uL — ABNORMAL LOW (ref 3.87–5.11)
Retic Count, Absolute: 56 10*3/uL (ref 19.0–186.0)
Retic Ct Pct: 1.9 % (ref 0.4–3.1)

## 2023-01-14 LAB — PHOSPHORUS: Phosphorus: 5.1 mg/dL — ABNORMAL HIGH (ref 2.5–4.6)

## 2023-01-14 LAB — FERRITIN: Ferritin: 535 ng/mL — ABNORMAL HIGH (ref 11–307)

## 2023-01-14 LAB — VITAMIN B12: Vitamin B-12: 435 pg/mL (ref 180–914)

## 2023-01-14 LAB — FOLATE: Folate: 13.1 ng/mL (ref >5.9)

## 2023-01-14 LAB — MAGNESIUM: Magnesium: 1.7 mg/dL (ref 1.7–2.4)

## 2023-01-14 LAB — GLUCOSE, CAPILLARY: Glucose-Capillary: 101 mg/dL — ABNORMAL HIGH (ref 70–99)

## 2023-01-14 MED ORDER — OXYCODONE HCL 5 MG PO TABS: 5.0000 mg | ORAL_TABLET | ORAL | 0 refills | Status: DC | PRN

## 2023-01-14 MED ORDER — METHOCARBAMOL 500 MG PO TABS: 500.0000 mg | ORAL_TABLET | Freq: Three times a day (TID) | ORAL | Status: DC | PRN

## 2023-01-14 MED ORDER — ACETAMINOPHEN 500 MG PO TABS: 1000.0000 mg | ORAL_TABLET | Freq: Four times a day (QID) | ORAL | 0 refills | Status: DC

## 2023-01-14 MED ORDER — FUROSEMIDE 20 MG PO TABS: 60.0000 mg | ORAL_TABLET | Freq: Two times a day (BID) | ORAL | Status: DC

## 2023-01-14 MED ORDER — ONDANSETRON 4 MG PO TBDP: 4.0000 mg | ORAL_TABLET | Freq: Four times a day (QID) | ORAL | 0 refills | Status: DC | PRN

## 2023-01-14 NOTE — TOC Transition Note (Signed)
Transition of Care [facility]) - CM/SW Discharge Note   Patient Details  Name: Tabitha Black MRN: [id number] Date of Birth: 1932-10-22  Transition of Care [facility]) CM/SW Contact:  Erin Sons, LCSW Phone Number: 01/14/2023, 2:54 PM   Clinical Narrative:     Patient will DC to: [facility] [facility] Anticipated DC date: 01/14/23 Family notified: Daughter Renea Transport by: Sharin Mons   Per MD patient ready for DC to [facility]. RN, patient, patient's family, and facility notified of DC. Discharge Summary and FL2 sent to facility. RN to call report prior to discharge [phone] ). DC packet on chart. Ambulance transport requested for patient.   CSW will sign off for now as social work intervention is no longer needed. Please consult [place] again if new needs arise.   Final next level of care: Skilled Nursing Facility Barriers to Discharge: No Barriers Identified   Patient Goals and CMS Choice      Discharge Placement                Patient chooses bed at: [facility] [place] Patient to be transferred to facility by: [facility] Name of family member notified: daughter Renea Patient and family notified of of transfer: 01/14/23  Discharge Plan and Services Additional resources added to the After Visit Summary for                              [facility] Agency: [facility] Date [facility] Agency Contacted: 01/11/23 Time HH Agency Contacted: 1626 Representative spoke with at [facility] Agency: Haywood Lasso  Social Determinants of Health (SDOH) Interventions SDOH Screenings   Food Insecurity: No Food Insecurity (11/12/2022)  Housing: Low Risk  (11/12/2022)  Transportation Needs: No Transportation Needs (11/12/2022)  Utilities: Not At Risk (11/12/2022)  Tobacco Use: Low Risk  (01/13/2023)     Readmission Risk Interventions    11/16/2022    3:25 PM  Readmission Risk Prevention Plan  Transportation Screening Complete  PCP or Specialist Appt within 3-5 Days Complete  HRI or Home Care Consult Complete   Palliative Care Screening Not Applicable  Medication Review (RN Care Manager) Complete

## 2023-01-14 NOTE — Progress Notes (Signed)
2 Days Post-Op  Subjective: Moving her bowels multiple times.  Eating some.  Pain with movement as expected.  Getting up to bedside commode while I was present  Objective: Vital signs in last 24 hours: Temp:  [97.5 F (36.4 C)-98.1 F (36.7 C)] 98.1 F (36.7 C) (06/28 0757) Pulse Rate:  [71-83] 83 (06/28 0757) Resp:  [16-17] 16 (06/28 0757) BP: (125-146)/(56-66) 146/66 (06/28 0757) SpO2:  [98 %-100 %] 98 % (06/28 0757) Weight:  [72.4 kg] 72.4 kg (06/28 0500)    Intake/Output from previous day: No intake/output data recorded. Intake/Output this shift: No intake/output data recorded.  PE: Gen: NAD Abd: soft,appropriately tender, +BS, ND, incisions c/d/I   Lab Results:  Recent Labs    01/13/23 0440 01/14/23 0312  WBC 4.5 5.1  HGB 9.0* 9.1*  HCT 28.2* 28.7*  PLT 161 159   BMET Recent Labs    01/13/23 0440 01/14/23 0312  NA 137 138  K 3.5 4.0  CL 99 101  CO2 26 23  GLUCOSE 126* 104*  BUN 94* 86*  CREATININE 3.62* 3.41*  CALCIUM 9.1 9.1   PT/INR No results for input(s): "LABPROT", "INR" in the last 72 hours. CMP     Component Value Date/Time   NA 138 01/14/2023 0312   NA 141 06/08/2022 1619   K 4.0 01/14/2023 0312   CL 101 01/14/2023 0312   CO2 23 01/14/2023 0312   GLUCOSE 104 (H) 01/14/2023 0312   BUN 86 (H) 01/14/2023 0312   BUN 44 (H) 06/08/2022 1619   CREATININE 3.41 (H) 01/14/2023 0312   CALCIUM 9.1 01/14/2023 0312   PROT 5.6 (L) 01/14/2023 0312   ALBUMIN 2.7 (L) 01/14/2023 0312   AST 26 01/14/2023 0312   ALT 14 01/14/2023 0312   ALKPHOS 30 (L) 01/14/2023 0312   BILITOT 0.8 01/14/2023 0312   GFRNONAA 13 (L) 01/14/2023 0312   GFRAA 22 (L) 07/25/2020 1126   Lipase     Component Value Date/Time   LIPASE 44 01/10/2023 0639       Studies/Results: No results found.  Anti-infectives: Anti-infectives (From admission, onward)    Start     Dose/Rate Route Frequency Ordered Stop   01/12/23 2200  cephALEXin (KEFLEX) capsule 500 mg         500 mg Oral 2 times daily 01/12/23 1800 01/16/23 2359   01/12/23 1019  sodium chloride 0.9 % with cefTRIAXone (ROCEPHIN) ADS Med       Note to Pharmacy: Susy [facility] L: cabinet override      01/12/23 1019 01/12/23 1047   01/12/23 0600  cefTRIAXone (ROCEPHIN) 2 g in sodium chloride 0.9 % 100 mL IVPB        2 g 200 mL/hr over 30 Minutes Intravenous On call to O.R. 01/11/23 1045 01/12/23 1042   01/10/23 0945  cefTRIAXone (ROCEPHIN) 1 g in sodium chloride 0.9 % 100 mL IVPB        1 g 200 mL/hr over 30 Minutes Intravenous  Once 01/10/23 1610 01/10/23 1114        Assessment/Plan POD 2, s/p robotic assisted RLQ incisional hernia repair with mesh, Dr. Derrell Lolling 6/26 -Eliquis resumed today -regular diet -mobilize with therapies.  Plan for SNF -multi-modal pain control -surgically stable for DC to SNF when bed available  -follow up in place   FEN - regular diet/IVFs VTE - eliquis  ID - Rocephin on call to OR, Keflex for LE cellulitis  CKD chronic lymphedema atrial fibrillation type II DM  GERD Hyperlipidemia HTN OA    LOS: 4 days    Letha Cape , [facility] Surgery 01/14/2023, 9:21 AM Please see Amion for pager number during day hours 7:00am-4:30pm or 7:00am -11:30am on weekends

## 2023-01-14 NOTE — Plan of Care (Signed)
Problem: Education: Goal: Knowledge of disease or condition will improve 01/14/2023 1423 by Letta Moynahan, RN Outcome: Adequate for Discharge 01/14/2023 1401 by Letta Moynahan, RN Outcome: Progressing Goal: Understanding of medication regimen will improve 01/14/2023 1423 by Letta Moynahan, RN Outcome: Adequate for Discharge 01/14/2023 1401 by Letta Moynahan, RN Outcome: Progressing Goal: Individualized Educational Video(s) 01/14/2023 1423 by Letta Moynahan, RN Outcome: Adequate for Discharge 01/14/2023 1401 by Letta Moynahan, RN Outcome: Progressing   Problem: Activity: Goal: Ability to tolerate increased activity will improve 01/14/2023 1423 by Letta Moynahan, RN Outcome: Adequate for Discharge 01/14/2023 1401 by Letta Moynahan, RN Outcome: Progressing   Problem: Cardiac: Goal: Ability to achieve and maintain adequate cardiopulmonary perfusion will improve 01/14/2023 1423 by Letta Moynahan, RN Outcome: Adequate for Discharge 01/14/2023 1401 by Letta Moynahan, RN Outcome: Progressing   Problem: Health Behavior/Discharge Planning: Goal: Ability to safely manage health-related needs after discharge will improve 01/14/2023 1423 by Letta Moynahan, RN Outcome: Adequate for Discharge 01/14/2023 1401 by Letta Moynahan, RN Outcome: Progressing   Problem: Education: Goal: Knowledge of General Education information will improve Description: Including pain rating scale, medication(s)/side effects and non-pharmacologic comfort measures 01/14/2023 1423 by Letta Moynahan, RN Outcome: Adequate for Discharge 01/14/2023 1401 by Letta Moynahan, RN Outcome: Progressing   Problem: Health Behavior/Discharge Planning: Goal: Ability to manage health-related needs will improve 01/14/2023 1423 by Letta Moynahan, RN Outcome: Adequate for Discharge 01/14/2023 1401 by Letta Moynahan, RN Outcome: Progressing   Problem: Clinical Measurements: Goal: Ability to maintain clinical measurements within normal  limits will improve 01/14/2023 1423 by Letta Moynahan, RN Outcome: Adequate for Discharge 01/14/2023 1401 by Letta Moynahan, RN Outcome: Progressing Goal: Will remain free from infection 01/14/2023 1423 by Letta Moynahan, RN Outcome: Adequate for Discharge 01/14/2023 1401 by Letta Moynahan, RN Outcome: Progressing Goal: Diagnostic test results will improve 01/14/2023 1423 by Letta Moynahan, RN Outcome: Adequate for Discharge 01/14/2023 1401 by Letta Moynahan, RN Outcome: Progressing Goal: Respiratory complications will improve 01/14/2023 1423 by Letta Moynahan, RN Outcome: Adequate for Discharge 01/14/2023 1401 by Letta Moynahan, RN Outcome: Progressing Goal: Cardiovascular complication will be avoided 01/14/2023 1423 by Letta Moynahan, RN Outcome: Adequate for Discharge 01/14/2023 1401 by Letta Moynahan, RN Outcome: Progressing   Problem: Activity: Goal: Risk for activity intolerance will decrease 01/14/2023 1423 by Letta Moynahan, RN Outcome: Adequate for Discharge 01/14/2023 1401 by Letta Moynahan, RN Outcome: Progressing   Problem: Nutrition: Goal: Adequate nutrition will be maintained 01/14/2023 1423 by Letta Moynahan, RN Outcome: Adequate for Discharge 01/14/2023 1401 by Letta Moynahan, RN Outcome: Progressing   Problem: Coping: Goal: Level of anxiety will decrease 01/14/2023 1423 by Letta Moynahan, RN Outcome: Adequate for Discharge 01/14/2023 1401 by Letta Moynahan, RN Outcome: Progressing   Problem: Elimination: Goal: Will not experience complications related to bowel motility 01/14/2023 1423 by Letta Moynahan, RN Outcome: Adequate for Discharge 01/14/2023 1401 by Letta Moynahan, RN Outcome: Progressing Goal: Will not experience complications related to urinary retention 01/14/2023 1423 by Letta Moynahan, RN Outcome: Adequate for Discharge 01/14/2023 1401 by Letta Moynahan, RN Outcome: Progressing   Problem: Pain Managment: Goal: General experience of comfort will  improve 01/14/2023 1423 by Letta Moynahan, RN Outcome: Adequate for Discharge 01/14/2023 1401 by Letta Moynahan, RN Outcome: Progressing   Problem: Safety: Goal: Ability to remain free from injury will  improve 01/14/2023 1423 by Letta Moynahan, RN Outcome: Adequate for Discharge 01/14/2023 1401 by Letta Moynahan, RN Outcome: Progressing   Problem: Skin Integrity: Goal: Risk for impaired skin integrity will decrease 01/14/2023 1423 by Letta Moynahan, RN Outcome: Adequate for Discharge 01/14/2023 1401 by Letta Moynahan, RN Outcome: Progressing

## 2023-01-14 NOTE — Discharge Summary (Signed)
Physician Discharge Summary   Patient: Tabitha Black MRN: [id number] DOB: 24-Dec-1932  Admit date:     01/10/2023  Discharge date: 01/14/23  Discharge Physician: Merlene Laughter   PCP: Irven Coe, MD   Recommendations at discharge:   Follow-up with PCP within 1 to 2 weeks and repeat CBC, CMP, mag, Phos within 1 week Follow-up with nephrology in outpatient setting and resume diuretics on Monday with 60 mg p.o. twice daily Follow-up with General Surgery in outpatient setting Follow-up with Cardiology in outpatient setting  Discharge Diagnoses: Principal Problem:   Irreducible Spigelian hernia Active Problems:   (HFpEF) heart failure with preserved ejection fraction (HCC)   Chronic kidney disease (CKD), active medical management without dialysis, stage 5 (HCC)   HTN (hypertension)   Persistent atrial fibrillation (HCC)   Lymphedema of both lower extremities - R > L   Abdominal pain  Resolved Problems:   * No resolved hospital problems. 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  A right-sided hernia is not reducible and CT of the abdomen pelvis was done and revealed the right sided Spigelian manage contained a portion of the terminal ileum, cecum and proximal ascending colon without bowel obstruction.  She was noted to be hemodynamically stable and creatinine was around her baseline with an elevated BUN of 110.  General surgery was consulted in the plan for surgical intervention on 01/12/2023 and cardiology was consulted for cardiac  clearance as well as nephrology being consulted for AKI on CKD and azotemia.  She is now out of the operating room and having some abdominal discomfort.  Her left foot remains erythematous and warm and she is being treated for outpatient cellulitis which will continue antibiotics now to complete 3 more days. She is improving and PT/OT recommending SNF.   Will resume her anticoagulation with apixaban on 01/14/2023.  Assessment and Plan:  Abdominal pain due to irreducible spigelian hernia post robotic assisted right lower quadrant incisional hernia repair with mesh postoperative day 1 by Dr. Derrell Lolling -Patient with known history of abdominal hernia.  -Previously reducible.  Presents with significant pain.  No evidence of obstruction.  Passing flatus.  No bowel movement since admission.  No nausea or vomiting. -General surgery following-plan for laparoscopic assisted incisional hernia repair on 6/26. -Cardiology has been consulted for cardiac clearance  -N.p.o. after midnight further diet advancement per general surgery now and then advance her to a regular diet -PT/OT recommending SNF -General surgery recommending multimodal pain control feel that she is surgically stable but needs mobilization assessment prior to discharge. -Surgery feels that we can resume her anticoagulation with Eliquis on 01/06/2023   Chronic Diastolic CHF -Appears euvolemic except for chronic BLE lymphedema.  TTE in 2018 with LVEF of 60 to 65%, no RWMA but G2 DD.  On Lasix and metolazone at home.   -Converted to IV Lasix on admission and she was continued IV Lasix 80 mg twice daily but held by Nephrology recommending to continue to hold and recommending spot diuresis preferred and no need for Lasix today -Cardiology consulted for preoperative clearance and she had an increased moderate  risk -Strict intake and output, daily weights, renal functions and electrolytes  Intake/Output Summary (Last 24 hours) at 01/13/2023 1807 Last  data filed at 01/12/2023 2030 Gross per 24 hour  Intake --  Output 250 ml  Net -250 ml  -She has not had any heart failure overtly postoperatively and cardiology feels that she is okay and recommending following up in outpatient setting with her regular scheduled appointment on 02/23/2023 with Tereso Newcomer recommending reconsulting if necessary  Bilateral Lower Extremity Lymphedema with concern for Right Foot Cellulitis, improving  -Resume Abx for Right Foot Cellulitis and will continue for 3 mor days -Diuretics as above being held for now   CKD-V/Azotemia Elevated Anion Gap -Not uremic.  Followed by Dr. Allena Katz. Recent Labs  Lab 01/04/23 1346 01/10/23 0639 01/10/23 0651 01/11/23 0046 01/12/23 0011 01/12/23 0659 01/13/23 0440  BUN 97* 104* 110* 100* 101* 99* 94*  CREATININE 3.90* 3.89* 4.00* 3.89* 4.16* 4.09* 3.62*  -Nephrology consulted and will see patient -Monitor daily   Bone Mineral Disorder -Has mild hyperphosphatemia. -May need Phos binder but defer to nephrology   Anemia of Chronic Disease -Hgb/Hct Trend: Recent Labs  Lab 01/04/23 1242 01/04/23 1346 01/10/23 0639 01/10/23 0651 01/11/23 0046 01/12/23 0011 01/13/23 0440  HGB 9.8* 10.0* 9.9* 11.6* 9.1* 9.4* 9.0*  HCT  --  31.8* 30.7* 34.0* 28.2* 29.1* 28.2*  MCV  --  93.8 96.2  --  94.9 92.1 94.9  -Check anemia panel in the morning -ESA per nephrology if deemed appropriate -Continue to monitor for S/Sx of Bleeding; No overt bleeding noted -Repeat CBC in the AM   Persistent atrial fibrillation -On Eliquis at home.  Rate controlled. -Continue metoprolol -IV heparin for anticoagulation.  Eliquis on hold for surgery and per surgery resume on 01/14/2023 -Optimize electrolytes   Hypertension -Normotensive for most part -Continue current regimen -Continue to Monitor BP per Protocol   Hypomagnesemia -Patient's Mag Level Trend: Recent Labs  Lab 01/10/23 1339 01/11/23 0046 01/12/23 0011 01/13/23 0440  MG  1.4* 1.4* 1.7 1.8  -Continue to Monitor and Replete as Necessary -Repeat Mag in the AM   Generalized weakness/Physical Deconditioning -PT/OT to further evaluate and Treat and recommending SNF  Hyperbilirubinemia -T Bili Trend: Recent Labs  Lab 01/04/23 1346 01/10/23 0639 01/12/23 0659 01/13/23 0440  BILITOT 1.1 0.8 1.4* 0.6  -Continue to Monitor and Trend and repeat CMP in the AM  Hypoalbuminemia -Patient's Albumin Trend: Recent Labs  Lab 01/04/23 1346 01/10/23 0639 01/11/23 0046 01/12/23 0011 01/12/23 0659 01/13/23 0440  ALBUMIN 4.2 3.5 2.9* 3.0* 3.1* 2.6*  -Continue to Monitor and Trend and repeat CMP in the AM  Consultants: GI, cardiology, general surgery Procedures performed: s/p robotic assisted RLQ incisional hernia repair with mesh, Dr. Derrell Lolling 6/26   Disposition: Skilled nursing facility Diet recommendation:  Discharge Diet Orders (From admission, onward)     Start     Ordered   01/14/23 0000  Diet - low sodium heart healthy       Comments: Renal Diet with 1200 mL Fluid Restriction   01/14/23 1338           Renal diet DISCHARGE MEDICATION: Allergies as of 01/14/2023       Reactions   Other Swelling   Topical mycin, unsure of which one, caused opthalmic swelling Erythromycin    Penicillins Shortness Of Breath, Itching, Swelling, Rash   Lovastatin Other (See Comments)   Weakness and myalgias, maybe to statins?   Azithromycin Other (See Comments)   Patient  doesn't remember having a reaction to this.   Clindamycin Other (See Comments)   Patient doesn't remember having a reaction to this.   Hydralazine Other (See Comments)   Felt foggy   Lisinopril Other (See Comments)   Hyperkalemia   Neomycin Sulfate [neomycin] Other (See Comments)   Redness and burning in face   Niacin Hives   Facial swelling and redness   Rofecoxib Other (See Comments)   Unknown reaction   Tetracycline    Valsartan Other (See Comments)   Hyperkalemia   Vytorin  [ezetimibe-simvastatin] Other (See Comments)   myalgias   Zetia [ezetimibe] Other (See Comments)   Leg pain        Medication List     STOP taking these medications    HYDROcodone-acetaminophen 10-325 MG tablet Commonly known as: NORCO   metolazone 5 MG tablet Commonly known as: ZAROXOLYN   NIFEdipine 30 MG 24 hr tablet Commonly known as: PROCARDIA-XL/NIFEDICAL-XL       TAKE these medications    acetaminophen 500 MG tablet Commonly known as: TYLENOL Take 2 tablets (1,000 mg total) by mouth every 6 (six) hours. What changed:  medication strength how much to take when to take this reasons to take this   apixaban 2.5 MG Tabs tablet Commonly known as: ELIQUIS Take 1 tablet (2.5 mg total) by mouth 2 (two) times daily.   cephALEXin 500 MG capsule Commonly known as: KEFLEX Take 1 capsule (500 mg total) by mouth 2 (two) times daily.   doxazosin 4 MG tablet Commonly known as: CARDURA Take 4 mg by mouth daily.   furosemide 20 MG tablet Commonly known as: LASIX Take 3 tablets (60 mg total) by mouth 2 (two) times daily. Takes 120 mg in the morning and 80 mg at night Start taking on: January 17, 2023 What changed:  medication strength how much to take These instructions start on January 17, 2023. If you are unsure what to do until then, ask your doctor or other care provider.   Hair Skin and Nails Formula Tabs Take 1 tablet by mouth daily.   isosorbide mononitrate 30 MG 24 hr tablet Commonly known as: IMDUR Take 1/2 tablet (15 mg) by mouth at bedtime.   methocarbamol 500 MG tablet Commonly known as: ROBAXIN Take 1 tablet (500 mg total) by mouth every 8 (eight) hours as needed for muscle spasms.   metoprolol succinate 50 MG 24 hr tablet Commonly known as: TOPROL-XL Take 1 tablet (50 mg total) by mouth every evening. Take with or immediately following a meal.   mupirocin ointment 2 % Commonly known as: BACTROBAN Apply topically 2 (two) times daily.   ondansetron 4  MG disintegrating tablet Commonly known as: ZOFRAN-ODT Take 1 tablet (4 mg total) by mouth every 6 (six) hours as needed for nausea.   oxyCODONE 5 MG immediate release tablet Commonly known as: Oxy IR/ROXICODONE Take 1 tablet (5 mg total) by mouth every 4 (four) hours as needed for moderate pain.   pantoprazole 40 MG tablet Commonly known as: PROTONIX Take 40 mg by mouth daily.   potassium chloride SA 20 MEQ tablet Commonly known as: KLOR-CON M Take 1 tablet (20 mEq) by mouth daily.   Vitamin D3 125 MCG (5000 UT) Tabs Take 5,000 Units by mouth daily.        Follow-up Information     Axel Filler, MD Follow up on 02/04/2023.   Specialty: General Surgery Why: 9:10am, Arrive 30 minutes prior to your appointment time, Please bring your  insurance card and photo ID Contact information: [address] [place] [address] [phone]                Discharge Exam: Ceasar Mons Weights   01/12/23 0346 01/12/23 0904 01/14/23 0500  Weight: 72.5 kg 72.5 kg 72.4 kg   Vitals:   01/14/23 0628 01/14/23 0757  BP: (!) 125/56 (!) 146/66  Pulse: 80 83  Resp: 16 16  Temp: 98 F (36.7 C) 98.1 F (36.7 C)  SpO2: 99% 98%   Examination: Physical Exam:  Constitutional: WN/WD overweight Caucasian female in no acute distress appears calm Respiratory: Diminished to auscultation bilaterally, no wheezing, rales, rhonchi or crackles. Normal respiratory effort and patient is not tachypenic. No accessory muscle use.  Unlabored breathing Cardiovascular: RRR, no murmurs / rubs / gallops. S1 and S2 auscultated.  Has mild lower extremity edema Abdomen: Soft, little tender, distended secondary to body habitus. Bowel sounds positive.  GU: Deferred. Musculoskeletal: No clubbing / cyanosis of digits/nails. .  Skin: No rashes, lesions, ulcers on limited skin evaluation. No induration; Warm and dry.  Neurologic: CN 2-12 grossly intact with no focal deficits. Romberg sign and  cerebellar reflexes not assessed.  Psychiatric: Normal judgment and insight. Alert and oriented x 3. Normal mood and appropriate affect.   Condition at discharge: stable  The results of significant diagnostics from this hospitalization (including imaging, microbiology, ancillary and laboratory) are listed below for reference.   Imaging Studies: ECHOCARDIOGRAM COMPLETE  Result Date: 01/11/2023    ECHOCARDIOGRAM REPORT   Patient Name:   Tabitha Black Date of Exam: 01/11/2023 Medical Rec #:  [id number]     Height:       63.0 in Accession #:    [id number]    Weight:       155.0 lb Date of Birth:  1932/09/20     BSA:          1.735 m Patient Age:    89 years      BP:           139/52 mmHg Patient Gender: F             HR:           82 bpm. Exam Location:  Inpatient Procedure: 2D Echo, Cardiac Doppler and Color Doppler Indications:    Dyspnea R06.00  History:        Patient has no prior history of Echocardiogram examinations.                 Arrythmias:Atrial Fibrillation; Risk Factors:Hypertension,                 Diabetes and Dyslipidemia. CKD, stage 5.  Sonographer:    Lucendia Herrlich Referring Phys: [phone] HAO MENG IMPRESSIONS  1. Left ventricular ejection fraction, by estimation, is 60 to 65%. The left ventricle has normal function. The left ventricle has no regional wall motion abnormalities. Left ventricular diastolic parameters are indeterminate.  2. Right ventricular systolic function is mildly reduced. The right ventricular size is severely enlarged. There is severely elevated pulmonary artery systolic pressure.  3. Left atrial size was severely dilated.  4. Right atrial size was severely dilated.  5. The mitral valve is normal in structure. Mild to moderate mitral valve regurgitation. No evidence of mitral stenosis.  6. Tricuspid valve regurgitation is moderate to severe.  7. The aortic valve is normal in structure. Aortic valve regurgitation is not visualized. Aortic valve sclerosis is present, with  no evidence of aortic valve stenosis.  8. The inferior vena cava is dilated in size with <50% respiratory variability, suggesting right atrial pressure of 15 mmHg. FINDINGS  Left Ventricle: Left ventricular ejection fraction, by estimation, is 60 to 65%. The left ventricle has normal function. The left ventricle has no regional wall motion abnormalities. The left ventricular internal cavity size was normal in size. There is  no left ventricular hypertrophy. Left ventricular diastolic parameters are indeterminate. Right Ventricle: The right ventricular size is severely enlarged. No increase in right ventricular wall thickness. Right ventricular systolic function is mildly reduced. There is severely elevated pulmonary artery systolic pressure. The tricuspid regurgitant velocity is 3.60 m/s, and with an assumed right atrial pressure of 15 mmHg, the estimated right ventricular systolic pressure is 66.8 mmHg. Left Atrium: Left atrial size was severely dilated. Right Atrium: Right atrial size was severely dilated. Pericardium: There is no evidence of pericardial effusion. Mitral Valve: The mitral valve is normal in structure. Mild to moderate mitral valve regurgitation. No evidence of mitral valve stenosis. Tricuspid Valve: The tricuspid valve is normal in structure. Tricuspid valve regurgitation is moderate to severe. No evidence of tricuspid stenosis. Aortic Valve: The aortic valve is normal in structure. Aortic valve regurgitation is not visualized. Aortic valve sclerosis is present, with no evidence of aortic valve stenosis. Aortic valve mean gradient measures 7.0 mmHg. Aortic valve peak gradient measures 12.5 mmHg. Aortic valve area, by VTI measures 1.71 cm. Pulmonic Valve: The pulmonic valve was normal in structure. Pulmonic valve regurgitation is trivial. No evidence of pulmonic stenosis. Aorta: The aortic root is normal in size and structure. Venous: The inferior vena cava is dilated in size with less than 50%  respiratory variability, suggesting right atrial pressure of 15 mmHg. IAS/Shunts: No atrial level shunt detected by color flow Doppler.  LEFT VENTRICLE PLAX 2D LVIDd:         4.40 cm   Diastology LVIDs:         2.50 cm   LV e' medial:    7.10 cm/s LV PW:         0.90 cm   LV E/e' medial:  14.1 LV IVS:        0.90 cm   LV e' lateral:   12.20 cm/s LVOT diam:     1.80 cm   LV E/e' lateral: 8.2 LV SV:         54 LV SV Index:   31 LVOT Area:     2.54 cm  RIGHT VENTRICLE             IVC RV S prime:     14.30 cm/s  IVC diam: 2.80 cm TAPSE (M-mode): 1.5 cm LEFT ATRIUM              Index        RIGHT ATRIUM           Index LA diam:        4.90 cm  2.82 cm/m   RA Area:     30.70 cm LA Vol (A2C):   99.0 ml  57.06 ml/m  RA Volume:   105.80 ml 60.98 ml/m LA Vol (A4C):   111.0 ml 63.97 ml/m LA Biplane Vol: 114.0 ml 65.70 ml/m  AORTIC VALVE AV Area (Vmax):    1.84 cm AV Area (Vmean):   1.74 cm AV Area (VTI):     1.71 cm AV Vmax:           177.00  cm/s AV Vmean:          116.000 cm/s AV VTI:            0.318 m AV Peak Grad:      12.5 mmHg AV Mean Grad:      7.0 mmHg LVOT Vmax:         127.80 cm/s LVOT Vmean:        79.120 cm/s LVOT VTI:          0.213 m LVOT/AV VTI ratio: 0.67  AORTA Ao Root diam: 2.90 cm Ao Asc diam:  2.90 cm MITRAL VALVE               TRICUSPID VALVE MV Area (PHT): 4.21 cm    TR Peak grad:   51.8 mmHg MV Decel Time: 180 msec    TR Vmax:        360.00 cm/s MR Peak grad: 63.4 mmHg MR Vmax:      398.00 cm/s  SHUNTS MV E velocity: 99.80 cm/s  Systemic VTI:  0.21 m MV A velocity: 39.30 cm/s  Systemic Diam: 1.80 cm MV E/A ratio:  2.54 Kardie Tobb DO Electronically signed by Thomasene Ripple DO Signature Date/Time: 01/11/2023/8:09:24 PM    Final    CT ABDOMEN PELVIS WO CONTRAST  Result Date: 01/10/2023 CLINICAL DATA:  89 year old female with history of right lower quadrant abdominal pain and mass. EXAM: CT ABDOMEN AND PELVIS WITHOUT CONTRAST TECHNIQUE: Multidetector CT imaging of the abdomen and pelvis was  performed following the standard protocol without IV contrast. RADIATION DOSE REDUCTION: This exam was performed according to the departmental dose-optimization program which includes automated exposure control, adjustment of the mA and/or kV according to patient size and/or use of iterative reconstruction technique. COMPARISON:  CT of the abdomen and pelvis 11/12/2022. FINDINGS: Lower chest: Cardiomegaly. Atherosclerotic calcifications in the descending thoracic aorta. Calcifications of the mitral annulus. Hepatobiliary: Status post cholecystectomy. No definite suspicious cystic or solid hepatic lesions are confidently identified on today's noncontrast CT examination. Pancreas: No definite pancreatic mass or peripancreatic fluid collections or inflammatory changes are noted on today's noncontrast CT examination. Spleen: Unremarkable. Adrenals/Urinary Tract: Low position and altered axis of the right kidney, similar to the prior study (normal variant). Low-attenuation lesions in both kidneys, incompletely characterized on today's noncontrast CT examination, but similar to the prior study and likely to represent cysts (no imaging follow-up recommended), measuring up to 3 cm in the medial aspect of the left kidney. No hydroureteronephrosis. Small diverticulum measuring 2.7 cm in diameter extending off the left side of the urinary bladder (axial image 57 of series 3), similar to the prior study. Urinary bladder is otherwise unremarkable in appearance. Bilateral adrenal glands are normal in appearance. Stomach/Bowel: The unenhanced appearance of the stomach is normal. No pathologic dilatation of small bowel or colon. Right-sided Spigelian hernia containing the terminal ileum, cecum and proximal ascending colon. The appendix is not confidently identified and may be surgically absent. Regardless, there are no inflammatory changes noted adjacent to the cecum to suggest the presence of an acute appendicitis at this time.  Numerous colonic diverticula are noted, without surrounding inflammatory changes to indicate an acute diverticulitis at this time. Vascular/Lymphatic: Atherosclerosis in the abdominal aorta and pelvic vasculature. No lymphadenopathy noted in the abdomen or pelvis. Reproductive: Status post hysterectomy. Ovaries are not confidently identified may be surgically absent or atrophic. Other: No significant volume of ascites.  No pneumoperitoneum. Musculoskeletal: There are no aggressive appearing lytic or blastic lesions noted in  the visualized portions of the skeleton. Spinal fixation hardware in the lumbar spine incidentally noted. IMPRESSION: 1. Right-sided Spigelian hernia containing a portion of the terminal ileum, cecum and proximal ascending colon. No findings to suggest associated bowel obstruction at this time. This likely accounts for the palpable abdominal mass. 2. Colonic diverticulosis without evidence of acute diverticulitis at this time. 3. Aortic atherosclerosis. 4. Cardiomegaly. 5. Additional incidental findings, as above. Electronically Signed   By: Trudie Reed M.D.   On: 01/10/2023 07:26   DG Foot Complete Right  Result Date: 01/04/2023 CLINICAL DATA:  Swelling and discomfort for a week. History of lymphedema. EXAM: RIGHT FOOT COMPLETE - 3 VIEW; RIGHT ANKLE - COMPLETE 3 VIEW COMPARISON:  None Available. FINDINGS: Extensive swelling about the foot and ankle. Small well corticated plantar greater than Achilles calcaneal spurs. Underlying osteopenia. Slight degenerative changes along the interphalangeal joints. Mild degenerative changes of the hindfoot as well. No fracture or dislocation. No definite erosive changes. If there is further concern of potential infection, MRI or bone scan may be useful for further sensitivity as clinically appropriate. IMPRESSION: Severe soft tissue swelling. Osteopenia.  Mild degenerative changes.  Calcaneal spur Electronically Signed   By: Karen Kays M.D.   On:  01/04/2023 16:57   DG Ankle Complete Right  Result Date: 01/04/2023 CLINICAL DATA:  Swelling and discomfort for a week. History of lymphedema. EXAM: RIGHT FOOT COMPLETE - 3 VIEW; RIGHT ANKLE - COMPLETE 3 VIEW COMPARISON:  None Available. FINDINGS: Extensive swelling about the foot and ankle. Small well corticated plantar greater than Achilles calcaneal spurs. Underlying osteopenia. Slight degenerative changes along the interphalangeal joints. Mild degenerative changes of the hindfoot as well. No fracture or dislocation. No definite erosive changes. If there is further concern of potential infection, MRI or bone scan may be useful for further sensitivity as clinically appropriate. IMPRESSION: Severe soft tissue swelling. Osteopenia.  Mild degenerative changes.  Calcaneal spur Electronically Signed   By: Karen Kays M.D.   On: 01/04/2023 16:57    Microbiology: Results for orders placed or performed during the hospital encounter of 11/12/22  Blood Culture (routine x 2)     Status: None   Collection Time: 11/12/22  4:31 PM   Specimen: BLOOD  Result Value Ref Range Status   Specimen Description BLOOD SITE NOT SPECIFIED  Final   Special Requests   Final    BOTTLES DRAWN AEROBIC AND ANAEROBIC Blood Culture results may not be optimal due to an inadequate volume of blood received in culture bottles   Culture   Final    NO GROWTH 5 DAYS Performed at [facility], [address] [place], [place] [address]    Report Status 11/17/2022 FINAL  Final  Blood Culture (routine x 2)     Status: None   Collection Time: 11/12/22  5:20 PM   Specimen: BLOOD RIGHT ARM  Result Value Ref Range Status   Specimen Description BLOOD RIGHT ARM  Final   Special Requests   Final    BOTTLES DRAWN AEROBIC AND ANAEROBIC Blood Culture adequate volume   Culture   Final    NO GROWTH 5 DAYS Performed at [facility], [address], [place], [place] [address]    Report Status 11/17/2022 FINAL  Final    Labs: CBC: Recent Labs  Lab 01/10/23 0639 01/10/23 0651 01/11/23 0046 01/12/23 0011 01/13/23 0440 01/14/23 0312  WBC 4.7  --  5.2 6.5 4.5 5.1  NEUTROABS 2.2  --   --   --  3.1 3.2  HGB 9.9* 11.6* 9.1* 9.4* 9.0* 9.1*  HCT 30.7* 34.0* 28.2* 29.1* 28.2* 28.7*  MCV 96.2  --  94.9 92.1 94.9 95.0  PLT 183  --  177 174 161 159   Basic Metabolic Panel: Recent Labs  Lab 01/10/23 1339 01/11/23 0046 01/12/23 0011 01/12/23 0659 01/13/23 0440 01/14/23 0312  NA  --  134* 135 136 137 138  K  --  3.7 3.5 3.7 3.5 4.0  CL  --  97* 93* 94* 99 101  CO2  --  25 25 23 26 23   GLUCOSE  --  96 114* 110* 126* 104*  BUN  --  100* 101* 99* 94* 86*  CREATININE  --  3.89* 4.16* 4.09* 3.62* 3.41*  CALCIUM  --  8.9 9.4 9.6 9.1 9.1  MG 1.4* 1.4* 1.7  --  1.8 1.7  PHOS 5.2* 5.4* 4.9*  --  6.2* 5.1*   Liver Function Tests: Recent Labs  Lab 01/10/23 0639 01/11/23 0046 01/12/23 0011 01/12/23 0659 01/13/23 0440 01/14/23 0312  AST 26  --   --  27 22 26   ALT 13  --   --  14 11 14   ALKPHOS 28*  --   --  27* 24* 30*  BILITOT 0.8  --   --  1.4* 0.6 0.8  PROT 6.7  --   --  6.4* 5.8* 5.6*  ALBUMIN 3.5 2.9* 3.0* 3.1* 2.6* 2.7*   CBG: Recent Labs  Lab 01/12/23 2151 01/13/23 0824 01/13/23 1147 01/13/23 1720 01/14/23 0802  GLUCAP 147* 136* 177* 124* 101*   Discharge time spent: greater than 30 minutes.  Signed: Marguerita Merles, DO [facility] 01/14/2023

## 2023-01-14 NOTE — Progress Notes (Signed)
Physical Therapy Treatment Patient Details Name: Tabitha Black MRN: [id number] DOB: [birth year] Today's Date: 01/14/2023   History of Present Illness 89 year old F presenting with intermittent abdominal pain for about 1 week that has become persistent the day of presentation.  Right-sided hernia was not reducible.  CT abdomen and pelvis revealed right-sided spigelian hernia containing a portion of terminal ileum, cecum and proximal ascending colon without bowel obstruction; with PMH of diastolic CHF, A-fib on Eliquis, CKD-5, HTN, HLD, multiple abdominal surgeries, right abdominal hernia and BLE lymphedema    PT Comments    Pt is up to walk today, in chair when PT arrived and got right up with help to walk in the room.  Instructed ROM to legs upon completion, and note her issues of stiffness and edema on R lower leg.  Will continue to see her for mobility and exercise before her discharge to rehab setting, and progress her to more independence as pt can allow.  Daughter who is a caregiver is also in attendance.  Noted blood spot on commode and bed, nursing made aware.   Recommendations for follow up therapy are one component of a multi-disciplinary discharge planning process, led by the attending physician.  Recommendations may be updated based on patient status, additional functional criteria and insurance authorization.  Follow Up Recommendations       Assistance Recommended at Discharge Frequent or constant Supervision/Assistance  Patient can return home with the following A lot of help with bathing/dressing/bathroom;Assistance with cooking/housework;Assist for transportation;Help with stairs or ramp for entrance;A little help with walking and/or transfers   Equipment Recommendations  None recommended by PT    Recommendations for Other Services       Precautions / Restrictions Precautions Precautions: Fall Precaution Comments: abd precautions Restrictions Weight Bearing Restrictions:  No     Mobility  Bed Mobility               General bed mobility comments: up in recliner    Transfers Overall transfer level: Needs assistance Equipment used: Rolling walker (2 wheels) Transfers: Sit to/from Stand Sit to Stand: Min assist           General transfer comment: able to assist standing but delays over waiting to see how abd is feeling    Ambulation/Gait Ambulation/Gait assistance: Min guard Gait Distance (Feet): 35 Feet Assistive device: Rolling walker (2 wheels) Gait Pattern/deviations: Step-to pattern, Decreased stride length, Shuffle, Wide base of support Gait velocity: reduced Gait velocity interpretation: <1.31 ft/sec, indicative of household ambulator   General Gait Details: guarded but safe   Stairs             Wheelchair Mobility    Modified Rankin (Stroke Patients Only)       Balance Overall balance assessment: Needs assistance   Sitting balance-Leahy Scale: Good     Standing balance support: Bilateral upper extremity supported, During functional activity Standing balance-Leahy Scale: Poor                              Cognition Arousal/Alertness: Awake/alert Behavior During Therapy: WFL for tasks assessed/performed Overall Cognitive Status: Within Functional Limits for tasks assessed                                          Exercises General Exercises - Lower Extremity Ankle Circles/Pumps: AROM, 10 reps  Long Arc Quad: AROM, AAROM, 10 reps Heel Slides: AROM, 10 reps Hip ABduction/ADduction: AROM, 10 reps    General Comments General comments (skin integrity, edema, etc.): pt is up to walk with RW and noted her motivation, tends to ask for more work and is able to understand instructions for safety and progressing mobility      Pertinent Vitals/Pain Pain Assessment Pain Assessment: Faces Faces Pain Scale: Hurts a little bit Pain Location: R lower abd surgery site, B feet Pain  Descriptors / Indicators: Guarding Pain Intervention(s): Monitored during session, Repositioned    Home Living                          Prior Function            PT Goals (current goals can now be found in the care plan section) Acute Rehab PT Goals Patient Stated Goal: get home Progress towards PT goals: Progressing toward goals    Frequency    Min 3X/week      PT Plan Current plan remains appropriate    Co-evaluation              AM-PAC PT "6 Clicks" Mobility   Outcome Measure  Help needed turning from your back to your side while in a flat bed without using bedrails?: A Little Help needed moving from lying on your back to sitting on the side of a flat bed without using bedrails?: A Little Help needed moving to and from a bed to a chair (including a wheelchair)?: A Little Help needed standing up from a chair using your arms (e.g., wheelchair or bedside chair)?: A Little Help needed to walk in hospital room?: A Little Help needed climbing 3-5 steps with a railing? : Total 6 Click Score: 16    End of Session Equipment Utilized During Treatment: Gait belt Activity Tolerance: Patient tolerated treatment well Patient left: in chair;with call bell/phone within reach;with family/visitor present Nurse Communication: Mobility status PT Visit Diagnosis: Unsteadiness on feet (R26.81);Other abnormalities of gait and mobility (R26.89);Muscle weakness (generalized) (M62.81);Difficulty in walking, not elsewhere classified (R26.2)     Time: 1610-9604 PT Time Calculation (min) (ACUTE ONLY): 24 min  Charges:  $Gait Training: 8-22 mins $Therapeutic Exercise: 8-22 mins         Ivar Drape 01/14/2023, 1:41 PM  Samul Dada, PT PhD Acute Rehab Dept. Number: [facility] [id number] and [facility] [phone]

## 2023-01-14 NOTE — Progress Notes (Signed)
Report called to [facility] at [facility].

## 2023-01-14 NOTE — Progress Notes (Signed)
[facility] Progress Note    Assessment/ Plan:   AKI on CKD4-5 -baseline Cr most recently has been in the mid 2's to 3's range. Underlying CKD presumed to be secondary to HTN and CRS -AKI likely secondary to fluctuating volume status. Cr down to 3.4, will continue to hold lasix for now -no obstruction on CT. UA bland -no indication for dialysis at this time, volume status is stable and not exhibiting any uremic symptoms -renal replacement therapy candidacy: patient, daughter, and I had an extensive conversation in regards to renal replacement therapy on 6/25. I did express my concerns in regards to her functional status and advanced age. Aafiya has been thinking about this for quite some time. I did discuss process of initiation and timing. I also discussed NOT doing dialysis as well. They have a lot to discuss and think about. Will continue to advance conversations if necessary. -Avoid nephrotoxic medications including NSAIDs and iodinated intravenous contrast exposure unless the latter is absolutely indicated.  Preferred narcotic agents for pain control are hydromorphone, fentanyl, and methadone. Morphine should not be used. Avoid Baclofen and avoid oral sodium phosphate and magnesium citrate based laxatives / bowel preps. Continue strict Input and Output monitoring. Will monitor the patient closely with you and intervene or adjust therapy as indicated by changes in clinical status/labs    RLQ Hernia -surgery following, s/p hernia repair with mesh 6/26   Chronic diastolic CHF Lymphedema -d/c'ed IV lasix. Monitor daily weights and strict I/O.Will reassess in regards to diuretic dosing (spot diuresis preferred for now, no need for lasix today) -can likely resume Lasix 60mg  PO BID by Monday if she is to be discharged today   HTN -BP currently acceptable   Anemia of CKD -receives ESA outpatient. Retacrit 10,000 units every 2 weeks, last dose 6/18. Hgb currently 9.1. Due for  ESA next Tues   Secondary hyperparathyroidism, CKD-MBD -renal diet for now, PO4 down to 5.1 after changing to renal diet, changed to renal diet  Subjective:   Patient seen and examined bedside. No acute events. Daughter at bedside. They report that will be discharged later today   Objective:   BP (!) 146/66 (BP Location: Left Arm)   Pulse 83   Temp 98.1 F (36.7 C) (Oral)   Resp 16   Ht 5\' 3"  (1.6 m)   Wt 72.4 kg   SpO2 98%   BMI 28.27 kg/m  No intake or output data in the 24 hours ending 01/14/23 1307  Weight change: -0.1 kg  Physical Exam: Gen:NAD, in chair CVS: RRR Resp: CTA bb/l Abd: ND Ext: lymphedema b/l Les, RLE tender to light touch Neuro: sleepy but arousable  Imaging: No results found.  Labs: BMET Recent Labs  Lab 01/10/23 [phone] 01/10/23 4696 01/10/23 1339 01/11/23 0046 01/12/23 0011 01/12/23 0659 01/13/23 0440 01/14/23 0312  NA 134* 137  --  134* 135 136 137 138  K 3.5 3.6  --  3.7 3.5 3.7 3.5 4.0  CL 96* 97*  --  97* 93* 94* 99 101  CO2 23  --   --  25 25 23 26 23   GLUCOSE 119* 118*  --  96 114* 110* 126* 104*  BUN 104* 110*  --  100* 101* 99* 94* 86*  CREATININE 3.89* 4.00*  --  3.89* 4.16* 4.09* 3.62* 3.41*  CALCIUM 9.2  --   --  8.9 9.4 9.6 9.1 9.1  PHOS  --   --  5.2* 5.4* 4.9*  --  6.2* 5.1*   CBC Recent Labs  Lab 01/10/23 0639 01/10/23 0651 01/11/23 0046 01/12/23 0011 01/13/23 0440 01/14/23 0312  WBC 4.7  --  5.2 6.5 4.5 5.1  NEUTROABS 2.2  --   --   --  3.1 3.2  HGB 9.9*   < > 9.1* 9.4* 9.0* 9.1*  HCT 30.7*   < > 28.2* 29.1* 28.2* 28.7*  MCV 96.2  --  94.9 92.1 94.9 95.0  PLT 183  --  177 174 161 159   < > = values in this interval not displayed.    Medications:     acetaminophen  1,000 mg Oral Q6H   apixaban  2.5 mg Oral BID   cephALEXin  500 mg Oral BID   isosorbide mononitrate  15 mg Oral QHS   metoprolol succinate  50 mg Oral QPM   pantoprazole  40 mg Oral Daily      Anthony Sar, MD [facility] 01/14/2023, 1:07 PM

## 2023-01-14 NOTE — Plan of Care (Signed)

## 2023-01-16 DIAGNOSIS — Z9889 Other specified postprocedural states: Secondary | ICD-10-CM | POA: Diagnosis not present

## 2023-01-16 DIAGNOSIS — K5900 Constipation, unspecified: Secondary | ICD-10-CM | POA: Diagnosis not present

## 2023-01-16 DIAGNOSIS — I872 Venous insufficiency (chronic) (peripheral): Secondary | ICD-10-CM | POA: Diagnosis not present

## 2023-01-16 DIAGNOSIS — K469 Unspecified abdominal hernia without obstruction or gangrene: Secondary | ICD-10-CM | POA: Diagnosis not present

## 2023-01-16 DIAGNOSIS — I5030 Unspecified diastolic (congestive) heart failure: Secondary | ICD-10-CM | POA: Diagnosis not present

## 2023-01-16 DIAGNOSIS — N184 Chronic kidney disease, stage 4 (severe): Secondary | ICD-10-CM | POA: Diagnosis not present

## 2023-01-16 DIAGNOSIS — I4891 Unspecified atrial fibrillation: Secondary | ICD-10-CM | POA: Diagnosis not present

## 2023-01-18 ENCOUNTER — Encounter (HOSPITAL_COMMUNITY)
Admission: RE | Admit: 2023-01-18 | Discharge: 2023-01-18 | Disposition: A | Discharge status: Home / Self Care | Payer: Medicare Other | Source: Ambulatory Visit | Attending: Nephrology | Admitting: Nephrology

## 2023-01-18 VITALS — BP 145/61 | HR 76 | Temp 97.7°F | Resp 19

## 2023-01-18 DIAGNOSIS — N185 Chronic kidney disease, stage 5: Secondary | ICD-10-CM | POA: Diagnosis not present

## 2023-01-18 LAB — CBC WITH DIFFERENTIAL/PLATELET
Abs Immature Granulocytes: 0.01 10*3/uL (ref 0.00–0.07)
Basophils Absolute: 0 10*3/uL (ref 0.0–0.1)
Basophils Relative: 1 %
Eosinophils Absolute: 0.7 10*3/uL — ABNORMAL HIGH (ref 0.0–0.5)
Eosinophils Relative: 16 %
HCT: 34.9 % — ABNORMAL LOW (ref 36.0–46.0)
Hemoglobin: 10.9 g/dL — ABNORMAL LOW (ref 12.0–15.0)
Immature Granulocytes: 0 %
Lymphocytes Relative: 19 %
Lymphs Abs: 0.8 10*3/uL (ref 0.7–4.0)
MCH: 31 pg (ref 26.0–34.0)
MCHC: 31.2 g/dL (ref 30.0–36.0)
MCV: 99.1 fL (ref 80.0–100.0)
Monocytes Absolute: 0.3 10*3/uL (ref 0.1–1.0)
Monocytes Relative: 7 %
Neutro Abs: 2.5 10*3/uL (ref 1.7–7.7)
Neutrophils Relative %: 57 %
Platelets: 200 10*3/uL (ref 150–400)
RBC: 3.52 MIL/uL — ABNORMAL LOW (ref 3.87–5.11)
RDW: 15.1 % (ref 11.5–15.5)
WBC: 4.3 10*3/uL (ref 4.0–10.5)
nRBC: 0 % (ref 0.0–0.2)

## 2023-01-18 LAB — IRON AND TIBC
Iron: 41 ug/dL (ref 28–170)
Saturation Ratios: 13 % (ref 10.4–31.8)
TIBC: 318 ug/dL (ref 250–450)
UIBC: 277 ug/dL

## 2023-01-18 LAB — FERRITIN: Ferritin: 586 ng/mL — ABNORMAL HIGH (ref 11–307)

## 2023-01-18 LAB — POCT HEMOGLOBIN-HEMACUE: Hemoglobin: 10.7 g/dL — ABNORMAL LOW (ref 12.0–15.0)

## 2023-01-18 MED ORDER — EPOETIN ALFA-EPBX 10000 UNIT/ML IJ SOLN
INTRAMUSCULAR | Status: AC
  Filled 2023-01-18: qty 1

## 2023-01-18 MED ORDER — EPOETIN ALFA-EPBX 10000 UNIT/ML IJ SOLN
10000.0000 [IU] | INTRAMUSCULAR | Status: DC
  Administered 2023-01-18: 10000 [IU] via SUBCUTANEOUS

## 2023-01-26 ENCOUNTER — Ambulatory Visit (INDEPENDENT_AMBULATORY_CARE_PROVIDER_SITE_OTHER): Payer: Medicare Other | Admitting: Podiatry

## 2023-01-26 ENCOUNTER — Encounter: Payer: Self-pay | Admitting: Podiatry

## 2023-01-26 DIAGNOSIS — B351 Tinea unguium: Secondary | ICD-10-CM | POA: Diagnosis not present

## 2023-01-26 DIAGNOSIS — E1121 Type 2 diabetes mellitus with diabetic nephropathy: Secondary | ICD-10-CM | POA: Diagnosis not present

## 2023-01-26 DIAGNOSIS — L97511 Non-pressure chronic ulcer of other part of right foot limited to breakdown of skin: Secondary | ICD-10-CM | POA: Diagnosis not present

## 2023-01-26 DIAGNOSIS — M79676 Pain in unspecified toe(s): Secondary | ICD-10-CM

## 2023-01-30 NOTE — Progress Notes (Signed)
Subjective:  Patient ID: Tabitha Black, female    DOB: 08-25-1932,  MRN: [id number]  FEY DORSI presents to clinic today for at risk foot care. Pt has h/o NIDDM with chronic kidney disease and painful elongated mycotic toenails 1-5 bilaterally which are tender when wearing enclosed shoe gear. Pain is relieved with periodic professional debridement. Patient is in [facility] from recent hospitalization. States she had hernia surgery. Chief Complaint  Patient presents with   Nail Problem    Routine foot care    PCP is Irven Coe, MD.  Allergies  Allergen Reactions   Other Swelling    Topical mycin, unsure of which one, caused opthalmic swelling  Erythromycin    Penicillins Shortness Of Breath, Itching, Swelling and Rash   Lovastatin Other (See Comments)    Weakness and myalgias, maybe to statins?   Azithromycin Other (See Comments)    Patient doesn't remember having a reaction to this.   Clindamycin Other (See Comments)    Patient doesn't remember having a reaction to this.    Hydralazine Other (See Comments)    Felt foggy   Lisinopril Other (See Comments)    Hyperkalemia    Neomycin Sulfate [Neomycin] Other (See Comments)    Redness and burning in face   Niacin Hives    Facial swelling and redness   Rofecoxib Other (See Comments)    Unknown reaction   Tetracycline    Valsartan Other (See Comments)    Hyperkalemia    Vytorin [Ezetimibe-Simvastatin] Other (See Comments)    myalgias   Zetia [Ezetimibe] Other (See Comments)    Leg pain    Review of Systems: Negative except as noted in the HPI.  Objective: No changes noted in today's physical examination. There were no vitals filed for this visit. Tabitha Black is a pleasant 89 y.o. female in NAD. AAO x 3.  Vascular Examination: Capillary refill time immediate b/l. Vascular status intact b/l with palpable pedal pulses. No pain with calf compression b/l. Skin temperature gradient WNL b/l. Lymphedema  present BLE. No ischemia or gangrene noted b/l LE. No cyanosis or clubbing noted b/l LE.  Neurological Examination: Sensation grossly intact b/l with 10 gram monofilament. Vibratory sensation intact b/l.   Dermatological Examination: Pedal skin with normal turgor, texture and tone b/l.  Dressing noted on RLE which is clean, dry and intact. No interdigital macerations.   Toenails 1-5 b/l thick, discolored, elongated with subungual debris and pain on dorsal palpation.    Pinpoint skin ulcer plantar aspect right great toe to level of dermis. No erythema, no edema, no drainage, no underlying fluctuance.  Musculoskeletal Examination: Muscle strength 5/5 to all lower extremity muscle groups bilaterally. Hammertoe deformity noted 2-5 b/l. Pes planus deformity noted bilateral LE.  Radiographs: None  Assessment/Plan: 1. Pain due to onychomycosis of toenail   2. Skin ulcer of great toe, right, limited to breakdown of skin (HCC)   3. Diabetic nephropathy associated with type 2 diabetes mellitus (HCC)     -Consent given for treatment as described below: -Examined patient. -Skin ulcer debrided to level of dermis. Cleansed with wound cleanser. TAO and light dressing applied. Order written for facility wound care nurse to cleanse right great toe with saline, apply Mupirocin Ointment and light dressing once daily until healed. -Facility to continue fall precautions and pressure precautions. -Toenails 1-5 b/l were debrided in length and girth with sterile nail nippers and dremel without iatrogenic bleeding.  -Patient/POA to call should there be  question/concern in the interim.   Return in about 3 months (around 04/28/2023).  Tabitha Black, DPM

## 2023-02-01 ENCOUNTER — Encounter (HOSPITAL_COMMUNITY)
Admission: RE | Admit: 2023-02-01 | Discharge: 2023-02-01 | Disposition: A | Discharge status: Home / Self Care | Payer: Medicare Other | Source: Ambulatory Visit | Attending: Nephrology | Admitting: Nephrology

## 2023-02-01 VITALS — BP 150/45 | HR 58 | Temp 97.1°F | Resp 18

## 2023-02-01 DIAGNOSIS — N185 Chronic kidney disease, stage 5: Secondary | ICD-10-CM | POA: Diagnosis not present

## 2023-02-01 LAB — POCT HEMOGLOBIN-HEMACUE: Hemoglobin: 9.8 g/dL — ABNORMAL LOW (ref 12.0–15.0)

## 2023-02-01 MED ORDER — EPOETIN ALFA-EPBX 10000 UNIT/ML IJ SOLN: 10000.0000 [IU] | INTRAMUSCULAR | Status: DC

## 2023-02-01 MED ORDER — EPOETIN ALFA-EPBX 10000 UNIT/ML IJ SOLN
INTRAMUSCULAR | Status: AC
  Administered 2023-02-01: 10000 [IU] via SUBCUTANEOUS
  Filled 2023-02-01: qty 1

## 2023-02-02 ENCOUNTER — Other Ambulatory Visit: Payer: Self-pay | Admitting: *Deleted

## 2023-02-02 NOTE — Patient Outreach (Signed)
Mrs. Froemming resides in [facility]. Screening for potential care coordination services as benefit of health plan and Primary Care Provider.   Update received from Jill Side, Programmer, systems. Transition plan is to return home with daughter's support. Will have home health with [facility] for continued PT/OT services.   PCP is with [facility].   Will continue to follow.   Raiford Noble, MSN, RN,BSN [facility] Post Acute Care Coordinator [phone] (Direct dial)

## 2023-02-06 DIAGNOSIS — R600 Localized edema: Secondary | ICD-10-CM | POA: Diagnosis not present

## 2023-02-06 DIAGNOSIS — N184 Chronic kidney disease, stage 4 (severe): Secondary | ICD-10-CM | POA: Diagnosis not present

## 2023-02-06 DIAGNOSIS — I4891 Unspecified atrial fibrillation: Secondary | ICD-10-CM | POA: Diagnosis not present

## 2023-02-06 DIAGNOSIS — R2681 Unsteadiness on feet: Secondary | ICD-10-CM | POA: Diagnosis not present

## 2023-02-10 ENCOUNTER — Other Ambulatory Visit: Payer: Self-pay | Admitting: *Deleted

## 2023-02-10 NOTE — Patient Outreach (Signed)
Per [facility] Tabitha Black resides [facility]. Screening for care coordination services as benefit of health plan and PCP.   Update received from Jill Side, Programmer, systems. Tabitha Black is scheduled to transition home on Saturday, July 27th. Will have ongoing services with [facility] for PT/OT/Nursing. Daughter to stay with her for a while. Jill Side reports Tabitha Black is doing great.  PCP is with [facility].   No identifiable care coordination needs.   Raiford Noble, MSN, RN,BSN [facility] Post Acute Care Coordinator [phone] (Direct dial)

## 2023-02-11 DIAGNOSIS — N2581 Secondary hyperparathyroidism of renal origin: Secondary | ICD-10-CM | POA: Diagnosis not present

## 2023-02-11 DIAGNOSIS — D631 Anemia in chronic kidney disease: Secondary | ICD-10-CM | POA: Diagnosis not present

## 2023-02-11 DIAGNOSIS — I129 Hypertensive chronic kidney disease with stage 1 through stage 4 chronic kidney disease, or unspecified chronic kidney disease: Secondary | ICD-10-CM | POA: Diagnosis not present

## 2023-02-11 DIAGNOSIS — N184 Chronic kidney disease, stage 4 (severe): Secondary | ICD-10-CM | POA: Diagnosis not present

## 2023-02-13 DIAGNOSIS — I700 Atherosclerosis of aorta: Secondary | ICD-10-CM | POA: Diagnosis not present

## 2023-02-13 DIAGNOSIS — D631 Anemia in chronic kidney disease: Secondary | ICD-10-CM | POA: Diagnosis not present

## 2023-02-13 DIAGNOSIS — D62 Acute posthemorrhagic anemia: Secondary | ICD-10-CM | POA: Diagnosis not present

## 2023-02-13 DIAGNOSIS — M19071 Primary osteoarthritis, right ankle and foot: Secondary | ICD-10-CM | POA: Diagnosis not present

## 2023-02-13 DIAGNOSIS — H9193 Unspecified hearing loss, bilateral: Secondary | ICD-10-CM | POA: Diagnosis not present

## 2023-02-13 DIAGNOSIS — I2510 Atherosclerotic heart disease of native coronary artery without angina pectoris: Secondary | ICD-10-CM | POA: Diagnosis not present

## 2023-02-13 DIAGNOSIS — K254 Chronic or unspecified gastric ulcer with hemorrhage: Secondary | ICD-10-CM | POA: Diagnosis not present

## 2023-02-13 DIAGNOSIS — E876 Hypokalemia: Secondary | ICD-10-CM | POA: Diagnosis not present

## 2023-02-13 DIAGNOSIS — I5032 Chronic diastolic (congestive) heart failure: Secondary | ICD-10-CM | POA: Diagnosis not present

## 2023-02-13 DIAGNOSIS — G8929 Other chronic pain: Secondary | ICD-10-CM | POA: Diagnosis not present

## 2023-02-13 DIAGNOSIS — I132 Hypertensive heart and chronic kidney disease with heart failure and with stage 5 chronic kidney disease, or end stage renal disease: Secondary | ICD-10-CM | POA: Diagnosis not present

## 2023-02-13 DIAGNOSIS — M8580 Other specified disorders of bone density and structure, unspecified site: Secondary | ICD-10-CM | POA: Diagnosis not present

## 2023-02-13 DIAGNOSIS — L97411 Non-pressure chronic ulcer of right heel and midfoot limited to breakdown of skin: Secondary | ICD-10-CM | POA: Diagnosis not present

## 2023-02-13 DIAGNOSIS — E1122 Type 2 diabetes mellitus with diabetic chronic kidney disease: Secondary | ICD-10-CM | POA: Diagnosis not present

## 2023-02-13 DIAGNOSIS — I872 Venous insufficiency (chronic) (peripheral): Secondary | ICD-10-CM | POA: Diagnosis not present

## 2023-02-13 DIAGNOSIS — E1151 Type 2 diabetes mellitus with diabetic peripheral angiopathy without gangrene: Secondary | ICD-10-CM | POA: Diagnosis not present

## 2023-02-13 DIAGNOSIS — N185 Chronic kidney disease, stage 5: Secondary | ICD-10-CM | POA: Diagnosis not present

## 2023-02-13 DIAGNOSIS — L97911 Non-pressure chronic ulcer of unspecified part of right lower leg limited to breakdown of skin: Secondary | ICD-10-CM | POA: Diagnosis not present

## 2023-02-13 DIAGNOSIS — I890 Lymphedema, not elsewhere classified: Secondary | ICD-10-CM | POA: Diagnosis not present

## 2023-02-13 DIAGNOSIS — I081 Rheumatic disorders of both mitral and tricuspid valves: Secondary | ICD-10-CM | POA: Diagnosis not present

## 2023-02-13 DIAGNOSIS — Z48815 Encounter for surgical aftercare following surgery on the digestive system: Secondary | ICD-10-CM | POA: Diagnosis not present

## 2023-02-13 DIAGNOSIS — M545 Low back pain, unspecified: Secondary | ICD-10-CM | POA: Diagnosis not present

## 2023-02-13 DIAGNOSIS — I4819 Other persistent atrial fibrillation: Secondary | ICD-10-CM | POA: Diagnosis not present

## 2023-02-14 DIAGNOSIS — K439 Ventral hernia without obstruction or gangrene: Secondary | ICD-10-CM | POA: Diagnosis not present

## 2023-02-14 DIAGNOSIS — I4891 Unspecified atrial fibrillation: Secondary | ICD-10-CM | POA: Diagnosis not present

## 2023-02-14 DIAGNOSIS — L97411 Non-pressure chronic ulcer of right heel and midfoot limited to breakdown of skin: Secondary | ICD-10-CM | POA: Diagnosis not present

## 2023-02-14 DIAGNOSIS — I872 Venous insufficiency (chronic) (peripheral): Secondary | ICD-10-CM | POA: Diagnosis not present

## 2023-02-14 DIAGNOSIS — Z48815 Encounter for surgical aftercare following surgery on the digestive system: Secondary | ICD-10-CM | POA: Diagnosis not present

## 2023-02-14 DIAGNOSIS — D62 Acute posthemorrhagic anemia: Secondary | ICD-10-CM | POA: Diagnosis not present

## 2023-02-14 DIAGNOSIS — L97911 Non-pressure chronic ulcer of unspecified part of right lower leg limited to breakdown of skin: Secondary | ICD-10-CM | POA: Diagnosis not present

## 2023-02-14 DIAGNOSIS — Z09 Encounter for follow-up examination after completed treatment for conditions other than malignant neoplasm: Secondary | ICD-10-CM | POA: Diagnosis not present

## 2023-02-14 DIAGNOSIS — N184 Chronic kidney disease, stage 4 (severe): Secondary | ICD-10-CM | POA: Diagnosis not present

## 2023-02-14 DIAGNOSIS — E1151 Type 2 diabetes mellitus with diabetic peripheral angiopathy without gangrene: Secondary | ICD-10-CM | POA: Diagnosis not present

## 2023-02-14 DIAGNOSIS — Z8719 Personal history of other diseases of the digestive system: Secondary | ICD-10-CM | POA: Diagnosis not present

## 2023-02-14 DIAGNOSIS — I890 Lymphedema, not elsewhere classified: Secondary | ICD-10-CM | POA: Diagnosis not present

## 2023-02-15 ENCOUNTER — Encounter (HOSPITAL_COMMUNITY)
Admission: RE | Admit: 2023-02-15 | Discharge: 2023-02-15 | Disposition: A | Discharge status: Home / Self Care | Payer: Medicare Other | Source: Ambulatory Visit | Attending: Nephrology | Admitting: Nephrology

## 2023-02-15 DIAGNOSIS — N185 Chronic kidney disease, stage 5: Secondary | ICD-10-CM

## 2023-02-15 MED ORDER — EPOETIN ALFA-EPBX 10000 UNIT/ML IJ SOLN
10000.0000 [IU] | INTRAMUSCULAR | Status: DC
  Administered 2023-02-15: 10000 [IU] via SUBCUTANEOUS

## 2023-02-15 MED ORDER — EPOETIN ALFA-EPBX 10000 UNIT/ML IJ SOLN
INTRAMUSCULAR | Status: AC
  Filled 2023-02-15: qty 1

## 2023-02-16 DIAGNOSIS — D62 Acute posthemorrhagic anemia: Secondary | ICD-10-CM | POA: Diagnosis not present

## 2023-02-16 DIAGNOSIS — E1151 Type 2 diabetes mellitus with diabetic peripheral angiopathy without gangrene: Secondary | ICD-10-CM | POA: Diagnosis not present

## 2023-02-16 DIAGNOSIS — I872 Venous insufficiency (chronic) (peripheral): Secondary | ICD-10-CM | POA: Diagnosis not present

## 2023-02-16 DIAGNOSIS — Z48815 Encounter for surgical aftercare following surgery on the digestive system: Secondary | ICD-10-CM | POA: Diagnosis not present

## 2023-02-16 DIAGNOSIS — L97411 Non-pressure chronic ulcer of right heel and midfoot limited to breakdown of skin: Secondary | ICD-10-CM | POA: Diagnosis not present

## 2023-02-16 DIAGNOSIS — L97911 Non-pressure chronic ulcer of unspecified part of right lower leg limited to breakdown of skin: Secondary | ICD-10-CM | POA: Diagnosis not present

## 2023-02-17 DIAGNOSIS — D62 Acute posthemorrhagic anemia: Secondary | ICD-10-CM | POA: Diagnosis not present

## 2023-02-17 DIAGNOSIS — L97411 Non-pressure chronic ulcer of right heel and midfoot limited to breakdown of skin: Secondary | ICD-10-CM | POA: Diagnosis not present

## 2023-02-17 DIAGNOSIS — L97911 Non-pressure chronic ulcer of unspecified part of right lower leg limited to breakdown of skin: Secondary | ICD-10-CM | POA: Diagnosis not present

## 2023-02-17 DIAGNOSIS — Z48815 Encounter for surgical aftercare following surgery on the digestive system: Secondary | ICD-10-CM | POA: Diagnosis not present

## 2023-02-17 DIAGNOSIS — E1151 Type 2 diabetes mellitus with diabetic peripheral angiopathy without gangrene: Secondary | ICD-10-CM | POA: Diagnosis not present

## 2023-02-17 DIAGNOSIS — I872 Venous insufficiency (chronic) (peripheral): Secondary | ICD-10-CM | POA: Diagnosis not present

## 2023-02-22 DIAGNOSIS — D62 Acute posthemorrhagic anemia: Secondary | ICD-10-CM | POA: Diagnosis not present

## 2023-02-22 DIAGNOSIS — L97911 Non-pressure chronic ulcer of unspecified part of right lower leg limited to breakdown of skin: Secondary | ICD-10-CM | POA: Diagnosis not present

## 2023-02-22 DIAGNOSIS — I872 Venous insufficiency (chronic) (peripheral): Secondary | ICD-10-CM | POA: Diagnosis not present

## 2023-02-22 DIAGNOSIS — L97411 Non-pressure chronic ulcer of right heel and midfoot limited to breakdown of skin: Secondary | ICD-10-CM | POA: Diagnosis not present

## 2023-02-22 DIAGNOSIS — Z48815 Encounter for surgical aftercare following surgery on the digestive system: Secondary | ICD-10-CM | POA: Diagnosis not present

## 2023-02-22 DIAGNOSIS — E1151 Type 2 diabetes mellitus with diabetic peripheral angiopathy without gangrene: Secondary | ICD-10-CM | POA: Diagnosis not present

## 2023-02-22 NOTE — Progress Notes (Unsigned)
Cardiology Office Note:    Date:  02/23/2023  ID:  ZOÀ LEIST, DOB Jan 02, 1933, MRN [id number] PCP: Irven Coe, MD  [facility] Providers Cardiologist:  Charlton Haws, MD       Patient Profile:      Apical variant HCM TTE 11/18/2016: EF 60-65, no RWMA, GR 2 DD, trivial MR, moderate LAE, normal RVSF, PASP 48, asymmetric LVH of LV apex (spade shaped ventricle) TTE 01/11/2023: EF 60-65, no RWMA, mild reduced RVSF, severe RVE, severe pulmonary hypertension, severe BAE, mild-moderate MR, moderate to severe TR, AV sclerosis, RAP 15 Persistent atrial fibrillation  (HFpEF) heart failure with preserved ejection fraction  Hypertension   ? Creatinine with ACE/ARB; intolerant of hydralazine Hyperlipidemia  Diabetes mellitus  Chronic kidney disease  Morbid obesity  Anemia  Lower ext edema; Lymphedema  Myoview 12/28/2016: No ischemia or infarction, EF 65, low risk History of upper GI bleed (08/2022; 10/2022) >> requiring PRBCs           Discussed the use of AI scribe software for clinical note transcription with the patient, who gave verbal consent to proceed.  History of Present Illness   The patient, an 89 year old with a history of apical variant hypertrophic cardiomyopathy, heart failure with preserved ejection fraction, and atrial fibrillation, presents for scheduled follow of CHF, AFib. The patient was admitted in June for RLQ abd wall hernia and underwent lap repair. She was seen by Dr. Royann Shivers and was cleared for surgery. Echo showed normal EF. She did not have issues with post op CHF.  She has follow up soon with her nephrologist, Dr. Allena Katz, who has not made any recent changes to the patient's furosemide medication. The patient is unsure about the exact dosage of furosemide she is taking. Chart was reviewed and it appears that she was supposed to be DC in June with Furosemide 60 mg twice daily. Her weight is up since DC. She denies any increase in shortness of breath, chest discomfort,  dizziness, or passing out. The patient has not noticed any blood in her stool or urine. The patient has been receiving home care for leg wrapping due to lymphedema and is trying to get into a lymphedema clinic in [place].      ROS: See HPI    Studies Reviewed:   EKG Interpretation Date/Time:  Wednesday February 23 2023 11:28:15 EDT Ventricular Rate:  70 PR Interval:    QRS Duration:  84 QT Interval:  396 QTC Calculation: 427 R Axis:   79  Text Interpretation: Atrial fibrillation Normal axis Similar to prior tracing Confirmed by Tereso Newcomer [phone]) on 02/23/2023 11:47:54 AM    Risk Assessment/Calculations:    CHA2DS2-VASc Score = 6   This indicates a 9.7% annual risk of stroke. The patient's score is based upon: CHF History: 0 HTN History: 1 Diabetes History: 1 Stroke History: 0 Vascular Disease History: 1 Age Score: 2 Gender Score: 1            Physical Exam:   VS:  BP (!) 130/58   Pulse 70   Ht 5\' 3"  (1.6 m)   Wt 169 lb 6.4 oz (76.8 kg)   SpO2 98%   BMI 30.01 kg/m    Wt Readings from Last 3 Encounters:  02/23/23 169 lb 6.4 oz (76.8 kg)  01/14/23 159 lb 9.8 oz (72.4 kg)  01/04/23 154 lb (69.9 kg)    GEN: Well nourished, well developed in no acute distress  NECK: No HJR CARDIAC: Irreg Irreg rhythm RESPIRATORY:  Clear to auscultation without rales, wheezing or rhonchi  ABDOMEN: Soft EXTREMITIES:  massive bilateral leg edema; R leg w ACE wrap      Assessment and Plan:  (HFpEF) heart failure with preserved ejection fraction (HCC) Volume appears to be increased. She has had increased weight and leg swelling. No rales on exam.  -Check furosemide dosing at home and adjust accordingly. If taking less than 60mg  twice daily, increase to this dose. If already taking 60mg  twice daily, increase to 80mg  twice daily. -Order basic metabolic panel today to monitor kidney function.  Persistent atrial fibrillation (HCC) Heart rate is controlled. She has not had further signs  of bleeding. Recent Hgb stable. -Based on age, creatinine, continue Eliquis 2.5mg  twice daily -Continue Toprol XL 50mg  daily. -Keep scheduled appointment with Dr. Lalla Brothers later this month to evaluate for left atrial appendage occlusion device.  Chronic kidney disease (CKD), active medical management without dialysis, stage 5 (HCC) Followed by Nephrology, Dr. Allena Katz. -BMET today -Follow-up appointment and labs with Dr. Allena Katz later this month.  HTN (hypertension) Controlled on doxazosin 4mg  daily, isosorbide 15mg  daily, and Toprol XL 50mg  daily. -Continue current medication regimen.  Lymphedema of both lower extremities - R > L Contributing to leg swelling. -She is awaiting evaluation at lymphedema clinic.      Dispo:  Return in about 3 months (around 05/26/2023) for Routine Follow Up w/ Dr. Eden Emms.  Signed, Tereso Newcomer, PA-C

## 2023-02-23 ENCOUNTER — Encounter: Payer: Self-pay | Admitting: Physician Assistant

## 2023-02-23 ENCOUNTER — Ambulatory Visit: Payer: Medicare Other | Attending: Physician Assistant | Admitting: Physician Assistant

## 2023-02-23 VITALS — BP 130/58 | HR 70 | Ht 63.0 in | Wt 169.4 lb

## 2023-02-23 DIAGNOSIS — I890 Lymphedema, not elsewhere classified: Secondary | ICD-10-CM | POA: Diagnosis not present

## 2023-02-23 DIAGNOSIS — I4819 Other persistent atrial fibrillation: Secondary | ICD-10-CM

## 2023-02-23 DIAGNOSIS — N185 Chronic kidney disease, stage 5: Secondary | ICD-10-CM

## 2023-02-23 DIAGNOSIS — I5032 Chronic diastolic (congestive) heart failure: Secondary | ICD-10-CM | POA: Diagnosis not present

## 2023-02-23 DIAGNOSIS — I10 Essential (primary) hypertension: Secondary | ICD-10-CM

## 2023-02-23 NOTE — Assessment & Plan Note (Signed)
Contributing to leg swelling. -She is awaiting evaluation at lymphedema clinic.

## 2023-02-23 NOTE — Assessment & Plan Note (Signed)
Heart rate is controlled. She has not had further signs of bleeding. Recent Hgb stable. -Based on age, creatinine, continue Eliquis 2.5mg  twice daily -Continue Toprol XL 50mg  daily. -Keep scheduled appointment with Dr. Lalla Brothers later this month to evaluate for left atrial appendage occlusion device.

## 2023-02-23 NOTE — Assessment & Plan Note (Signed)
Controlled on doxazosin 4mg  daily, isosorbide 15mg  daily, and Toprol XL 50mg  daily. -Continue current medication regimen.

## 2023-02-23 NOTE — Patient Instructions (Addendum)
Medication Instructions:   PLEASE CHECK  MEDICINE LIST WHEN YOU GET HOME AND SEND MYCHART MESSAGE OF CURRENT LASIX DOSE .  *If you need a refill on your cardiac medications before your next appointment, please call your pharmacy*   Lab Work: BMET TODAY    If you have labs (blood work) drawn today and your tests are completely normal, you will receive your results only by: MyChart Message (if you have MyChart) OR A paper copy in the mail If you have any lab test that is abnormal or we need to change your treatment, we will call you to review the results.   Testing/Procedures: NONE ORDERED  TODAY     Follow-Up: At [facility], you and your health needs are our priority.  As part of our continuing mission to provide you with exceptional heart care, we have created designated Provider Care Teams.  These Care Teams include your primary Cardiologist (physician) and Advanced Practice Providers (APPs -  Physician Assistants and Nurse Practitioners) who all work together to provide you with the care you need, when you need it.  We recommend signing up for the patient portal called "MyChart".  Sign up information is provided on this After Visit Summary.  MyChart is used to connect with patients for Virtual Visits (Telemedicine).  Patients are able to view lab/test results, encounter notes, upcoming appointments, etc.  Non-urgent messages can be sent to your provider as well.   To learn more about what you can do with MyChart, go to [email or web address].    Your next appointment:   3 -4 month(s)  Provider:   Charlton Haws, MD     Other Instructions

## 2023-02-23 NOTE — Assessment & Plan Note (Addendum)
Followed by Nephrology, Dr. Allena Katz. -BMET today -Follow-up appointment and labs with Dr. Allena Katz later this month.

## 2023-02-23 NOTE — Assessment & Plan Note (Signed)
Volume appears to be increased. She has had increased weight and leg swelling. No rales on exam.  -Check furosemide dosing at home and adjust accordingly. If taking less than 60mg  twice daily, increase to this dose. If already taking 60mg  twice daily, increase to 80mg  twice daily. -Order basic metabolic panel today to monitor kidney function.

## 2023-02-24 ENCOUNTER — Other Ambulatory Visit: Payer: Self-pay

## 2023-02-24 ENCOUNTER — Telehealth: Payer: Self-pay

## 2023-02-24 ENCOUNTER — Telehealth: Payer: Self-pay | Admitting: Cardiovascular Disease

## 2023-02-24 DIAGNOSIS — L97411 Non-pressure chronic ulcer of right heel and midfoot limited to breakdown of skin: Secondary | ICD-10-CM | POA: Diagnosis not present

## 2023-02-24 DIAGNOSIS — L97911 Non-pressure chronic ulcer of unspecified part of right lower leg limited to breakdown of skin: Secondary | ICD-10-CM | POA: Diagnosis not present

## 2023-02-24 DIAGNOSIS — E1151 Type 2 diabetes mellitus with diabetic peripheral angiopathy without gangrene: Secondary | ICD-10-CM | POA: Diagnosis not present

## 2023-02-24 DIAGNOSIS — Z48815 Encounter for surgical aftercare following surgery on the digestive system: Secondary | ICD-10-CM | POA: Diagnosis not present

## 2023-02-24 DIAGNOSIS — I4819 Other persistent atrial fibrillation: Secondary | ICD-10-CM

## 2023-02-24 DIAGNOSIS — I872 Venous insufficiency (chronic) (peripheral): Secondary | ICD-10-CM | POA: Diagnosis not present

## 2023-02-24 DIAGNOSIS — I5032 Chronic diastolic (congestive) heart failure: Secondary | ICD-10-CM

## 2023-02-24 DIAGNOSIS — D62 Acute posthemorrhagic anemia: Secondary | ICD-10-CM | POA: Diagnosis not present

## 2023-02-24 NOTE — Telephone Encounter (Signed)
Pt c/o medication issue:  1. Name of Medication: furosemide (LASIX) 20 MG tablet   2. How are you currently taking this medication (dosage and times per day)? Take 80 mg by mouth 2 (two) times daily.   3. Are you having a reaction (difficulty breathing--STAT)? No  4. What is your medication issue? Patient's daughter is calling to inform us about how the patient is taking this medication. The daughter states the patient is taking 80MG  tablets and she takes one in the morning and one in the evening. Patient's daughter stated Dr. Allena Katz said it was okay for the patient to take the medication. Please advise.

## 2023-02-24 NOTE — Telephone Encounter (Signed)
Spoke with the patient's daughter and advised that I had spoken to the patient this morning already. Advised on medication changes and lab work from [place] (see previous phone note).

## 2023-02-24 NOTE — Addendum Note (Signed)
Addended by: Frutoso Schatz on: 02/24/2023 09:41 AM   Modules accepted: Orders

## 2023-02-24 NOTE — Telephone Encounter (Signed)
-----   Message from Tereso Newcomer sent at 02/24/2023  7:50 AM EDT ----- Result note sent to Tabitha Black via MyChart. See comments below. PLAN:  -Hold K+ -BMET Monday or Tuesday (Dx: hyperkalemia, chronic kidney disease) -Call pt to ask exactly how she is taking Lasix. She did not know yesterday. I need to adjust her dose.  Tabitha Black  Your kidney function (Creatinine) is stable. Your potassium is high. I have on your med list that you take potassium 20 mEq once daily. Stop taking the potassium for now. Let me know exactly how you are taking the Furosemide (Lasix).  Tereso Newcomer, PA-C    02/24/2023 7:38 AM

## 2023-02-24 NOTE — Telephone Encounter (Signed)
Spoke with the patient and advised on Scott's recommendations. Patient verbalized understanding.

## 2023-02-24 NOTE — Telephone Encounter (Signed)
Increase Lasix to 120 mg twice daily x 1 week, then reduce back to 80 mg twice daily. BMET in 1 week. Tereso Newcomer, PA-C    02/24/2023 8:51 AM

## 2023-02-24 NOTE — Telephone Encounter (Signed)
The patient has been notified of the result and verbalized understanding.  All questions (if any) were answered. Frutoso Schatz, RN 02/24/2023 8:06 AM  Patient will hold potassium. She is scheduled for repeat labs next Tuesday.   She is taking Lasix 80 mg twice daily. Med list has been updated.

## 2023-02-26 DIAGNOSIS — E1151 Type 2 diabetes mellitus with diabetic peripheral angiopathy without gangrene: Secondary | ICD-10-CM | POA: Diagnosis not present

## 2023-02-26 DIAGNOSIS — L97911 Non-pressure chronic ulcer of unspecified part of right lower leg limited to breakdown of skin: Secondary | ICD-10-CM | POA: Diagnosis not present

## 2023-02-26 DIAGNOSIS — D62 Acute posthemorrhagic anemia: Secondary | ICD-10-CM | POA: Diagnosis not present

## 2023-02-26 DIAGNOSIS — Z48815 Encounter for surgical aftercare following surgery on the digestive system: Secondary | ICD-10-CM | POA: Diagnosis not present

## 2023-02-26 DIAGNOSIS — L97411 Non-pressure chronic ulcer of right heel and midfoot limited to breakdown of skin: Secondary | ICD-10-CM | POA: Diagnosis not present

## 2023-02-26 DIAGNOSIS — I872 Venous insufficiency (chronic) (peripheral): Secondary | ICD-10-CM | POA: Diagnosis not present

## 2023-02-28 DIAGNOSIS — Z48815 Encounter for surgical aftercare following surgery on the digestive system: Secondary | ICD-10-CM | POA: Diagnosis not present

## 2023-02-28 DIAGNOSIS — E1151 Type 2 diabetes mellitus with diabetic peripheral angiopathy without gangrene: Secondary | ICD-10-CM | POA: Diagnosis not present

## 2023-02-28 DIAGNOSIS — I872 Venous insufficiency (chronic) (peripheral): Secondary | ICD-10-CM | POA: Diagnosis not present

## 2023-02-28 DIAGNOSIS — L97911 Non-pressure chronic ulcer of unspecified part of right lower leg limited to breakdown of skin: Secondary | ICD-10-CM | POA: Diagnosis not present

## 2023-02-28 DIAGNOSIS — L97411 Non-pressure chronic ulcer of right heel and midfoot limited to breakdown of skin: Secondary | ICD-10-CM | POA: Diagnosis not present

## 2023-02-28 DIAGNOSIS — D62 Acute posthemorrhagic anemia: Secondary | ICD-10-CM | POA: Diagnosis not present

## 2023-03-01 ENCOUNTER — Telehealth: Payer: Self-pay

## 2023-03-01 ENCOUNTER — Encounter (HOSPITAL_COMMUNITY)
Admission: RE | Admit: 2023-03-01 | Discharge: 2023-03-01 | Disposition: A | Discharge status: Home / Self Care | Payer: Medicare Other | Source: Ambulatory Visit | Attending: Nephrology | Admitting: Nephrology

## 2023-03-01 ENCOUNTER — Ambulatory Visit: Payer: Medicare Other | Attending: Physician Assistant

## 2023-03-01 VITALS — BP 140/46 | HR 80 | Temp 97.3°F | Resp 17

## 2023-03-01 DIAGNOSIS — Z79899 Other long term (current) drug therapy: Secondary | ICD-10-CM | POA: Insufficient documentation

## 2023-03-01 DIAGNOSIS — D62 Acute posthemorrhagic anemia: Secondary | ICD-10-CM | POA: Diagnosis not present

## 2023-03-01 DIAGNOSIS — I872 Venous insufficiency (chronic) (peripheral): Secondary | ICD-10-CM | POA: Diagnosis not present

## 2023-03-01 DIAGNOSIS — N185 Chronic kidney disease, stage 5: Secondary | ICD-10-CM | POA: Insufficient documentation

## 2023-03-01 DIAGNOSIS — L97411 Non-pressure chronic ulcer of right heel and midfoot limited to breakdown of skin: Secondary | ICD-10-CM | POA: Diagnosis not present

## 2023-03-01 DIAGNOSIS — I5032 Chronic diastolic (congestive) heart failure: Secondary | ICD-10-CM

## 2023-03-01 DIAGNOSIS — L97911 Non-pressure chronic ulcer of unspecified part of right lower leg limited to breakdown of skin: Secondary | ICD-10-CM | POA: Diagnosis not present

## 2023-03-01 DIAGNOSIS — I4819 Other persistent atrial fibrillation: Secondary | ICD-10-CM

## 2023-03-01 DIAGNOSIS — Z48815 Encounter for surgical aftercare following surgery on the digestive system: Secondary | ICD-10-CM | POA: Diagnosis not present

## 2023-03-01 DIAGNOSIS — E1151 Type 2 diabetes mellitus with diabetic peripheral angiopathy without gangrene: Secondary | ICD-10-CM | POA: Diagnosis not present

## 2023-03-01 LAB — CBC WITH DIFFERENTIAL/PLATELET
Abs Immature Granulocytes: 0.01 K/uL (ref 0.00–0.07)
Basophils Absolute: 0 K/uL (ref 0.0–0.1)
Basophils Relative: 1 %
Eosinophils Absolute: 0.1 K/uL (ref 0.0–0.5)
Eosinophils Relative: 2 %
HCT: 29.2 % — ABNORMAL LOW (ref 36.0–46.0)
Hemoglobin: 9 g/dL — ABNORMAL LOW (ref 12.0–15.0)
Immature Granulocytes: 0 %
Lymphocytes Relative: 22 %
Lymphs Abs: 0.8 K/uL (ref 0.7–4.0)
MCH: 30.3 pg (ref 26.0–34.0)
MCHC: 30.8 g/dL (ref 30.0–36.0)
MCV: 98.3 fL (ref 80.0–100.0)
Monocytes Absolute: 0.3 K/uL (ref 0.1–1.0)
Monocytes Relative: 9 %
Neutro Abs: 2.4 K/uL (ref 1.7–7.7)
Neutrophils Relative %: 66 %
Platelets: 154 K/uL (ref 150–400)
RBC: 2.97 MIL/uL — ABNORMAL LOW (ref 3.87–5.11)
RDW: 14.6 % (ref 11.5–15.5)
WBC: 3.6 K/uL — ABNORMAL LOW (ref 4.0–10.5)
nRBC: 0 % (ref 0.0–0.2)

## 2023-03-01 LAB — POCT HEMOGLOBIN-HEMACUE: Hemoglobin: 8.9 g/dL — ABNORMAL LOW (ref 12.0–15.0)

## 2023-03-01 LAB — IRON AND TIBC
Iron: 23 ug/dL — ABNORMAL LOW (ref 28–170)
Saturation Ratios: 10 % — ABNORMAL LOW (ref 10.4–31.8)
TIBC: 237 ug/dL — ABNORMAL LOW (ref 250–450)
UIBC: 214 ug/dL

## 2023-03-01 LAB — FERRITIN: Ferritin: 444 ng/mL — ABNORMAL HIGH (ref 11–307)

## 2023-03-01 MED ORDER — EPOETIN ALFA-EPBX 10000 UNIT/ML IJ SOLN
10000.0000 [IU] | INTRAMUSCULAR | Status: DC
  Administered 2023-03-01: 10000 [IU] via SUBCUTANEOUS

## 2023-03-01 MED ORDER — EPOETIN ALFA-EPBX 10000 UNIT/ML IJ SOLN
INTRAMUSCULAR | Status: AC
  Filled 2023-03-01: qty 1

## 2023-03-01 NOTE — Telephone Encounter (Signed)
 Renal coordinator attempted to call patient on today regarding Safe Start referral. No answer from patient after multiple rings. Coordinator left confidential voicemail for patient to return call.  Will attempt to call back within 1 week.   Baruch Gouty Renal Coordinator [facility] [phone]

## 2023-03-02 ENCOUNTER — Telehealth: Payer: Self-pay | Admitting: Cardiovascular Disease

## 2023-03-02 NOTE — Telephone Encounter (Signed)
Patient calling with question concerning medication. Please advise

## 2023-03-02 NOTE — Telephone Encounter (Signed)
Pt was calling back to see if Tabitha Black got back with [place] about resuming her potassium supplement.  Made the pt aware of lab results per Tereso Newcomer PA-C earlier this morning and we had to send the result back to him to further clarify about resuming her potassium supplement and at what dose.  Pts potassium has been held from prior lab result on 8/7, due to elevated K level of 5.7 at that time.  Her K level now is now normal 4.2.  Below is copied result note from recent lab yesterday:  Informed the pt that as soon as Tabitha Black gets back with [place] about her potassium regimen, triage will call her shortly thereafter with what he suggest.    In the meantime I did advise her that she can obtain a healthy amount of potassium in her diet (pt education provided) until Tabitha Black gets back with [place] on how she should resume her potassium regimen.  Pt verbalized understanding and agrees with this plan.     Loa Socks, LPN 01/15/5283  1:32 AM EDT Back to Top    The patient has been notified of the result and verbalized understanding.  All questions (if any) were answered.   Pt wanted to clarify with Tabitha Black on how she should proceed with taking her potassium supplement, being this has been on hold since last lab result on 8/8.  She is in clear understanding about lasix dosing, just needs clarification on KDUR, and if she should continue holding this medication or take this again at previous dose or reduced dose.   Pt is aware that I will forward this result back to Tereso Newcomer PA-C for potassium clarification, and triage will follow-up with her accordingly thereafter.   Pt verbalized understanding and agrees with this plan.   Beatrice Lecher, PA-C 03/02/2023  5:56 AM EDT     Renal function stable. K+ normal. PLAN: -Continue current medications and f/u as planned. Tereso Newcomer, PA-C 05:55 03/02/23

## 2023-03-03 ENCOUNTER — Telehealth: Payer: Self-pay | Admitting: Cardiovascular Disease

## 2023-03-03 DIAGNOSIS — L97411 Non-pressure chronic ulcer of right heel and midfoot limited to breakdown of skin: Secondary | ICD-10-CM | POA: Diagnosis not present

## 2023-03-03 DIAGNOSIS — E1151 Type 2 diabetes mellitus with diabetic peripheral angiopathy without gangrene: Secondary | ICD-10-CM | POA: Diagnosis not present

## 2023-03-03 DIAGNOSIS — L97911 Non-pressure chronic ulcer of unspecified part of right lower leg limited to breakdown of skin: Secondary | ICD-10-CM | POA: Diagnosis not present

## 2023-03-03 DIAGNOSIS — Z48815 Encounter for surgical aftercare following surgery on the digestive system: Secondary | ICD-10-CM | POA: Diagnosis not present

## 2023-03-03 DIAGNOSIS — I872 Venous insufficiency (chronic) (peripheral): Secondary | ICD-10-CM | POA: Diagnosis not present

## 2023-03-03 DIAGNOSIS — D62 Acute posthemorrhagic anemia: Secondary | ICD-10-CM | POA: Diagnosis not present

## 2023-03-03 NOTE — Telephone Encounter (Signed)
See Tele Note from 03/03/23. Tereso Newcomer, PA-C    03/03/2023 1:59 PM

## 2023-03-03 NOTE — Telephone Encounter (Signed)
Franchot Gallo  to [place], [place]      03/03/23 11:57 AM FYI Demetrios Loll L      03/03/23 11:57 AM Note Discussed Scott's recommendations with patient:   Remain off K+ for now. Arrange repeat BMET in 1 week. If she has labs with Nephrology next week, she can just get with them and have labs faxed to me. Tereso Newcomer, PA-C   03/03/2023 8:46 AM     Patient states she is having her labs drawn with nephrology between 03/10/23 and 03/17/23. She states she will have her nephrologist fax results to our office for Scott to review.   Patient verbalized understanding of instructions above and expressed appreciation for follow-up.

## 2023-03-03 NOTE — Telephone Encounter (Signed)
Patient is returning call in regards to labs. Requesting call back to discuss.

## 2023-03-03 NOTE — Telephone Encounter (Signed)
Discussed Scott's recommendations with patient:  Remain off K+ for now. Arrange repeat BMET in 1 week. If she has labs with Nephrology next week, she can just get with them and have labs faxed to me. Tereso Newcomer, PA-C   03/03/2023 8:46 AM    Patient states she is having her labs drawn with nephrology between 03/10/23 and 03/17/23. She states she will have her nephrologist fax results to our office for Scott to review.  Patient verbalized understanding of instructions above and expressed appreciation for follow-up.

## 2023-03-04 DIAGNOSIS — E1151 Type 2 diabetes mellitus with diabetic peripheral angiopathy without gangrene: Secondary | ICD-10-CM | POA: Diagnosis not present

## 2023-03-04 DIAGNOSIS — L97911 Non-pressure chronic ulcer of unspecified part of right lower leg limited to breakdown of skin: Secondary | ICD-10-CM | POA: Diagnosis not present

## 2023-03-04 DIAGNOSIS — L97411 Non-pressure chronic ulcer of right heel and midfoot limited to breakdown of skin: Secondary | ICD-10-CM | POA: Diagnosis not present

## 2023-03-04 DIAGNOSIS — I872 Venous insufficiency (chronic) (peripheral): Secondary | ICD-10-CM | POA: Diagnosis not present

## 2023-03-04 DIAGNOSIS — Z48815 Encounter for surgical aftercare following surgery on the digestive system: Secondary | ICD-10-CM | POA: Diagnosis not present

## 2023-03-04 DIAGNOSIS — D62 Acute posthemorrhagic anemia: Secondary | ICD-10-CM | POA: Diagnosis not present

## 2023-03-07 DIAGNOSIS — E1151 Type 2 diabetes mellitus with diabetic peripheral angiopathy without gangrene: Secondary | ICD-10-CM | POA: Diagnosis not present

## 2023-03-07 DIAGNOSIS — Z48815 Encounter for surgical aftercare following surgery on the digestive system: Secondary | ICD-10-CM | POA: Diagnosis not present

## 2023-03-07 DIAGNOSIS — D62 Acute posthemorrhagic anemia: Secondary | ICD-10-CM | POA: Diagnosis not present

## 2023-03-07 DIAGNOSIS — L97411 Non-pressure chronic ulcer of right heel and midfoot limited to breakdown of skin: Secondary | ICD-10-CM | POA: Diagnosis not present

## 2023-03-07 DIAGNOSIS — I872 Venous insufficiency (chronic) (peripheral): Secondary | ICD-10-CM | POA: Diagnosis not present

## 2023-03-07 DIAGNOSIS — L97911 Non-pressure chronic ulcer of unspecified part of right lower leg limited to breakdown of skin: Secondary | ICD-10-CM | POA: Diagnosis not present

## 2023-03-08 ENCOUNTER — Other Ambulatory Visit: Payer: Self-pay | Admitting: *Deleted

## 2023-03-08 DIAGNOSIS — Z79899 Other long term (current) drug therapy: Secondary | ICD-10-CM

## 2023-03-09 LAB — BASIC METABOLIC PANEL
BUN/Creatinine Ratio: 22 (ref 12–28)
BUN: 65 mg/dL — ABNORMAL HIGH (ref 8–27)
CO2: 22 mmol/L (ref 20–29)
Calcium: 9.8 mg/dL (ref 8.7–10.3)
Chloride: 98 mmol/L (ref 96–106)
Creatinine, Ser: 2.99 mg/dL — ABNORMAL HIGH (ref 0.57–1.00)
Glucose: 107 mg/dL — ABNORMAL HIGH (ref 70–99)
Potassium: 4.4 mmol/L (ref 3.5–5.2)
Sodium: 136 mmol/L (ref 134–144)
eGFR: 15 mL/min/{1.73_m2} — ABNORMAL LOW (ref >59)

## 2023-03-10 DIAGNOSIS — Z48815 Encounter for surgical aftercare following surgery on the digestive system: Secondary | ICD-10-CM | POA: Diagnosis not present

## 2023-03-10 DIAGNOSIS — D62 Acute posthemorrhagic anemia: Secondary | ICD-10-CM | POA: Diagnosis not present

## 2023-03-10 DIAGNOSIS — I872 Venous insufficiency (chronic) (peripheral): Secondary | ICD-10-CM | POA: Diagnosis not present

## 2023-03-10 DIAGNOSIS — L97411 Non-pressure chronic ulcer of right heel and midfoot limited to breakdown of skin: Secondary | ICD-10-CM | POA: Diagnosis not present

## 2023-03-10 DIAGNOSIS — E1151 Type 2 diabetes mellitus with diabetic peripheral angiopathy without gangrene: Secondary | ICD-10-CM | POA: Diagnosis not present

## 2023-03-10 DIAGNOSIS — L97911 Non-pressure chronic ulcer of unspecified part of right lower leg limited to breakdown of skin: Secondary | ICD-10-CM | POA: Diagnosis not present

## 2023-03-11 DIAGNOSIS — I872 Venous insufficiency (chronic) (peripheral): Secondary | ICD-10-CM | POA: Diagnosis not present

## 2023-03-11 DIAGNOSIS — L97411 Non-pressure chronic ulcer of right heel and midfoot limited to breakdown of skin: Secondary | ICD-10-CM | POA: Diagnosis not present

## 2023-03-11 DIAGNOSIS — D62 Acute posthemorrhagic anemia: Secondary | ICD-10-CM | POA: Diagnosis not present

## 2023-03-11 DIAGNOSIS — E1151 Type 2 diabetes mellitus with diabetic peripheral angiopathy without gangrene: Secondary | ICD-10-CM | POA: Diagnosis not present

## 2023-03-11 DIAGNOSIS — L97911 Non-pressure chronic ulcer of unspecified part of right lower leg limited to breakdown of skin: Secondary | ICD-10-CM | POA: Diagnosis not present

## 2023-03-11 DIAGNOSIS — Z48815 Encounter for surgical aftercare following surgery on the digestive system: Secondary | ICD-10-CM | POA: Diagnosis not present

## 2023-03-14 DIAGNOSIS — L97911 Non-pressure chronic ulcer of unspecified part of right lower leg limited to breakdown of skin: Secondary | ICD-10-CM | POA: Diagnosis not present

## 2023-03-14 DIAGNOSIS — Z48815 Encounter for surgical aftercare following surgery on the digestive system: Secondary | ICD-10-CM | POA: Diagnosis not present

## 2023-03-14 DIAGNOSIS — L97411 Non-pressure chronic ulcer of right heel and midfoot limited to breakdown of skin: Secondary | ICD-10-CM | POA: Diagnosis not present

## 2023-03-14 DIAGNOSIS — D62 Acute posthemorrhagic anemia: Secondary | ICD-10-CM | POA: Diagnosis not present

## 2023-03-14 DIAGNOSIS — I872 Venous insufficiency (chronic) (peripheral): Secondary | ICD-10-CM | POA: Diagnosis not present

## 2023-03-14 DIAGNOSIS — E1151 Type 2 diabetes mellitus with diabetic peripheral angiopathy without gangrene: Secondary | ICD-10-CM | POA: Diagnosis not present

## 2023-03-15 ENCOUNTER — Encounter (HOSPITAL_COMMUNITY)
Admission: RE | Admit: 2023-03-15 | Discharge: 2023-03-15 | Disposition: A | Discharge status: Home / Self Care | Payer: Medicare Other | Source: Ambulatory Visit | Attending: Nephrology | Admitting: Nephrology

## 2023-03-15 VITALS — BP 144/41 | HR 80 | Temp 97.9°F | Resp 19

## 2023-03-15 DIAGNOSIS — I4819 Other persistent atrial fibrillation: Secondary | ICD-10-CM | POA: Diagnosis not present

## 2023-03-15 DIAGNOSIS — I081 Rheumatic disorders of both mitral and tricuspid valves: Secondary | ICD-10-CM | POA: Diagnosis not present

## 2023-03-15 DIAGNOSIS — M19071 Primary osteoarthritis, right ankle and foot: Secondary | ICD-10-CM | POA: Diagnosis not present

## 2023-03-15 DIAGNOSIS — E876 Hypokalemia: Secondary | ICD-10-CM | POA: Diagnosis not present

## 2023-03-15 DIAGNOSIS — I700 Atherosclerosis of aorta: Secondary | ICD-10-CM | POA: Diagnosis not present

## 2023-03-15 DIAGNOSIS — Z79899 Other long term (current) drug therapy: Secondary | ICD-10-CM | POA: Diagnosis not present

## 2023-03-15 DIAGNOSIS — H9193 Unspecified hearing loss, bilateral: Secondary | ICD-10-CM | POA: Diagnosis not present

## 2023-03-15 DIAGNOSIS — N185 Chronic kidney disease, stage 5: Secondary | ICD-10-CM | POA: Diagnosis not present

## 2023-03-15 DIAGNOSIS — I2510 Atherosclerotic heart disease of native coronary artery without angina pectoris: Secondary | ICD-10-CM | POA: Diagnosis not present

## 2023-03-15 DIAGNOSIS — I5032 Chronic diastolic (congestive) heart failure: Secondary | ICD-10-CM | POA: Diagnosis not present

## 2023-03-15 DIAGNOSIS — I890 Lymphedema, not elsewhere classified: Secondary | ICD-10-CM | POA: Diagnosis not present

## 2023-03-15 DIAGNOSIS — D631 Anemia in chronic kidney disease: Secondary | ICD-10-CM | POA: Diagnosis not present

## 2023-03-15 DIAGNOSIS — E1122 Type 2 diabetes mellitus with diabetic chronic kidney disease: Secondary | ICD-10-CM | POA: Diagnosis not present

## 2023-03-15 DIAGNOSIS — I132 Hypertensive heart and chronic kidney disease with heart failure and with stage 5 chronic kidney disease, or end stage renal disease: Secondary | ICD-10-CM | POA: Diagnosis not present

## 2023-03-15 DIAGNOSIS — M545 Low back pain, unspecified: Secondary | ICD-10-CM | POA: Diagnosis not present

## 2023-03-15 DIAGNOSIS — K254 Chronic or unspecified gastric ulcer with hemorrhage: Secondary | ICD-10-CM | POA: Diagnosis not present

## 2023-03-15 DIAGNOSIS — D62 Acute posthemorrhagic anemia: Secondary | ICD-10-CM | POA: Diagnosis not present

## 2023-03-15 DIAGNOSIS — E1151 Type 2 diabetes mellitus with diabetic peripheral angiopathy without gangrene: Secondary | ICD-10-CM | POA: Diagnosis not present

## 2023-03-15 DIAGNOSIS — M8580 Other specified disorders of bone density and structure, unspecified site: Secondary | ICD-10-CM | POA: Diagnosis not present

## 2023-03-15 DIAGNOSIS — L97911 Non-pressure chronic ulcer of unspecified part of right lower leg limited to breakdown of skin: Secondary | ICD-10-CM | POA: Diagnosis not present

## 2023-03-15 DIAGNOSIS — G8929 Other chronic pain: Secondary | ICD-10-CM | POA: Diagnosis not present

## 2023-03-15 DIAGNOSIS — I872 Venous insufficiency (chronic) (peripheral): Secondary | ICD-10-CM | POA: Diagnosis not present

## 2023-03-15 DIAGNOSIS — L97411 Non-pressure chronic ulcer of right heel and midfoot limited to breakdown of skin: Secondary | ICD-10-CM | POA: Diagnosis not present

## 2023-03-15 DIAGNOSIS — Z48815 Encounter for surgical aftercare following surgery on the digestive system: Secondary | ICD-10-CM | POA: Diagnosis not present

## 2023-03-15 LAB — POCT HEMOGLOBIN-HEMACUE: Hemoglobin: 9.4 g/dL — ABNORMAL LOW (ref 12.0–15.0)

## 2023-03-15 MED ORDER — EPOETIN ALFA-EPBX 10000 UNIT/ML IJ SOLN: 10000.0000 [IU] | INTRAMUSCULAR | Status: DC

## 2023-03-15 MED ORDER — EPOETIN ALFA-EPBX 10000 UNIT/ML IJ SOLN
INTRAMUSCULAR | Status: AC
  Administered 2023-03-15: 10000 [IU] via SUBCUTANEOUS
  Filled 2023-03-15: qty 1

## 2023-03-16 ENCOUNTER — Encounter: Payer: Self-pay | Admitting: Cardiology

## 2023-03-16 ENCOUNTER — Ambulatory Visit: Payer: Medicare Other | Attending: Cardiology | Admitting: Cardiology

## 2023-03-16 VITALS — BP 122/70 | HR 87 | Ht 63.0 in | Wt 166.0 lb

## 2023-03-16 DIAGNOSIS — I4891 Unspecified atrial fibrillation: Secondary | ICD-10-CM | POA: Insufficient documentation

## 2023-03-16 DIAGNOSIS — I5032 Chronic diastolic (congestive) heart failure: Secondary | ICD-10-CM | POA: Diagnosis not present

## 2023-03-16 DIAGNOSIS — Z8719 Personal history of other diseases of the digestive system: Secondary | ICD-10-CM | POA: Insufficient documentation

## 2023-03-16 NOTE — Patient Instructions (Signed)
Medication Instructions:  Your physician recommends that you continue on your current medications as directed. Please refer to the Current Medication list given to you today.   *If you need a refill on your cardiac medications before your next appointment, please call your pharmacy*  Follow-Up: At [facility], you and your health needs are our priority.  As part of our continuing mission to provide you with exceptional heart care, we have created designated Provider Care Teams.  These Care Teams include your primary Cardiologist (physician) and Advanced Practice Providers (APPs -  Physician Assistants and Nurse Practitioners) who all work together to provide you with the care you need, when you need it.  Your next appointment:   As needed

## 2023-03-16 NOTE — Progress Notes (Signed)
  Electrophysiology Office Note:    Date:  03/16/2023   ID:  Marium, Petri February 03, 1933, MRN [id number]  [facility] Cardiologist:  Charlton Haws, MD  [facility] Electrophysiologist:  Lanier Prude, MD   Referring MD: Beatrice Lecher, PA-C   Chief Complaint: Atrial fibrillation  History of Present Illness:    SAFINA WIGFALL is a 89 y.o. femalewho I am seeing today for an evaluation of atrial fibrillation at the request of Tereso Newcomer.  The patient was last seen by Lorin Picket on February 23, 2023.  The patient has a medical history that includes chronic diastolic heart failure, persistent atrial fibrillation, CKD 5, hypertension, lymphedema.  The patient has a history of upper GI bleeding in February and April 2024 requiring PRBC transfusion.  She is referred to discuss the possibility of left atrial appendage occlusion as a stroke risk mitigation strategy that would allow her to avoid long-term exposure anticoagulation.  She is with her family today in clinic.        Their past medical, social and family history was reveiwed.   ROS:   Please see the history of present illness.    All other systems reviewed and are negative.  EKGs/Labs/Other Studies Reviewed:    The following studies were reviewed today:  January 11, 2023 echo EF 60 to 65% RV function mildly reduced Severely dilated left and right atrium Mild to moderate MR Moderate to severe TR  March 08, 2023 lab work reviewed and shows a creatinine of 2.99, BUN of 65, EGFR of 15       Physical Exam:    VS:  BP 122/70   Pulse 87   Ht 5\' 3"  (1.6 m)   Wt 166 lb (75.3 kg)   SpO2 97%   BMI 29.41 kg/m     Wt Readings from Last 3 Encounters:  03/16/23 166 lb (75.3 kg)  02/23/23 169 lb 6.4 oz (76.8 kg)  01/14/23 159 lb 9.8 oz (72.4 kg)     GEN:  Well nourished, well developed in no acute distress CARDIAC: Irregularly irregular, no murmurs, rubs, gallops RESPIRATORY:  Clear to auscultation without  rales, wheezing or rhonchi       ASSESSMENT AND PLAN:    1. Atrial fibrillation, unspecified type (HCC)   2. Chronic heart failure with preserved ejection fraction (HCC)   3. History of GI bleed     #Atrial fibrillation #History of GI bleeding #Chronic kidney disease The patient has a history of atrial fibrillation on Eliquis for stroke prophylaxis.  Unfortunately, she has had multiple GI bleeding episodes requiring PRBC transfusion.  She is referred to discuss left atrial appendage occlusion as a stroke risk mitigation strategy.  Unfortunately, the patient has advanced kidney disease.  Her kidney disease along with her advanced age put her at prohibitive risk to undergo left atrial appendage occlusion.  I discussed this in detail with the patient during today's visit and she is understanding.  She will continue Eliquis.  #Chronic diastolic heart failure Volume control has been difficult given her advanced kidney disease.  Recommend she continue taking Lasix 80 mg by mouth twice daily.  Continue Imdur, metoprolol, Cardura.  Follow-up with primary cardiologist.    Follow-up with EP on an as-needed basis   Signed, Rossie Muskrat. Lalla Brothers, MD, [facility], [facility] 03/16/2023 1:55 PM    Electrophysiology [facility]

## 2023-03-17 DIAGNOSIS — E1151 Type 2 diabetes mellitus with diabetic peripheral angiopathy without gangrene: Secondary | ICD-10-CM | POA: Diagnosis not present

## 2023-03-17 DIAGNOSIS — Z48815 Encounter for surgical aftercare following surgery on the digestive system: Secondary | ICD-10-CM | POA: Diagnosis not present

## 2023-03-17 DIAGNOSIS — L97411 Non-pressure chronic ulcer of right heel and midfoot limited to breakdown of skin: Secondary | ICD-10-CM | POA: Diagnosis not present

## 2023-03-17 DIAGNOSIS — L97911 Non-pressure chronic ulcer of unspecified part of right lower leg limited to breakdown of skin: Secondary | ICD-10-CM | POA: Diagnosis not present

## 2023-03-17 DIAGNOSIS — I872 Venous insufficiency (chronic) (peripheral): Secondary | ICD-10-CM | POA: Diagnosis not present

## 2023-03-17 DIAGNOSIS — D62 Acute posthemorrhagic anemia: Secondary | ICD-10-CM | POA: Diagnosis not present

## 2023-03-21 DIAGNOSIS — L97911 Non-pressure chronic ulcer of unspecified part of right lower leg limited to breakdown of skin: Secondary | ICD-10-CM | POA: Diagnosis not present

## 2023-03-21 DIAGNOSIS — Z48815 Encounter for surgical aftercare following surgery on the digestive system: Secondary | ICD-10-CM | POA: Diagnosis not present

## 2023-03-21 DIAGNOSIS — L97411 Non-pressure chronic ulcer of right heel and midfoot limited to breakdown of skin: Secondary | ICD-10-CM | POA: Diagnosis not present

## 2023-03-21 DIAGNOSIS — E1151 Type 2 diabetes mellitus with diabetic peripheral angiopathy without gangrene: Secondary | ICD-10-CM | POA: Diagnosis not present

## 2023-03-21 DIAGNOSIS — D62 Acute posthemorrhagic anemia: Secondary | ICD-10-CM | POA: Diagnosis not present

## 2023-03-21 DIAGNOSIS — I872 Venous insufficiency (chronic) (peripheral): Secondary | ICD-10-CM | POA: Diagnosis not present

## 2023-03-22 DIAGNOSIS — L97911 Non-pressure chronic ulcer of unspecified part of right lower leg limited to breakdown of skin: Secondary | ICD-10-CM | POA: Diagnosis not present

## 2023-03-22 DIAGNOSIS — L97411 Non-pressure chronic ulcer of right heel and midfoot limited to breakdown of skin: Secondary | ICD-10-CM | POA: Diagnosis not present

## 2023-03-22 DIAGNOSIS — Z48815 Encounter for surgical aftercare following surgery on the digestive system: Secondary | ICD-10-CM | POA: Diagnosis not present

## 2023-03-22 DIAGNOSIS — D62 Acute posthemorrhagic anemia: Secondary | ICD-10-CM | POA: Diagnosis not present

## 2023-03-22 DIAGNOSIS — I872 Venous insufficiency (chronic) (peripheral): Secondary | ICD-10-CM | POA: Diagnosis not present

## 2023-03-22 DIAGNOSIS — E1151 Type 2 diabetes mellitus with diabetic peripheral angiopathy without gangrene: Secondary | ICD-10-CM | POA: Diagnosis not present

## 2023-03-24 ENCOUNTER — Telehealth: Payer: Self-pay

## 2023-03-24 DIAGNOSIS — L97911 Non-pressure chronic ulcer of unspecified part of right lower leg limited to breakdown of skin: Secondary | ICD-10-CM | POA: Diagnosis not present

## 2023-03-24 DIAGNOSIS — D62 Acute posthemorrhagic anemia: Secondary | ICD-10-CM | POA: Diagnosis not present

## 2023-03-24 DIAGNOSIS — L97411 Non-pressure chronic ulcer of right heel and midfoot limited to breakdown of skin: Secondary | ICD-10-CM | POA: Diagnosis not present

## 2023-03-24 DIAGNOSIS — I872 Venous insufficiency (chronic) (peripheral): Secondary | ICD-10-CM | POA: Diagnosis not present

## 2023-03-24 DIAGNOSIS — Z48815 Encounter for surgical aftercare following surgery on the digestive system: Secondary | ICD-10-CM | POA: Diagnosis not present

## 2023-03-24 DIAGNOSIS — E1151 Type 2 diabetes mellitus with diabetic peripheral angiopathy without gangrene: Secondary | ICD-10-CM | POA: Diagnosis not present

## 2023-03-24 NOTE — Telephone Encounter (Signed)
Renal coordinator attempted to call patient on today regarding Safe Start referral. Successful outreach made today with patient. Patient stated no needs at this time. Renal coordinator shared contact information with patient when she feels [facility] resources are needed.   Note will be sent back to clinical lead at [facility] regarding patient decline.  Baruch Gouty Renal Coordinator [facility] [phone]

## 2023-03-26 ENCOUNTER — Emergency Department (HOSPITAL_COMMUNITY): Payer: Medicare Other

## 2023-03-26 ENCOUNTER — Inpatient Hospital Stay (HOSPITAL_COMMUNITY): Payer: Medicare Other

## 2023-03-26 ENCOUNTER — Other Ambulatory Visit: Payer: Self-pay

## 2023-03-26 ENCOUNTER — Inpatient Hospital Stay (HOSPITAL_COMMUNITY)
Admission: EM | Admit: 2023-03-26 | Discharge: 2023-03-30 | DRG: 605 | Disposition: A | Discharge status: Skilled Nursing Facility | Payer: Medicare Other | Attending: Internal Medicine | Admitting: Internal Medicine

## 2023-03-26 DIAGNOSIS — R609 Edema, unspecified: Secondary | ICD-10-CM | POA: Diagnosis not present

## 2023-03-26 DIAGNOSIS — D509 Iron deficiency anemia, unspecified: Secondary | ICD-10-CM | POA: Diagnosis present

## 2023-03-26 DIAGNOSIS — L8990 Pressure ulcer of unspecified site, unspecified stage: Secondary | ICD-10-CM | POA: Insufficient documentation

## 2023-03-26 DIAGNOSIS — Z96651 Presence of right artificial knee joint: Secondary | ICD-10-CM | POA: Diagnosis present

## 2023-03-26 DIAGNOSIS — Z7901 Long term (current) use of anticoagulants: Secondary | ICD-10-CM | POA: Diagnosis not present

## 2023-03-26 DIAGNOSIS — N184 Chronic kidney disease, stage 4 (severe): Secondary | ICD-10-CM | POA: Diagnosis not present

## 2023-03-26 DIAGNOSIS — E119 Type 2 diabetes mellitus without complications: Secondary | ICD-10-CM

## 2023-03-26 DIAGNOSIS — I5032 Chronic diastolic (congestive) heart failure: Secondary | ICD-10-CM | POA: Diagnosis present

## 2023-03-26 DIAGNOSIS — I890 Lymphedema, not elsewhere classified: Secondary | ICD-10-CM | POA: Diagnosis not present

## 2023-03-26 DIAGNOSIS — Z8249 Family history of ischemic heart disease and other diseases of the circulatory system: Secondary | ICD-10-CM | POA: Diagnosis not present

## 2023-03-26 DIAGNOSIS — E876 Hypokalemia: Secondary | ICD-10-CM | POA: Diagnosis not present

## 2023-03-26 DIAGNOSIS — Y92009 Unspecified place in unspecified non-institutional (private) residence as the place of occurrence of the external cause: Secondary | ICD-10-CM | POA: Diagnosis not present

## 2023-03-26 DIAGNOSIS — W19XXXD Unspecified fall, subsequent encounter: Secondary | ICD-10-CM | POA: Diagnosis not present

## 2023-03-26 DIAGNOSIS — D631 Anemia in chronic kidney disease: Secondary | ICD-10-CM | POA: Diagnosis not present

## 2023-03-26 DIAGNOSIS — N179 Acute kidney failure, unspecified: Secondary | ICD-10-CM | POA: Diagnosis present

## 2023-03-26 DIAGNOSIS — D649 Anemia, unspecified: Secondary | ICD-10-CM | POA: Diagnosis not present

## 2023-03-26 DIAGNOSIS — N261 Atrophy of kidney (terminal): Secondary | ICD-10-CM | POA: Diagnosis not present

## 2023-03-26 DIAGNOSIS — I132 Hypertensive heart and chronic kidney disease with heart failure and with stage 5 chronic kidney disease, or end stage renal disease: Secondary | ICD-10-CM | POA: Diagnosis present

## 2023-03-26 DIAGNOSIS — I5030 Unspecified diastolic (congestive) heart failure: Secondary | ICD-10-CM | POA: Diagnosis not present

## 2023-03-26 DIAGNOSIS — D6832 Hemorrhagic disorder due to extrinsic circulating anticoagulants: Secondary | ICD-10-CM | POA: Diagnosis present

## 2023-03-26 DIAGNOSIS — N250 Renal osteodystrophy: Secondary | ICD-10-CM | POA: Diagnosis not present

## 2023-03-26 DIAGNOSIS — R748 Abnormal levels of other serum enzymes: Principal | ICD-10-CM

## 2023-03-26 DIAGNOSIS — I10 Essential (primary) hypertension: Secondary | ICD-10-CM | POA: Diagnosis present

## 2023-03-26 DIAGNOSIS — Z880 Allergy status to penicillin: Secondary | ICD-10-CM

## 2023-03-26 DIAGNOSIS — M1990 Unspecified osteoarthritis, unspecified site: Secondary | ICD-10-CM | POA: Diagnosis not present

## 2023-03-26 DIAGNOSIS — M25461 Effusion, right knee: Secondary | ICD-10-CM | POA: Diagnosis not present

## 2023-03-26 DIAGNOSIS — Z7401 Bed confinement status: Secondary | ICD-10-CM | POA: Diagnosis not present

## 2023-03-26 DIAGNOSIS — M5412 Radiculopathy, cervical region: Secondary | ICD-10-CM | POA: Diagnosis not present

## 2023-03-26 DIAGNOSIS — W010XXA Fall on same level from slipping, tripping and stumbling without subsequent striking against object, initial encounter: Secondary | ICD-10-CM | POA: Diagnosis present

## 2023-03-26 DIAGNOSIS — R600 Localized edema: Secondary | ICD-10-CM | POA: Diagnosis not present

## 2023-03-26 DIAGNOSIS — S8001XD Contusion of right knee, subsequent encounter: Secondary | ICD-10-CM | POA: Diagnosis not present

## 2023-03-26 DIAGNOSIS — S61501D Unspecified open wound of right wrist, subsequent encounter: Secondary | ICD-10-CM | POA: Diagnosis not present

## 2023-03-26 DIAGNOSIS — E782 Mixed hyperlipidemia: Secondary | ICD-10-CM | POA: Diagnosis not present

## 2023-03-26 DIAGNOSIS — E1122 Type 2 diabetes mellitus with diabetic chronic kidney disease: Secondary | ICD-10-CM | POA: Diagnosis not present

## 2023-03-26 DIAGNOSIS — N1330 Unspecified hydronephrosis: Secondary | ICD-10-CM | POA: Diagnosis present

## 2023-03-26 DIAGNOSIS — M25531 Pain in right wrist: Secondary | ICD-10-CM | POA: Diagnosis not present

## 2023-03-26 DIAGNOSIS — N2581 Secondary hyperparathyroidism of renal origin: Secondary | ICD-10-CM | POA: Diagnosis present

## 2023-03-26 DIAGNOSIS — Z881 Allergy status to other antibiotic agents status: Secondary | ICD-10-CM | POA: Diagnosis not present

## 2023-03-26 DIAGNOSIS — I480 Paroxysmal atrial fibrillation: Secondary | ICD-10-CM | POA: Diagnosis present

## 2023-03-26 DIAGNOSIS — I959 Hypotension, unspecified: Secondary | ICD-10-CM | POA: Diagnosis not present

## 2023-03-26 DIAGNOSIS — M25561 Pain in right knee: Secondary | ICD-10-CM | POA: Diagnosis not present

## 2023-03-26 DIAGNOSIS — N281 Cyst of kidney, acquired: Secondary | ICD-10-CM | POA: Diagnosis not present

## 2023-03-26 DIAGNOSIS — S8002XA Contusion of left knee, initial encounter: Secondary | ICD-10-CM | POA: Diagnosis not present

## 2023-03-26 DIAGNOSIS — K922 Gastrointestinal hemorrhage, unspecified: Secondary | ICD-10-CM | POA: Diagnosis not present

## 2023-03-26 DIAGNOSIS — E1121 Type 2 diabetes mellitus with diabetic nephropathy: Secondary | ICD-10-CM | POA: Diagnosis not present

## 2023-03-26 DIAGNOSIS — W19XXXA Unspecified fall, initial encounter: Secondary | ICD-10-CM | POA: Diagnosis not present

## 2023-03-26 DIAGNOSIS — S8000XA Contusion of unspecified knee, initial encounter: Secondary | ICD-10-CM | POA: Diagnosis present

## 2023-03-26 DIAGNOSIS — Z888 Allergy status to other drugs, medicaments and biological substances status: Secondary | ICD-10-CM

## 2023-03-26 DIAGNOSIS — Z85828 Personal history of other malignant neoplasm of skin: Secondary | ICD-10-CM | POA: Diagnosis not present

## 2023-03-26 DIAGNOSIS — S0990XA Unspecified injury of head, initial encounter: Secondary | ICD-10-CM | POA: Diagnosis not present

## 2023-03-26 DIAGNOSIS — Z981 Arthrodesis status: Secondary | ICD-10-CM | POA: Diagnosis not present

## 2023-03-26 DIAGNOSIS — R58 Hemorrhage, not elsewhere classified: Secondary | ICD-10-CM | POA: Diagnosis not present

## 2023-03-26 DIAGNOSIS — Z79899 Other long term (current) drug therapy: Secondary | ICD-10-CM | POA: Diagnosis not present

## 2023-03-26 DIAGNOSIS — I4891 Unspecified atrial fibrillation: Secondary | ICD-10-CM

## 2023-03-26 DIAGNOSIS — S199XXA Unspecified injury of neck, initial encounter: Secondary | ICD-10-CM | POA: Diagnosis not present

## 2023-03-26 DIAGNOSIS — S8001XA Contusion of right knee, initial encounter: Principal | ICD-10-CM | POA: Diagnosis present

## 2023-03-26 DIAGNOSIS — N185 Chronic kidney disease, stage 5: Secondary | ICD-10-CM | POA: Diagnosis present

## 2023-03-26 DIAGNOSIS — K219 Gastro-esophageal reflux disease without esophagitis: Secondary | ICD-10-CM | POA: Diagnosis not present

## 2023-03-26 DIAGNOSIS — K5900 Constipation, unspecified: Secondary | ICD-10-CM | POA: Diagnosis not present

## 2023-03-26 DIAGNOSIS — M19041 Primary osteoarthritis, right hand: Secondary | ICD-10-CM | POA: Diagnosis not present

## 2023-03-26 DIAGNOSIS — K439 Ventral hernia without obstruction or gangrene: Secondary | ICD-10-CM | POA: Diagnosis not present

## 2023-03-26 DIAGNOSIS — R54 Age-related physical debility: Secondary | ICD-10-CM | POA: Diagnosis present

## 2023-03-26 DIAGNOSIS — R112 Nausea with vomiting, unspecified: Secondary | ICD-10-CM | POA: Diagnosis not present

## 2023-03-26 DIAGNOSIS — M8580 Other specified disorders of bone density and structure, unspecified site: Secondary | ICD-10-CM | POA: Diagnosis not present

## 2023-03-26 DIAGNOSIS — M898X9 Other specified disorders of bone, unspecified site: Secondary | ICD-10-CM | POA: Diagnosis present

## 2023-03-26 DIAGNOSIS — G319 Degenerative disease of nervous system, unspecified: Secondary | ICD-10-CM | POA: Diagnosis not present

## 2023-03-26 DIAGNOSIS — S51011A Laceration without foreign body of right elbow, initial encounter: Secondary | ICD-10-CM

## 2023-03-26 LAB — COMPREHENSIVE METABOLIC PANEL
ALT: 15 U/L (ref 0–44)
AST: 37 U/L (ref 15–41)
Albumin: 3.4 g/dL — ABNORMAL LOW (ref 3.5–5.0)
Alkaline Phosphatase: 66 U/L (ref 38–126)
Anion gap: 15 (ref 5–15)
BUN: 77 mg/dL — ABNORMAL HIGH (ref 8–23)
CO2: 20 mmol/L — ABNORMAL LOW (ref 22–32)
Calcium: 9.7 mg/dL (ref 8.9–10.3)
Chloride: 102 mmol/L (ref 98–111)
Creatinine, Ser: 3.48 mg/dL — ABNORMAL HIGH (ref 0.44–1.00)
GFR, Estimated: 12 mL/min — ABNORMAL LOW (ref >60)
Glucose, Bld: 127 mg/dL — ABNORMAL HIGH (ref 70–99)
Potassium: 4 mmol/L (ref 3.5–5.1)
Sodium: 137 mmol/L (ref 135–145)
Total Bilirubin: 1.1 mg/dL (ref 0.3–1.2)
Total Protein: 7 g/dL (ref 6.5–8.1)

## 2023-03-26 LAB — CBC WITH DIFFERENTIAL/PLATELET
Abs Immature Granulocytes: 0.06 10*3/uL (ref 0.00–0.07)
Basophils Absolute: 0 10*3/uL (ref 0.0–0.1)
Basophils Relative: 0 %
Eosinophils Absolute: 0 10*3/uL (ref 0.0–0.5)
Eosinophils Relative: 0 %
HCT: 30.1 % — ABNORMAL LOW (ref 36.0–46.0)
Hemoglobin: 9.5 g/dL — ABNORMAL LOW (ref 12.0–15.0)
Immature Granulocytes: 1 %
Lymphocytes Relative: 3 %
Lymphs Abs: 0.4 10*3/uL — ABNORMAL LOW (ref 0.7–4.0)
MCH: 30.4 pg (ref 26.0–34.0)
MCHC: 31.6 g/dL (ref 30.0–36.0)
MCV: 96.2 fL (ref 80.0–100.0)
Monocytes Absolute: 0.4 10*3/uL (ref 0.1–1.0)
Monocytes Relative: 3 %
Neutro Abs: 11.4 10*3/uL — ABNORMAL HIGH (ref 1.7–7.7)
Neutrophils Relative %: 93 %
Platelets: 179 10*3/uL (ref 150–400)
RBC: 3.13 MIL/uL — ABNORMAL LOW (ref 3.87–5.11)
RDW: 14.3 % (ref 11.5–15.5)
WBC: 12.3 10*3/uL — ABNORMAL HIGH (ref 4.0–10.5)
nRBC: 0 % (ref 0.0–0.2)

## 2023-03-26 LAB — CK: Total CK: 656 U/L — ABNORMAL HIGH (ref 38–234)

## 2023-03-26 MED ORDER — SODIUM CHLORIDE 0.9% FLUSH: 3.0000 mL | INTRAVENOUS | Status: DC | PRN

## 2023-03-26 MED ORDER — SODIUM CHLORIDE 0.9 % IV SOLN
200.0000 mg | Freq: Every day | INTRAVENOUS | Status: AC
  Administered 2023-03-27 – 2023-03-30 (×5): 200 mg via INTRAVENOUS
  Filled 2023-03-26 (×5): qty 10

## 2023-03-26 MED ORDER — ACETAMINOPHEN 325 MG PO TABS: 650.0000 mg | ORAL_TABLET | Freq: Four times a day (QID) | ORAL | Status: DC | PRN

## 2023-03-26 MED ORDER — BISACODYL 5 MG PO TBEC: 10.0000 mg | DELAYED_RELEASE_TABLET | Freq: Every day | ORAL | Status: DC | PRN

## 2023-03-26 MED ORDER — SODIUM CHLORIDE 0.9% FLUSH
3.0000 mL | Freq: Two times a day (BID) | INTRAVENOUS | Status: DC
  Administered 2023-03-27 – 2023-03-30 (×5): 3 mL via INTRAVENOUS

## 2023-03-26 MED ORDER — ACETAMINOPHEN 650 MG RE SUPP: 650.0000 mg | Freq: Four times a day (QID) | RECTAL | Status: DC | PRN

## 2023-03-26 MED ORDER — FUROSEMIDE 10 MG/ML IJ SOLN
4.0000 mg/h | INTRAVENOUS | Status: DC
  Administered 2023-03-26: 4 mg/h via INTRAVENOUS
  Filled 2023-03-26 (×2): qty 20

## 2023-03-26 MED ORDER — PANTOPRAZOLE SODIUM 40 MG PO TBEC
40.0000 mg | DELAYED_RELEASE_TABLET | Freq: Every day | ORAL | Status: DC
  Administered 2023-03-26 – 2023-03-30 (×5): 40 mg via ORAL
  Filled 2023-03-26 (×5): qty 1

## 2023-03-26 MED ORDER — SODIUM CHLORIDE 0.9 % IV SOLN: 250.0000 mL | INTRAVENOUS | Status: DC | PRN

## 2023-03-26 MED ORDER — POLYETHYLENE GLYCOL 3350 17 G PO PACK: 17.0000 g | PACK | Freq: Every day | ORAL | Status: DC | PRN

## 2023-03-26 MED ORDER — ONDANSETRON HCL 4 MG/2ML IJ SOLN: 4.0000 mg | Freq: Four times a day (QID) | INTRAMUSCULAR | Status: DC | PRN

## 2023-03-26 MED ORDER — ISOSORBIDE MONONITRATE ER 30 MG PO TB24
15.0000 mg | ORAL_TABLET | Freq: Every day | ORAL | Status: DC
  Administered 2023-03-27 – 2023-03-29 (×4): 15 mg via ORAL
  Filled 2023-03-26 (×4): qty 1

## 2023-03-26 MED ORDER — SODIUM CHLORIDE 0.9% FLUSH
3.0000 mL | Freq: Two times a day (BID) | INTRAVENOUS | Status: DC
  Administered 2023-03-26 – 2023-03-30 (×7): 3 mL via INTRAVENOUS

## 2023-03-26 MED ORDER — OXYCODONE HCL 5 MG PO TABS
5.0000 mg | ORAL_TABLET | ORAL | Status: DC | PRN
  Administered 2023-03-27 – 2023-03-29 (×6): 5 mg via ORAL
  Filled 2023-03-26 (×6): qty 1

## 2023-03-26 MED ORDER — ONDANSETRON HCL 4 MG PO TABS: 4.0000 mg | ORAL_TABLET | Freq: Four times a day (QID) | ORAL | Status: DC | PRN

## 2023-03-26 MED ORDER — METOPROLOL SUCCINATE ER 25 MG PO TB24
50.0000 mg | ORAL_TABLET | Freq: Every day | ORAL | Status: DC
  Administered 2023-03-27 – 2023-03-30 (×4): 50 mg via ORAL
  Filled 2023-03-26 (×4): qty 2

## 2023-03-26 NOTE — ED Notes (Signed)
Pt to US.

## 2023-03-26 NOTE — ED Triage Notes (Addendum)
BIB EMS from home, pt had an unwitnessed fall 6 hours ago no LOC and on thinners. Per EMS Pt has deformity on right shoulder and crepitus on right shoulder, pt c/o of right knee pain 7/10. Right forearm wrapped by fire due to skin tear from fall  Pt had bilateral una boots prior to arrival, upon assessment she had a skin tear to her right  lower extremity.

## 2023-03-26 NOTE — H&P (Signed)
[facility] History and Physical   Patient: Tabitha Black NFA:[id number]   PCP: Irven Coe, MD DOB: 08/30/1932   DOA: 03/26/2023   DOS: 03/26/2023   DOS: the patient was seen and examined on 03/26/2023  Patient coming from: The patient is coming from Home  Chief Complaint: Fall and Trauma to Right wrist and Knee   HPI: Tabitha Black is a 89 y.o. female with Past medical history of paroxysmal A-fib on DOAC, HTN, HLD, HFpEF, CKD stage V, GERD, iron deficiency anemia, GI bleed, cervical radiculopathy, osteoarthritis, chronic bilateral lower extremity lymphedema on Unna boots, as reviewed from EMR, presented at [facility], ED with complaining of fall while getting out of her recliner.  As per patient she was trying to get out of her recliner and she fell on the floor, her legs just gave way, denied LOC, no headache or dizziness, no lightheadedness, no chest pain or palpitations.  Patient got wound on her right wrist and was complaining of pain in the right knee.   ED Course: VS afebrile, HR 99, RR 20, BP 145/74, saturating well on room air. Abnormal labs CO2 20, glucose 127, BUN 77, creatinine 3.48, albumin 3.4 CK6 56 Limited, leukocytosis WBC 12.3, anemia hemoglobin 9.5  CT head and C-spine 1. No acute intracranial pathology. Moderate age-related atrophy and chronic microvascular ischemic changes. 2. No acute/traumatic cervical spine pathology.   X-ray right knee 1. Right total knee arthroplasty. 2.  No acute osseous injury of the right knee.  X-ray right wrist 1. No acute osseous injury of the right wrist. 2. Moderate osteoarthritis of the first MCP joint. Severe osteoarthritis of the first IP joint.   Review of Systems: as mentioned in the history of present illness.  All other systems reviewed and are negative.  Past Medical History:  Diagnosis Date   ABLA (acute blood loss anemia) 08/23/2022   CKD (chronic kidney disease)    Diabetes mellitus without complication (HCC)     type II   Gastrointestinal hemorrhage with melena 08/23/2022   GERD (gastroesophageal reflux disease)    High cholesterol    Hypertension    Osteoarthritis    Skin cancer, basal cell    Past Surgical History:  Procedure Laterality Date   ABDOMINAL HYSTERECTOMY N/A    APPENDECTOMY N/A 1953   BACK SURGERY     BIOPSY  08/25/2022   Procedure: BIOPSY;  Surgeon: Charlott Rakes, MD;  Location: [facility] ENDOSCOPY;  Service: Gastroenterology;;   CHOLECYSTECTOMY     COLONOSCOPY N/A 02/22/2008   COLONOSCOPY N/A 11/16/2022   Procedure: COLONOSCOPY;  Surgeon: Lynann Bologna, DO;  Location: [facility] ENDOSCOPY;  Service: Gastroenterology;  Laterality: N/A;   ESOPHAGOGASTRODUODENOSCOPY (EGD) WITH PROPOFOL N/A 08/25/2022   Procedure: ESOPHAGOGASTRODUODENOSCOPY (EGD) WITH PROPOFOL;  Surgeon: Charlott Rakes, MD;  Location: [facility] ENDOSCOPY;  Service: Gastroenterology;  Laterality: N/A;   ESOPHAGOGASTRODUODENOSCOPY (EGD) WITH PROPOFOL N/A 11/14/2022   Procedure: ESOPHAGOGASTRODUODENOSCOPY (EGD) WITH PROPOFOL;  Surgeon: Kathi Der, MD;  Location: [facility] ENDOSCOPY;  Service: Gastroenterology;  Laterality: N/A;   TOTAL KNEE ARTHROPLASTY Right    UMBILICAL HERNIA REPAIR N/A 04/02/2015   Procedure: LAPAROSCOPIC UMBILICAL HERNIA REPAIR WITH VENTRALIGHT MESH;  Surgeon: Axel Filler, MD;  Location: [facility] OR;  Service: General;  Laterality: N/A;   XI ROBOTIC ASSISTED INGUINAL HERNIA REPAIR WITH MESH N/A 01/12/2023   Procedure: XI ROBOTIC ASSISTED INCISIONAL  HERNIA REPAIR WITH MESH;  Surgeon: Axel Filler, MD;  Location: [facility] OR;  Service: General;  Laterality: N/A;   Social History:  reports that she has never smoked. She has never used smokeless tobacco. She reports current alcohol use. She reports that she does not use drugs.  Allergies  Allergen Reactions   Other Swelling    Topical mycin, unsure of which one, caused opthalmic swelling  Erythromycin    Penicillins Shortness Of Breath, Itching, Swelling and Rash    Lovastatin Other (See Comments)    Weakness and myalgias, maybe to statins?   Azithromycin Other (See Comments)    Patient doesn't remember having a reaction to this.   Clindamycin Other (See Comments)    Patient doesn't remember having a reaction to this.    Hydralazine Other (See Comments)    Felt foggy   Lisinopril Other (See Comments)    Hyperkalemia    Neomycin Sulfate [Neomycin] Other (See Comments)    Redness and burning in face   Niacin Hives    Facial swelling and redness   Rofecoxib Other (See Comments)    Unknown reaction   Tetracycline    Valsartan Other (See Comments)    Hyperkalemia    Vytorin [Ezetimibe-Simvastatin] Other (See Comments)    myalgias   Zetia [Ezetimibe] Other (See Comments)    Leg pain    Family history reviewed and not pertinent Family History  Problem Relation Age of Onset   Heart failure Mother    Hypertension Mother    Heart attack Father    Prostate cancer Father    Breast cancer Sister      Prior to Admission medications   Medication Sig Start Date End Date Taking? Authorizing Provider  acetaminophen (TYLENOL) 500 MG tablet Take 2 tablets (1,000 mg total) by mouth every 6 (six) hours. 01/14/23   Barnetta Chapel, PA-C  apixaban (ELIQUIS) 2.5 MG TABS tablet Take 1 tablet (2.5 mg total) by mouth 2 (two) times daily. 06/08/22   Wendall Stade, MD  Cholecalciferol (VITAMIN D3) 125 MCG (5000 UT) TABS Take 5,000 Units by mouth daily.    [provider]  doxazosin (CARDURA) 4 MG tablet Take 4 mg by mouth daily.    [provider]  furosemide (LASIX) 20 MG tablet Take 80 mg by mouth 2 (two) times daily.    [provider]  HYDROcodone-acetaminophen (NORCO/VICODIN) 5-325 MG tablet Take 1 tablet by mouth at bedtime.    [provider]  isosorbide mononitrate (IMDUR) 30 MG 24 hr tablet Take 1/2 tablet (15 mg) by mouth at bedtime. 10/06/22   Danford, Earl Lites, MD  metoprolol succinate (TOPROL-XL) 50 MG 24  hr tablet Take 1 tablet (50 mg total) by mouth every evening. Take with or immediately following a meal. 11/16/22   Willeen Niece, MD  Multiple Vitamins-Minerals (HAIR SKIN AND NAILS FORMULA) TABS Take 1 tablet by mouth daily.    [provider]  mupirocin ointment (BACTROBAN) 2 % Apply topically 2 (two) times daily. 01/04/23   Terrilee Files, MD  ondansetron (ZOFRAN-ODT) 4 MG disintegrating tablet Take 1 tablet (4 mg total) by mouth every 6 (six) hours as needed for nausea. 01/14/23   Marguerita Merles Latif, DO  pantoprazole (PROTONIX) 40 MG tablet Take 40 mg by mouth daily. 12/28/22   [provider]    Physical Exam: Vitals:   03/26/23 1630 03/26/23 1700 03/26/23 1726 03/26/23 1730  BP: (!) 156/64 (!) 142/57  (!) 143/63  Pulse: 82 91  89  Resp: 17 16  19   Temp:   98.2 F (36.8 C)   TempSrc:   Oral  SpO2: 100% 99%  99%  Weight:      Height:        General: alert and oriented to time, place, and person. Appear in mild distress, affect appropriate Eyes: PERRLA, Conjunctiva normal ENT: Oral Mucosa Clear, moist  Neck: no JVD, no Abnormal Mass Or lumps Cardiovascular: S1 and S2 Present, no Murmur, peripheral pulses symmetrical Respiratory: good respiratory effort, Bilateral Air entry equal and Decreased, no signs of accessory muscle use, Clear to Auscultation, no Crackles, no wheezes Abdomen: Bowel Sound present, Soft and no tenderness, no hernia Skin: Right wrist wound, dressing intact, soaked with blood. Extremities: Chronic lymphedema bilateral Unna boots intact, no calf tenderness, right knee swelling, hematoma and bruise.  Neurologic: without any new focal findings Gait not checked due to patient safety concerns  Data Reviewed: I have personally reviewed and interpreted labs, imaging as discussed below.  CBC: Recent Labs  Lab 03/26/23 1356  WBC 12.3*  NEUTROABS 11.4*  HGB 9.5*  HCT 30.1*  MCV 96.2  PLT 179   Basic Metabolic Panel: Recent Labs  Lab  03/26/23 1356  NA 137  K 4.0  CL 102  CO2 20*  GLUCOSE 127*  BUN 77*  CREATININE 3.48*  CALCIUM 9.7   GFR: Estimated Creatinine Clearance: 10.8 mL/min (A) (by C-G formula based on SCr of 3.48 mg/dL (H)). Liver Function Tests: Recent Labs  Lab 03/26/23 1356  AST 37  ALT 15  ALKPHOS 66  BILITOT 1.1  PROT 7.0  ALBUMIN 3.4*   No results for input(s): "LIPASE", "AMYLASE" in the last 168 hours. No results for input(s): "AMMONIA" in the last 168 hours. Coagulation Profile: No results for input(s): "INR", "PROTIME" in the last 168 hours. Cardiac Enzymes: Recent Labs  Lab 03/26/23 1356  CKTOTAL 656*   BNP (last 3 results) No results for input(s): "PROBNP" in the last 8760 hours. HbA1C: No results for input(s): "HGBA1C" in the last 72 hours. CBG: No results for input(s): "GLUCAP" in the last 168 hours. Lipid Profile: No results for input(s): "CHOL", "HDL", "LDLCALC", "TRIG", "CHOLHDL", "LDLDIRECT" in the last 72 hours. Thyroid Function Tests: No results for input(s): "TSH", "T4TOTAL", "FREET4", "T3FREE", "THYROIDAB" in the last 72 hours. Anemia Panel: No results for input(s): "VITAMINB12", "FOLATE", "FERRITIN", "TIBC", "IRON", "RETICCTPCT" in the last 72 hours. Urine analysis:    Component Value Date/Time   COLORURINE YELLOW 01/11/2023 1710   APPEARANCEUR CLEAR 01/11/2023 1710   LABSPEC 1.009 01/11/2023 1710   PHURINE 6.0 01/11/2023 1710   GLUCOSEU NEGATIVE 01/11/2023 1710   HGBUR NEGATIVE 01/11/2023 1710   BILIRUBINUR NEGATIVE 01/11/2023 1710   KETONESUR NEGATIVE 01/11/2023 1710   PROTEINUR NEGATIVE 01/11/2023 1710   UROBILINOGEN 1.0 04/01/2015 0425   NITRITE NEGATIVE 01/11/2023 1710   LEUKOCYTESUR NEGATIVE 01/11/2023 1710    Radiological Exams on Admission: CT Head Wo Contrast  Result Date: 03/26/2023 CLINICAL DATA:  Trauma and head injury. EXAM: CT HEAD WITHOUT CONTRAST CT CERVICAL SPINE WITHOUT CONTRAST TECHNIQUE: Multidetector CT imaging of the head and  cervical spine was performed following the standard protocol without intravenous contrast. Multiplanar CT image reconstructions of the cervical spine were also generated. RADIATION DOSE REDUCTION: This exam was performed according to the departmental dose-optimization program which includes automated exposure control, adjustment of the mA and/or kV according to patient size and/or use of iterative reconstruction technique. COMPARISON:  None Available. FINDINGS: Evaluation of this exam is limited due to motion artifact. CT HEAD FINDINGS Brain: Moderate age-related atrophy and chronic microvascular ischemic changes. There is  no acute intracranial hemorrhage. No mass effect or midline shift. No extra-axial fluid collection. Vascular: No hyperdense vessel or unexpected calcification. Skull: Normal. Negative for fracture or focal lesion. Sinuses/Orbits: No acute finding. Other: None CT CERVICAL SPINE FINDINGS Alignment: No acute subluxation. Grade 1 C4-C5 and T1-T2 anterolisthesis. Skull base and vertebrae: No acute fracture. Advanced osteopenia. There is incomplete bony fusion of the posterior ring of C1. Soft tissues and spinal canal: No prevertebral fluid or swelling. No visible canal hematoma. Disc levels: Multilevel degenerative changes and multilevel facet arthropathy. Upper chest: No acute findings. Other: Bilateral carotid bulb calcified plaques. IMPRESSION: 1. No acute intracranial pathology. Moderate age-related atrophy and chronic microvascular ischemic changes. 2. No acute/traumatic cervical spine pathology. Electronically Signed   By: Elgie Collard M.D.   On: 03/26/2023 17:09   CT Cervical Spine Wo Contrast  Result Date: 03/26/2023 CLINICAL DATA:  Trauma and head injury. EXAM: CT HEAD WITHOUT CONTRAST CT CERVICAL SPINE WITHOUT CONTRAST TECHNIQUE: Multidetector CT imaging of the head and cervical spine was performed following the standard protocol without intravenous contrast. Multiplanar CT image  reconstructions of the cervical spine were also generated. RADIATION DOSE REDUCTION: This exam was performed according to the departmental dose-optimization program which includes automated exposure control, adjustment of the mA and/or kV according to patient size and/or use of iterative reconstruction technique. COMPARISON:  None Available. FINDINGS: Evaluation of this exam is limited due to motion artifact. CT HEAD FINDINGS Brain: Moderate age-related atrophy and chronic microvascular ischemic changes. There is no acute intracranial hemorrhage. No mass effect or midline shift. No extra-axial fluid collection. Vascular: No hyperdense vessel or unexpected calcification. Skull: Normal. Negative for fracture or focal lesion. Sinuses/Orbits: No acute finding. Other: None CT CERVICAL SPINE FINDINGS Alignment: No acute subluxation. Grade 1 C4-C5 and T1-T2 anterolisthesis. Skull base and vertebrae: No acute fracture. Advanced osteopenia. There is incomplete bony fusion of the posterior ring of C1. Soft tissues and spinal canal: No prevertebral fluid or swelling. No visible canal hematoma. Disc levels: Multilevel degenerative changes and multilevel facet arthropathy. Upper chest: No acute findings. Other: Bilateral carotid bulb calcified plaques. IMPRESSION: 1. No acute intracranial pathology. Moderate age-related atrophy and chronic microvascular ischemic changes. 2. No acute/traumatic cervical spine pathology. Electronically Signed   By: Elgie Collard M.D.   On: 03/26/2023 17:09   DG Wrist Complete Right  Result Date: 03/26/2023 CLINICAL DATA:  Right wrist pain status post fall EXAM: RIGHT WRIST - COMPLETE 3+ VIEW COMPARISON:  None Available. FINDINGS: No acute fracture or dislocation. No aggressive osseous lesion. Normal alignment. Generalized osteopenia. Moderate osteoarthritis of the first MCP joint. Severe osteoarthritis of the first IP joint. Moderate osteoarthritis of the first MCP joint. Chondrocalcinosis of  the TFCC and scapholunate ligament as can be seen with CPPD. Soft tissue are unremarkable. No radiopaque foreign body or soft tissue emphysema. Peripheral vascular atherosclerotic disease. IMPRESSION: 1. No acute osseous injury of the right wrist. 2. Moderate osteoarthritis of the first MCP joint. Severe osteoarthritis of the first IP joint. Electronically Signed   By: Elige Ko M.D.   On: 03/26/2023 14:58   DG Knee Complete 4 Views Right  Result Date: 03/26/2023 CLINICAL DATA:  Status post fall, right knee pain EXAM: RIGHT KNEE - COMPLETE 4+ VIEW COMPARISON:  None Available. FINDINGS: Right knee demonstrates a total knee arthroplasty without evidence of hardware failure complication. No significant joint effusion. No fracture or dislocation. Alignment is anatomic. IMPRESSION: 1. Right total knee arthroplasty. 2.  No acute osseous injury of  the right knee. Electronically Signed   By: Elige Ko M.D.   On: 03/26/2023 14:57     I reviewed all nursing notes, pharmacy notes, vitals, pertinent old records.  Assessment/Plan Principal Problem:   Traumatic hematoma of knee   Traumatic hematoma of knee s/p fall X-ray right knee did not show any fracture Hold Eliquis for now Apply ice as needed Follow-up CT right knee for any fracture Follow PT and OT eval, continue fall precautions Ambulate with assistance  Right wrist wound s/p fall Follow-up wound care consult   AKI on CKD stage V Baseline creatinine 2.6 Creatinine 3.48 on admission Follow bladder scan with no urinary retention and renal sonogram Watch renal functions daily, started Lasix IV infusion due to chronic lymphedema   Chronic lymphedema bilateral lower extremities Started Lasix IV infusion Monitor renal functions daily and urine output Continue Unna boots  Paroxysmal A-fib, HTN, HLD, HFpEF Resumed Toprol-XL 50 mg p.o. daily, Imdur 15 mg nightly home dose Monitor BP and titrate medications accordingly   GERD continued  PPI   Iron deficiency anemia, transferrin saturation 10% Started Venofer 200 mg IV daily for 5 days Start oral iron supplement on discharge     Nutrition: Carb modified diet DVT Prophylaxis: SCD, pharmacological prophylaxis contraindicated due to Knee hematoma  Advance goals of care discussion: Full code   Consults: none  Family Communication: family was not present at bedside, at the time of interview.  Opportunity was given to ask question and all questions were answered satisfactorily.  Disposition: Admitted as inpatient, med-surge unit. Likely to be discharged SNF TBD after PT/OT eval, in 2-3 days.  I have discussed plan of care as described above with RN and patient/family.  Severity of Illness: The appropriate patient status for this patient is INPATIENT. Inpatient status is judged to be reasonable and necessary in order to provide the required intensity of service to ensure the patient's safety. The patient's presenting symptoms, physical exam findings, and initial radiographic and laboratory data in the context of their chronic comorbidities is felt to place them at high risk for further clinical deterioration. Furthermore, it is not anticipated that the patient will be medically stable for discharge from the hospital within 2 midnights of admission.   * I certify that at the point of admission it is my clinical judgment that the patient will require inpatient hospital care spanning beyond 2 midnights from the point of admission due to high intensity of service, high risk for further deterioration and high frequency of surveillance required.*   Author: Gillis Santa, MD [facility] 03/26/2023 7:01 PM   To reach On-call, see care teams to locate the attending and reach out to them via [email or web address]. If 7PM-7AM, please contact night-coverage If you still have difficulty reaching the attending provider, please page the [facility] (Director on Call) for [facility] on amion  for assistance.

## 2023-03-26 NOTE — Progress Notes (Signed)
Orthopedic Tech Progress Note Patient Details:  Tabitha Black Dec 10, 1932 [id number]  Ortho Devices Type of Ortho Device: Radio broadcast assistant Ortho Device/Splint Location: Bi LE Ortho Device/Splint Interventions: Application   Post Interventions Patient Tolerated: Well  [name] 03/26/2023, 4:43 PM

## 2023-03-26 NOTE — ED Notes (Signed)
Applied quick clot to right arm, pt tolerated well.

## 2023-03-26 NOTE — ED Provider Notes (Signed)
[place] [facility]PARTMENT AT [facility] Provider Note   CSN: [id number] Arrival date & time: 03/26/23  1308     History  Chief Complaint  Patient presents with  . Fall    Tabitha Black is a 89 y.o. female.  Patient with history of atrial fib on Eliquis, T2DM, HTN, HLD, osteoarthritis, GERD, CKD, GI bleed presents after fall at home this morning around 6:00 am. She reports trying to get into her recliner and fell after she tripped. She denies dizziness or chest pain prior to falling. She lives alone and lay on the floor for some time. She feels she hit her head but denies LOC. No nausea or vomiting. No SOB, cough or recent fever. She reports pain in her right knee and a wound on her right wrist. Denies other pain.   The history is provided by the patient. No language interpreter was used.  Fall      Home Medications Prior to Admission medications   Medication Sig Start Date End Date Taking? Authorizing Provider  acetaminophen (TYLENOL) 500 MG tablet Take 2 tablets (1,000 mg total) by mouth every 6 (six) hours. 01/14/23   Barnetta Chapel, PA-C  apixaban (ELIQUIS) 2.5 MG TABS tablet Take 1 tablet (2.5 mg total) by mouth 2 (two) times daily. 06/08/22   Wendall Stade, MD  Cholecalciferol (VITAMIN D3) 125 MCG (5000 UT) TABS Take 5,000 Units by mouth daily.    [provider]  doxazosin (CARDURA) 4 MG tablet Take 4 mg by mouth daily.    [provider]  furosemide (LASIX) 20 MG tablet Take 80 mg by mouth 2 (two) times daily.    [provider]  HYDROcodone-acetaminophen (NORCO/VICODIN) 5-325 MG tablet Take 1 tablet by mouth at bedtime.    [provider]  isosorbide mononitrate (IMDUR) 30 MG 24 hr tablet Take 1/2 tablet (15 mg) by mouth at bedtime. 10/06/22   Danford, Earl Lites, MD  metoprolol succinate (TOPROL-XL) 50 MG 24 hr tablet Take 1 tablet (50 mg total) by mouth every evening. Take with or immediately following a meal. 11/16/22    Willeen Niece, MD  Multiple Vitamins-Minerals (HAIR SKIN AND NAILS FORMULA) TABS Take 1 tablet by mouth daily.    [provider]  mupirocin ointment (BACTROBAN) 2 % Apply topically 2 (two) times daily. 01/04/23   Terrilee Files, MD  ondansetron (ZOFRAN-ODT) 4 MG disintegrating tablet Take 1 tablet (4 mg total) by mouth every 6 (six) hours as needed for nausea. 01/14/23   Marguerita Merles Latif, DO  pantoprazole (PROTONIX) 40 MG tablet Take 40 mg by mouth daily. 12/28/22   [provider]      Allergies    Other, Penicillins, Lovastatin, Azithromycin, Clindamycin, Hydralazine, Lisinopril, Neomycin sulfate [neomycin], Niacin, Rofecoxib, Tetracycline, Valsartan, Vytorin [ezetimibe-simvastatin], and Zetia [ezetimibe]    Review of Systems   Review of Systems  Physical Exam Updated Vital Signs BP 137/67   Pulse 91   Temp 98.3 F (36.8 C) (Oral)   Resp 17   Ht 5\' 3"  (1.6 m)   Wt 74.4 kg   SpO2 97%   BMI 29.05 kg/m  Physical Exam Vitals and nursing note reviewed.  Constitutional:      General: She is not in acute distress.    Appearance: Normal appearance. She is not ill-appearing.  HENT:     Head: Normocephalic and atraumatic.  Eyes:     Conjunctiva/sclera: Conjunctivae normal.  Neck:     Comments: No midline  cervical tenderness.  Cardiovascular:     Rate and Rhythm: Normal rate. Rhythm irregular.  Pulmonary:     Effort: Pulmonary effort is normal.     Breath sounds: No wheezing, rhonchi or rales.     Comments: No bruising of chest wall.  Chest:     Chest wall: No tenderness.  Abdominal:     Palpations: Abdomen is soft.     Tenderness: There is no abdominal tenderness.     Comments: No bruising of abdominal wall.   Musculoskeletal:     Cervical back: Normal range of motion and neck supple.     Comments: Right knee significantly swollen and ecchymotic. No bony deformity. Bilateral LE UNA bandages. No other bony tenderness of LE's. No pelvic tenderness.  FROM bilateral UE's including nontender shoulders, elbows, wrists. Bruising to right elbow.   Skin:    General: Skin is warm and dry.     Comments: Skin tear to right wrist.   Neurological:     Mental Status: She is alert and oriented to person, place, and time.     Sensory: No sensory deficit.  Psychiatric:        Mood and Affect: Mood normal.    ED Results / Procedures / Treatments   Labs (all labs ordered are listed, but only abnormal results are displayed) Labs Reviewed  CBC WITH DIFFERENTIAL/PLATELET - Abnormal; Notable for the following components:      Result Value   WBC 12.3 (*)    RBC 3.13 (*)    Hemoglobin 9.5 (*)    HCT 30.1 (*)    Neutro Abs 11.4 (*)    Lymphs Abs 0.4 (*)    All other components within normal limits  COMPREHENSIVE METABOLIC PANEL  URINALYSIS, ROUTINE W REFLEX MICROSCOPIC  CK    EKG None  Radiology DG Wrist Complete Right  Result Date: 03/26/2023 CLINICAL DATA:  Right wrist pain status post fall EXAM: RIGHT WRIST - COMPLETE 3+ VIEW COMPARISON:  None Available. FINDINGS: No acute fracture or dislocation. No aggressive osseous lesion. Normal alignment. Generalized osteopenia. Moderate osteoarthritis of the first MCP joint. Severe osteoarthritis of the first IP joint. Moderate osteoarthritis of the first MCP joint. Chondrocalcinosis of the TFCC and scapholunate ligament as can be seen with CPPD. Soft tissue are unremarkable. No radiopaque foreign body or soft tissue emphysema. Peripheral vascular atherosclerotic disease. IMPRESSION: 1. No acute osseous injury of the right wrist. 2. Moderate osteoarthritis of the first MCP joint. Severe osteoarthritis of the first IP joint. Electronically Signed   By: Elige Ko M.D.   On: 03/26/2023 14:58   DG Knee Complete 4 Views Right  Result Date: 03/26/2023 CLINICAL DATA:  Status post fall, right knee pain EXAM: RIGHT KNEE - COMPLETE 4+ VIEW COMPARISON:  None Available. FINDINGS: Right knee demonstrates a total  knee arthroplasty without evidence of hardware failure complication. No significant joint effusion. No fracture or dislocation. Alignment is anatomic. IMPRESSION: 1. Right total knee arthroplasty. 2.  No acute osseous injury of the right knee. Electronically Signed   By: Elige Ko M.D.   On: 03/26/2023 14:57    Procedures Procedures    Medications Ordered in ED Medications - No data to display  ED Course/ Medical Decision Making/ A&P Clinical Course as of 03/27/23 0651  Sat Mar 26, 2023  1439 Fall at home this morning that sound mechanical. Denies dizziness, syncope, chest pain, recent illness. She is anticoagulated. She reports falling in early morning and unable to get up  for hours. Reports pain in the right knee and wound to right wrist. She states she feels she did hit her head during the fall but no LOC. Will check labs, imaging. [SU]  1518 Patient care signed out to Huntley Dec, Pa-C, pending head CT And lab review. She will need assessment for ambulation to determine is she can be discharged home safely. She does have some in-home care but lives alone.  [SU]  1546 CBC with Differential(!) [AH]  1547 Hemoglobin(!): 9.5 [AH]  1547 Creatinine(!): 3.48 [AH]    Clinical Course User Index [AH] Arthor Captain, PA-C [SU] Elpidio Anis, PA-C                                 Medical Decision Making Amount and/or Complexity of Data Reviewed Labs: ordered. Decision-making details documented in ED Course. Radiology: ordered.  Risk Decision regarding hospitalization.           Final Clinical Impression(s) / ED Diagnoses Final diagnoses:  None    Rx / DC Orders ED Discharge Orders     None         Danne Harbor 03/26/23 1519    Kommor, Wyn Forster, MD 03/26/23 1837    Elpidio Anis, PA-C 03/27/23 0751    Glendora Score, MD 03/27/23 [phone]

## 2023-03-26 NOTE — ED Notes (Signed)
Pt to CT

## 2023-03-27 ENCOUNTER — Encounter (HOSPITAL_COMMUNITY): Payer: Self-pay | Admitting: Student

## 2023-03-27 DIAGNOSIS — I5032 Chronic diastolic (congestive) heart failure: Secondary | ICD-10-CM

## 2023-03-27 DIAGNOSIS — E1122 Type 2 diabetes mellitus with diabetic chronic kidney disease: Secondary | ICD-10-CM

## 2023-03-27 DIAGNOSIS — S8002XA Contusion of left knee, initial encounter: Secondary | ICD-10-CM | POA: Diagnosis not present

## 2023-03-27 DIAGNOSIS — R54 Age-related physical debility: Secondary | ICD-10-CM | POA: Diagnosis not present

## 2023-03-27 DIAGNOSIS — I10 Essential (primary) hypertension: Secondary | ICD-10-CM

## 2023-03-27 DIAGNOSIS — N185 Chronic kidney disease, stage 5: Secondary | ICD-10-CM | POA: Diagnosis not present

## 2023-03-27 DIAGNOSIS — L8990 Pressure ulcer of unspecified site, unspecified stage: Secondary | ICD-10-CM | POA: Insufficient documentation

## 2023-03-27 DIAGNOSIS — N184 Chronic kidney disease, stage 4 (severe): Secondary | ICD-10-CM

## 2023-03-27 LAB — COMPREHENSIVE METABOLIC PANEL
ALT: 15 U/L (ref 0–44)
AST: 37 U/L (ref 15–41)
Albumin: 2.9 g/dL — ABNORMAL LOW (ref 3.5–5.0)
Alkaline Phosphatase: 54 U/L (ref 38–126)
Anion gap: 18 — ABNORMAL HIGH (ref 5–15)
BUN: 80 mg/dL — ABNORMAL HIGH (ref 8–23)
CO2: 20 mmol/L — ABNORMAL LOW (ref 22–32)
Calcium: 9.4 mg/dL (ref 8.9–10.3)
Chloride: 100 mmol/L (ref 98–111)
Creatinine, Ser: 3.34 mg/dL — ABNORMAL HIGH (ref 0.44–1.00)
GFR, Estimated: 13 mL/min — ABNORMAL LOW (ref >60)
Glucose, Bld: 115 mg/dL — ABNORMAL HIGH (ref 70–99)
Potassium: 3.7 mmol/L (ref 3.5–5.1)
Sodium: 138 mmol/L (ref 135–145)
Total Bilirubin: 1 mg/dL (ref 0.3–1.2)
Total Protein: 6.1 g/dL — ABNORMAL LOW (ref 6.5–8.1)

## 2023-03-27 LAB — CBC
HCT: 25.2 % — ABNORMAL LOW (ref 36.0–46.0)
Hemoglobin: 8.1 g/dL — ABNORMAL LOW (ref 12.0–15.0)
MCH: 30.6 pg (ref 26.0–34.0)
MCHC: 32.1 g/dL (ref 30.0–36.0)
MCV: 95.1 fL (ref 80.0–100.0)
Platelets: 151 10*3/uL (ref 150–400)
RBC: 2.65 MIL/uL — ABNORMAL LOW (ref 3.87–5.11)
RDW: 14.2 % (ref 11.5–15.5)
WBC: 9.6 10*3/uL (ref 4.0–10.5)
nRBC: 0 % (ref 0.0–0.2)

## 2023-03-27 LAB — PROTIME-INR
INR: 1.5 — ABNORMAL HIGH (ref 0.8–1.2)
Prothrombin Time: 18.3 s — ABNORMAL HIGH (ref 11.4–15.2)

## 2023-03-27 LAB — PHOSPHORUS: Phosphorus: 5.4 mg/dL — ABNORMAL HIGH (ref 2.5–4.6)

## 2023-03-27 LAB — BRAIN NATRIURETIC PEPTIDE: B Natriuretic Peptide: 971.8 pg/mL — ABNORMAL HIGH (ref 0.0–100.0)

## 2023-03-27 LAB — CK: Total CK: 552 U/L — ABNORMAL HIGH (ref 38–234)

## 2023-03-27 LAB — MAGNESIUM: Magnesium: 1.7 mg/dL (ref 1.7–2.4)

## 2023-03-27 MED ORDER — FUROSEMIDE 10 MG/ML IJ SOLN
80.0000 mg | Freq: Two times a day (BID) | INTRAMUSCULAR | Status: DC
  Administered 2023-03-27 – 2023-03-29 (×4): 80 mg via INTRAVENOUS
  Filled 2023-03-27 (×4): qty 8

## 2023-03-27 MED ORDER — ACETAMINOPHEN 500 MG PO TABS
1000.0000 mg | ORAL_TABLET | Freq: Four times a day (QID) | ORAL | Status: DC
  Administered 2023-03-27 – 2023-03-30 (×12): 1000 mg via ORAL
  Filled 2023-03-27 (×13): qty 2

## 2023-03-27 MED ORDER — APIXABAN 2.5 MG PO TABS: 2.5000 mg | ORAL_TABLET | Freq: Two times a day (BID) | ORAL | Status: DC

## 2023-03-27 MED ORDER — DOXAZOSIN MESYLATE 4 MG PO TABS
4.0000 mg | ORAL_TABLET | Freq: Every day | ORAL | Status: DC
  Administered 2023-03-27 – 2023-03-30 (×4): 4 mg via ORAL
  Filled 2023-03-27 (×4): qty 1

## 2023-03-27 MED ORDER — VITAMIN D 25 MCG (1000 UNIT) PO TABS
5000.0000 [IU] | ORAL_TABLET | Freq: Every day | ORAL | Status: DC
  Administered 2023-03-27 – 2023-03-30 (×4): 5000 [IU] via ORAL
  Filled 2023-03-27 (×4): qty 5

## 2023-03-27 MED ORDER — HYDROCODONE-ACETAMINOPHEN 5-325 MG PO TABS
1.0000 | ORAL_TABLET | Freq: Every day | ORAL | Status: DC
  Administered 2023-03-27 – 2023-03-29 (×3): 1 via ORAL
  Filled 2023-03-27 (×3): qty 1

## 2023-03-27 NOTE — Progress Notes (Addendum)
Lasix gtt not running at time of morning assessment and current bag empty. Ordered STAT bag from pharmacy.

## 2023-03-27 NOTE — Progress Notes (Addendum)
PROGRESS NOTE    Tabitha Black  OZD:[id number] DOB: [birth year] DOA: 03/26/2023 PCP: Irven Coe, MD    Brief Narrative:  Tabitha Black is a 89 y.o. female with past medical history of paroxysmal A-fib on anticoagulation, hypertension, hyperlipidemia, HFpEF, CKD stage V, GERD, iron deficiency anemia, GI bleed, cervical radiculopathy, osteoarthritis, chronic bilateral lower extremity lymphedema on Unna boots, presented to hospital after sustaining a fall while getting out of her recliner since her legs just gave way. Patient got wound on her right wrist and was complaining of pain in the right knee.  In the ED vitals were stable.  Labs showed creatinine of 3.4 with albumin 3.4 CK was 656, had mild leukocytosis at 12.3.  Hemoglobin was low at 9.5.  CT head and C-spine was negative for any fractures.  X-ray of the right wrist and right knee without any fractures.  Patient was then considered for admission to hospital for further evaluation and treatment.  Assessment and plan.  Traumatic hematoma of right knee s/p fall Eliquis on hold.  X-ray of the right knee without any fracture.  Continue ice.  Large prepatellar hematoma measuring 12 x 1.7 x 11.3 cm with trace knee effusion.  Continue conservative treatment.  PT OT evaluation.  Likely need a skilled nursing facility placement.   Right wrist wound s/p fall Follow-up wound care consult.  Dressing in place.    AKI on CKD stage V Baseline creatinine 2.6.  Creatinine was 3.4 on admission.  Renal ultrasound shows interval development of mild right hydronephrosis.  Patient's daughter stated that that she has been having worsening lower extremity edema and wishes a nephrology evaluation.  Bladder scan revealed some urinary retention.  Will do In-N-Out cath.  If continues to have retention will get Foley catheter placed in..  On Lasix infusion.  Will change to 80 mg IV twice daily.  Patient takes AT p.o. twice daily at home.  Creatinine today at 3.3.  Will  put the patient on a strict intake and output charting Daily weights.  Will consult nephrology for evaluation.    Chronic lymphedema bilateral lower extremities Continue Unna boots.  On Lasix infusion, will change to IV Lasix.   Paroxysmal A-fib, HTN, HLD, HFpEF Continue Toprol and Imdur.    GERD continue PPI    Iron deficiency anemia, anemia of chronic kidney disease transferrin saturation 10%. Started Venofer 200 mg IV daily for 5 days.  Will get nephrology opinion      DVT prophylaxis: SCDs Start: 03/26/23 1853, continue to hold Eliquis for now.   Code Status:     Code Status: Full Code  Disposition: Likely to skilled nursing facility.  PT OT evaluation pending.  Wishes to go to [facility]  nursing home if possible.  Status is: Inpatient  Remains inpatient appropriate because: Status post fall, debility, IV diuretics   Family Communication: Spoke with the patient's daughter on the phone and updated her about the clinical condition of the patient.  She wishes nephrology evaluation.  Consultants:  Nephrology will inform.  Procedures:  None  Antimicrobials:  None  Anti-infectives (From admission, onward)    None       Subjective: Today, patient was seen and examined at bedside.  Has a mild wrist discomfort.  Denies any shortness of breath dyspnea chest pain.  Complains of mild knee pain.  Objective: Vitals:   03/26/23 2219 03/27/23 0031 03/27/23 0411 03/27/23 0749  BP: (!) 156/56 (!) 153/57 (!) 131/54 132/68  Pulse: 92 95  93 95  Resp: 18 20 20 18   Temp: 98.6 F (37 C) 98.2 F (36.8 C) 98.8 F (37.1 C) 98 F (36.7 C)  TempSrc: Oral Oral Oral   SpO2: 98% 98% 95% 95%  Weight: 75.9 kg     Height: 5\' 3"  (1.6 m)       Intake/Output Summary (Last 24 hours) at 03/27/2023 0931 Last data filed at 03/27/2023 0700 Gross per 24 hour  Intake 21.01 ml  Output --  Net 21.01 ml   Filed Weights   03/26/23 1322 03/26/23 2219  Weight: 74.4 kg 75.9 kg    Physical  Examination: Body mass index is 29.64 kg/m.   General:  Average built, not in obvious distress, on room air, elderly female HENT:   No scleral pallor or icterus noted. Oral mucosa is moist.  Chest:   Diminished breath sounds bilaterally. No crackles or wheezes.  CVS: S1 &S2 heard. No murmur.  Regular rate and rhythm. Abdomen: Soft, nontender, nondistended.  Bowel sounds are heard.   Extremities: No cyanosis, clubbing bilateral lower extremity Unna boot.  History of chronic lymphedema.  Left upper extremity with dressing.  Right knee with tenderness Psych: Alert, awake and oriented, normal mood CNS:  No cranial nerve deficits.  Moves extremities Skin: Warm and dry.  No rashes noted.  Data Reviewed:   CBC: Recent Labs  Lab 03/26/23 1356 03/27/23 0236  WBC 12.3* 9.6  NEUTROABS 11.4*  --   HGB 9.5* 8.1*  HCT 30.1* 25.2*  MCV 96.2 95.1  PLT 179 151    Basic Metabolic Panel: Recent Labs  Lab 03/26/23 1356 03/27/23 0236  NA 137 138  K 4.0 3.7  CL 102 100  CO2 20* 20*  GLUCOSE 127* 115*  BUN 77* 80*  CREATININE 3.48* 3.34*  CALCIUM 9.7 9.4  MG  --  1.7  PHOS  --  5.4*    Liver Function Tests: Recent Labs  Lab 03/26/23 1356 03/27/23 0236  AST 37 37  ALT 15 15  ALKPHOS 66 54  BILITOT 1.1 1.0  PROT 7.0 6.1*  ALBUMIN 3.4* 2.9*     Radiology Studies: US RENAL  Result Date: 03/26/2023 CLINICAL DATA:  Acute kidney injury EXAM: RENAL / URINARY TRACT ULTRASOUND COMPLETE COMPARISON:  CT 01/10/2023 FINDINGS: Right Kidney: Renal measurements: 7.6 x 3.8 x 4.7 cm = volume: 71 mL. Moderate diffuse cortical atrophy and diffusely increased renal cortical echogenicity noted in keeping with changes of underlying medical renal disease. The lower pole of the right kidney is obscured by overlying bowel gas. Mild right hydronephrosis has developed, new since prior examination. Left Kidney: Renal measurements: 7.5 x 4.3 x 4.0 cm = volume: 67 mL. Visualization of the left kidney is  limited by overlying bowel gas and osseous structures. The visualized left renal cortex, however, appears atrophic and is diffusely increased in echogenicity suggesting changes of underlying medical renal disease. No hydronephrosis. No intrarenal masses or calcifications within the visualized portion. Bladder: Appears normal for degree of bladder distention. Other: Incidentally noted is a 16 mm shadowing calculus within the proximal common bile duct. The duct appears moderately dilated measuring 12 mm in diameter. IMPRESSION: 1. Interval development of mild right hydronephrosis. 2. Limited visualization of the kidneys. Bilateral renal cortical atrophy and increased renal cortical echogenicity in keeping with changes of underlying medical renal disease. 3. 16 mm calculus within the proximal common bile duct with associated moderate biliary ductal dilatation. This can be confirmed with ERCP or MRCP examination. Electronically  Signed   By: Helyn Numbers M.D.   On: 03/26/2023 20:47   CT KNEE RIGHT WO CONTRAST  Result Date: 03/26/2023 CLINICAL DATA:  Right knee pain after fall EXAM: CT OF THE RIGHT KNEE WITHOUT CONTRAST TECHNIQUE: Multidetector CT imaging of the right knee was performed according to the standard protocol. Multiplanar CT image reconstructions were also generated. RADIATION DOSE REDUCTION: This exam was performed according to the departmental dose-optimization program which includes automated exposure control, adjustment of the mA and/or kV according to patient size and/or use of iterative reconstruction technique. COMPARISON:  X-ray 03/26/2023 FINDINGS: Bones/Joint/Cartilage Postsurgical changes from right total knee arthroplasty. Arthroplasty components are in their expected alignment. No periprosthetic lucency or fracture identified. Specifically, no patellar fracture. Trace knee joint effusion. No lipohemarthrosis. Ligaments Suboptimally assessed by CT. Muscles and Tendons No acute musculotendinous  abnormality by CT. Soft tissues Large hyperdense prepatellar hematoma measuring up to 12.0 x 1.7 x 11.3 cm (volume = 120 cm^3). Circumferential subcutaneous edema throughout the visualized right leg. IMPRESSION: 1. Large prepatellar hematoma measuring up to 12.0 x 1.7 x 11.3 cm (volume = 120 cc). 2. Postsurgical changes from right total knee arthroplasty. No fracture or evidence of hardware complication. 3. Trace knee joint effusion. No lipohemarthrosis. 4. Circumferential subcutaneous edema throughout the visualized right leg. Electronically Signed   By: Duanne Guess D.O.   On: 03/26/2023 20:27   CT Head Wo Contrast  Result Date: 03/26/2023 CLINICAL DATA:  Trauma and head injury. EXAM: CT HEAD WITHOUT CONTRAST CT CERVICAL SPINE WITHOUT CONTRAST TECHNIQUE: Multidetector CT imaging of the head and cervical spine was performed following the standard protocol without intravenous contrast. Multiplanar CT image reconstructions of the cervical spine were also generated. RADIATION DOSE REDUCTION: This exam was performed according to the departmental dose-optimization program which includes automated exposure control, adjustment of the mA and/or kV according to patient size and/or use of iterative reconstruction technique. COMPARISON:  None Available. FINDINGS: Evaluation of this exam is limited due to motion artifact. CT HEAD FINDINGS Brain: Moderate age-related atrophy and chronic microvascular ischemic changes. There is no acute intracranial hemorrhage. No mass effect or midline shift. No extra-axial fluid collection. Vascular: No hyperdense vessel or unexpected calcification. Skull: Normal. Negative for fracture or focal lesion. Sinuses/Orbits: No acute finding. Other: None CT CERVICAL SPINE FINDINGS Alignment: No acute subluxation. Grade 1 C4-C5 and T1-T2 anterolisthesis. Skull base and vertebrae: No acute fracture. Advanced osteopenia. There is incomplete bony fusion of the posterior ring of C1. Soft tissues  and spinal canal: No prevertebral fluid or swelling. No visible canal hematoma. Disc levels: Multilevel degenerative changes and multilevel facet arthropathy. Upper chest: No acute findings. Other: Bilateral carotid bulb calcified plaques. IMPRESSION: 1. No acute intracranial pathology. Moderate age-related atrophy and chronic microvascular ischemic changes. 2. No acute/traumatic cervical spine pathology. Electronically Signed   By: Elgie Collard M.D.   On: 03/26/2023 17:09   CT Cervical Spine Wo Contrast  Result Date: 03/26/2023 CLINICAL DATA:  Trauma and head injury. EXAM: CT HEAD WITHOUT CONTRAST CT CERVICAL SPINE WITHOUT CONTRAST TECHNIQUE: Multidetector CT imaging of the head and cervical spine was performed following the standard protocol without intravenous contrast. Multiplanar CT image reconstructions of the cervical spine were also generated. RADIATION DOSE REDUCTION: This exam was performed according to the departmental dose-optimization program which includes automated exposure control, adjustment of the mA and/or kV according to patient size and/or use of iterative reconstruction technique. COMPARISON:  None Available. FINDINGS: Evaluation of this exam is limited due to  motion artifact. CT HEAD FINDINGS Brain: Moderate age-related atrophy and chronic microvascular ischemic changes. There is no acute intracranial hemorrhage. No mass effect or midline shift. No extra-axial fluid collection. Vascular: No hyperdense vessel or unexpected calcification. Skull: Normal. Negative for fracture or focal lesion. Sinuses/Orbits: No acute finding. Other: None CT CERVICAL SPINE FINDINGS Alignment: No acute subluxation. Grade 1 C4-C5 and T1-T2 anterolisthesis. Skull base and vertebrae: No acute fracture. Advanced osteopenia. There is incomplete bony fusion of the posterior ring of C1. Soft tissues and spinal canal: No prevertebral fluid or swelling. No visible canal hematoma. Disc levels: Multilevel degenerative  changes and multilevel facet arthropathy. Upper chest: No acute findings. Other: Bilateral carotid bulb calcified plaques. IMPRESSION: 1. No acute intracranial pathology. Moderate age-related atrophy and chronic microvascular ischemic changes. 2. No acute/traumatic cervical spine pathology. Electronically Signed   By: Elgie Collard M.D.   On: 03/26/2023 17:09   DG Wrist Complete Right  Result Date: 03/26/2023 CLINICAL DATA:  Right wrist pain status post fall EXAM: RIGHT WRIST - COMPLETE 3+ VIEW COMPARISON:  None Available. FINDINGS: No acute fracture or dislocation. No aggressive osseous lesion. Normal alignment. Generalized osteopenia. Moderate osteoarthritis of the first MCP joint. Severe osteoarthritis of the first IP joint. Moderate osteoarthritis of the first MCP joint. Chondrocalcinosis of the TFCC and scapholunate ligament as can be seen with CPPD. Soft tissue are unremarkable. No radiopaque foreign body or soft tissue emphysema. Peripheral vascular atherosclerotic disease. IMPRESSION: 1. No acute osseous injury of the right wrist. 2. Moderate osteoarthritis of the first MCP joint. Severe osteoarthritis of the first IP joint. Electronically Signed   By: Elige Ko M.D.   On: 03/26/2023 14:58   DG Knee Complete 4 Views Right  Result Date: 03/26/2023 CLINICAL DATA:  Status post fall, right knee pain EXAM: RIGHT KNEE - COMPLETE 4+ VIEW COMPARISON:  None Available. FINDINGS: Right knee demonstrates a total knee arthroplasty without evidence of hardware failure complication. No significant joint effusion. No fracture or dislocation. Alignment is anatomic. IMPRESSION: 1. Right total knee arthroplasty. 2.  No acute osseous injury of the right knee. Electronically Signed   By: Elige Ko M.D.   On: 03/26/2023 14:57      LOS: 1 day     Joycelyn Das, MD [facility] Available via Epic secure chat 7am-7pm After these hours, please refer to coverage provider listed on [email or web address] 03/27/2023,  9:31 AM

## 2023-03-27 NOTE — Plan of Care (Signed)

## 2023-03-27 NOTE — Evaluation (Signed)
Occupational Therapy Evaluation Patient Details Name: Tabitha Black MRN: [id number] DOB: 04-10-1933 Today's Date: 03/27/2023   History of Present Illness 89 y.o. female with Past medical history of paroxysmal A-fib on DOAC, HTN, HLD, HFpEF, CKD stage V, GERD, iron deficiency anemia, GI bleed, cervical radiculopathy, osteoarthritis, chronic bilateral lower extremity lymphedema on Unna boots, as reviewed from EMR, presented at [facility], ED with complaining of fall while getting out of her recliner.  As per patient she was trying to get out of her recliner and she fell on the floor, her legs just gave way, denied LOC, no headache or dizziness, no lightheadedness, no chest pain or palpitations.  Patient sustained a wound to her right wrist and was complaining of pain in the right knee.   Clinical Impression   Pt admitted with the above diagnosi. Pt currently with functional limitations due to the deficits listed below (see OT Problem List). Prior to admit, pt was living at home alone and completing BADL tasks at Mod I level. Family available to provide assistance PRN.  Pt will benefit from acute skilled OT to increase their safety and independence with ADL and functional mobility for ADL to facilitate discharge. Patient will benefit from continued inpatient follow up therapy, <3 hours/day. OT will continue to follow patient acutely.          If plan is discharge home, recommend the following: A little help with walking and/or transfers;A lot of help with bathing/dressing/bathroom;Help with stairs or ramp for entrance;Assistance with cooking/housework;Assist for transportation    Functional Status Assessment  Patient has had a recent decline in their functional status and demonstrates the ability to make significant improvements in function in a reasonable and predictable amount of time.  Equipment Recommendations  None recommended by OT       Precautions / Restrictions Precautions Precautions:  Fall Precaution Comments: BLE lymphedema with unna boots ordered Restrictions Weight Bearing Restrictions: No      Mobility Bed Mobility Overal bed mobility: Needs Assistance Bed Mobility: Supine to Sit     Supine to sit: HOB elevated, Used rails, Mod assist, +2 for physical assistance     General bed mobility comments: Mod A x1 to assist with trunk and bed pad to scoot hips towards EOB in order to sit. 1 person provided support to RLE to maintain knee extension. Once seated EOB, foot was slowly lowered to ground and pt was able to tolerate sitting position. VC provided for sequencing.    Transfers Overall transfer level: Needs assistance Equipment used: Rolling walker (2 wheels) Transfers: Sit to/from Stand, Bed to chair/wheelchair/BSC Sit to Stand: Min assist, +2 physical assistance, From elevated surface           General transfer comment: VC for hand placement with RW management. Once standing, pt was able to walk forward with 1 person assist and RW. Recliner was brought behind patient to allow her to sit. Did not perform a step pivot transfer although I believe she could do so without much difficulty.      Balance Overall balance assessment: Needs assistance, History of Falls Sitting-balance support: No upper extremity supported, Feet supported Sitting balance-Leahy Scale: Fair Sitting balance - Comments: sitting EOB   Standing balance support: Bilateral upper extremity supported, During functional activity, Reliant on assistive device for balance Standing balance-Leahy Scale: Poor Standing balance comment: relied on external support for balance          ADL either performed or assessed with clinical judgement  ADL Overall ADL's : Needs assistance/impaired     Grooming: Wash/dry face;Bed level;Set up   Upper Body Bathing: Minimal assistance;Sitting   Lower Body Bathing: Total assistance;Sit to/from stand   Upper Body Dressing : Minimal assistance;Sitting    Lower Body Dressing: Total assistance;Bed level Lower Body Dressing Details (indicate cue type and reason): socks donned Toilet Transfer: Moderate assistance;Cueing for sequencing;Cueing for safety;Ambulation;BSC/3in1;Rolling walker (2 wheels)       Tub/ Shower Transfer: Total assistance;Rolling walker (2 wheels);Cueing for safety;Cueing for sequencing           Vision Baseline Vision/History: 1 Wears glasses (glasses at home and not with patient) Ability to See in Adequate Light: 0 Adequate Patient Visual Report: No change from baseline Vision Assessment?: No apparent visual deficits     Perception Perception: Not tested       Praxis Praxis: Not tested       Pertinent Vitals/Pain Pain Assessment Pain Assessment: Faces Faces Pain Scale: Hurts even more Pain Location: right knee during bed mobility and at end of session Pain Descriptors / Indicators: Aching, Grimacing, Discomfort Pain Intervention(s): Monitored during session, Repositioned, Ice applied, Premedicated before session     Extremity/Trunk Assessment Upper Extremity Assessment Upper Extremity Assessment: Generalized weakness;Right hand dominant;RUE deficits/detail RUE Deficits / Details: skin tear with bandage applied-wound care order in place.   Lower Extremity Assessment Lower Extremity Assessment: Defer to PT evaluation;RLE deficits/detail RLE Deficits / Details: Large hematoma at knee with limited ROM and increased pain.   Cervical / Trunk Assessment Cervical / Trunk Assessment: Kyphotic;Other exceptions Cervical / Trunk Exceptions: forward head, rounded shoulders   Communication Communication Communication: Hearing impairment Cueing Techniques: Verbal cues   Cognition Arousal: Alert Behavior During Therapy: WFL for tasks assessed/performed Overall Cognitive Status: Within Functional Limits for tasks assessed         General Comments  VSS on RA. Large hematoma on right knee present. At end of  session, RLE was elevated and 2 ice packs were applied.            Home Living Family/patient expects to be discharged to:: Private residence Living Arrangements: Alone Available Help at Discharge: Family;Available PRN/intermittently Type of Home: House Home Access: Ramped entrance     Home Layout: One level     Bathroom Shower/Tub: Producer, television/film/video: Standard     Home Equipment: Toilet riser;Shower seat;Grab bars - toilet;Grab bars - tub/shower;Other (comment);Rolling Walker (2 wheels)   Additional Comments: Sleeps in lift chair; does not use lift feature of chair (very much); has a life alert-type service      Prior Functioning/Environment Prior Level of Function : Independent/Modified Independent             Mobility Comments: uses RW ADLs Comments: ind; family assists with transportation and shopping        OT Problem List: Decreased strength;Pain;Decreased range of motion;Decreased activity tolerance;Impaired balance (sitting and/or standing);Decreased knowledge of use of DME or AE      OT Treatment/Interventions: Self-care/ADL training;Therapeutic exercise;Therapeutic activities;Energy conservation;DME and/or AE instruction;Patient/family education;Balance training;Manual therapy;Modalities    OT Goals(Current goals can be found in the care plan section) Acute Rehab OT Goals Patient Stated Goal: to get stronger and go home OT Goal Formulation: Patient unable to participate in goal setting Time For Goal Achievement: 04/10/23 Potential to Achieve Goals: Good  OT Frequency: Min 1X/week    Co-evaluation PT/OT/SLP Co-Evaluation/Treatment: Yes Reason for Co-Treatment: To address functional/ADL transfers   OT goals addressed during session:  ADL's and self-care;Strengthening/ROM;Proper use of Adaptive equipment and DME      AM-PAC OT "6 Clicks" Daily Activity     Outcome Measure Help from another person eating meals?: None Help from another  person taking care of personal grooming?: A Little Help from another person toileting, which includes using toliet, bedpan, or urinal?: Total Help from another person bathing (including washing, rinsing, drying)?: Total Help from another person to put on and taking off regular upper body clothing?: A Little Help from another person to put on and taking off regular lower body clothing?: Total 6 Click Score: 13   End of Session Equipment Utilized During Treatment: Gait belt;Rolling walker (2 wheels)  Activity Tolerance: Patient tolerated treatment well Patient left: in chair;with call bell/phone within reach;with chair alarm set;with family/visitor present  OT Visit Diagnosis: Unsteadiness on feet (R26.81);Muscle weakness (generalized) (M62.81);Pain;History of falling (Z91.81) Pain - Right/Left: Right Pain - part of body: Knee                Time: 4132-4401 OT Time Calculation (min): 39 min Charges:  OT General Charges $OT Visit: 1 Visit OT Evaluation $OT Eval Moderate Complexity: 1 Mod OT Treatments $Therapeutic Activity: 8-22 mins  Limmie Patricia, OTR/L,CBIS  Supplemental OT - [facility] and [facility] Secure Chat Preferred    Earnestine Shipp, Charisse March 03/27/2023, 4:14 PM

## 2023-03-27 NOTE — Consult Note (Signed)
WOC Nurse Consult Note: Reason for Consult: right arm wound Patient fell at home, abrasions/skin tear right forearm. Quick clot used in ED Asked floor nursing about strike through, or current status. No update. Will add orders for topical care Wound type: skin tear Pressure Injury POA: NA Measurement:see nursing flow sheet Wound bed:see nursing flow sheet Drainage (amount, consistency, odor) none documented  Periwound: intact  Dressing procedure/placement/frequency: Single layer of xeroform gauze and dry dressing. Change every other day. Per nursing skin care set.   Re consult if needed, will not follow at this time. Thanks  Micheala Morissette [employer], RN,CWOCN, CNS, CWON-AP [phone])

## 2023-03-27 NOTE — Evaluation (Signed)
Physical Therapy Evaluation Patient Details Name: Tabitha Black MRN: [id number] DOB: Jun 29, 1933 Today's Date: 03/27/2023  History of Present Illness  89 y.o. female with Past medical history of paroxysmal A-fib on DOAC, HTN, HLD, HFpEF, CKD stage V, GERD, iron deficiency anemia, GI bleed, cervical radiculopathy, osteoarthritis, chronic bilateral lower extremity lymphedema on Unna boots, as reviewed from EMR, presented at [facility], ED with complaining of fall while getting out of her recliner.  As per patient she was trying to get out of her recliner and she fell on the floor, her legs just gave way, denied LOC, no headache or dizziness, no lightheadedness, no chest pain or palpitations.  Patient sustained a wound to her right wrist and was complaining of pain in the right knee.  Clinical Impression   Pt admitted with above diagnosis. Lives at home alone, in a single-level home with one steps to enter; Prior to admission, pt was able to overall manage modified independently, using cane/RW, sleeps in a lift chair; fell while getting to her lift chair; Presents to PT with bil LE pain limiting activity tolerance, functional dependencies; Patient will benefit from continued inpatient follow up therapy, <3 hours/day;  Pt currently with functional limitations due to the deficits listed below (see PT Problem List). Pt will benefit from skilled PT to increase their independence and safety with mobility to allow discharge to the venue listed below.           If plan is discharge home, recommend the following: A lot of help with walking and/or transfers;A lot of help with bathing/dressing/bathroom   Can travel by private vehicle   No    Equipment Recommendations None recommended by PT (pretty well-equipped)  Recommendations for Other Services       Functional Status Assessment Patient has had a recent decline in their functional status and demonstrates the ability to make significant improvements in  function in a reasonable and predictable amount of time.     Precautions / Restrictions Precautions Precautions: Fall Precaution Comments: BLE lymphedema with unna boots ordered Restrictions Weight Bearing Restrictions: No      Mobility  Bed Mobility Overal bed mobility: Needs Assistance Bed Mobility: Supine to Sit     Supine to sit: HOB elevated, Used rails, Mod assist, +2 for physical assistance     General bed mobility comments: Mod A x1 to assist with trunk and bed pad to scoot hips towards EOB in order to sit. 1 person provided support to RLE to maintain knee extension. Once seated EOB, foot was slowly lowered to ground and pt was able to tolerate sitting position. VC provided for sequencing.    Transfers Overall transfer level: Needs assistance Equipment used: Rolling walker (2 wheels) Transfers: Sit to/from Stand, Bed to chair/wheelchair/BSC Sit to Stand: Min assist, +2 physical assistance, From elevated surface           General transfer comment: VC for hand placement with RW management. Once standing, pt was able to walk forward with 1 person assist and RW. Recliner was brought behind patient to allow her to sit. Did not perform a step pivot transfer although I believe she could do so without much difficulty.    Ambulation/Gait Ambulation/Gait assistance: Contact guard assist, +2 safety/equipment (initail chair follow) Gait Distance (Feet): 6 Feet Assistive device: Rolling walker (2 wheels) Gait Pattern/deviations: Decreased step length - right, Decreased step length - left, Trunk flexed       General Gait Details: Cues for step sequence adn RW  use/proximity  Stairs            Wheelchair Mobility     Tilt Bed    Modified Rankin (Stroke Patients Only)       Balance Overall balance assessment: Needs assistance, History of Falls Sitting-balance support: No upper extremity supported, Feet supported Sitting balance-Leahy Scale: Fair Sitting  balance - Comments: sitting EOB   Standing balance support: Bilateral upper extremity supported, During functional activity, Reliant on assistive device for balance Standing balance-Leahy Scale: Poor Standing balance comment: relied on external support for balance                             Pertinent Vitals/Pain      Home Living Family/patient expects to be discharged to:: Private residence Living Arrangements: Alone Available Help at Discharge: Family;Available PRN/intermittently Type of Home: House Home Access: Ramped entrance       Home Layout: One level Home Equipment: Toilet riser;Shower seat;Grab bars - toilet;Grab bars - tub/shower;Other (comment);Rolling Walker (2 wheels) Additional Comments: Sleeps in lift chair; does not use lift feature of chair (very much); has a life alert-type service    Prior Function Prior Level of Function : Independent/Modified Independent             Mobility Comments: uses RW ADLs Comments: ind; family assists with transportation and shopping     Extremity/Trunk Assessment   Upper Extremity Assessment Upper Extremity Assessment: Generalized weakness;Right hand dominant;RUE deficits/detail RUE Deficits / Details: skin tear with bandage applied-wound care order in place.    Lower Extremity Assessment Lower Extremity Assessment: RLE deficits/detail RLE Deficits / Details: Large hematoma at knee with limited ROM and increased pain.    Cervical / Trunk Assessment Cervical / Trunk Assessment: Kyphotic;Other exceptions Cervical / Trunk Exceptions: forward head, rounded shoulders  Communication   Communication Communication: Hearing impairment Cueing Techniques: Verbal cues  Cognition                                                General Comments General comments (skin integrity, edema, etc.): VSS on RA. Large hematoma on right knee present. At end of session, RLE was elevated and 2 ice packs were  applied.    Exercises     Assessment/Plan    PT Assessment Patient needs continued PT services  PT Problem List Decreased range of motion;Decreased strength;Decreased activity tolerance;Decreased balance;Decreased mobility;Decreased coordination;Decreased cognition;Decreased knowledge of use of DME;Decreased safety awareness;Decreased knowledge of precautions;Pain       PT Treatment Interventions DME instruction;Gait training;Functional mobility training;Therapeutic activities;Therapeutic exercise;Balance training;Neuromuscular re-education;Cognitive remediation;Patient/family education    PT Goals (Current goals can be found in the Care Plan section)  Acute Rehab PT Goals Patient Stated Goal: be able to safely get home PT Goal Formulation: With patient Time For Goal Achievement: 04/10/23 Potential to Achieve Goals: Good    Frequency Min 1X/week     Co-evaluation   Reason for Co-Treatment: To address functional/ADL transfers   OT goals addressed during session: ADL's and self-care;Strengthening/ROM;Proper use of Adaptive equipment and DME       AM-PAC PT "6 Clicks" Mobility  Outcome Measure Help needed turning from your back to your side while in a flat bed without using bedrails?: A Little Help needed moving from lying on your back to sitting on the side of  a flat bed without using bedrails?: A Little Help needed moving to and from a bed to a chair (including a wheelchair)?: A Lot Help needed standing up from a chair using your arms (e.g., wheelchair or bedside chair)?: A Lot Help needed to walk in hospital room?: A Lot Help needed climbing 3-5 steps with a railing? : A Lot 6 Click Score: 14    End of Session Equipment Utilized During Treatment: Gait belt Activity Tolerance: Patient tolerated treatment well Patient left: in chair;with call bell/phone within reach;with family/visitor present Nurse Communication: Mobility status PT Visit Diagnosis: Other abnormalities  of gait and mobility (R26.89);History of falling (Z91.81)    Time: 1610-9604 PT Time Calculation (min) (ACUTE ONLY): 36 min   Charges:   PT Evaluation $PT Eval Moderate Complexity: 1 Mod   PT General Charges $$ ACUTE PT VISIT: 1 Visit         Van Clines, PT  Acute Rehabilitation Services Office [phone] Secure Chat welcomed;  Levi Aland 03/27/2023, 6:19 PM

## 2023-03-27 NOTE — Hospital Course (Addendum)
Tabitha Black is a 89 y.o. female with past medical history of paroxysmal A-fib on anticoagulation, hypertension, hyperlipidemia, HFpEF, CKD stage V, GERD, iron deficiency anemia, GI bleed, cervical radiculopathy, osteoarthritis, chronic bilateral lower extremity lymphedema on Unna boots, presented to hospital after sustaining a fall while getting out of her recliner since her legs just gave way. Patient got wound on her right wrist and was complaining of pain in the right knee.  In the ED vitals were stable.  Labs showed creatinine of 3.4 with albumin 3.4 CK was 656, had mild leukocytosis at 12.3.  Hemoglobin was low at 9.5.  CT head and C-spine was negative for any fractures.  X-ray of the right wrist and right knee without any fractures.  Patient was then considered for admission to hospital for further evaluation and treatment.  Assessment and plan.  Traumatic hematoma of knee s/p fall Eliquis on hold.  X-ray of the knee without any fracture.  Continue ice.  Large prepatellar hematoma measuring 12 x 1.7 x 11.3 cm with trace knee effusion.  Continue conservative treatment.  PT OT evaluation.   Right wrist wound s/p fall Follow-up wound care consult    AKI on CKD stage V Baseline creatinine 2.6.  Creatinine was 3.4 on admission.  Renal ultrasound shows interval development of mild right hydronephrosis.  Will continue bladder scan.  On Lasix infusion.  Will change to 80 mg IV twice daily.  Patient takes AT p.o. twice daily at home.  Creatinine today at 3.3.  Will put the patient on a strict intake and output charting Daily weights.    Chronic lymphedema bilateral lower extremities Continue Unna boots.  On Lasix infusion, will change to IV Lasix.   Paroxysmal A-fib, HTN, HLD, HFpEF Continue Toprol and Imdur.    GERD continue PPI    Iron deficiency anemia, transferrin saturation 10% Started Venofer 200 mg IV daily for 5 days.  oral iron supplement on discharge

## 2023-03-27 NOTE — Progress Notes (Signed)
03/26/2023 2210 Received pt to room 6N-02 from ED, S/P fall at home.  Pt is A&Ox4, C/O some pain.  Oriented to room, call light and bed.  Call bell in reach, bed alarm on. Kathryne Hitch

## 2023-03-28 DIAGNOSIS — N185 Chronic kidney disease, stage 5: Secondary | ICD-10-CM | POA: Diagnosis not present

## 2023-03-28 DIAGNOSIS — R54 Age-related physical debility: Secondary | ICD-10-CM | POA: Diagnosis not present

## 2023-03-28 DIAGNOSIS — E782 Mixed hyperlipidemia: Secondary | ICD-10-CM

## 2023-03-28 DIAGNOSIS — I5032 Chronic diastolic (congestive) heart failure: Secondary | ICD-10-CM | POA: Diagnosis not present

## 2023-03-28 DIAGNOSIS — S8002XA Contusion of left knee, initial encounter: Secondary | ICD-10-CM | POA: Diagnosis not present

## 2023-03-28 LAB — CBC
HCT: 25.6 % — ABNORMAL LOW (ref 36.0–46.0)
Hemoglobin: 8.2 g/dL — ABNORMAL LOW (ref 12.0–15.0)
MCH: 31.7 pg (ref 26.0–34.0)
MCHC: 32 g/dL (ref 30.0–36.0)
MCV: 98.8 fL (ref 80.0–100.0)
Platelets: 135 10*3/uL — ABNORMAL LOW (ref 150–400)
RBC: 2.59 MIL/uL — ABNORMAL LOW (ref 3.87–5.11)
RDW: 14.2 % (ref 11.5–15.5)
WBC: 6.3 10*3/uL (ref 4.0–10.5)
nRBC: 0 % (ref 0.0–0.2)

## 2023-03-28 LAB — BASIC METABOLIC PANEL
Anion gap: 14 (ref 5–15)
BUN: 82 mg/dL — ABNORMAL HIGH (ref 8–23)
CO2: 21 mmol/L — ABNORMAL LOW (ref 22–32)
Calcium: 8.8 mg/dL — ABNORMAL LOW (ref 8.9–10.3)
Chloride: 103 mmol/L (ref 98–111)
Creatinine, Ser: 3.32 mg/dL — ABNORMAL HIGH (ref 0.44–1.00)
GFR, Estimated: 13 mL/min — ABNORMAL LOW (ref >60)
Glucose, Bld: 95 mg/dL (ref 70–99)
Potassium: 3.3 mmol/L — ABNORMAL LOW (ref 3.5–5.1)
Sodium: 138 mmol/L (ref 135–145)

## 2023-03-28 LAB — CK: Total CK: 360 U/L — ABNORMAL HIGH (ref 38–234)

## 2023-03-28 LAB — MAGNESIUM: Magnesium: 1.7 mg/dL (ref 1.7–2.4)

## 2023-03-28 MED ORDER — DARBEPOETIN ALFA 60 MCG/0.3ML IJ SOSY
60.0000 ug | PREFILLED_SYRINGE | Freq: Once | INTRAMUSCULAR | Status: AC
  Administered 2023-03-28: 60 ug via SUBCUTANEOUS
  Filled 2023-03-28: qty 0.3

## 2023-03-28 MED ORDER — POTASSIUM CHLORIDE CRYS ER 20 MEQ PO TBCR
40.0000 meq | EXTENDED_RELEASE_TABLET | Freq: Every day | ORAL | Status: DC
  Administered 2023-03-28: 40 meq via ORAL
  Filled 2023-03-28: qty 2

## 2023-03-28 MED ORDER — CHLORHEXIDINE GLUCONATE CLOTH 2 % EX PADS
6.0000 | MEDICATED_PAD | Freq: Every day | CUTANEOUS | Status: DC
  Administered 2023-03-28 – 2023-03-30 (×3): 6 via TOPICAL

## 2023-03-28 MED ORDER — POTASSIUM CHLORIDE CRYS ER 20 MEQ PO TBCR
40.0000 meq | EXTENDED_RELEASE_TABLET | Freq: Once | ORAL | Status: AC
  Administered 2023-03-28: 40 meq via ORAL
  Filled 2023-03-28: qty 2

## 2023-03-28 NOTE — Progress Notes (Signed)
Physical Therapy Treatment Patient Details Name: Tabitha Black MRN: [id number] DOB: April 03, 1933 Today's Date: 03/28/2023   History of Present Illness 89 y.o. female with Past medical history of paroxysmal A-fib on DOAC, HTN, HLD, HFpEF, CKD stage V, GERD, iron deficiency anemia, GI bleed, cervical radiculopathy, osteoarthritis, chronic bilateral lower extremity lymphedema on Unna boots, as reviewed from EMR, presented at [facility], ED with complaining of fall while getting out of her recliner.  As per patient she was trying to get out of her recliner and she fell on the floor, her legs just gave way, denied LOC, no headache or dizziness, no lightheadedness, no chest pain or palpitations.  Patient sustained a wound to her right wrist and was complaining of pain in the right knee.    PT Comments  Pt is progressing well towards goals. Pt was one person assist today with Mod a for bed mobility (pt sleeps in recliner at home) and Mod a for sit to stand. Pt was Min A for step pivot transfer from EOB to recliner using RW with verbal cues for sequencing to decrease WB on the RLE for pain modulation. Due to pt current functional status, home se tup and available assistance at home recommending skilled physical therapy services < 3 hours/day 5 days/week in order to address strength, balance, pain and funcitonal mobility to decrease risk for falls, injury and re-hospitalization.     If plan is discharge home, recommend the following: A little help with walking and/or transfers;Assist for transportation;Assistance with cooking/housework;Help with stairs or ramp for entrance   Can travel by private vehicle     No  Equipment Recommendations  None recommended by PT       Precautions / Restrictions Precautions Precautions: Fall Precaution Comments: BLE lymphedema with unna boots ordered, going to [facility] lymphedema clinic hopefully after discharge Restrictions Weight Bearing Restrictions: No     Mobility  Bed  Mobility Overal bed mobility: Needs Assistance Bed Mobility: Supine to Sit     Supine to sit: HOB elevated, Used rails, Mod assist     General bed mobility comments: Mod A to scoot to EOB pt able to perform 50% on her own. Pt did not need assist lowering the RLE this session.    Transfers Overall transfer level: Needs assistance Equipment used: Rolling walker (2 wheels) Transfers: Sit to/from Stand, Bed to chair/wheelchair/BSC Sit to Stand: Mod assist   Step pivot transfers: Min assist       General transfer comment: VC for hand placement and bringing COM over BOS with forward flexion. Pt stood 2x from EOB. After second attempt took small steps to recliner from EOB for step pivot transfer at MIn A with RW with assist for positioning, verbal cues for sequencing and Min A for balance.    Ambulation/Gait     General Gait Details: Did not progress this session due to fatigue after standing EOB for pericare.        Balance Overall balance assessment: Needs assistance, History of Falls Sitting-balance support: No upper extremity supported, Feet supported Sitting balance-Leahy Scale: Fair Sitting balance - Comments: sitting EOB   Standing balance support: Bilateral upper extremity supported, During functional activity, Reliant on assistive device for balance Standing balance-Leahy Scale: Poor Standing balance comment: relied on external support for balance      Cognition Arousal: Alert Behavior During Therapy: WFL for tasks assessed/performed Overall Cognitive Status: Within Functional Limits for tasks assessed           General  Comments General comments (skin integrity, edema, etc.): Pt had smears of BM on bed pad, when pt was wiped BM started coming out and pt was unaware that she was experiencing a BM. Pt stood for ~3 min to allow assistance with clean up and was very fatigued.      Pertinent Vitals/Pain Pain Assessment Pain Assessment: Faces Faces Pain Scale:  Hurts even more Pain Location: right knee during mobility and with initial knee flexion Pain Descriptors / Indicators: Aching, Grimacing, Discomfort Pain Intervention(s): Monitored during session, Limited activity within patient's tolerance           PT Goals (current goals can now be found in the care plan section) Acute Rehab PT Goals Patient Stated Goal: be able to safely get home PT Goal Formulation: With patient Time For Goal Achievement: 04/10/23 Potential to Achieve Goals: Good Progress towards PT goals: Progressing toward goals    Frequency    Min 1X/week      PT Plan  Continue with current POC       AM-PAC PT "6 Clicks" Mobility   Outcome Measure  Help needed turning from your back to your side while in a flat bed without using bedrails?: A Little Help needed moving from lying on your back to sitting on the side of a flat bed without using bedrails?: A Lot Help needed moving to and from a bed to a chair (including a wheelchair)?: A Lot Help needed standing up from a chair using your arms (e.g., wheelchair or bedside chair)?: A Lot Help needed to walk in hospital room?: A Lot Help needed climbing 3-5 steps with a railing? : A Lot 6 Click Score: 13    End of Session Equipment Utilized During Treatment: Gait belt Activity Tolerance: Patient tolerated treatment well;Patient limited by fatigue;Patient limited by pain Patient left: in chair;with call bell/phone within reach;with family/visitor present;with nursing/sitter in room Nurse Communication: Mobility status PT Visit Diagnosis: Other abnormalities of gait and mobility (R26.89);History of falling (Z91.81)     Time: 1660-6301 PT Time Calculation (min) (ACUTE ONLY): 25 min  Charges:    $Therapeutic Activity: 23-37 mins PT General Charges $$ ACUTE PT VISIT: 1 Visit                    Harrel Carina, DPT, CLT  Acute Rehabilitation Services Office: [phone] (Secure chat preferred)    Claudia Desanctis 03/28/2023, 10:11 AM

## 2023-03-28 NOTE — [place] FL2 (Signed)
[place] MEDICAID FL2 LEVEL OF CARE FORM     IDENTIFICATION  Patient Name: Tabitha Black Birthdate: 09/10/1932 Sex: female Admission Date (Current Location): 03/26/2023  [facility] and [place] Number:  Producer, television/film/video and Address:  [facility] [place], 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   Abdominal pain 01/10/2023   GI bleeding 11/12/2022   Cellulitis of right leg 11/12/2022   Lymphedema of both lower extremities - R > L 10/04/2022   HTN (hypertension) 08/23/2022   Persistent atrial fibrillation (HCC) 08/23/2022   DM2 (diabetes mellitus, type 2) (HCC) 08/23/2022   Pain in joint of left knee 01/06/2021   Adverse effect of antihyperlipidemic and antiarteriosclerotic drugs, initial encounter 08/22/2020   Age-related physical debility 08/22/2020   (HFpEF) heart failure with preserved ejection fraction (HCC) 08/22/2020   Chronic kidney disease (CKD), active medical management without dialysis, stage 5 (HCC) 08/22/2020   Diabetic renal disease (HCC) 08/22/2020   Gastroesophageal reflux disease 08/22/2020   History of malignant neoplasm of skin 08/22/2020   Iron deficiency anemia 08/22/2020   Mixed hyperlipidemia 08/22/2020   Osteoarthritis 08/22/2020   Personal history of colonic polyps 08/22/2020   Skin ulcer (HCC) 08/22/2020   Cervical spondylosis with radiculopathy 07/26/2017    Orientation RESPIRATION BLADDER Height & Weight     Self,  Time, Situation, Place  Normal Indwelling catheter Weight: 157 lb 6.5 oz (71.4 kg) Height:  5\' 3"  (160 cm)  BEHAVIORAL SYMPTOMS/MOOD NEUROLOGICAL BOWEL NUTRITION STATUS      Continent Diet (See DC summary)  AMBULATORY STATUS COMMUNICATION OF NEEDS Skin   Extensive Assist Verbally PU Stage and Appropriate Care (PU st 2 Sacrum, skin tear R arm)                       Personal Care Assistance Level of Assistance  Bathing, Feeding, Dressing Bathing Assistance: Maximum assistance Feeding assistance: Limited assistance Dressing Assistance: Maximum assistance     Functional Limitations Info  Sight, Hearing, Speech Sight Info: Impaired Hearing Info: Impaired Speech Info: Adequate    SPECIAL CARE FACTORS FREQUENCY  PT (By licensed PT), OT (By licensed OT)     PT Frequency: 5X week OT Frequency: 5X week            Contractures Contractures Info: Not present    Additional Factors Info  Code Status, Allergies Code Status Info: Full Allergies Info: Other  Penicillins  Lovastatin  Azithromycin  Clindamycin  Hydralazine  Lisinopril  Neomycin Sulfate (Neomycin)  Niacin  Rofecoxib  Tetracycline  Valsartan  Vytorin (Ezetimibe-simvastatin)  Zetia (Ezetimibe)           Current Medications (03/28/2023):  This is the current hospital active medication list Current Facility-Administered Medications  Medication Dose Route Frequency Provider Last Rate Last Admin   0.9 %  sodium chloride infusion  250 mL Intravenous PRN Gillis Santa, MD       acetaminophen (TYLENOL) tablet  650 mg  650 mg Oral Q6H PRN Gillis Santa, MD       Or   acetaminophen (TYLENOL) suppository 650 mg  650 mg Rectal Q6H PRN Gillis Santa, MD       acetaminophen (TYLENOL) tablet 1,000 mg  1,000 mg Oral Q6H Pokhrel, Laxman, MD   1,000 mg at 03/28/23 0551   bisacodyl (DULCOLAX) EC tablet 10 mg  10 mg Oral Daily PRN Gillis Santa, MD       Chlorhexidine Gluconate Cloth 2 % PADS 6 each  6 each Topical Daily Pokhrel,  Laxman, MD   6 each at 03/28/23 1009   cholecalciferol (VITAMIN D3) 25 MCG (1000 UNIT) tablet 5,000 Units  5,000 Units Oral Daily Pokhrel, Laxman, MD   5,000 Units at 03/28/23 1005   doxazosin (CARDURA) tablet 4 mg  4 mg Oral Daily Pokhrel, Laxman, MD   4 mg at 03/28/23 1009   furosemide (LASIX) injection 80 mg  80 mg Intravenous BID Pokhrel, Laxman, MD   80 mg at 03/28/23 1014   HYDROcodone-acetaminophen (NORCO/VICODIN) 5-325 MG per tablet 1 tablet  1 tablet Oral QHS Pokhrel, Laxman, MD   1 tablet at 03/27/23 2213   iron sucrose (VENOFER) 200 mg in sodium chloride 0.9 % 100 mL IVPB  200 mg Intravenous Daily Gillis Santa, MD 440 mL/hr at 03/27/23 1006 200 mg at 03/27/23 1006   isosorbide mononitrate (IMDUR) 24 hr tablet 15 mg  15 mg Oral QHS Gillis Santa, MD   15 mg at 03/27/23 2213   metoprolol succinate (TOPROL-XL) 24 hr tablet 50 mg  50 mg Oral Daily Gillis Santa, MD   50 mg at 03/28/23 1004   ondansetron (ZOFRAN) tablet 4 mg  4 mg Oral Q6H PRN Gillis Santa, MD       Or   ondansetron (ZOFRAN) injection 4 mg  4 mg Intravenous Q6H PRN Gillis Santa, MD       oxyCODONE (Oxy IR/ROXICODONE) immediate release tablet 5 mg  5 mg Oral Q4H PRN Gillis Santa, MD   5 mg at 03/28/23 1013   pantoprazole (PROTONIX) EC tablet 40 mg  40 mg Oral Daily Gillis Santa, MD   40 mg at 03/28/23 1005   polyethylene glycol (MIRALAX / GLYCOLAX) packet 17 g  17 g Oral Daily PRN Gillis Santa, MD       sodium chloride flush (NS) 0.9 % injection 3 mL  3 mL Intravenous Q12H Gillis Santa, MD   3 mL at 03/27/23 2215   sodium chloride flush (NS) 0.9 % injection 3 mL  3 mL Intravenous Q12H Gillis Santa, MD   3 mL at 03/28/23 1010   sodium chloride flush (NS) 0.9 % injection 3 mL  3 mL Intravenous PRN Gillis Santa, MD         Discharge Medications: Please see discharge summary for a list of discharge medications.  Relevant Imaging Results:  Relevant Lab Results:   Additional Information SS# [id number], [place]

## 2023-03-28 NOTE — Progress Notes (Addendum)
PROGRESS NOTE    Tabitha Black  WGN:[id number] DOB: [birth year] DOA: 03/26/2023 PCP: Irven Coe, MD    Brief Narrative:  Tabitha Black is a 89 y.o. female with past medical history of paroxysmal A-fib on anticoagulation, hypertension, hyperlipidemia, HFpEF, CKD stage V, GERD, iron deficiency anemia, GI bleed, cervical radiculopathy, osteoarthritis, chronic bilateral lower extremity lymphedema on Unna boots, presented to hospital after sustaining a fall while getting out of her recliner since her legs just gave way. Patient got wound on her right wrist and was complaining of pain in the right knee.  In the ED vitals were stable.  Labs showed creatinine of 3.4 with albumin 3.4 CK was 656, had mild leukocytosis at 12.3.  Hemoglobin was low at 9.5.  CT head and C-spine was negative for any fractures.  X-ray of the right wrist and right knee without any fractures.  Patient was then considered for admission to hospital for further evaluation and treatment.  Assessment and plan.  Traumatic hematoma of right knee s/p fall Eliquis on hold.  X-ray of the right knee without any fracture has prepatellar bruising..  Continue ice.  Large prepatellar hematoma measuring 12 x 1.7 x 11.3 cm with trace knee effusion.  Continue conservative treatment.  PT OT recommend skilled nursing facility placement.   Right wrist wound s/p fall Continue wound care.  CK level mildly elevated at 360.    AKI on CKD stage V Baseline creatinine 2.6.  Creatinine was 3.4 on admission.  Renal ultrasound shows interval development of mild right hydronephrosis.  Patient's daughter stated that that she has been having worsening lower extremity edema and wishes a nephrology evaluation.  Bladder scan revealed some urinary retention.  Received In-N-Out cath followed by Foley catheter.  On Lasix 80 mg IV twice daily.  Creatinine today at 3.3 from 3.3..  Continue strict intake and output charting Daily weights.  Nephrology has been notified for  consultation.    Chronic lymphedema bilateral lower extremities Continue Unna boots.  On Lasix infusion, will change to IV Lasix.   Paroxysmal A-fib, HTN, HLD, HFpEF Continue Toprol and Imdur.    GERD continue PPI    Iron deficiency anemia, anemia of chronic kidney disease transferrin saturation 10%. Started Venofer 200 mg IV daily for 5 days.  Nephrology will be consulted.  Hypokalemia.  Will replenish orally.  Check levels in AM.  Will give 1 dose and then continue daily doses due to being on high-dose diuretic.      DVT prophylaxis: SCDs Start: 03/26/23 1853, continue to hold Eliquis for now.   Code Status:     Code Status: Full Code  Disposition:  PT OT recommends skilled nursing facility on discharge.  Wishes to go to [facility] if possible.  TOC has been consulted.  Status is: Inpatient  Remains inpatient appropriate because: Status post fall, debility, IV diuretics, nephrology evaluation, need for need for rehabilitation   Family Communication:  Spoke with the patient's daughter on the phone on 03/27/2023  Consultants:  Nephrology   Procedures:  None  Antimicrobials:  None  Anti-infectives (From admission, onward)    None       Subjective: Today, patient was seen and examined at bedside.  Complains of mild right knee discomfort but was able to weight-bear.  Denies any shortness of breath, chest pain, nausea, vomiting, fever or chills.   Objective: Vitals:   03/27/23 2013 03/28/23 0428 03/28/23 0500 03/28/23 0810  BP: (!) 131/57 (!) 129/49  Marland Kitchen)  126/53  Pulse: 62 64  65  Resp: 17   15  Temp: 97.7 F (36.5 C) 98 F (36.7 C)  97.9 F (36.6 C)  TempSrc: Oral     SpO2: 95% 98%  98%  Weight:   71.4 kg   Height:        Intake/Output Summary (Last 24 hours) at 03/28/2023 1300 Last data filed at 03/28/2023 1027 Gross per 24 hour  Intake 840 ml  Output 1900 ml  Net -1060 ml   Filed Weights   03/26/23 1322 03/26/23 2219 03/28/23 0500  Weight:  74.4 kg 75.9 kg 71.4 kg    Physical Examination: Body mass index is 27.88 kg/m.   General:  Average built, not in obvious distress, on room air, elderly female HENT:   No scleral pallor or icterus noted. Oral mucosa is moist.  Chest:   Diminished breath sounds bilaterally. No crackles or wheezes.  CVS: S1 &S2 heard. No murmur.  Regular rate and rhythm. Abdomen: Soft, nontender, nondistended.  Bowel sounds are heard.  Foley catheter in place. Extremities: No cyanosis, clubbing bilateral lower extremity Unna boot.  History of chronic lymphedema.  Left upper extremity with dressing.  Right knee with tenderness, bruising, range of movement mildly restricted due to pain but was able to weight-bear. Psych: Alert, awake and oriented, normal mood CNS:  No cranial nerve deficits.  Moves extremities Skin: Warm and dry.  No rashes noted.  Data Reviewed:   CBC: Recent Labs  Lab 03/26/23 1356 03/27/23 0236 03/28/23 0314  WBC 12.3* 9.6 6.3  NEUTROABS 11.4*  --   --   HGB 9.5* 8.1* 8.2*  HCT 30.1* 25.2* 25.6*  MCV 96.2 95.1 98.8  PLT 179 151 135*    Basic Metabolic Panel: Recent Labs  Lab 03/26/23 1356 03/27/23 0236 03/28/23 0314  NA 137 138 138  K 4.0 3.7 3.3*  CL 102 100 103  CO2 20* 20* 21*  GLUCOSE 127* 115* 95  BUN 77* 80* 82*  CREATININE 3.48* 3.34* 3.32*  CALCIUM 9.7 9.4 8.8*  MG  --  1.7 1.7  PHOS  --  5.4*  --     Liver Function Tests: Recent Labs  Lab 03/26/23 1356 03/27/23 0236  AST 37 37  ALT 15 15  ALKPHOS 66 54  BILITOT 1.1 1.0  PROT 7.0 6.1*  ALBUMIN 3.4* 2.9*     Radiology Studies: US RENAL  Result Date: 03/26/2023 CLINICAL DATA:  Acute kidney injury EXAM: RENAL / URINARY TRACT ULTRASOUND COMPLETE COMPARISON:  CT 01/10/2023 FINDINGS: Right Kidney: Renal measurements: 7.6 x 3.8 x 4.7 cm = volume: 71 mL. Moderate diffuse cortical atrophy and diffusely increased renal cortical echogenicity noted in keeping with changes of underlying medical renal  disease. The lower pole of the right kidney is obscured by overlying bowel gas. Mild right hydronephrosis has developed, new since prior examination. Left Kidney: Renal measurements: 7.5 x 4.3 x 4.0 cm = volume: 67 mL. Visualization of the left kidney is limited by overlying bowel gas and osseous structures. The visualized left renal cortex, however, appears atrophic and is diffusely increased in echogenicity suggesting changes of underlying medical renal disease. No hydronephrosis. No intrarenal masses or calcifications within the visualized portion. Bladder: Appears normal for degree of bladder distention. Other: Incidentally noted is a 16 mm shadowing calculus within the proximal common bile duct. The duct appears moderately dilated measuring 12 mm in diameter. IMPRESSION: 1. Interval development of mild right hydronephrosis. 2. Limited visualization of the  kidneys. Bilateral renal cortical atrophy and increased renal cortical echogenicity in keeping with changes of underlying medical renal disease. 3. 16 mm calculus within the proximal common bile duct with associated moderate biliary ductal dilatation. This can be confirmed with ERCP or MRCP examination. Electronically Signed   By: Helyn Numbers M.D.   On: 03/26/2023 20:47   CT KNEE RIGHT WO CONTRAST  Result Date: 03/26/2023 CLINICAL DATA:  Right knee pain after fall EXAM: CT OF THE RIGHT KNEE WITHOUT CONTRAST TECHNIQUE: Multidetector CT imaging of the right knee was performed according to the standard protocol. Multiplanar CT image reconstructions were also generated. RADIATION DOSE REDUCTION: This exam was performed according to the departmental dose-optimization program which includes automated exposure control, adjustment of the mA and/or kV according to patient size and/or use of iterative reconstruction technique. COMPARISON:  X-ray 03/26/2023 FINDINGS: Bones/Joint/Cartilage Postsurgical changes from right total knee arthroplasty. Arthroplasty  components are in their expected alignment. No periprosthetic lucency or fracture identified. Specifically, no patellar fracture. Trace knee joint effusion. No lipohemarthrosis. Ligaments Suboptimally assessed by CT. Muscles and Tendons No acute musculotendinous abnormality by CT. Soft tissues Large hyperdense prepatellar hematoma measuring up to 12.0 x 1.7 x 11.3 cm (volume = 120 cm^3). Circumferential subcutaneous edema throughout the visualized right leg. IMPRESSION: 1. Large prepatellar hematoma measuring up to 12.0 x 1.7 x 11.3 cm (volume = 120 cc). 2. Postsurgical changes from right total knee arthroplasty. No fracture or evidence of hardware complication. 3. Trace knee joint effusion. No lipohemarthrosis. 4. Circumferential subcutaneous edema throughout the visualized right leg. Electronically Signed   By: Duanne Guess D.O.   On: 03/26/2023 20:27   CT Head Wo Contrast  Result Date: 03/26/2023 CLINICAL DATA:  Trauma and head injury. EXAM: CT HEAD WITHOUT CONTRAST CT CERVICAL SPINE WITHOUT CONTRAST TECHNIQUE: Multidetector CT imaging of the head and cervical spine was performed following the standard protocol without intravenous contrast. Multiplanar CT image reconstructions of the cervical spine were also generated. RADIATION DOSE REDUCTION: This exam was performed according to the departmental dose-optimization program which includes automated exposure control, adjustment of the mA and/or kV according to patient size and/or use of iterative reconstruction technique. COMPARISON:  None Available. FINDINGS: Evaluation of this exam is limited due to motion artifact. CT HEAD FINDINGS Brain: Moderate age-related atrophy and chronic microvascular ischemic changes. There is no acute intracranial hemorrhage. No mass effect or midline shift. No extra-axial fluid collection. Vascular: No hyperdense vessel or unexpected calcification. Skull: Normal. Negative for fracture or focal lesion. Sinuses/Orbits: No acute  finding. Other: None CT CERVICAL SPINE FINDINGS Alignment: No acute subluxation. Grade 1 C4-C5 and T1-T2 anterolisthesis. Skull base and vertebrae: No acute fracture. Advanced osteopenia. There is incomplete bony fusion of the posterior ring of C1. Soft tissues and spinal canal: No prevertebral fluid or swelling. No visible canal hematoma. Disc levels: Multilevel degenerative changes and multilevel facet arthropathy. Upper chest: No acute findings. Other: Bilateral carotid bulb calcified plaques. IMPRESSION: 1. No acute intracranial pathology. Moderate age-related atrophy and chronic microvascular ischemic changes. 2. No acute/traumatic cervical spine pathology. Electronically Signed   By: Elgie Collard M.D.   On: 03/26/2023 17:09   CT Cervical Spine Wo Contrast  Result Date: 03/26/2023 CLINICAL DATA:  Trauma and head injury. EXAM: CT HEAD WITHOUT CONTRAST CT CERVICAL SPINE WITHOUT CONTRAST TECHNIQUE: Multidetector CT imaging of the head and cervical spine was performed following the standard protocol without intravenous contrast. Multiplanar CT image reconstructions of the cervical spine were also generated. RADIATION DOSE  REDUCTION: This exam was performed according to the departmental dose-optimization program which includes automated exposure control, adjustment of the mA and/or kV according to patient size and/or use of iterative reconstruction technique. COMPARISON:  None Available. FINDINGS: Evaluation of this exam is limited due to motion artifact. CT HEAD FINDINGS Brain: Moderate age-related atrophy and chronic microvascular ischemic changes. There is no acute intracranial hemorrhage. No mass effect or midline shift. No extra-axial fluid collection. Vascular: No hyperdense vessel or unexpected calcification. Skull: Normal. Negative for fracture or focal lesion. Sinuses/Orbits: No acute finding. Other: None CT CERVICAL SPINE FINDINGS Alignment: No acute subluxation. Grade 1 C4-C5 and T1-T2  anterolisthesis. Skull base and vertebrae: No acute fracture. Advanced osteopenia. There is incomplete bony fusion of the posterior ring of C1. Soft tissues and spinal canal: No prevertebral fluid or swelling. No visible canal hematoma. Disc levels: Multilevel degenerative changes and multilevel facet arthropathy. Upper chest: No acute findings. Other: Bilateral carotid bulb calcified plaques. IMPRESSION: 1. No acute intracranial pathology. Moderate age-related atrophy and chronic microvascular ischemic changes. 2. No acute/traumatic cervical spine pathology. Electronically Signed   By: Elgie Collard M.D.   On: 03/26/2023 17:09   DG Wrist Complete Right  Result Date: 03/26/2023 CLINICAL DATA:  Right wrist pain status post fall EXAM: RIGHT WRIST - COMPLETE 3+ VIEW COMPARISON:  None Available. FINDINGS: No acute fracture or dislocation. No aggressive osseous lesion. Normal alignment. Generalized osteopenia. Moderate osteoarthritis of the first MCP joint. Severe osteoarthritis of the first IP joint. Moderate osteoarthritis of the first MCP joint. Chondrocalcinosis of the TFCC and scapholunate ligament as can be seen with CPPD. Soft tissue are unremarkable. No radiopaque foreign body or soft tissue emphysema. Peripheral vascular atherosclerotic disease. IMPRESSION: 1. No acute osseous injury of the right wrist. 2. Moderate osteoarthritis of the first MCP joint. Severe osteoarthritis of the first IP joint. Electronically Signed   By: Elige Ko M.D.   On: 03/26/2023 14:58   DG Knee Complete 4 Views Right  Result Date: 03/26/2023 CLINICAL DATA:  Status post fall, right knee pain EXAM: RIGHT KNEE - COMPLETE 4+ VIEW COMPARISON:  None Available. FINDINGS: Right knee demonstrates a total knee arthroplasty without evidence of hardware failure complication. No significant joint effusion. No fracture or dislocation. Alignment is anatomic. IMPRESSION: 1. Right total knee arthroplasty. 2.  No acute osseous injury of the  right knee. Electronically Signed   By: Elige Ko M.D.   On: 03/26/2023 14:57      LOS: 2 days     Joycelyn Das, MD [facility] Available via Epic secure chat 7am-7pm After these hours, please refer to coverage provider listed on [email or web address] 03/28/2023, 1:00 PM

## 2023-03-28 NOTE — Hospital Course (Addendum)
Tabitha Black is a 89 yo F with significant PHX of persistent Afib on Eliquis, HTN, HLD, HFpEF, CKD stage V, GI bleed, IDA, GERD, LE lymphedema who initially presented to the hospital after a fall. This patient is seen at [facility]   Nephrology was consulted *** after her Cr   IDA/anemia of chronic disease*** Bone mineral disorder Chronic lymphedema Paroxysmal Atrial fibrillation HLD HFpEF GERD Physical deconditioning Protein malnutrition Hypokalemia/ Hypomagnesemia     Anemia/ AOCKD vs GI bleed ?Symptomatic Hgb on admission Transfusions Hgb today Arasnesp FOBT EGD   ESRD: euvolemic? HTN: Current medications: BP ranges: Continue:   Hypertparathyroidsm: Cca, Phos. Calcitriol. Sensipar? Fosrenol  Other blood counts  Hyponatremia*  Diet: HD recommendations?  8/20 GFR 15, 2.99 Cr  Na 136 k 4.4 BUN 65  8/13 BUN 71 GFR 13 Cr 3.21 140, 4.2  Hgb 8.2 Iron sat 10 ferritin 444  8/7 138, 5.7 3.48 12 CO2 19  12/2022 TTE 60-65% KVEF   --- MAP 70-74 HR controlled 60-70 On Lasix 80 mg BID IMDUR 15 mg daily  9/8 total output, net 1.6L Admission 9/7 weight 167lb, 157 lb today 9/9  Strict in and out Toprol 50 mg daily  9/9 3.34 Cr 3.32 GFR 13 -> 13 K 3.7 - 3.3 Ca 9.4 ->8.8 AG 18 -> 14 Phos 5.4 Mag 1.7 CK 656->552->360 on 9/9  Hgb 8.2 - stable (baseline ~9) PLT 151 -> 135 PTBNP 971.8 (from 472) , INR elevated  No UA during this admission US renal: new mild R hydronephrosis No obstructive disease noted, however, limited visualization of the kidneys; cortical atrophy bilaterally and increased renal cortical echogenicity. Patient has known bilateral renal cysts first noted 5 years ago.  16 mm calculus within the proximal common bile duct with associated moderate biliary ductal dilatation. This can be confirmed with ERCP or MRCP examination.  CT a/p from 12/2022 with low attenuation lesios in both kidneys not well  characterized but thought to be cysts. One with 3cm in the medial aspect of the L kidney. did not note nephrolithiasis at the time. 2.7 cm diverticulum extending off of the L sifde of the urinary bladder

## 2023-03-28 NOTE — Progress Notes (Signed)
Orthopedic Tech Progress Note Patient Details:  Tabitha Black December 22, 1932 [id number]  Ortho Devices Type of Ortho Device: Radio broadcast assistant Ortho Device/Splint Location: BLE Ortho Device/Splint Interventions: Ordered, Application   Post Interventions Patient Tolerated: Well Instructions Provided: Care of device  Tabitha Black 03/28/2023, 4:23 PM

## 2023-03-28 NOTE — Consult Note (Signed)
Tabitha Black Renal Consultation Note  Requesting MD: Joycelyn Das, MD Indication for Consultation: CKD V with volume overload  HPI:  Tabitha Black is a 89 yo female with significant PHX of persistent Afib on Eliquis, HTN, HLD, HFpEF, CKD stage V, GI bleed, IDA, GERD, LE lymphedema who initially presented to the hospital after a fall. Nephrology service was consulted during this admission per patient request in the setting of volume overload and changes to her furosemide dosing. Tabitha Black is a established patient at [facility] and follows with Dr. Allena Katz.  Patient was recently discharged from hospital after repair of Spigelian hernia after which she was discharged to [facility] for acute rehab. During that hospitalization, she developed an AKI on CKD and was discharged on a reduced furosemide regimen. Prior to 6/24 admission, patient was taking Furosemide 120 mg in the AM and 80 mg in the PM.   Since discharge from [facility] at the end of July, patient has been on 80 mg twice daily. She saw primary nephrologist, Dr. Allena Katz, at the end of July, and this therapy was continued. At the time, her LE has baseline swelling which is pitting edema 2+ up to the knees. However, this progressively worsened  over the past 4 weeks, with swelling up to the hips and weeping clear fluid from the lower extremities. Patient reported increase in weight of ~10 lb since being discharged from the rehab facility. She communicated this to her doctors and received instructions to increase her AM dosing last Friday, however, this is when she had a mechanical fall at home and had to come to ED.  No changes in her diet, chest pain, shortness of breath or uremic symptoms. No changes in bowel habits, abdominal pain, or changes in urination. She has continued voiding about once per hour, which is at her baseline.   Since admission, patient reports improvement in her lower extremity edema, now almost at her  baseline. She has also required a foley catheter for acute retention. Denies dysuria, suprapubic pressure, gross hematuria, or lower back pain. She feels as though she can usually empty her bladder without problems.    Patient lives alone at home. Has the assistance of [facility] nursing coming to her twice per week. Whenever her legs are at baseline swelling, she is able to do ADLs, though slowly. This has been difficult for the past week. Patient's sister lives next door. Her daughter, Tabitha Black, lives in [place], about 2 hours away.   Creatinine, Ser  Date/Time Value Ref Range Status  03/28/2023 03:14 AM 3.32 (H) 0.44 - 1.00 mg/dL Final  21/30/8657 84:69 AM 3.34 (H) 0.44 - 1.00 mg/dL Final  62/95/2841 32:44 PM 3.48 (H) 0.44 - 1.00 mg/dL Final  07/21/7251 66:44 AM 2.99 (H) 0.57 - 1.00 mg/dL Final  03/47/4259 56:38 PM 3.21 (H) 0.57 - 1.00 mg/dL Final  75/64/3329 51:88 PM 3.48 (H) 0.57 - 1.00 mg/dL Final  41/66/0630 16:01 AM 3.41 (H) 0.44 - 1.00 mg/dL Final  09/32/3557 32:20 AM 3.62 (H) 0.44 - 1.00 mg/dL Final  25/42/7062 37:62 AM 4.09 (H) 0.44 - 1.00 mg/dL Final  83/15/1761 60:73 AM 4.16 (H) 0.44 - 1.00 mg/dL Final  71/12/2692 85:46 AM 3.89 (H) 0.44 - 1.00 mg/dL Final  27/09/5007 38:18 AM 4.00 (H) 0.44 - 1.00 mg/dL Final  29/93/7169 67:89 AM 3.89 (H) 0.44 - 1.00 mg/dL Final  38/04/1750 02:58 PM 3.90 (H) 0.44 - 1.00 mg/dL Final  52/77/8242 35:36 AM 2.81 (H) 0.44 - 1.00 mg/dL  Final  11/14/2022 12:50 AM 3.26 (H) 0.44 - 1.00 mg/dL Final  01/16/1600 09:32 AM 3.19 (H) 0.44 - 1.00 mg/dL Final  35/57/3220 25:42 PM 3.21 (H) 0.44 - 1.00 mg/dL Final  70/62/3762 83:15 PM 3.09 (H) 0.44 - 1.00 mg/dL Final  17/61/6073 71:06 PM 3.11 (H) 0.44 - 1.00 mg/dL Final  26/94/8546 27:03 AM 2.09 (H) 0.44 - 1.00 mg/dL Final  50/03/3817 29:93 AM 2.30 (H) 0.44 - 1.00 mg/dL Final  71/69/6789 38:10 AM 2.65 (H) 0.44 - 1.00 mg/dL Final  17/51/0258 52:77 PM 2.90 (H) 0.44 - 1.00 mg/dL Final  82/42/3536 14:43 AM 2.29 (H)  0.44 - 1.00 mg/dL Final  15/40/0867 61:95 AM 2.44 (H) 0.44 - 1.00 mg/dL Final  09/32/6712 45:80 AM 2.61 (H) 0.44 - 1.00 mg/dL Final  99/83/3825 05:39 PM 2.50 (H) 0.44 - 1.00 mg/dL Final  76/73/4193 79:02 PM 2.59 (H) 0.44 - 1.00 mg/dL Final  40/97/3532 99:24 PM 2.32 (H) 0.57 - 1.00 mg/dL Final  26/83/4196 22:29 AM 2.31 (H) 0.57 - 1.00 mg/dL Final  79/89/2119 41:74 PM 2.21 (H) 0.57 - 1.00 mg/dL Final  03/01/4817 56:31 AM 2.23 (H) 0.57 - 1.00 mg/dL Final  49/70/2637 85:88 PM 1.88 (H) 0.57 - 1.00 mg/dL Final  50/27/7412 87:86 AM 2.20 (H) 0.44 - 1.00 mg/dL Final  76/72/0947 09:62 AM 2.46 (H) 0.44 - 1.00 mg/dL Final  83/66/2947 65:46 AM 3.44 (H) 0.44 - 1.00 mg/dL Final  50/35/4656 81:27 PM 4.04 (H) 0.44 - 1.00 mg/dL Final  51/70/0174 94:49 AM 4.33 (H) 0.44 - 1.00 mg/dL Final  67/59/1638 46:65 PM 4.04 (H) 0.44 - 1.00 mg/dL Final  99/35/7017 79:39 AM 1.45 (H) 0.4 - 1.2 mg/dL Final  03/00/9233 00:76 AM 1.51 (H) 0.4 - 1.2 mg/dL Final     PMHx:   Past Medical History:  Diagnosis Date   ABLA (acute blood loss anemia) 08/23/2022   CKD (chronic kidney disease)    Diabetes mellitus without complication (HCC)    type II   Gastrointestinal hemorrhage with melena 08/23/2022   GERD (gastroesophageal reflux disease)    High cholesterol    Hypertension    Osteoarthritis    Skin cancer, basal cell     Past Surgical History:  Procedure Laterality Date   ABDOMINAL HYSTERECTOMY N/A    APPENDECTOMY N/A 1953   BACK SURGERY     BIOPSY  08/25/2022   Procedure: BIOPSY;  Surgeon: Charlott Rakes, MD;  Location: [facility] ENDOSCOPY;  Service: Gastroenterology;;   CHOLECYSTECTOMY     COLONOSCOPY N/A 02/22/2008   COLONOSCOPY N/A 11/16/2022   Procedure: COLONOSCOPY;  Surgeon: Lynann Bologna, DO;  Location: [facility] ENDOSCOPY;  Service: Gastroenterology;  Laterality: N/A;   ESOPHAGOGASTRODUODENOSCOPY (EGD) WITH PROPOFOL N/A 08/25/2022   Procedure: ESOPHAGOGASTRODUODENOSCOPY (EGD) WITH PROPOFOL;  Surgeon: Charlott Rakes, MD;  Location: [facility] ENDOSCOPY;  Service: Gastroenterology;  Laterality: N/A;   ESOPHAGOGASTRODUODENOSCOPY (EGD) WITH PROPOFOL N/A 11/14/2022   Procedure: ESOPHAGOGASTRODUODENOSCOPY (EGD) WITH PROPOFOL;  Surgeon: Kathi Der, MD;  Location: [facility] ENDOSCOPY;  Service: Gastroenterology;  Laterality: N/A;   TOTAL KNEE ARTHROPLASTY Right    UMBILICAL HERNIA REPAIR N/A 04/02/2015   Procedure: LAPAROSCOPIC UMBILICAL HERNIA REPAIR WITH VENTRALIGHT MESH;  Surgeon: Axel Filler, MD;  Location: [facility] OR;  Service: General;  Laterality: N/A;   XI ROBOTIC ASSISTED INGUINAL HERNIA REPAIR WITH MESH N/A 01/12/2023   Procedure: XI ROBOTIC ASSISTED INCISIONAL  HERNIA REPAIR WITH MESH;  Surgeon: Axel Filler, MD;  Location: [facility] OR;  Service: General;  Laterality: N/A;    Family Hx:  Family History  Problem Relation Age of Onset   Heart failure Mother    Hypertension Mother    Heart attack Father    Prostate cancer Father    Breast cancer Sister     Social History:  reports that she has never smoked. She has never used smokeless tobacco. She reports current alcohol use. She reports that she does not use drugs.  Allergies:  Allergies  Allergen Reactions   Other Swelling    Topical mycin, unsure of which one, caused opthalmic swelling  Erythromycin    Penicillins Shortness Of Breath, Itching, Swelling and Rash   Lovastatin Other (See Comments)    Weakness and myalgias, maybe to statins?   Azithromycin Other (See Comments)    Patient doesn't remember having a reaction to this.   Clindamycin Other (See Comments)    Patient doesn't remember having a reaction to this.    Hydralazine Other (See Comments)    Felt foggy   Lisinopril Other (See Comments)    Hyperkalemia    Neomycin Sulfate [Neomycin] Other (See Comments)    Redness and burning in face   Niacin Hives    Facial swelling and redness   Rofecoxib Other (See Comments)    Unknown reaction   Tetracycline    Valsartan Other (See  Comments)    Hyperkalemia    Vytorin [Ezetimibe-Simvastatin] Other (See Comments)    myalgias   Zetia [Ezetimibe] Other (See Comments)    Leg pain    Medications: Prior to Admission medications   Medication Sig Start Date End Date Taking? Authorizing Provider  acetaminophen (TYLENOL) 500 MG tablet Take 2 tablets (1,000 mg total) by mouth every 6 (six) hours. 01/14/23  Yes Barnetta Chapel, PA-C  apixaban (ELIQUIS) 2.5 MG TABS tablet Take 1 tablet (2.5 mg total) by mouth 2 (two) times daily. 06/08/22  Yes Wendall Stade, MD  Cholecalciferol (VITAMIN D3) 125 MCG (5000 UT) TABS Take 5,000 Units by mouth daily.   Yes [provider]  doxazosin (CARDURA) 4 MG tablet Take 4 mg by mouth daily.   Yes [provider]  furosemide (LASIX) 20 MG tablet Take 80 mg by mouth 2 (two) times daily.   Yes [provider]  HYDROcodone-acetaminophen (NORCO/VICODIN) 5-325 MG tablet Take 1 tablet by mouth at bedtime.   Yes [provider]  isosorbide mononitrate (IMDUR) 30 MG 24 hr tablet Take 1/2 tablet (15 mg) by mouth at bedtime. 10/06/22  Yes Danford, Earl Lites, MD  metoprolol succinate (TOPROL-XL) 50 MG 24 hr tablet Take 1 tablet (50 mg total) by mouth every evening. Take with or immediately following a meal. 11/16/22  Yes Willeen Niece, MD  Multiple Vitamins-Minerals (HAIR SKIN AND NAILS FORMULA) TABS Take 1 tablet by mouth daily.   Yes [provider]  pantoprazole (PROTONIX) 40 MG tablet Take 40 mg by mouth daily. 12/28/22  Yes [provider]    I have reviewed the patient's current medications. Prior to Admission:  Medications Prior to Admission  Medication Sig Dispense Refill Last Dose   acetaminophen (TYLENOL) 500 MG tablet Take 2 tablets (1,000 mg total) by mouth every 6 (six) hours. 30 tablet 0 unk   apixaban (ELIQUIS) 2.5 MG TABS tablet Take 1 tablet (2.5 mg total) by mouth 2 (two) times daily. 60 tablet 3 03/26/2023 at 1200   Cholecalciferol  (VITAMIN D3) 125 MCG (5000 UT) TABS Take 5,000 Units by mouth daily.   03/26/2023   doxazosin (CARDURA) 4 MG tablet Take 4  mg by mouth daily.   03/26/2023   furosemide (LASIX) 20 MG tablet Take 80 mg by mouth 2 (two) times daily.   03/26/2023   HYDROcodone-acetaminophen (NORCO/VICODIN) 5-325 MG tablet Take 1 tablet by mouth at bedtime.   03/25/2023   isosorbide mononitrate (IMDUR) 30 MG 24 hr tablet Take 1/2 tablet (15 mg) by mouth at bedtime. 30 tablet 3 03/25/2023   metoprolol succinate (TOPROL-XL) 50 MG 24 hr tablet Take 1 tablet (50 mg total) by mouth every evening. Take with or immediately following a meal. 30 tablet 1 03/25/2023 at 1800   Multiple Vitamins-Minerals (HAIR SKIN AND NAILS FORMULA) TABS Take 1 tablet by mouth daily.   03/26/2023   pantoprazole (PROTONIX) 40 MG tablet Take 40 mg by mouth daily.   03/25/2023    Labs:  Results for orders placed or performed during the hospital encounter of 03/26/23 (from the past 48 hour(s))  CBC with Differential     Status: Abnormal   Collection Time: 03/26/23  1:56 PM  Result Value Ref Range   WBC 12.3 (H) 4.0 - 10.5 K/uL   RBC 3.13 (L) 3.87 - 5.11 MIL/uL   Hemoglobin 9.5 (L) 12.0 - 15.0 g/dL   HCT 09.3 (L) 81.8 - 29.9 %   MCV 96.2 80.0 - 100.0 fL   MCH 30.4 26.0 - 34.0 pg   MCHC 31.6 30.0 - 36.0 g/dL   RDW 37.1 69.6 - 78.9 %   Platelets 179 150 - 400 K/uL   nRBC 0.0 0.0 - 0.2 %   Neutrophils Relative % 93 %   Neutro Abs 11.4 (H) 1.7 - 7.7 K/uL   Lymphocytes Relative 3 %   Lymphs Abs 0.4 (L) 0.7 - 4.0 K/uL   Monocytes Relative 3 %   Monocytes Absolute 0.4 0.1 - 1.0 K/uL   Eosinophils Relative 0 %   Eosinophils Absolute 0.0 0.0 - 0.5 K/uL   Basophils Relative 0 %   Basophils Absolute 0.0 0.0 - 0.1 K/uL   Immature Granulocytes 1 %   Abs Immature Granulocytes 0.06 0.00 - 0.07 K/uL    Comment: Performed at [facility], [address] [place], [place] [address]  Comprehensive metabolic panel     Status: Abnormal   Collection Time:  03/26/23  1:56 PM  Result Value Ref Range   Sodium 137 135 - 145 mmol/L   Potassium 4.0 3.5 - 5.1 mmol/L   Chloride 102 98 - 111 mmol/L   CO2 20 (L) 22 - 32 mmol/L   Glucose, Bld 127 (H) 70 - 99 mg/dL    Comment: Glucose reference range applies only to samples taken after fasting for at least 8 hours.   BUN 77 (H) 8 - 23 mg/dL   Creatinine, Ser 7.51 (H) 0.44 - 1.00 mg/dL   Calcium 9.7 8.9 - 02.5 mg/dL   Total Protein 7.0 6.5 - 8.1 g/dL   Albumin 3.4 (L) 3.5 - 5.0 g/dL   AST 37 15 - 41 U/L   ALT 15 0 - 44 U/L   Alkaline Phosphatase 66 38 - 126 U/L   Total Bilirubin 1.1 0.3 - 1.2 mg/dL   GFR, Estimated 12 (L) >60 mL/min    Comment: (NOTE) Calculated using the CKD-EPI Creatinine Equation (2021)    Anion gap 15 5 - 15    Comment: Performed at [facility], [address] [place], [place] [address]  CK     Status: Abnormal   Collection Time: 03/26/23  1:56  PM  Result Value Ref Range   Total CK 656 (H) 38 - 234 U/L    Comment: Performed at [facility], [address] [place], [place] [address]  Brain natriuretic peptide     Status: Abnormal   Collection Time: 03/27/23  2:36 AM  Result Value Ref Range   B Natriuretic Peptide 971.8 (H) 0.0 - 100.0 pg/mL    Comment: Performed at [facility], [address] [place], [place] [address]  Comprehensive metabolic panel     Status: Abnormal   Collection Time: 03/27/23  2:36 AM  Result Value Ref Range   Sodium 138 135 - 145 mmol/L   Potassium 3.7 3.5 - 5.1 mmol/L   Chloride 100 98 - 111 mmol/L   CO2 20 (L) 22 - 32 mmol/L   Glucose, Bld 115 (H) 70 - 99 mg/dL    Comment: Glucose reference range applies only to samples taken after fasting for at least 8 hours.   BUN 80 (H) 8 - 23 mg/dL   Creatinine, Ser 7.61 (H) 0.44 - 1.00 mg/dL   Calcium 9.4 8.9 - 60.7 mg/dL   Total Protein 6.1 (L) 6.5 - 8.1 g/dL   Albumin 2.9 (L) 3.5 - 5.0 g/dL   AST 37 15 - 41 U/L   ALT 15 0 - 44 U/L   Alkaline Phosphatase 54 38 - 126 U/L    Total Bilirubin 1.0 0.3 - 1.2 mg/dL   GFR, Estimated 13 (L) >60 mL/min    Comment: (NOTE) Calculated using the CKD-EPI Creatinine Equation (2021)    Anion gap 18 (H) 5 - 15    Comment: Performed at [facility], [address] [place], [place] [address]  CBC     Status: Abnormal   Collection Time: 03/27/23  2:36 AM  Result Value Ref Range   WBC 9.6 4.0 - 10.5 K/uL   RBC 2.65 (L) 3.87 - 5.11 MIL/uL   Hemoglobin 8.1 (L) 12.0 - 15.0 g/dL   HCT 26.9 (L) 48.5 - 46.2 %   MCV 95.1 80.0 - 100.0 fL   MCH 30.6 26.0 - 34.0 pg   MCHC 32.1 30.0 - 36.0 g/dL   RDW 70.3 50.0 - 93.8 %   Platelets 151 150 - 400 K/uL   nRBC 0.0 0.0 - 0.2 %    Comment: Performed at [facility], [address] [place], [place] [address]  Protime-INR     Status: Abnormal   Collection Time: 03/27/23  2:36 AM  Result Value Ref Range   Prothrombin Time 18.3 (H) 11.4 - 15.2 seconds   INR 1.5 (H) 0.8 - 1.2    Comment: (NOTE) INR goal varies based on device and disease states. Performed at [facility], [address], [place], [place] [address]   Phosphorus     Status: Abnormal   Collection Time: 03/27/23  2:36 AM  Result Value Ref Range   Phosphorus 5.4 (H) 2.5 - 4.6 mg/dL    Comment: Performed at [facility], [address] [place], [place] [address]  Magnesium     Status: None   Collection Time: 03/27/23  2:36 AM  Result Value Ref Range   Magnesium 1.7 1.7 - 2.4 mg/dL    Comment: Performed at [facility], [address], [place], [place] [address]  CK     Status: Abnormal   Collection Time: 03/27/23  2:36 AM  Result Value Ref Range  Total CK 552 (H) 38 - 234 U/L    Comment: Performed at [facility], [address], [place], [place] [address]  CK     Status: Abnormal   Collection Time: 03/28/23  3:14 AM  Result Value Ref Range   Total CK 360 (H) 38 - 234 U/L    Comment: Performed at [facility], [address], [place], [place] [address]  Basic metabolic  panel     Status: Abnormal   Collection Time: 03/28/23  3:14 AM  Result Value Ref Range   Sodium 138 135 - 145 mmol/L   Potassium 3.3 (L) 3.5 - 5.1 mmol/L   Chloride 103 98 - 111 mmol/L   CO2 21 (L) 22 - 32 mmol/L   Glucose, Bld 95 70 - 99 mg/dL    Comment: Glucose reference range applies only to samples taken after fasting for at least 8 hours.   BUN 82 (H) 8 - 23 mg/dL   Creatinine, Ser 0.98 (H) 0.44 - 1.00 mg/dL   Calcium 8.8 (L) 8.9 - 10.3 mg/dL   GFR, Estimated 13 (L) >60 mL/min    Comment: (NOTE) Calculated using the CKD-EPI Creatinine Equation (2021)    Anion gap 14 5 - 15    Comment: Performed at [facility], [address] [place], [place] [address]  CBC     Status: Abnormal   Collection Time: 03/28/23  3:14 AM  Result Value Ref Range   WBC 6.3 4.0 - 10.5 K/uL   RBC 2.59 (L) 3.87 - 5.11 MIL/uL   Hemoglobin 8.2 (L) 12.0 - 15.0 g/dL   HCT 78.2 (L) 95.6 - 21.3 %   MCV 98.8 80.0 - 100.0 fL   MCH 31.7 26.0 - 34.0 pg   MCHC 32.0 30.0 - 36.0 g/dL   RDW 08.6 57.8 - 46.9 %   Platelets 135 (L) 150 - 400 K/uL   nRBC 0.0 0.0 - 0.2 %    Comment: Performed at [facility], [address] [place], [place] [address]  Magnesium     Status: None   Collection Time: 03/28/23  3:14 AM  Result Value Ref Range   Magnesium 1.7 1.7 - 2.4 mg/dL    Comment: Performed at [facility], [address], [place], [place] [address]     ROS:  Pertinent items are noted in HPI. Pertinent items noted in HPI and remainder of comprehensive ROS otherwise negative.  Physical Exam: Vitals:   03/28/23 0428 03/28/23 0810  BP: (!) 129/49 (!) 126/53  Pulse: 64 65  Resp:  15  Temp: 98 F (36.7 C) 97.9 F (36.6 C)  SpO2: 98% 98%    9/8: total output, net 1.6L 9/7Admission weight 167lb -->  9/9 157 lb today  General: Elderly woman in NAD, sitting in recliner chair HEENT: Anicteric, moist mucus membranes Neck: no overly distended neck veins Heart: irregular rate. No  murmurs. Bilateral radial pulses Lungs: Speaking in full sentences without increased WOB. CTAB, no rales or crackles Abdomen: Non distended Extremities: warm. Trace edema in bilateral thighs. Ecchymoses on upper extremities and over R knee. Bandages present on lower extremities. Skin: Warm and dry Neuro: alert, oriented, and responding to questions appropriately  Assessment/Plan: Renal- CKD stage 5 without dialysis: Follows with Dr. Allena Katz. Persistently elevated Cr >3 since June 2024. Previous baseline 2-3. -Question AKI on CKD, progression of CKD, vs cardiorenal syndrome 2/2 HFpEF and decreased home diuretics. -Mild improvement in  Cr and EGF on IV lasix -No UA during this admission -US renal: new mild R hydronephrosis. No obstructive disease noted, however, limited visualization of the kidneys; cortical atrophy bilaterally and increased renal cortical echogenicity. Patient has known bilateral renal cysts first noted 5 years ago. Suspect mild R hydronephrosis in setting of acute urinary retention. Favor keeping foley catheter for 48 hours and repeat UR renal. If worsening, consider noncontrasted CT Renal study -Trace bilateral LE edema, improving. Adequate UOP over 24 hours.  -Will need K supplementation; 20 mEq now and 20 mEq this evening. Recommend reassessing daily need for K supplementation -No indication for dialysis at this time; suspect patient will need another day of BID IV 80 mg then transition to 120 mg AM and 80 mg PO lasix.  -Strict I/Os: 2.60mL output in 24 hrs, net 1.6L on current IV lasix. -Avoid nephrotoxic medications including NSAIDs and iodinated intravenous contrast exposure unless the latter is absolutely indicated. Preferred narcotic agents for pain control are hydromorphone, fentanyl, and methadone. Morphine should not be used. Avoid Baclofen and avoid oral sodium phosphate and magnesium citrate based laxatives / bowel preps.   HFpEFHypertensionvolumeLymphedema on  bilateral lower extremities BNP 971.8. TTE 12/2022 with LVEF 60-65%. MAP 70-72 during this admission on IMDUR 15 mg daily, doxasozin 4 mg daily, and current lasix dosing. May need to monitor for signs of orthostasis as patient continues to diurese. Continue [employer].  Anemia AOCKD  IDA hx of GI bleed - Receives Reacrit 10K units q2weeks at infusion center. Last dose 8/27. Currently receiving Venofer 200 mg x 5 doses for Hgb 8.2 - stable (baseline ~9) and recent sat ratio of 10. Will Aranesp 60 mg subcutaneous weekly while hospitalized   CKD-Metabolic bone disease, secondary hyperparathyroidism. Current phos 5.4 on carb modified diet. Recommend switching patient to renal diet if worsening.  Primary team: Permanent atrial fibrillation:  HR controlled 60-70 . Was evaluated in office by Dr. Lalla Brothers regarding left atrial appendage occlussion device. Advanced CKD and age prohibitive risk to undergo surgery. She had been continued on Eliquis prior to admission. Currently holding AC in setting of fall and high risk of bleeding. Toprol XL 50 mg daily. Mechanical fallTraumatic  hematoma of R knee' R wrist wound: per primary GERD: Continue PPI Incidental finding: 16 mm calculus within the proximal common bile duct with associated moderate biliary ductal dilatation. LFTs within normal limits. Functional deconditioning: PT evaluation; SNF for acute rehab  Morene Crocker 03/28/2023, 11:21 AM

## 2023-03-28 NOTE — TOC Initial Note (Signed)
Transition of Care [facility]) - Initial/Assessment Note    Patient Details  Name: Tabitha Black MRN: [id number] Date of Birth: Feb 26, 1933  Transition of Care [facility]) CM/SW Contact:    Carley Hammed, LCSW Phone Number: 03/28/2023, 11:28 AM  Clinical Narrative:                 CSW met with pt and daughter at bedside to discuss SNF recommendations. Pt and daughter agreeable to SNF and request [facility] as she has been there several times. Pt and family aware of copay days. CSW to complete workup and fax out, and alert facility to request. TOC will continue to follow for DC needs.   Expected Discharge Plan: Skilled Nursing Facility Barriers to Discharge: Continued Medical Work up, SNF Pending bed offer   Patient Goals and CMS Choice Patient states their goals for this hospitalization and ongoing recovery are:: Pt is agreeable to returning to [facility] for rehab prior to returning home w/ family support. CMS Medicare.gov Compare Post Acute Care list provided to:: Patient Choice offered to / list presented to : Patient, Adult Children      Expected Discharge Plan and Services     Post Acute Care Choice: Skilled Nursing Facility Living arrangements for the past 2 months: Single Family Home                                      Prior Living Arrangements/Services Living arrangements for the past 2 months: Single Family Home Lives with:: Spouse Patient language and need for interpreter reviewed:: Yes Do you feel safe going back to the place where you live?: Yes      Need for Family Participation in Patient Care: Yes (Comment) Care giver support system in place?: Yes (comment)   Criminal Activity/Legal Involvement Pertinent to Current Situation/Hospitalization: No - Comment as needed  Activities of Daily Living Home Assistive Devices/Equipment: Walker (specify type) ADL Screening (condition at time of admission) Patient's cognitive ability adequate to safely complete daily  activities?: Yes Is the patient deaf or have difficulty hearing?: Yes Does the patient have difficulty seeing, even when wearing glasses/contacts?: No Does the patient have difficulty concentrating, remembering, or making decisions?: No Patient able to express need for assistance with ADLs?: Yes Does the patient have difficulty dressing or bathing?: Yes Independently performs ADLs?: No Communication: Independent Dressing (OT): Needs assistance Grooming: Needs assistance Is this a change from baseline?: Change from baseline, expected to last >3 days Feeding: Independent Bathing: Needs assistance Toileting: Needs assistance Is this a change from baseline?: Change from baseline, expected to last >3days In/Out Bed: Needs assistance Is this a change from baseline?: Change from baseline, expected to last >3 days Walks in Home: Independent with device (comment) Does the patient have difficulty walking or climbing stairs?: Yes Weakness of Legs: Right Weakness of Arms/Hands: Both  Permission Sought/Granted Permission sought to share information with : Family Supports, Oceanographer granted to share information with : Yes, Verbal Permission Granted  Share Information with NAME: Renea  Permission granted to share info w AGENCY: [facility]  Permission granted to share info w Relationship: Daughter     Emotional Assessment Appearance:: Appears stated age Attitude/Demeanor/Rapport: Engaged Affect (typically observed): Appropriate Orientation: : Oriented to Self, Oriented to Place, Oriented to  Time, Oriented to Situation Alcohol / Substance Use: Not Applicable Psych Involvement: No (comment)  Admission diagnosis:  Peripheral edema [  R60.0] Elevated CK [R74.8] Traumatic hematoma of knee [S80.00XA] Skin tear of right elbow without complication, initial encounter [S51.011A] Traumatic hematoma of right knee, initial encounter [S80.01XA] Patient Active Problem List    Diagnosis Date Noted   Pressure injury of skin 03/27/2023   Traumatic hematoma of knee 03/26/2023   Irreducible Spigelian hernia 01/11/2023   Abdominal pain 01/10/2023   GI bleeding 11/12/2022   Cellulitis of right leg 11/12/2022   Lymphedema of both lower extremities - R > L 10/04/2022   HTN (hypertension) 08/23/2022   Persistent atrial fibrillation (HCC) 08/23/2022   DM2 (diabetes mellitus, type 2) (HCC) 08/23/2022   Pain in joint of left knee 01/06/2021   Adverse effect of antihyperlipidemic and antiarteriosclerotic drugs, initial encounter 08/22/2020   Age-related physical debility 08/22/2020   (HFpEF) heart failure with preserved ejection fraction (HCC) 08/22/2020   Chronic kidney disease (CKD), active medical management without dialysis, stage 5 (HCC) 08/22/2020   Diabetic renal disease (HCC) 08/22/2020   Gastroesophageal reflux disease 08/22/2020   History of malignant neoplasm of skin 08/22/2020   Iron deficiency anemia 08/22/2020   Mixed hyperlipidemia 08/22/2020   Osteoarthritis 08/22/2020   Personal history of colonic polyps 08/22/2020   Skin ulcer (HCC) 08/22/2020   Cervical spondylosis with radiculopathy 07/26/2017   PCP:  Irven Coe, MD Pharmacy:   [facility] [phone] [place], [place] - [address] [place] [address] Phone: [phone] Fax: [phone]     Social Determinants of Health (SDOH) Social History: SDOH Screenings   Food Insecurity: No Food Insecurity (03/27/2023)  Housing: Low Risk  (03/27/2023)  Transportation Needs: No Transportation Needs (03/27/2023)  Utilities: Not At Risk (03/27/2023)  Tobacco Use: Low Risk  (03/27/2023)   SDOH Interventions:     Readmission Risk Interventions    11/16/2022    3:25 PM  Readmission Risk Prevention Plan  Transportation Screening Complete  PCP or Specialist Appt within 3-5 Days Complete  HRI or Home Care Consult Complete  Social Work Consult for Recovery Care  Planning/Counseling --  Palliative Care Screening Not Applicable  Medication Review Oceanographer) Complete

## 2023-03-28 NOTE — Plan of Care (Signed)
  Problem: Education: Goal: Knowledge of General Education information will improve Description Including pain rating scale, medication(s)/side effects and non-pharmacologic comfort measures Outcome: Progressing   Problem: Activity: Goal: Risk for activity intolerance will decrease Outcome: Progressing   Problem: Elimination: Goal: Will not experience complications related to bowel motility Outcome: Progressing Goal: Will not experience complications related to urinary retention Outcome: Progressing   Problem: Pain Managment: Goal: General experience of comfort will improve Outcome: Progressing   Problem: Skin Integrity: Goal: Risk for impaired skin integrity will decrease Outcome: Progressing

## 2023-03-29 ENCOUNTER — Encounter (HOSPITAL_COMMUNITY): Payer: Medicare Other

## 2023-03-29 DIAGNOSIS — N185 Chronic kidney disease, stage 5: Secondary | ICD-10-CM | POA: Diagnosis not present

## 2023-03-29 DIAGNOSIS — S8002XA Contusion of left knee, initial encounter: Secondary | ICD-10-CM | POA: Diagnosis not present

## 2023-03-29 DIAGNOSIS — I5032 Chronic diastolic (congestive) heart failure: Secondary | ICD-10-CM | POA: Diagnosis not present

## 2023-03-29 DIAGNOSIS — R54 Age-related physical debility: Secondary | ICD-10-CM | POA: Diagnosis not present

## 2023-03-29 LAB — BASIC METABOLIC PANEL
Anion gap: 17 — ABNORMAL HIGH (ref 5–15)
BUN: 86 mg/dL — ABNORMAL HIGH (ref 8–23)
CO2: 22 mmol/L (ref 22–32)
Calcium: 8.8 mg/dL — ABNORMAL LOW (ref 8.9–10.3)
Chloride: 97 mmol/L — ABNORMAL LOW (ref 98–111)
Creatinine, Ser: 3.21 mg/dL — ABNORMAL HIGH (ref 0.44–1.00)
GFR, Estimated: 13 mL/min — ABNORMAL LOW (ref >60)
Glucose, Bld: 99 mg/dL (ref 70–99)
Potassium: 4.4 mmol/L (ref 3.5–5.1)
Sodium: 136 mmol/L (ref 135–145)

## 2023-03-29 LAB — CBC
HCT: 26.1 % — ABNORMAL LOW (ref 36.0–46.0)
Hemoglobin: 8.2 g/dL — ABNORMAL LOW (ref 12.0–15.0)
MCH: 30 pg (ref 26.0–34.0)
MCHC: 31.4 g/dL (ref 30.0–36.0)
MCV: 95.6 fL (ref 80.0–100.0)
Platelets: 147 10*3/uL — ABNORMAL LOW (ref 150–400)
RBC: 2.73 MIL/uL — ABNORMAL LOW (ref 3.87–5.11)
RDW: 13.9 % (ref 11.5–15.5)
WBC: 8.7 10*3/uL (ref 4.0–10.5)
nRBC: 0 % (ref 0.0–0.2)

## 2023-03-29 LAB — MAGNESIUM: Magnesium: 1.8 mg/dL (ref 1.7–2.4)

## 2023-03-29 MED ORDER — FUROSEMIDE 10 MG/ML IJ SOLN
80.0000 mg | Freq: Two times a day (BID) | INTRAMUSCULAR | Status: AC
  Administered 2023-03-29: 80 mg via INTRAVENOUS
  Filled 2023-03-29: qty 8

## 2023-03-29 MED ORDER — DOUBLE ANTIBIOTIC 500-10000 UNIT/GM EX OINT
TOPICAL_OINTMENT | Freq: Every day | CUTANEOUS | Status: DC
  Filled 2023-03-29: qty 28.4

## 2023-03-29 MED ORDER — FUROSEMIDE 40 MG PO TABS
120.0000 mg | ORAL_TABLET | Freq: Every day | ORAL | Status: AC
  Administered 2023-03-30: 120 mg via ORAL
  Filled 2023-03-29: qty 3

## 2023-03-29 NOTE — Progress Notes (Addendum)
PROGRESS NOTE    Tabitha Black  UEA:[id number] DOB: 01-Jan-1933 DOA: 03/26/2023 PCP: Irven Coe, MD    Brief Narrative:  Tabitha Black is a 89 y.o. female with past medical history of paroxysmal A-fib on anticoagulation, hypertension, hyperlipidemia, HFpEF, CKD stage V, GERD, iron deficiency anemia, GI bleed, cervical radiculopathy, osteoarthritis, chronic bilateral lower extremity lymphedema on Unna boots, presented to hospital after sustaining a fall while getting out of her recliner since her legs just gave way. Patient got wound on her right wrist and was complaining of pain in the right knee.  In the ED vitals were stable.  Labs showed creatinine of 3.4 with albumin 3.4 CK was 656, had mild leukocytosis at 12.3.  Hemoglobin was low at 9.5.  CT head and C-spine was negative for any fractures.  X-ray of the right wrist and right knee without any fractures.  Patient was then considered for admission to hospital for further evaluation and treatment.  Assessment and plan.  Traumatic hematoma of right knee s/p fall Likely contributed by being on Eliquis.  Eliquis on hold.  X-ray of the right knee without any fracture has prepatellar bruising.  Continue ice patient and supportive care.  Imaging showed large prepatellar hematoma measuring 12 x 1.7 x 11.3 cm with trace knee effusion.  Continue conservative treatment.  PT OT has recommended skilled nursing facility placement.   Right wrist wound s/p fall Continue wound care.  CK level mildly elevated at 360.    AKI on CKD stage V Baseline creatinine 2.6.  Creatinine was 3.4 on admission.  Renal ultrasound shows interval development of mild right hydronephrosis.    Bladder scan revealed some urinary retention.  Received In-N-Out cath followed by Foley catheter.  On Lasix 80 mg IV twice daily.  Creatinine today at 3.2 from 3.3..  Continue strict intake and output charting Daily weights.  Nephrology on board for volume management.    Chronic lymphedema  bilateral lower extremities Continue Unna boots.  On IV Lasix.   Paroxysmal A-fib, HTN, HLD, HFpEF Continue Toprol and Imdur.    GERD continue PPI    Iron deficiency anemia, anemia of chronic kidney disease transferrin saturation 10%. Started Venofer 200 mg IV daily for 5 days.  Nephrology on board.  Hypokalemia.  Replenished and improved.  Latest potassium of 4.4.      DVT prophylaxis: SCDs Start: 03/26/23 1853, continue to hold Eliquis for now.   Code Status:     Code Status: Full Code  Disposition:  PT OT recommends skilled nursing facility on discharge.  Wishes to go to [facility]  nurs[facility] home if possible.  TOC on board.  Status is: Inpatient  Remains inpatient appropriate because: Status post fall, debility, IV diuretics, nephrology follow-up, need for need for rehabilitation   Family Communication:  Spoke with the patient's daughter on the phone on 03/27/2023  Consultants:  Nephrology   Procedures:  None  Antimicrobials:  None  Anti-infectives (From admission, onward)    None       Subjective: Today, patient was seen and examined at bedside.  Complains of mild pain over the hand and knee.  No shortness of breath dyspnea chest pain fever or chills.  Objective: Vitals:   03/28/23 2111 03/29/23 0453 03/29/23 0705 03/29/23 0801  BP: (!) 132/59 (!) 127/55  (!) 133/46  Pulse: 85 83  84  Resp: 16 16  18   Temp: 98.5 F (36.9 C) 98.6 F (37 C)  98.6 F (37 C)  TempSrc:  Oral Oral  Oral  SpO2: 98% 97%  100%  Weight:   71.9 kg   Height:        Intake/Output Summary (Last 24 hours) at 03/29/2023 0935 Last data filed at 03/29/2023 0801 Gross per 24 hour  Intake 830 ml  Output 2825 ml  Net -1995 ml   Filed Weights   03/26/23 2219 03/28/23 0500 03/29/23 0705  Weight: 75.9 kg 71.4 kg 71.9 kg    Physical Examination: Body mass index is 28.08 kg/m.   General:  Average built, not in obvious distress, on room air, alert awake and Communicative, elderly  female HENT:   No scleral pallor or icterus noted. Oral mucosa is moist.  Chest:   Diminished breath sounds bilaterally. No crackles or wheezes.  CVS: S1 &S2 heard. No murmur.  Regular rate and rhythm. Abdomen: Soft, nontender, nondistended.  Bowel sounds are heard.  Foley catheter in place. Extremities: No cyanosis, clubbing with bilateral lower extremity Unna boot.  History of chronic lymphedema.  Right upper extremity with dressing.  Right knee with tenderness, bruising and swelling.  Able to bend the knee.   Psych: Alert, awake and oriented, normal mood CNS:  No cranial nerve deficits.  Moves all the extremities Skin: Warm and dry.  No rashes noted.  Data Reviewed:   CBC: Recent Labs  Lab 03/26/23 1356 03/27/23 0236 03/28/23 0314 03/29/23 0207  WBC 12.3* 9.6 6.3 8.7  NEUTROABS 11.4*  --   --   --   HGB 9.5* 8.1* 8.2* 8.2*  HCT 30.1* 25.2* 25.6* 26.1*  MCV 96.2 95.1 98.8 95.6  PLT 179 151 135* 147*    Basic Metabolic Panel: Recent Labs  Lab 03/26/23 1356 03/27/23 0236 03/28/23 0314 03/29/23 0207  NA 137 138 138 136  K 4.0 3.7 3.3* 4.4  CL 102 100 103 97*  CO2 20* 20* 21* 22  GLUCOSE 127* 115* 95 99  BUN 77* 80* 82* 86*  CREATININE 3.48* 3.34* 3.32* 3.21*  CALCIUM 9.7 9.4 8.8* 8.8*  MG  --  1.7 1.7 1.8  PHOS  --  5.4*  --   --     Liver Function Tests: Recent Labs  Lab 03/26/23 1356 03/27/23 0236  AST 37 37  ALT 15 15  ALKPHOS 66 54  BILITOT 1.1 1.0  PROT 7.0 6.1*  ALBUMIN 3.4* 2.9*     Radiology Studies: No results found.    LOS: 3 days     Joycelyn Das, MD [facility] Available via Epic secure chat 7am-7pm After these hours, please refer to coverage provider listed on [email or web address] 03/29/2023, 9:35 AM

## 2023-03-29 NOTE — Progress Notes (Signed)
Occupational Therapy Treatment Patient Details Name: Tabitha Black MRN: [id number] DOB: 04-Jun-1933 Today's Date: 03/29/2023   History of present illness 89 y.o. female with Past medical history of paroxysmal A-fib on DOAC, HTN, HLD, HFpEF, CKD stage V, GERD, iron deficiency anemia, GI bleed, cervical radiculopathy, osteoarthritis, chronic bilateral lower extremity lymphedema on Unna boots, as reviewed from EMR, presented at [facility], ED with complaining of fall while getting out of her recliner.  As per patient she was trying to get out of her recliner and she fell on the floor, her legs just gave way, denied LOC, no headache or dizziness, no lightheadedness, no chest pain or palpitations.  Patient sustained a wound to her right wrist and was complaining of pain in the right knee.   OT comments  Pt in bed upon therapy arrival and agreeable to participate in OT session. Session focused on bed mobility, functional transfers and standing tolerance. Noticeable decrease in right knee swelling this session. Pt reports more pain in right forearm during session although able to use it to manage RW during functional transfers. Due to increased pain  level at end of session, pt was unable to tolerate legs up while seated in recliner and were kept down.       If plan is discharge home, recommend the following:  A little help with walking and/or transfers;A lot of help with bathing/dressing/bathroom;Help with stairs or ramp for entrance;Assistance with cooking/housework;Assist for transportation   Equipment Recommendations  None recommended by OT       Precautions / Restrictions Precautions Precautions: Fall Precaution Comments: BLE lymphedema with unna boots Restrictions Weight Bearing Restrictions: No       Mobility Bed Mobility Overal bed mobility: Needs Assistance Bed Mobility: Supine to Sit     Supine to sit: HOB elevated, Used rails, Mod assist     General bed mobility comments:  Increased assist required to scoot hips towards EOB using bed pad. Did not require support to RLE and was able to lower to floor independently.    Transfers Overall transfer level: Needs assistance Equipment used: Rolling walker (2 wheels) Transfers: Sit to/from Stand, Bed to chair/wheelchair/BSC Sit to Stand: Mod assist, From elevated surface     Step pivot transfers: Mod assist     General transfer comment: VC for hand placement with RW management and to bring trunk upright and look forward while standing. With increased time, pt was able to step pivot from bed to recliner. Pt tolerated standing to allow OT to assist with toilet hygiene.     Balance Overall balance assessment: Needs assistance, History of Falls Sitting-balance support: No upper extremity supported, Feet supported Sitting balance-Leahy Scale: Fair Sitting balance - Comments: sitting EOB   Standing balance support: Bilateral upper extremity supported, During functional activity, Reliant on assistive device for balance Standing balance-Leahy Scale: Poor Standing balance comment: relied on external support for balance      ADL either performed or assessed with clinical judgement   ADL       Grooming: Wash/dry face;Set up;Sitting             Cognition Arousal: Alert Behavior During Therapy: Lability Overall Cognitive Status: Within Functional Limits for tasks assessed               General Comments Pt stood and was discovered to be incontinent of BM and unaware. Right knee hematoma visually less swollen than initial evaluation.    Pertinent Vitals/ Pain  Pain Assessment Pain Assessment: Faces Faces Pain Scale: Hurts whole lot Pain Location: Right forearm (skin tear area); right knee after transfer Pain Descriptors / Indicators: Aching, Grimacing, Discomfort Pain Intervention(s): Limited activity within patient's tolerance, Monitored during session, Repositioned, RN gave pain meds during  session         Frequency  Min 1X/week        Progress Toward Goals  OT Goals(current goals can now be found in the care plan section)  Progress towards OT goals: Progressing toward goals            AM-PAC OT "6 Clicks" Daily Activity     Outcome Measure   Help from another person eating meals?: None Help from another person taking care of personal grooming?: A Little Help from another person toileting, which includes using toliet, bedpan, or urinal?: Total Help from another person bathing (including washing, rinsing, drying)?: Total Help from another person to put on and taking off regular upper body clothing?: A Little Help from another person to put on and taking off regular lower body clothing?: Total 6 Click Score: 13    End of Session Equipment Utilized During Treatment: Gait belt;Rolling walker (2 wheels)  OT Visit Diagnosis: Unsteadiness on feet (R26.81);Muscle weakness (generalized) (M62.81);Pain;History of falling (Z91.81) Pain - Right/Left: Right Pain - part of body: Knee;Arm   Activity Tolerance Patient tolerated treatment well;Patient limited by pain   Patient Left in chair;with call bell/phone within reach;with chair alarm set;with family/visitor present;with nursing/sitter in room   Nurse Communication Mobility status;Patient requests pain meds;Other (comment) (IV leaking)        Time: 4098-1191 OT Time Calculation (min): 28 min  Charges: OT General Charges $OT Visit: 1 Visit OT Treatments $Self Care/Home Management : 8-22 mins $Therapeutic Activity: 8-22 mins  Limmie Patricia, OTR/L,CBIS  Supplemental OT - [facility] and [facility] Secure Chat Preferred    Gerrald Basu, Charisse March 03/29/2023, 1:35 PM

## 2023-03-29 NOTE — Plan of Care (Signed)

## 2023-03-29 NOTE — TOC Progression Note (Signed)
Transition of Care [facility]) - Progression Note    Patient Details  Name: Tabitha Black MRN: [id number] Date of Birth: 1932/09/30  Transition of Care [facility]) CM/SW Contact  Carley Hammed, LCSW Phone Number: 03/29/2023, 12:33 PM  Clinical Narrative:    Pt has been accepted to return to [facility] when medically stable. Pt will not need a prior auth and has met her 3 midnight stay. TOC will continue to follow for DC needs.    Expected Discharge Plan: Skilled Nursing Facility Barriers to Discharge: Continued Medical Work up, SNF Pending bed offer  Expected Discharge Plan and Services     Post Acute Care Choice: Skilled Nursing Facility Living arrangements for the past 2 months: Single Family Home                                       Social Determinants of Health (SDOH) Interventions SDOH Screenings   Food Insecurity: No Food Insecurity (03/27/2023)  Housing: Low Risk  (03/27/2023)  Transportation Needs: No Transportation Needs (03/27/2023)  Utilities: Not At Risk (03/27/2023)  Tobacco Use: Low Risk  (03/27/2023)    Readmission Risk Interventions    11/16/2022    3:25 PM  Readmission Risk Prevention Plan  Transportation Screening Complete  PCP or Specialist Appt within 3-5 Days Complete  HRI or Home Care Consult Complete  Social Work Consult for Recovery Care Planning/Counseling --  Palliative Care Screening Not Applicable  Medication Review Oceanographer) Complete

## 2023-03-29 NOTE — Consult Note (Signed)
WOC Nurse Consult Note: Reason for Consult: ear lesion with bleeding Wound type: partial thickness; left ear antihelical fold  Pressure Injury POA:NA Measurement: 0.2cm x 0.3cm aprox. Wound ZOX:WRUEA bleeding Drainage (amount, consistency, odor) serosanguinous  Periwound: intact Dressing procedure/placement/frequency: No dressing, apply antibiotic ointment daily  Discussed with patient and her daughter in the room, because this is a recurrent/non healing lesion that bleeds from time to time and current unknown etiology I have recommended follow up with her dermatologist as soon as possible. She has an established derm and her daughter will make her an appt.   Discussed POC with patient and bedside nurse.  Re consult if needed, will not follow at this time. Thanks  Babatunde Seago [employer], RN,CWOCN, CNS, CWON-AP [phone])

## 2023-03-29 NOTE — Progress Notes (Signed)
[facility] Progress Note     Subjective:   Patient continues to feel improvement since admission to the hospital, especially regarding lower extremity swelling. Denies n/v. Lightheadedness, chest pain, or shortness of breath. No episodes of bleeding or worsening bruising. Of note, patient reported that prior to her hospital admission on 12/2022 while on higher dose diuretic regimen, her home weights were around 155lb.   Objective Vital signs in last 24 hours: Vitals:   03/28/23 1613 03/28/23 2111 03/29/23 0453 03/29/23 0801  BP: 134/65 (!) 132/59 (!) 127/55 (!) 133/46  Pulse: 75 85 83 84  Resp: 16 16 16 18   Temp: 97.9 F (36.6 C) 98.5 F (36.9 C) 98.6 F (37 C) 98.6 F (37 C)  TempSrc: Oral Oral Oral Oral  SpO2: 100% 98% 97% 100%  Weight:      Height:       Weight change:  Admission weight: 75.4 kg on 03/26/2023       03/28/2023 71.4 kg       03/29/2023 71.9 kg  Intake/Output Summary (Last 24 hours) at 03/29/2023 0830 Last data filed at 03/29/2023 0400 Gross per 24 hour  Intake 830 ml  Output 2525 ml  Net -1695 ml    Assessment/ Plan: Pt is a 89 y.o. yo female who was admitted on 03/26/2023 with  a fall and carries a medical history significant for  persistent Afib on Eliquis, HTN, HLD, HFpEF, CKD stage V, GI bleed, IDA, GERD, LE lymphedema and currently being treated for volume overload  Assessment/Plan: Renal- CKD stage 5 without dialysis:  -Question AKI on CKD, progression of CKD, vs cardiorenal syndrome 2/2 HFpEF and decreased home diuretics.  -Cr. 3.21 today, improved from 3.32 yesterday. GFR stable at 13 -K 4.4. Will hold any further K supplementation today in setting of CKD5. -Initially in IV lasix drip now s/p 1 day of 80 mg BID IV lasix dosing with 2.8L UOP (2L net negative) -Bed weight today slightly increased. Previous home weights around 155 lb, also documented on outpatient visits. Patient is almost at goal. VSS and improvement in RFP. Given  improvement, will plan for another day of 80 mg IV lasix BID. Will switch to oral lasix dosing on 9/10. -S/p 24 HR urethral catheter for acute urinary retention and mild R hydronephrosis on US renal likely multifactorial (medications, hospitalization leading to retention). Attempt trial of void on 10/11. IF still retaining, repeat Renal US. If persistent hydronephrosis, consider non contrasted CT renal study. -No acute HD needs at this time  HFpEFHypertensionvolumeLymphedema on bilateral lower extremities: continues to improve. MAP and overall BP at goal on Imduer 15 mg daily, Doxasozin 4 mg daily, and Toprol XL 50 mg daily  Anemia AOCKD  IDA hx of GI bleed - stable Hgb today at 8.2. Holding [facility] for permanent atrial fibrillation. S/p 60 mcg Aranesp on 9/9. Scheduled to receive 3rd dose of Venofer 200 mg on 9/10.  CKD-Metabolic bone disease, secondary hyperparathyroidism: Ca stable. Will add RFP to monitor phosphorus tomorrow.   Per primary team: Permanent atrial fibrillation: rate controlled on  Toprol XL 50 mg daily. Holding AC in setting of anemia and prior history of GI bleed.  Mechanical fallTraumatic hematoma of R knee' R wrist wound: pain controlled, improvement in ecchymoses GERD: Continue PPI Functional deconditioning: discharge to SNF for rehab.  Tabitha Black 03/29/23 10:42 AM     Labs: Basic Metabolic Panel: Recent Labs  Lab 03/27/23 0236 03/28/23 0314 03/29/23 0207  NA 138 138 136  K 3.7 3.3* 4.4  CL 100 103 97*  CO2 20* 21* 22  GLUCOSE 115* 95 99  BUN 80* 82* 86*  CREATININE 3.34* 3.32* 3.21*  CALCIUM 9.4 8.8* 8.8*  PHOS 5.4*  --   --    Liver Function Tests: Recent Labs  Lab 03/26/23 1356 03/27/23 0236  AST 37 37  ALT 15 15  ALKPHOS 66 54  BILITOT 1.1 1.0  PROT 7.0 6.1*  ALBUMIN 3.4* 2.9*   No results for input(s): "LIPASE", "AMYLASE" in the last 168 hours. No results for input(s): "AMMONIA" in the last 168 hours. CBC: Recent Labs  Lab  03/26/23 1356 03/27/23 0236 03/28/23 0314 03/29/23 0207  WBC 12.3* 9.6 6.3 8.7  NEUTROABS 11.4*  --   --   --   HGB 9.5* 8.1* 8.2* 8.2*  HCT 30.1* 25.2* 25.6* 26.1*  MCV 96.2 95.1 98.8 95.6  PLT 179 151 135* 147*   Cardiac Enzymes: Recent Labs  Lab 03/26/23 1356 03/27/23 0236 03/28/23 0314  CKTOTAL 656* 552* 360*   CBG: No results for input(s): "GLUCAP" in the last 168 hours.  Iron Studies: No results for input(s): "IRON", "TIBC", "TRANSFERRIN", "FERRITIN" in the last 72 hours. Studies/Results: No results found. Medications: Infusions:  sodium chloride     iron sucrose 200 mg (03/28/23 1218)    Scheduled Medications:  acetaminophen  1,000 mg Oral Q6H   Chlorhexidine Gluconate Cloth  6 each Topical Daily   cholecalciferol  5,000 Units Oral Daily   doxazosin  4 mg Oral Daily   furosemide  80 mg Intravenous BID   HYDROcodone-acetaminophen  1 tablet Oral QHS   isosorbide mononitrate  15 mg Oral QHS   metoprolol succinate  50 mg Oral Daily   pantoprazole  40 mg Oral Daily   potassium chloride  40 mEq Oral Daily   sodium chloride flush  3 mL Intravenous Q12H   sodium chloride flush  3 mL Intravenous Q12H    have reviewed scheduled and prn medications.  Physical Exam: General: Elderly woman in NAD Heart:irregular rate, no murmurs, no JVP, symmetrical Radial pulses Lungs: CTAB, no rales or crackles Abdomen: soft, non distended, normal bowel sounds Extremities: trace bilateral thigh edema. Lower extremities in unna boots, decreased in size from yesterday GU: Foley catheter present without suprapubic tenderness  03/29/2023,8:30 AM  LOS: 3 days

## 2023-03-29 NOTE — Progress Notes (Signed)
Alert and oriented, vss, foley cath in place, 1250 output overnight,CHG bath given .  rt knee swollen. Right arm skin tear; dressing clean, dry and intact. Bilateral unna boots on . Bowel movement overnight, sacral mepilex changed. Left behind ear open cut/crack, bleeding.,patient stated she does not know what condition it is but been having it for a while; it goes and comes back; crack was cleaned with NS, vaseline gauze, dry gauze and tape applied. Wound care consulted. Oriented to room and environment , safety measures in place, poc continue

## 2023-03-29 NOTE — Consult Note (Signed)
   [facility]  [facility] Inpatient Consult   03/29/2023  BRYANAH NURMI July 30, 1932 [id number]  [facility] [[facility]]  Accountable Care Organization [ACO] Patient:  Primary Care Provider:    Patient was reviewed for ***readmission ***high risk score ***length of stay ***barriers to care  Patient was screened for hospitalization and on behalf of ***[facility] ***[facility] Care Coordination to assess for post hospital community care needs.  Patient is being considered for a skilled nursing facility level of care for post hospital transition.  If the patient goes to a [facility] affiliated facility then, patient can be followed by [facility] RN with traditional Medicare and approved Medicare Advantage plans.    Plan:   Will notify the Community [facility] RN can follow for any known or needs for transitional care needs for returning to post facility care coordination needs to return to community.  For questions or referrals, please contact:   Charlesetta Shanks, RN BSN CCM [facility]  [phone] business mobile phone Toll free office [phone]  Fax number: [phone] [email or web address]

## 2023-03-29 NOTE — Progress Notes (Signed)
Foley removed per order. Pt tolerated well.

## 2023-03-30 ENCOUNTER — Inpatient Hospital Stay (HOSPITAL_COMMUNITY): Payer: Medicare Other

## 2023-03-30 DIAGNOSIS — N179 Acute kidney failure, unspecified: Secondary | ICD-10-CM | POA: Diagnosis not present

## 2023-03-30 DIAGNOSIS — D649 Anemia, unspecified: Secondary | ICD-10-CM | POA: Diagnosis not present

## 2023-03-30 DIAGNOSIS — N390 Urinary tract infection, site not specified: Secondary | ICD-10-CM | POA: Diagnosis not present

## 2023-03-30 DIAGNOSIS — I959 Hypotension, unspecified: Secondary | ICD-10-CM | POA: Diagnosis not present

## 2023-03-30 DIAGNOSIS — I5030 Unspecified diastolic (congestive) heart failure: Secondary | ICD-10-CM | POA: Diagnosis not present

## 2023-03-30 DIAGNOSIS — M5412 Radiculopathy, cervical region: Secondary | ICD-10-CM | POA: Diagnosis not present

## 2023-03-30 DIAGNOSIS — S61501D Unspecified open wound of right wrist, subsequent encounter: Secondary | ICD-10-CM | POA: Diagnosis not present

## 2023-03-30 DIAGNOSIS — S8002XA Contusion of left knee, initial encounter: Secondary | ICD-10-CM | POA: Diagnosis not present

## 2023-03-30 DIAGNOSIS — M25561 Pain in right knee: Secondary | ICD-10-CM | POA: Diagnosis not present

## 2023-03-30 DIAGNOSIS — M1990 Unspecified osteoarthritis, unspecified site: Secondary | ICD-10-CM | POA: Diagnosis not present

## 2023-03-30 DIAGNOSIS — B351 Tinea unguium: Secondary | ICD-10-CM | POA: Diagnosis not present

## 2023-03-30 DIAGNOSIS — E1121 Type 2 diabetes mellitus with diabetic nephropathy: Secondary | ICD-10-CM | POA: Diagnosis not present

## 2023-03-30 DIAGNOSIS — K5900 Constipation, unspecified: Secondary | ICD-10-CM | POA: Diagnosis not present

## 2023-03-30 DIAGNOSIS — S8001XD Contusion of right knee, subsequent encounter: Secondary | ICD-10-CM | POA: Diagnosis not present

## 2023-03-30 DIAGNOSIS — R609 Edema, unspecified: Secondary | ICD-10-CM | POA: Diagnosis not present

## 2023-03-30 DIAGNOSIS — D509 Iron deficiency anemia, unspecified: Secondary | ICD-10-CM | POA: Diagnosis not present

## 2023-03-30 DIAGNOSIS — K922 Gastrointestinal hemorrhage, unspecified: Secondary | ICD-10-CM | POA: Diagnosis not present

## 2023-03-30 DIAGNOSIS — W19XXXD Unspecified fall, subsequent encounter: Secondary | ICD-10-CM | POA: Diagnosis not present

## 2023-03-30 DIAGNOSIS — R799 Abnormal finding of blood chemistry, unspecified: Secondary | ICD-10-CM | POA: Diagnosis not present

## 2023-03-30 DIAGNOSIS — W19XXXA Unspecified fall, initial encounter: Secondary | ICD-10-CM | POA: Diagnosis not present

## 2023-03-30 DIAGNOSIS — N281 Cyst of kidney, acquired: Secondary | ICD-10-CM | POA: Diagnosis not present

## 2023-03-30 DIAGNOSIS — M79676 Pain in unspecified toe(s): Secondary | ICD-10-CM | POA: Diagnosis not present

## 2023-03-30 DIAGNOSIS — I10 Essential (primary) hypertension: Secondary | ICD-10-CM | POA: Diagnosis not present

## 2023-03-30 DIAGNOSIS — E119 Type 2 diabetes mellitus without complications: Secondary | ICD-10-CM | POA: Diagnosis not present

## 2023-03-30 DIAGNOSIS — Z7901 Long term (current) use of anticoagulants: Secondary | ICD-10-CM | POA: Diagnosis not present

## 2023-03-30 DIAGNOSIS — I480 Paroxysmal atrial fibrillation: Secondary | ICD-10-CM | POA: Diagnosis not present

## 2023-03-30 DIAGNOSIS — I5032 Chronic diastolic (congestive) heart failure: Secondary | ICD-10-CM | POA: Diagnosis not present

## 2023-03-30 DIAGNOSIS — I120 Hypertensive chronic kidney disease with stage 5 chronic kidney disease or end stage renal disease: Secondary | ICD-10-CM | POA: Diagnosis not present

## 2023-03-30 DIAGNOSIS — N185 Chronic kidney disease, stage 5: Secondary | ICD-10-CM | POA: Diagnosis not present

## 2023-03-30 DIAGNOSIS — K8050 Calculus of bile duct without cholangitis or cholecystitis without obstruction: Secondary | ICD-10-CM | POA: Diagnosis not present

## 2023-03-30 DIAGNOSIS — M25531 Pain in right wrist: Secondary | ICD-10-CM | POA: Diagnosis not present

## 2023-03-30 DIAGNOSIS — D631 Anemia in chronic kidney disease: Secondary | ICD-10-CM | POA: Diagnosis not present

## 2023-03-30 DIAGNOSIS — R2681 Unsteadiness on feet: Secondary | ICD-10-CM | POA: Diagnosis not present

## 2023-03-30 DIAGNOSIS — K439 Ventral hernia without obstruction or gangrene: Secondary | ICD-10-CM | POA: Diagnosis not present

## 2023-03-30 DIAGNOSIS — R112 Nausea with vomiting, unspecified: Secondary | ICD-10-CM | POA: Diagnosis not present

## 2023-03-30 DIAGNOSIS — K219 Gastro-esophageal reflux disease without esophagitis: Secondary | ICD-10-CM | POA: Diagnosis not present

## 2023-03-30 DIAGNOSIS — S8001XA Contusion of right knee, initial encounter: Secondary | ICD-10-CM | POA: Diagnosis not present

## 2023-03-30 DIAGNOSIS — I132 Hypertensive heart and chronic kidney disease with heart failure and with stage 5 chronic kidney disease, or end stage renal disease: Secondary | ICD-10-CM | POA: Diagnosis not present

## 2023-03-30 DIAGNOSIS — R54 Age-related physical debility: Secondary | ICD-10-CM | POA: Diagnosis not present

## 2023-03-30 DIAGNOSIS — N1330 Unspecified hydronephrosis: Secondary | ICD-10-CM | POA: Diagnosis not present

## 2023-03-30 DIAGNOSIS — I890 Lymphedema, not elsewhere classified: Secondary | ICD-10-CM | POA: Diagnosis not present

## 2023-03-30 DIAGNOSIS — E1122 Type 2 diabetes mellitus with diabetic chronic kidney disease: Secondary | ICD-10-CM | POA: Diagnosis not present

## 2023-03-30 DIAGNOSIS — N184 Chronic kidney disease, stage 4 (severe): Secondary | ICD-10-CM | POA: Diagnosis not present

## 2023-03-30 DIAGNOSIS — N2581 Secondary hyperparathyroidism of renal origin: Secondary | ICD-10-CM | POA: Diagnosis not present

## 2023-03-30 DIAGNOSIS — L89151 Pressure ulcer of sacral region, stage 1: Secondary | ICD-10-CM | POA: Diagnosis not present

## 2023-03-30 DIAGNOSIS — I4891 Unspecified atrial fibrillation: Secondary | ICD-10-CM | POA: Diagnosis not present

## 2023-03-30 DIAGNOSIS — Z7401 Bed confinement status: Secondary | ICD-10-CM | POA: Diagnosis not present

## 2023-03-30 DIAGNOSIS — E782 Mixed hyperlipidemia: Secondary | ICD-10-CM | POA: Diagnosis not present

## 2023-03-30 DIAGNOSIS — N250 Renal osteodystrophy: Secondary | ICD-10-CM | POA: Diagnosis not present

## 2023-03-30 LAB — CBC
HCT: 27.2 % — ABNORMAL LOW (ref 36.0–46.0)
Hemoglobin: 8.6 g/dL — ABNORMAL LOW (ref 12.0–15.0)
MCH: 30.4 pg (ref 26.0–34.0)
MCHC: 31.6 g/dL (ref 30.0–36.0)
MCV: 96.1 fL (ref 80.0–100.0)
Platelets: 157 10*3/uL (ref 150–400)
RBC: 2.83 MIL/uL — ABNORMAL LOW (ref 3.87–5.11)
RDW: 13.8 % (ref 11.5–15.5)
WBC: 7.5 10*3/uL (ref 4.0–10.5)
nRBC: 0.3 % — ABNORMAL HIGH (ref 0.0–0.2)

## 2023-03-30 LAB — MAGNESIUM: Magnesium: 1.7 mg/dL (ref 1.7–2.4)

## 2023-03-30 LAB — RENAL FUNCTION PANEL
Albumin: 2.6 g/dL — ABNORMAL LOW (ref 3.5–5.0)
Anion gap: 14 (ref 5–15)
BUN: 86 mg/dL — ABNORMAL HIGH (ref 8–23)
CO2: 23 mmol/L (ref 22–32)
Calcium: 8.9 mg/dL (ref 8.9–10.3)
Chloride: 97 mmol/L — ABNORMAL LOW (ref 98–111)
Creatinine, Ser: 3.25 mg/dL — ABNORMAL HIGH (ref 0.44–1.00)
GFR, Estimated: 13 mL/min — ABNORMAL LOW (ref >60)
Glucose, Bld: 99 mg/dL (ref 70–99)
Phosphorus: 4.1 mg/dL (ref 2.5–4.6)
Potassium: 4.1 mmol/L (ref 3.5–5.1)
Sodium: 134 mmol/L — ABNORMAL LOW (ref 135–145)

## 2023-03-30 MED ORDER — ACETAMINOPHEN 325 MG PO TABS: 650.0000 mg | ORAL_TABLET | Freq: Four times a day (QID) | ORAL | Status: AC | PRN

## 2023-03-30 MED ORDER — BISACODYL 5 MG PO TBEC: 10.0000 mg | DELAYED_RELEASE_TABLET | Freq: Every day | ORAL | Status: AC | PRN

## 2023-03-30 MED ORDER — ONDANSETRON HCL 4 MG PO TABS: 4.0000 mg | ORAL_TABLET | Freq: Four times a day (QID) | ORAL | Status: AC | PRN

## 2023-03-30 MED ORDER — FUROSEMIDE 40 MG PO TABS: ORAL_TABLET | ORAL | Status: AC

## 2023-03-30 MED ORDER — APIXABAN 2.5 MG PO TABS
2.5000 mg | ORAL_TABLET | Freq: Two times a day (BID) | ORAL | Status: AC
Start: 2023-03-31 — End: ?

## 2023-03-30 MED ORDER — POLYETHYLENE GLYCOL 3350 17 G PO PACK: 17.0000 g | PACK | Freq: Every day | ORAL | Status: AC | PRN

## 2023-03-30 MED ORDER — OXYCODONE HCL 5 MG PO TABS: 5.0000 mg | ORAL_TABLET | ORAL | 0 refills | Status: AC | PRN

## 2023-03-30 MED ORDER — DOUBLE ANTIBIOTIC 500-10000 UNIT/GM EX OINT: 1.0000 | TOPICAL_OINTMENT | Freq: Every day | CUTANEOUS | Status: AC

## 2023-03-30 NOTE — Care Management Important Message (Signed)
Important Message  Patient Details  Name: Tabitha Black MRN: [id number] Date of Birth: 1932-10-10   Medicare Important Message Given:  Yes     Sherilyn Banker 03/30/2023, 3:06 PM

## 2023-03-30 NOTE — Discharge Summary (Signed)
Physician Discharge Summary  IBIS STEVEN MVH:[id number] DOB: [birth year] DOA: 03/26/2023  PCP: Irven Coe, MD  Admit date: 03/26/2023 Discharge date: 03/30/2023  Admitted From: Home  Discharge disposition: SNF   Recommendations for Outpatient Follow-Up:   Follow up with your primary care provider at the skilled nursing facility in 3 to 5 days. Check CBC, BMP, magnesium in the next visit Follow-up with nephrology Dr. Allena Katz on 04/18/2023 as has been scheduled. Patient did have traumatic hematoma of the knee area but no hemarthrosis.  Plan to resume Eliquis from 03/31/2023.  Discharge Diagnosis:   Principal Problem:   Traumatic hematoma of knee Active Problems:   Age-related physical debility   (HFpEF) heart failure with preserved ejection fraction (HCC)   Chronic kidney disease (CKD), active medical management without dialysis, stage 5 (HCC)   HTN (hypertension)   DM2 (diabetes mellitus, type 2) (HCC)   Mixed hyperlipidemia   Pressure injury of skin   Discharge Condition: Improved.  Diet recommendation: Low sodium, heart healthy.  Carbohydrate-modified.    Wound care: None.  Code status: Full.  History of Present Illness:   Tabitha Black is a 89 y.o. female with past medical history of paroxysmal A-fib on anticoagulation, hypertension, hyperlipidemia, HFpEF, CKD stage V, GERD, iron deficiency anemia, GI bleed, cervical radiculopathy, osteoarthritis, chronic bilateral lower extremity lymphedema on Unna boots, presented to hospital after sustaining a fall while getting out of her recliner since her legs just gave way. Patient got wound on her right wrist and was complaining of pain in the right knee.  In the ED vitals were stable.  Labs showed creatinine of 3.4 with albumin 3.4 CK was 656, had mild leukocytosis at 12.3.  Hemoglobin was low at 9.5.  CT head and C-spine was negative for any fractures.  X-ray of the right wrist and right knee without any fractures.  Patient was then  considered for admission to hospital for further evaluation and treatment.   Hospital Course:   Following conditions were addressed during hospitalization as listed below,  Traumatic hematoma of right knee s/p fall Likely contributed by being on Eliquis.  Request was on hold during hospitalization.  X-ray of the right knee without any fracture has prepatellar bruising.  Continue ice, supportive care.  Imaging showed large prepatellar hematoma measuring 12 x 1.7 x 11.3 cm with trace knee effusion.  At this time, patient has been seen by physical therapy and recommendation is for skilled nursing facility on discharge.  Patient has been able to ambulate.     Right wrist wound s/p fall Continue local dressing.  CK level mildly elevated at 360.    AKI on CKD stage V Baseline creatinine 2.6.  Creatinine was 3.4 on admission.  Renal ultrasound showed interval development of mild right hydronephrosis likely secondary to urinary retention so in and out catheterization was done followed by Foley catheter placement.  Foley catheter has been removed at this time and postvoid residual volume is minimal.  Seen by nephrology today and patient is stable for disposition home.  During hospitalization patient received 80 mg of IV Lasix twice a day.  At this time patient has been recommended Lasix 120 mg in the morning and 80 mg orally at evening.  Will follow-up with Dr. Allena Katz nephrology on 04/18/2023.     Chronic lymphedema bilateral lower extremities Continue Unna boots.     Paroxysmal A-fib, HTN, HLD, HFpEF Continue Toprol and Imdur.  Will resume Eliquis on discharge.    GERD continue  PPI    Iron deficiency anemia, anemia of chronic kidney disease transferrin saturation 10%.  Received Venofer 200 mg IV daily during hospitalization.  Will need to follow-up with nephrology as outpatient.   Hypokalemia.  Replenished and improved.  Latest potassium of 4.1.   Disposition.  At this time, patient is stable for  disposition to skilled nursing facility with outpatient PCP and nephrology follow-up.  Spoke with the patient's daughter prior to disposition.  Medical Consultants:   Nephrology   Procedures:    Foley catheter placement and removal. Subjective:   Today, patient was seen and examined at bedside.  Feels okay.  Denies any nausea vomiting fever chills or rigor.  Patient's daughter at bedside.  Minimal postvoid residual urine.  Discharge Exam:   Vitals:   03/30/23 0615 03/30/23 0758  BP: (!) 144/57 (!) 149/54  Pulse: 84 67  Resp: 18 17  Temp: 98.1 F (36.7 C) 97.8 F (36.6 C)  SpO2: 97% 100%   Vitals:   03/29/23 2118 03/30/23 0500 03/30/23 0615 03/30/23 0758  BP: 134/60  (!) 144/57 (!) 149/54  Pulse: 86  84 67  Resp: 16  18 17   Temp: 98 F (36.7 C)  98.1 F (36.7 C) 97.8 F (36.6 C)  TempSrc: Oral  Oral Oral  SpO2: 99%  97% 100%  Weight:  71.3 kg    Height:       Body mass index is 27.84 kg/m.   General: Alert awake, not in obvious distress, elderly female, Communicative HENT: pupils equally reacting to light,  No scleral pallor or icterus noted. Oral mucosa is moist.  Chest:  Clear breath sounds.   No crackles or wheezes.  CVS: S1 &S2 heard. No murmur.  Regular rate and rhythm. Abdomen: Soft, nontender, nondistended.  Bowel sounds are heard.   Extremities: No cyanosis, clubbing with bilateral lower extremity Unna boots in place.   Right knee area with hematoma. Psych: Alert, awake and oriented, normal mood CNS:  No cranial nerve deficits.  Power equal in all extremities.   Skin: Warm and dry.  No rashes noted.  The results of significant diagnostics from this hospitalization (including imaging, microbiology, ancillary and laboratory) are listed below for reference.     Diagnostic Studies:   US RENAL  Result Date: 03/26/2023 CLINICAL DATA:  Acute kidney injury EXAM: RENAL / URINARY TRACT ULTRASOUND COMPLETE COMPARISON:  CT 01/10/2023 FINDINGS: Right Kidney: Renal  measurements: 7.6 x 3.8 x 4.7 cm = volume: 71 mL. Moderate diffuse cortical atrophy and diffusely increased renal cortical echogenicity noted in keeping with changes of underlying medical renal disease. The lower pole of the right kidney is obscured by overlying bowel gas. Mild right hydronephrosis has developed, new since prior examination. Left Kidney: Renal measurements: 7.5 x 4.3 x 4.0 cm = volume: 67 mL. Visualization of the left kidney is limited by overlying bowel gas and osseous structures. The visualized left renal cortex, however, appears atrophic and is diffusely increased in echogenicity suggesting changes of underlying medical renal disease. No hydronephrosis. No intrarenal masses or calcifications within the visualized portion. Bladder: Appears normal for degree of bladder distention. Other: Incidentally noted is a 16 mm shadowing calculus within the proximal common bile duct. The duct appears moderately dilated measuring 12 mm in diameter. IMPRESSION: 1. Interval development of mild right hydronephrosis. 2. Limited visualization of the kidneys. Bilateral renal cortical atrophy and increased renal cortical echogenicity in keeping with changes of underlying medical renal disease. 3. 16 mm calculus within the  proximal common bile duct with associated moderate biliary ductal dilatation. This can be confirmed with ERCP or MRCP examination. Electronically Signed   By: Helyn Numbers M.D.   On: 03/26/2023 20:47   CT KNEE RIGHT WO CONTRAST  Result Date: 03/26/2023 CLINICAL DATA:  Right knee pain after fall EXAM: CT OF THE RIGHT KNEE WITHOUT CONTRAST TECHNIQUE: Multidetector CT imaging of the right knee was performed according to the standard protocol. Multiplanar CT image reconstructions were also generated. RADIATION DOSE REDUCTION: This exam was performed according to the departmental dose-optimization program which includes automated exposure control, adjustment of the mA and/or kV according to patient  size and/or use of iterative reconstruction technique. COMPARISON:  X-ray 03/26/2023 FINDINGS: Bones/Joint/Cartilage Postsurgical changes from right total knee arthroplasty. Arthroplasty components are in their expected alignment. No periprosthetic lucency or fracture identified. Specifically, no patellar fracture. Trace knee joint effusion. No lipohemarthrosis. Ligaments Suboptimally assessed by CT. Muscles and Tendons No acute musculotendinous abnormality by CT. Soft tissues Large hyperdense prepatellar hematoma measuring up to 12.0 x 1.7 x 11.3 cm (volume = 120 cm^3). Circumferential subcutaneous edema throughout the visualized right leg. IMPRESSION: 1. Large prepatellar hematoma measuring up to 12.0 x 1.7 x 11.3 cm (volume = 120 cc). 2. Postsurgical changes from right total knee arthroplasty. No fracture or evidence of hardware complication. 3. Trace knee joint effusion. No lipohemarthrosis. 4. Circumferential subcutaneous edema throughout the visualized right leg. Electronically Signed   By: Duanne Guess D.O.   On: 03/26/2023 20:27   CT Head Wo Contrast  Result Date: 03/26/2023 CLINICAL DATA:  Trauma and head injury. EXAM: CT HEAD WITHOUT CONTRAST CT CERVICAL SPINE WITHOUT CONTRAST TECHNIQUE: Multidetector CT imaging of the head and cervical spine was performed following the standard protocol without intravenous contrast. Multiplanar CT image reconstructions of the cervical spine were also generated. RADIATION DOSE REDUCTION: This exam was performed according to the departmental dose-optimization program which includes automated exposure control, adjustment of the mA and/or kV according to patient size and/or use of iterative reconstruction technique. COMPARISON:  None Available. FINDINGS: Evaluation of this exam is limited due to motion artifact. CT HEAD FINDINGS Brain: Moderate age-related atrophy and chronic microvascular ischemic changes. There is no acute intracranial hemorrhage. No mass effect or  midline shift. No extra-axial fluid collection. Vascular: No hyperdense vessel or unexpected calcification. Skull: Normal. Negative for fracture or focal lesion. Sinuses/Orbits: No acute finding. Other: None CT CERVICAL SPINE FINDINGS Alignment: No acute subluxation. Grade 1 C4-C5 and T1-T2 anterolisthesis. Skull base and vertebrae: No acute fracture. Advanced osteopenia. There is incomplete bony fusion of the posterior ring of C1. Soft tissues and spinal canal: No prevertebral fluid or swelling. No visible canal hematoma. Disc levels: Multilevel degenerative changes and multilevel facet arthropathy. Upper chest: No acute findings. Other: Bilateral carotid bulb calcified plaques. IMPRESSION: 1. No acute intracranial pathology. Moderate age-related atrophy and chronic microvascular ischemic changes. 2. No acute/traumatic cervical spine pathology. Electronically Signed   By: Elgie Collard M.D.   On: 03/26/2023 17:09   CT Cervical Spine Wo Contrast  Result Date: 03/26/2023 CLINICAL DATA:  Trauma and head injury. EXAM: CT HEAD WITHOUT CONTRAST CT CERVICAL SPINE WITHOUT CONTRAST TECHNIQUE: Multidetector CT imaging of the head and cervical spine was performed following the standard protocol without intravenous contrast. Multiplanar CT image reconstructions of the cervical spine were also generated. RADIATION DOSE REDUCTION: This exam was performed according to the departmental dose-optimization program which includes automated exposure control, adjustment of the mA and/or kV according to patient  size and/or use of iterative reconstruction technique. COMPARISON:  None Available. FINDINGS: Evaluation of this exam is limited due to motion artifact. CT HEAD FINDINGS Brain: Moderate age-related atrophy and chronic microvascular ischemic changes. There is no acute intracranial hemorrhage. No mass effect or midline shift. No extra-axial fluid collection. Vascular: No hyperdense vessel or unexpected calcification. Skull:  Normal. Negative for fracture or focal lesion. Sinuses/Orbits: No acute finding. Other: None CT CERVICAL SPINE FINDINGS Alignment: No acute subluxation. Grade 1 C4-C5 and T1-T2 anterolisthesis. Skull base and vertebrae: No acute fracture. Advanced osteopenia. There is incomplete bony fusion of the posterior ring of C1. Soft tissues and spinal canal: No prevertebral fluid or swelling. No visible canal hematoma. Disc levels: Multilevel degenerative changes and multilevel facet arthropathy. Upper chest: No acute findings. Other: Bilateral carotid bulb calcified plaques. IMPRESSION: 1. No acute intracranial pathology. Moderate age-related atrophy and chronic microvascular ischemic changes. 2. No acute/traumatic cervical spine pathology. Electronically Signed   By: Elgie Collard M.D.   On: 03/26/2023 17:09   DG Wrist Complete Right  Result Date: 03/26/2023 CLINICAL DATA:  Right wrist pain status post fall EXAM: RIGHT WRIST - COMPLETE 3+ VIEW COMPARISON:  None Available. FINDINGS: No acute fracture or dislocation. No aggressive osseous lesion. Normal alignment. Generalized osteopenia. Moderate osteoarthritis of the first MCP joint. Severe osteoarthritis of the first IP joint. Moderate osteoarthritis of the first MCP joint. Chondrocalcinosis of the TFCC and scapholunate ligament as can be seen with CPPD. Soft tissue are unremarkable. No radiopaque foreign body or soft tissue emphysema. Peripheral vascular atherosclerotic disease. IMPRESSION: 1. No acute osseous injury of the right wrist. 2. Moderate osteoarthritis of the first MCP joint. Severe osteoarthritis of the first IP joint. Electronically Signed   By: Elige Ko M.D.   On: 03/26/2023 14:58   DG Knee Complete 4 Views Right  Result Date: 03/26/2023 CLINICAL DATA:  Status post fall, right knee pain EXAM: RIGHT KNEE - COMPLETE 4+ VIEW COMPARISON:  None Available. FINDINGS: Right knee demonstrates a total knee arthroplasty without evidence of hardware failure  complication. No significant joint effusion. No fracture or dislocation. Alignment is anatomic. IMPRESSION: 1. Right total knee arthroplasty. 2.  No acute osseous injury of the right knee. Electronically Signed   By: Elige Ko M.D.   On: 03/26/2023 14:57     Labs:   Basic Metabolic Panel: Recent Labs  Lab 03/26/23 1356 03/27/23 0236 03/28/23 0314 03/29/23 0207 03/30/23 0210  NA 137 138 138 136 134*  K 4.0 3.7 3.3* 4.4 4.1  CL 102 100 103 97* 97*  CO2 20* 20* 21* 22 23  GLUCOSE 127* 115* 95 99 99  BUN 77* 80* 82* 86* 86*  CREATININE 3.48* 3.34* 3.32* 3.21* 3.25*  CALCIUM 9.7 9.4 8.8* 8.8* 8.9  MG  --  1.7 1.7 1.8 1.7  PHOS  --  5.4*  --   --  4.1   GFR Estimated Creatinine Clearance: 11.3 mL/min (A) (by C-G formula based on SCr of 3.25 mg/dL (H)). Liver Function Tests: Recent Labs  Lab 03/26/23 1356 03/27/23 0236 03/30/23 0210  AST 37 37  --   ALT 15 15  --   ALKPHOS 66 54  --   BILITOT 1.1 1.0  --   PROT 7.0 6.1*  --   ALBUMIN 3.4* 2.9* 2.6*   No results for input(s): "LIPASE", "AMYLASE" in the last 168 hours. No results for input(s): "AMMONIA" in the last 168 hours. Coagulation profile Recent Labs  Lab 03/27/23 0236  INR 1.5*    CBC: Recent Labs  Lab 03/26/23 1356 03/27/23 0236 03/28/23 0314 03/29/23 0207 03/30/23 0210  WBC 12.3* 9.6 6.3 8.7 7.5  NEUTROABS 11.4*  --   --   --   --   HGB 9.5* 8.1* 8.2* 8.2* 8.6*  HCT 30.1* 25.2* 25.6* 26.1* 27.2*  MCV 96.2 95.1 98.8 95.6 96.1  PLT 179 151 135* 147* 157   Cardiac Enzymes: Recent Labs  Lab 03/26/23 1356 03/27/23 0236 03/28/23 0314  CKTOTAL 656* 552* 360*   BNP: Invalid input(s): "POCBNP" CBG: No results for input(s): "GLUCAP" in the last 168 hours. D-Dimer No results for input(s): "DDIMER" in the last 72 hours. Hgb A1c No results for input(s): "HGBA1C" in the last 72 hours. Lipid Profile No results for input(s): "CHOL", "HDL", "LDLCALC", "TRIG", "CHOLHDL", "LDLDIRECT" in the last 72  hours. Thyroid function studies No results for input(s): "TSH", "T4TOTAL", "T3FREE", "THYROIDAB" in the last 72 hours.  Invalid input(s): "FREET3" Anemia work up No results for input(s): "VITAMINB12", "FOLATE", "FERRITIN", "TIBC", "IRON", "RETICCTPCT" in the last 72 hours. Microbiology No results found for this or any previous visit (from the past 240 hour(s)).   Discharge Instructions:   Discharge Instructions     Diet - low sodium heart healthy   Complete by: As directed    Fluid restriction 1500 mL/day.   Diet Carb Modified   Complete by: As directed    Discharge instructions   Complete by: As directed    Follow-up with your primary care provider at the skilled nursing facility in 3 to 5 days.  Check blood work at that time.  Take medications as prescribed , seek medical attention for worsening symptoms.  Follow-up with nephrology as outpatient as has been scheduled on 04/18/2023 with Dr. Allena Katz.   Discharge wound care:   Complete by: As directed    Single layer of xeroform to the right arm skin tear, cover with gauze and kerlix. Change every other day   Increase activity slowly   Complete by: As directed       Allergies as of 03/30/2023       Reactions   Other Swelling   Topical mycin, unsure of which one, caused opthalmic swelling Erythromycin    Penicillins Shortness Of Breath, Itching, Swelling, Rash   Lovastatin Other (See Comments)   Weakness and myalgias, maybe to statins?   Azithromycin Other (See Comments)   Patient doesn't remember having a reaction to this.   Clindamycin Other (See Comments)   Patient doesn't remember having a reaction to this.   Hydralazine Other (See Comments)   Felt foggy   Lisinopril Other (See Comments)   Hyperkalemia   Neomycin Sulfate [neomycin] Other (See Comments)   Redness and burning in face   Niacin Hives   Facial swelling and redness   Rofecoxib Other (See Comments)   Unknown reaction   Tetracycline    Valsartan Other  (See Comments)   Hyperkalemia   Vytorin [ezetimibe-simvastatin] Other (See Comments)   myalgias   Zetia [ezetimibe] Other (See Comments)   Leg pain        Medication List     TAKE these medications    acetaminophen 325 MG tablet Commonly known as: TYLENOL Take 2 tablets (650 mg total) by mouth every 6 (six) hours as needed for mild pain, fever or moderate pain (or Fever >/= 101). What changed:  medication strength how much to take when to take this reasons to take this   apixaban  2.5 MG Tabs tablet Commonly known as: ELIQUIS Take 1 tablet (2.5 mg total) by mouth 2 (two) times daily. Start taking on: March 31, 2023   bisacodyl 5 MG EC tablet Commonly known as: DULCOLAX Take 2 tablets (10 mg total) by mouth daily as needed for moderate constipation.   doxazosin 4 MG tablet Commonly known as: CARDURA Take 4 mg by mouth daily.   furosemide 40 MG tablet Commonly known as: LASIX 3 tabs (120 mg) in a.m. and 2 tabs (80 mg) in the evening. What changed:  medication strength how much to take how to take this when to take this additional instructions   Hair Skin and Nails Formula Tabs Take 1 tablet by mouth daily.   HYDROcodone-acetaminophen 5-325 MG tablet Commonly known as: NORCO/VICODIN Take 1 tablet by mouth at bedtime.   isosorbide mononitrate 30 MG 24 hr tablet Commonly known as: IMDUR Take 1/2 tablet (15 mg) by mouth at bedtime.   metoprolol succinate 50 MG 24 hr tablet Commonly known as: TOPROL-XL Take 1 tablet (50 mg total) by mouth every evening. Take with or immediately following a meal.   ondansetron 4 MG tablet Commonly known as: ZOFRAN Take 1 tablet (4 mg total) by mouth every 6 (six) hours as needed for nausea or vomiting.   oxyCODONE 5 MG immediate release tablet Commonly known as: Oxy IR/ROXICODONE Take 1 tablet (5 mg total) by mouth every 4 (four) hours as needed for moderate pain.   pantoprazole 40 MG tablet Commonly known as:  PROTONIX Take 40 mg by mouth daily.   polyethylene glycol 17 g packet Commonly known as: MIRALAX / GLYCOLAX Take 17 g by mouth daily as needed for mild constipation or moderate constipation.   polymixin-bacitracin 500-10000 UNIT/GM Oint ointment Apply 1 Application topically daily. To left ear   Vitamin D3 125 MCG (5000 UT) Tabs Take 5,000 Units by mouth daily.               Discharge Care Instructions  (From admission, onward)           Start     Ordered   03/30/23 0000  Discharge wound care:       Comments: Single layer of xeroform to the right arm skin tear, cover with gauze and kerlix. Change every other day   03/30/23 1058              Time coordinating discharge: 39 minutes  Signed:  Jailyne Black  [facility] 03/30/2023, 11:43 AM

## 2023-03-30 NOTE — Progress Notes (Signed)
Physical Therapy Treatment Patient Details Name: Tabitha Black MRN: [id number] DOB: Nov 01, 1932 Today's Date: 03/30/2023   History of Present Illness 89 y.o. female with Past medical history of paroxysmal A-fib on DOAC, HTN, HLD, HFpEF, CKD stage V, GERD, iron deficiency anemia, GI bleed, cervical radiculopathy, osteoarthritis, chronic bilateral lower extremity lymphedema on Unna boots, as reviewed from EMR, presented at [facility], ED with complaining of fall while getting out of her recliner.  As per patient she was trying to get out of her recliner and she fell on the floor, her legs just gave way, denied LOC, no headache or dizziness, no lightheadedness, no chest pain or palpitations.  Patient sustained a wound to her right wrist and was complaining of pain in the right knee.    PT Comments  Pt supine in bed with tray table and breakfast out of reach. PT encouraged pt to get up to recliner for breakfast and pt reluctantly agreeable. Pt is limited in safe mobility by R LE pain, generalized weakness, fear of falling and increased posterior lean with transfers. D/c plan remains appropriate. Pt hopeful to discharge today.     If plan is discharge home, recommend the following: A little help with walking and/or transfers;Assist for transportation;Assistance with cooking/housework;Help with stairs or ramp for entrance   Can travel by private vehicle     No  Equipment Recommendations  None recommended by PT       Precautions / Restrictions Precautions Precautions: Fall Precaution Comments: BLE lymphedema with unna boots Restrictions Weight Bearing Restrictions: No     Mobility  Bed Mobility Overal bed mobility: Needs Assistance Bed Mobility: Supine to Sit     Supine to sit: HOB elevated, Used rails, Mod assist     General bed mobility comments: Increased assist required to scoot hips towards EOB using bed pad. able to pull against therapist with R UE to pivot hips in bed. Did not  require support to RLE and was able to lower to floor independently.    Transfers Overall transfer level: Needs assistance Equipment used: Rolling walker (2 wheels) Transfers: Sit to/from Stand, Bed to chair/wheelchair/BSC Sit to Stand: Mod assist   Step pivot transfers: Mod assist, Max assist       General transfer comment: pt with increased posterior lean on standing requiring modA for steadying and maximal multimodal cuing for hip rotation and upright posture to bring CoG over BoS, mod-maxA for posterior support due to increased posterior lean with stepping transfer to chair    Ambulation/Gait               General Gait Details: only agreeable to get up to chair to eat breakfast         Balance Overall balance assessment: Needs assistance, History of Falls Sitting-balance support: No upper extremity supported, Feet supported Sitting balance-Leahy Scale: Fair Sitting balance - Comments: sitting EOB   Standing balance support: Bilateral upper extremity supported, During functional activity, Reliant on assistive device for balance Standing balance-Leahy Scale: Poor Standing balance comment: relied on external support for balance                            Cognition Arousal: Alert Behavior During Therapy: WFL for tasks assessed/performed, Flat affect Overall Cognitive Status: Within Functional Limits for tasks assessed  General Comments General comments (skin integrity, edema, etc.): VSS on RA, reports she is going to Rehab today, pt notes that edema in her R knee has improved greatly      Pertinent Vitals/Pain Pain Assessment Pain Assessment: Faces Faces Pain Scale: Hurts even more Pain Location: Right knee Pain Descriptors / Indicators: Aching, Grimacing, Discomfort Pain Intervention(s): Limited activity within patient's tolerance, Monitored during session, Repositioned     PT Goals  (current goals can now be found in the care plan section) Acute Rehab PT Goals Patient Stated Goal: be able to safely get home PT Goal Formulation: With patient Time For Goal Achievement: 04/10/23 Potential to Achieve Goals: Good Progress towards PT goals: Not progressing toward goals - comment    Frequency    Min 1X/week       AM-PAC PT "6 Clicks" Mobility   Outcome Measure  Help needed turning from your back to your side while in a flat bed without using bedrails?: A Little Help needed moving from lying on your back to sitting on the side of a flat bed without using bedrails?: A Lot Help needed moving to and from a bed to a chair (including a wheelchair)?: A Lot Help needed standing up from a chair using your arms (e.g., wheelchair or bedside chair)?: A Lot Help needed to walk in hospital room?: A Lot Help needed climbing 3-5 steps with a railing? : A Lot 6 Click Score: 13    End of Session Equipment Utilized During Treatment: Gait belt Activity Tolerance: Patient tolerated treatment well;Patient limited by fatigue;Patient limited by pain Patient left: in chair;with call bell/phone within reach;with family/visitor present;with chair alarm set Nurse Communication: Mobility status PT Visit Diagnosis: Other abnormalities of gait and mobility (R26.89);History of falling (Z91.81)     Time: 5409-8119 PT Time Calculation (min) (ACUTE ONLY): 24 min  Charges:    $Therapeutic Activity: 23-37 mins PT General Charges $$ ACUTE PT VISIT: 1 Visit                     Gale Hulse B. Beverely Risen PT, DPT Acute Rehabilitation Services Please use secure chat or  Call Office [phone]    Elon Alas [facility] 03/30/2023, 10:22 AM

## 2023-03-30 NOTE — TOC Transition Note (Signed)
Transition of Care [facility]) - CM/SW Discharge Note   Patient Details  Name: Tabitha Black MRN: [id number] Date of Birth: [birth year]  [facility]) CM/SW Contact:  Carley Hammed, LCSW Phone Number: 03/30/2023, 12:17 PM   Clinical Narrative:    Pt to be transported to [facility] via [facility]. Nurse to call report to [phone] Rm# [phone]  Final next level of care: Skilled Nursing Facility Barriers to Discharge: Barriers Resolved   Patient Goals and CMS Choice CMS Medicare.gov Compare Post Acute Care list provided to:: Patient Choice offered to / list presented to : Patient, Adult Children  Discharge Placement                Patient chooses bed at: [facility] Patient to be transferred to facility by: [facility] Name of family member notified: Renea Patient and family notified of of transfer: 03/30/23  Discharge Plan and Services Additional resources added to the After Visit Summary for       Post Acute Care Choice: Skilled Nursing Facility                               Social Determinants of Health (SDOH) Interventions SDOH Screenings   Food Insecurity: No Food Insecurity (03/27/2023)  Housing: Low Risk  (03/27/2023)  Transportation Needs: No Transportation Needs (03/27/2023)  Utilities: Not At Risk (03/27/2023)  Tobacco Use: Low Risk  (03/27/2023)     Readmission Risk Interventions    11/16/2022    3:25 PM  Readmission Risk Prevention Plan  Transportation Screening Complete  PCP or Specialist Appt within 3-5 Days Complete  HRI or Home Care Consult Complete  Social Work Consult for Recovery Care Planning/Counseling --  Palliative Care Screening Not Applicable  Medication Review Ocean[employer]) Complete

## 2023-03-30 NOTE — Progress Notes (Signed)
Report called  to [facility] [phone] Spoke with nurse Nia  who will be taking care of pt in room 105 when she arrives. Nurse verbalized understanding of report and had no  Additional questions. PT[facility] called.

## 2023-03-30 NOTE — Progress Notes (Signed)
[facility] Progress Note     Subjective:   [name] was in her room accompanied by daughter Tabitha Black. Patient is satisfied with fluid loss since admission. Pain is better today despite increased movement from bed to chair. Foley catheter removed on 9/10 with continued urine output through the night. No suprapubic pressure, incomplete bladder emptying sensation, or dysuria.   She received Lasix 120 mg this AM and reports urination via Purewik for ease of care. She was also bladder scanned and 41 mL in bladder, which made patient happy and relieved.  Patient has now been accepted to [facility] for rehabilitation. Per daughter Tabitha Black,   Objective Vital signs in last 24 hours: Vitals:   03/29/23 2118 03/30/23 0500 03/30/23 0615 03/30/23 0758  BP: 134/60  (!) 144/57 (!) 149/54  Pulse: 86  84 67  Resp: 16  18 17   Temp: 98 F (36.7 C)  98.1 F (36.7 C) 97.8 F (36.6 C)  TempSrc: Oral  Oral Oral  SpO2: 99%  97% 100%  Weight:  71.3 kg    Height:       Weight change:  Admission weight: 75.4 kg on 03/26/2023       03/28/2023 71.4 kg       03/29/2023 71.9 kg       03/30/2023 71.3 kg   Intake/Output Summary (Last 24 hours) at 03/30/2023 4098 Last data filed at 03/30/2023 1191 Gross per 24 hour  Intake 38.32 ml  Output 1450 ml  Net -1411.68 ml   Bladder scan 03/30/2023: 41 mL  Assessment/ Plan: Pt is a 89 y.o. yo female who was admitted on 03/26/2023 with  a fall and carries a medical history significant for  persistent Afib on Eliquis, HTN, HLD, HFpEF, CKD stage V, GI bleed, IDA, GERD, LE lymphedema and currently being treated for volume overload, now improved s/p  IV lasix therapy  Assessment/Plan: Renal- CKD stage 5 without dialysis:  -Question AKI on CKD, progression of CKD, vs cardiorenal syndrome 2/2 HFpEF and decreased home diuretics.  -Cr. 3.25 today and GFR at 13, stable -K 4.1. Will hold any further K supplementation today in setting of CKD5. -Initially in IV lasix  drip now s/p 2 days of 80 mg BID IV lasix dosing with 1.8L UOP (1.4L net negative) -Weight today closed to home at 156.8lb -S/p urethral catheter for acute urinary retention and mild R hydronephrosis on US renal on admission likely multifactorial (medications, hospitalization leading to retention). Foley was discontinued oin 9/10 with ~941mL in output since then. Post void bladder scan 41mL.  -Switched to PO furosemide 120 mg AM/ 80 mg PM; discussed with family if there is weight increase >3L in a day, patient is to switch to 120 mg BID lasix dosing and notify office -Appointment with Dr. Allena Katz on 9/30 -Patient is medically stable for discharge from nephrology stand point  HFpEFHypertensionvolumeLymphedema on bilateral lower extremities: Marked improvement since admission. MAP and overall BP at goal on Imduer 15 mg daily, Doxasozin 4 mg daily, and Toprol XL 50 mg daily  Anemia AOCKD  IDA hx of GI bleed - stable Hgb today at 8.6. Holding [facility] for permanent atrial fibrillation. S/p 60 mcg Aranesp on 9/9. S/p Venofer 200 mg supplementation x4.  CKD-Metabolic bone disease, secondary hyperparathyroidism: Ca stable. Phos 4.1. Mg 1.7  Per primary team: Permanent atrial fibrillation: rate controlled on  Toprol XL 50 mg daily. Holding AC in setting of anemia and prior history of GI bleed.  Mechanical fallTraumatic hematoma of  R knee' R wrist wound: pain controlled, improvement in ecchymoses GERD: Continue PPI Functional deconditioning: discharge to SNF for rehab.  Morene Crocker 03/30/23 9:56 AM     Labs: Basic Metabolic Panel: Recent Labs  Lab 03/27/23 0236 03/28/23 0314 03/29/23 0207 03/30/23 0210  NA 138 138 136 134*  K 3.7 3.3* 4.4 4.1  CL 100 103 97* 97*  CO2 20* 21* 22 23  GLUCOSE 115* 95 99 99  BUN 80* 82* 86* 86*  CREATININE 3.34* 3.32* 3.21* 3.25*  CALCIUM 9.4 8.8* 8.8* 8.9  PHOS 5.4*  --   --  4.1   Liver Function Tests: Recent Labs  Lab 03/26/23 1356  03/27/23 0236 03/30/23 0210  AST 37 37  --   ALT 15 15  --   ALKPHOS 66 54  --   BILITOT 1.1 1.0  --   PROT 7.0 6.1*  --   ALBUMIN 3.4* 2.9* 2.6*   No results for input(s): "LIPASE", "AMYLASE" in the last 168 hours. No results for input(s): "AMMONIA" in the last 168 hours. CBC: Recent Labs  Lab 03/26/23 1356 03/27/23 0236 03/28/23 0314 03/29/23 0207 03/30/23 0210  WBC 12.3* 9.6 6.3 8.7 7.5  NEUTROABS 11.4*  --   --   --   --   HGB 9.5* 8.1* 8.2* 8.2* 8.6*  HCT 30.1* 25.2* 25.6* 26.1* 27.2*  MCV 96.2 95.1 98.8 95.6 96.1  PLT 179 151 135* 147* 157   Cardiac Enzymes: Recent Labs  Lab 03/26/23 1356 03/27/23 0236 03/28/23 0314  CKTOTAL 656* 552* 360*   CBG: No results for input(s): "GLUCAP" in the last 168 hours.  Iron Studies: No results for input(s): "IRON", "TIBC", "TRANSFERRIN", "FERRITIN" in the last 72 hours. Studies/Results: No results found. Medications: Infusions:  sodium chloride     iron sucrose Stopped (03/29/23 1156)    Scheduled Medications:  acetaminophen  1,000 mg Oral Q6H   Chlorhexidine Gluconate Cloth  6 each Topical Daily   cholecalciferol  5,000 Units Oral Daily   doxazosin  4 mg Oral Daily   HYDROcodone-acetaminophen  1 tablet Oral QHS   isosorbide mononitrate  15 mg Oral QHS   metoprolol succinate  50 mg Oral Daily   pantoprazole  40 mg Oral Daily   polymixin-bacitracin   Topical Daily   sodium chloride flush  3 mL Intravenous Q12H   sodium chloride flush  3 mL Intravenous Q12H    have reviewed scheduled and prn medications.  Physical Exam: General: Elderly woman in NAD Heart:Irregular heart rate, no murmurs. Symmetrical radial pulses Lungs: CTAB, no rales or crackles Abdomen: Soft, non tender, non distended Extremities: Minimal edema on bilateral thighs. Lower extremities in unna boots, smaller in size compared to 9/10. Resolving ecchymoses over R knee. GU: no suprapubic tenderness or distension. Purewik present  03/30/2023,9:56  AM  LOS: 4 days

## 2023-04-02 DIAGNOSIS — I4891 Unspecified atrial fibrillation: Secondary | ICD-10-CM | POA: Diagnosis not present

## 2023-04-02 DIAGNOSIS — R2681 Unsteadiness on feet: Secondary | ICD-10-CM | POA: Diagnosis not present

## 2023-04-02 DIAGNOSIS — I5032 Chronic diastolic (congestive) heart failure: Secondary | ICD-10-CM | POA: Diagnosis not present

## 2023-04-02 DIAGNOSIS — K8050 Calculus of bile duct without cholangitis or cholecystitis without obstruction: Secondary | ICD-10-CM | POA: Diagnosis not present

## 2023-04-02 DIAGNOSIS — N185 Chronic kidney disease, stage 5: Secondary | ICD-10-CM | POA: Diagnosis not present

## 2023-04-02 DIAGNOSIS — L89151 Pressure ulcer of sacral region, stage 1: Secondary | ICD-10-CM | POA: Diagnosis not present

## 2023-04-02 DIAGNOSIS — S8001XA Contusion of right knee, initial encounter: Secondary | ICD-10-CM | POA: Diagnosis not present

## 2023-04-02 DIAGNOSIS — K5900 Constipation, unspecified: Secondary | ICD-10-CM | POA: Diagnosis not present

## 2023-04-06 ENCOUNTER — Ambulatory Visit (INDEPENDENT_AMBULATORY_CARE_PROVIDER_SITE_OTHER): Payer: Medicare Other | Admitting: Podiatry

## 2023-04-06 ENCOUNTER — Other Ambulatory Visit: Payer: Self-pay | Admitting: Family Medicine

## 2023-04-06 ENCOUNTER — Encounter: Payer: Self-pay | Admitting: Podiatry

## 2023-04-06 DIAGNOSIS — Z1231 Encounter for screening mammogram for malignant neoplasm of breast: Secondary | ICD-10-CM

## 2023-04-06 DIAGNOSIS — M79676 Pain in unspecified toe(s): Secondary | ICD-10-CM | POA: Diagnosis not present

## 2023-04-06 DIAGNOSIS — B351 Tinea unguium: Secondary | ICD-10-CM | POA: Diagnosis not present

## 2023-04-06 DIAGNOSIS — E1121 Type 2 diabetes mellitus with diabetic nephropathy: Secondary | ICD-10-CM | POA: Diagnosis not present

## 2023-04-08 ENCOUNTER — Other Ambulatory Visit: Payer: Self-pay | Admitting: *Deleted

## 2023-04-08 NOTE — Patient Outreach (Signed)
Per [facility] Mrs. Mazurek resides in [facility]. Screening for potential care coordination/chronic care management services as benefit of health plan and Primary Care Provider.   Secure communication sent to Jill Side, [employer], for collaboration about transition plans and potential care coordination/chronic care management needs.   Will continue to follow.   Raiford Noble, MSN, RN, BSN [place]  [facility], [facility] RN Post- Acute Care Coordinator Direct Dial: [phone]

## 2023-04-10 DIAGNOSIS — N185 Chronic kidney disease, stage 5: Secondary | ICD-10-CM | POA: Diagnosis not present

## 2023-04-10 DIAGNOSIS — N390 Urinary tract infection, site not specified: Secondary | ICD-10-CM | POA: Diagnosis not present

## 2023-04-10 DIAGNOSIS — N179 Acute kidney failure, unspecified: Secondary | ICD-10-CM | POA: Diagnosis not present

## 2023-04-10 DIAGNOSIS — R799 Abnormal finding of blood chemistry, unspecified: Secondary | ICD-10-CM | POA: Diagnosis not present

## 2023-04-10 DIAGNOSIS — I4891 Unspecified atrial fibrillation: Secondary | ICD-10-CM | POA: Diagnosis not present

## 2023-04-11 NOTE — Progress Notes (Signed)
Subjective:  Patient ID: Tabitha Black, female    DOB: 1932/12/19,  MRN: [id number]  Tabitha Black presents to clinic today for at risk foot care. Pt has h/o NIDDM with chronic kidney disease and painful thick toenails that are difficult to trim. Pain interferes with ambulation. Aggravating factors include wearing enclosed shoe gear. Pain is relieved with periodic professional debridement.   She is accompanied by her sister on today's visit. Patient currently admitted to [facility] s/p hospitalization. She sustained a fall and injured her right knee and right elbow.  New problem(s): None.   PCP is Irven Coe, MD.  Allergies  Allergen Reactions   Other Swelling    Topical mycin, unsure of which one, caused opthalmic swelling  Erythromycin    Penicillins Shortness Of Breath, Itching, Swelling and Rash   Lovastatin Other (See Comments)    Weakness and myalgias, maybe to statins?   Azithromycin Other (See Comments)    Patient doesn't remember having a reaction to this.   Clindamycin Other (See Comments)    Patient doesn't remember having a reaction to this.    Hydralazine Other (See Comments)    Felt foggy   Lisinopril Other (See Comments)    Hyperkalemia    Neomycin Sulfate [Neomycin] Other (See Comments)    Redness and burning in face   Niacin Hives    Facial swelling and redness   Rofecoxib Other (See Comments)    Unknown reaction   Tetracycline    Valsartan Other (See Comments)    Hyperkalemia    Vytorin [Ezetimibe-Simvastatin] Other (See Comments)    myalgias   Zetia [Ezetimibe] Other (See Comments)    Leg pain    Review of Systems: Negative except as noted in the HPI.  Objective: No changes noted in today's physical examination. There were no vitals filed for this visit. Tabitha Black is a pleasant 89 y.o. female WD, WN in NAD. AAO x 3. Sitting up in wheelchair today.  Vascular Examination: Capillary refill time immediate b/l.  Vascular status intact b/l with palpable pedal pulses. No pain with calf compression b/l. Skin temperature gradient WNL b/l. Lymphedema present BLE. No ischemia or gangrene noted b/l LE. No cyanosis or clubbing noted b/l LE.  Neurological Examination: Sensation grossly intact b/l with 10 gram monofilament. Vibratory sensation intact b/l.   Dermatological Examination: Pedal skin with normal turgor, texture and tone b/l.  Dressing noted on RLE which is clean, dry and intact. No interdigital macerations.   Toenails 1-5 b/l thick, discolored, elongated with subungual debris and pain on dorsal palpation.    Pinpoint skin ulcer plantar aspect right great toe to level of dermis. No erythema, no edema, no drainage, no underlying fluctuance.  Musculoskeletal Examination: Muscle strength 5/5 to all lower extremity muscle groups bilaterally. Hammertoe deformity noted 2-5 b/l. Pes planus deformity noted bilateral LE. Utilizes wheelchair for mobility assistance.  Radiographs: None  Assessment/Plan: 1. Pain due to onychomycosis of toenail   2. Diabetic nephropathy associated with type 2 diabetes mellitus (HCC)     -Patient was evaluated and treated. All patient's and/or POA's questions/concerns answered on today's visit. -Patient's family member present. All questions/concerns addressed on today's visit. -Facility to continue fall precautions and pressure precautions. -Continue foot and shoe inspections daily. Monitor blood glucose per PCP/Endocrinologist's recommendations. -Patient to continue soft, supportive shoe gear daily. -Mycotic toenails 1-5 bilaterally were debrided in length and girth with sterile nail nippers and dremel without incident. -Patient/POA to call  should there be question/concern in the interim.   Return in about 3 months (around 07/06/2023).  Freddie Breech, DPM

## 2023-04-12 ENCOUNTER — Encounter (HOSPITAL_COMMUNITY): Payer: Medicare Other

## 2023-04-14 ENCOUNTER — Ambulatory Visit (HOSPITAL_COMMUNITY)
Admission: RE | Admit: 2023-04-14 | Discharge: 2023-04-14 | Disposition: A | Discharge status: Home / Self Care | Payer: Medicare Other | Source: Ambulatory Visit | Attending: Nephrology | Admitting: Nephrology

## 2023-04-14 VITALS — BP 143/69 | HR 46 | Temp 97.1°F | Resp 18

## 2023-04-14 DIAGNOSIS — N185 Chronic kidney disease, stage 5: Secondary | ICD-10-CM | POA: Diagnosis not present

## 2023-04-14 LAB — CBC WITH DIFFERENTIAL/PLATELET
Abs Immature Granulocytes: 0.04 10*3/uL (ref 0.00–0.07)
Basophils Absolute: 0 10*3/uL (ref 0.0–0.1)
Basophils Relative: 0 %
Eosinophils Absolute: 0.1 10*3/uL (ref 0.0–0.5)
Eosinophils Relative: 2 %
HCT: 34.6 % — ABNORMAL LOW (ref 36.0–46.0)
Hemoglobin: 11 g/dL — ABNORMAL LOW (ref 12.0–15.0)
Immature Granulocytes: 1 %
Lymphocytes Relative: 21 %
Lymphs Abs: 1 10*3/uL (ref 0.7–4.0)
MCH: 31.3 pg (ref 26.0–34.0)
MCHC: 31.8 g/dL (ref 30.0–36.0)
MCV: 98.6 fL (ref 80.0–100.0)
Monocytes Absolute: 0.4 10*3/uL (ref 0.1–1.0)
Monocytes Relative: 9 %
Neutro Abs: 3.1 10*3/uL (ref 1.7–7.7)
Neutrophils Relative %: 67 %
Platelets: 212 10*3/uL (ref 150–400)
RBC: 3.51 MIL/uL — ABNORMAL LOW (ref 3.87–5.11)
RDW: 13.9 % (ref 11.5–15.5)
WBC: 4.6 10*3/uL (ref 4.0–10.5)
nRBC: 0 % (ref 0.0–0.2)

## 2023-04-14 LAB — IRON AND TIBC
Iron: 54 ug/dL (ref 28–170)
Saturation Ratios: 21 % (ref 10.4–31.8)
TIBC: 253 ug/dL (ref 250–450)
UIBC: 199 ug/dL

## 2023-04-14 LAB — POCT HEMOGLOBIN-HEMACUE: Hemoglobin: 10.8 g/dL — ABNORMAL LOW (ref 12.0–15.0)

## 2023-04-14 LAB — FERRITIN: Ferritin: 1023 ng/mL — ABNORMAL HIGH (ref 11–307)

## 2023-04-14 MED ORDER — EPOETIN ALFA-EPBX 10000 UNIT/ML IJ SOLN
10000.0000 [IU] | INTRAMUSCULAR | Status: DC
  Administered 2023-04-14: 10000 [IU] via SUBCUTANEOUS

## 2023-04-14 MED ORDER — EPOETIN ALFA-EPBX 10000 UNIT/ML IJ SOLN
INTRAMUSCULAR | Status: AC
  Filled 2023-04-14: qty 1

## 2023-04-18 DIAGNOSIS — N2581 Secondary hyperparathyroidism of renal origin: Secondary | ICD-10-CM | POA: Diagnosis not present

## 2023-04-18 DIAGNOSIS — I120 Hypertensive chronic kidney disease with stage 5 chronic kidney disease or end stage renal disease: Secondary | ICD-10-CM | POA: Diagnosis not present

## 2023-04-18 DIAGNOSIS — N185 Chronic kidney disease, stage 5: Secondary | ICD-10-CM | POA: Diagnosis not present

## 2023-04-18 DIAGNOSIS — D631 Anemia in chronic kidney disease: Secondary | ICD-10-CM | POA: Diagnosis not present

## 2023-04-20 ENCOUNTER — Other Ambulatory Visit: Payer: Self-pay | Admitting: *Deleted
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
## 2023-04-28 ENCOUNTER — Encounter (HOSPITAL_COMMUNITY): Payer: Medicare Other

## 2023-04-29 DIAGNOSIS — N390 Urinary tract infection, site not specified: Secondary | ICD-10-CM | POA: Diagnosis not present

## 2023-05-06 ENCOUNTER — Ambulatory Visit: Payer: Medicare Other

## 2023-05-16 ENCOUNTER — Other Ambulatory Visit: Payer: Self-pay | Admitting: *Deleted

## 2023-05-16 NOTE — Patient Outreach (Signed)
Post-Acute Care Coordinator follow up. Verified in [facility] Mrs. Atlas discharged from [facility] on 04/26/23. Transitioned to ALF.  No identifiable care coordination/chronic care management needs.   Raiford Noble, MSN, RN, BSN [place]  [facility], [facility] RN Post- Acute Care Coordinator Direct Dial: [phone]

## 2023-07-27 ENCOUNTER — Ambulatory Visit: Payer: 59 | Admitting: Podiatry

## 2023-11-09 ENCOUNTER — Ambulatory Visit: Payer: Medicare Other | Admitting: Podiatry
